# Patient Record
Sex: Male | Born: 1935 | Race: Black or African American | Hispanic: No | Marital: Married | Smoking: Never smoker
Health system: Southern US, Community
[De-identification: ages and names within clinical notes are randomized; demographics above are authoritative.]

## PROBLEM LIST (undated history)

## (undated) DIAGNOSIS — I10 Essential (primary) hypertension: Secondary | ICD-10-CM

## (undated) DIAGNOSIS — E785 Hyperlipidemia, unspecified: Secondary | ICD-10-CM

## (undated) DIAGNOSIS — Z7902 Long term (current) use of antithrombotics/antiplatelets: Secondary | ICD-10-CM

## (undated) DIAGNOSIS — I059 Rheumatic mitral valve disease, unspecified: Secondary | ICD-10-CM

## (undated) DIAGNOSIS — I499 Cardiac arrhythmia, unspecified: Secondary | ICD-10-CM

## (undated) DIAGNOSIS — I2510 Atherosclerotic heart disease of native coronary artery without angina pectoris: Secondary | ICD-10-CM

## (undated) DIAGNOSIS — C61 Malignant neoplasm of prostate: Secondary | ICD-10-CM

## (undated) DIAGNOSIS — G5600 Carpal tunnel syndrome, unspecified upper limb: Secondary | ICD-10-CM

## (undated) DIAGNOSIS — M1990 Unspecified osteoarthritis, unspecified site: Secondary | ICD-10-CM

## (undated) DIAGNOSIS — M109 Gout, unspecified: Secondary | ICD-10-CM

## (undated) DIAGNOSIS — N189 Chronic kidney disease, unspecified: Secondary | ICD-10-CM

## (undated) DIAGNOSIS — R911 Solitary pulmonary nodule: Secondary | ICD-10-CM

## (undated) HISTORY — PX: PROSTATE BIOPSY: SHX241

## (undated) HISTORY — DX: Cardiac arrhythmia, unspecified: I49.9

## (undated) HISTORY — DX: Malignant neoplasm of prostate: C61

## (undated) HISTORY — DX: Atherosclerotic heart disease of native coronary artery without angina pectoris: I25.10

## (undated) HISTORY — DX: Rheumatic mitral valve disease, unspecified: I05.9

## (undated) HISTORY — DX: Essential (primary) hypertension: I10

## (undated) HISTORY — DX: Gout, unspecified: M10.9

## (undated) HISTORY — DX: Long term (current) use of antithrombotics/antiplatelets: Z79.02

## (undated) HISTORY — PX: COLONOSCOPY: SHX174

## (undated) HISTORY — PX: CORONARY ANGIOPLASTY WITH STENT PLACEMENT: SHX49

## (undated) HISTORY — DX: Chronic kidney disease, unspecified: N18.9

## (undated) HISTORY — DX: Hyperlipidemia, unspecified: E78.5

## (undated) HISTORY — DX: Carpal tunnel syndrome, unspecified upper limb: G56.00

## (undated) HISTORY — DX: Unspecified osteoarthritis, unspecified site: M19.90

---

## 2004-05-22 ENCOUNTER — Other Ambulatory Visit: Payer: Self-pay

## 2004-05-22 ENCOUNTER — Emergency Department: Payer: Self-pay | Admitting: Emergency Medicine

## 2006-12-31 ENCOUNTER — Ambulatory Visit: Payer: Self-pay | Admitting: Unknown Physician Specialty

## 2010-02-15 ENCOUNTER — Ambulatory Visit: Payer: Self-pay | Admitting: Internal Medicine

## 2010-02-15 ENCOUNTER — Emergency Department: Payer: Self-pay | Admitting: Emergency Medicine

## 2013-07-08 ENCOUNTER — Ambulatory Visit: Payer: Self-pay | Admitting: Internal Medicine

## 2013-07-08 LAB — CK TOTAL AND CKMB (NOT AT ARMC): CK, Total: 137 U/L (ref 35–232)

## 2013-07-09 LAB — BASIC METABOLIC PANEL
Anion Gap: 6 — ABNORMAL LOW (ref 7–16)
BUN: 12 mg/dL (ref 7–18)
Calcium, Total: 8.3 mg/dL — ABNORMAL LOW (ref 8.5–10.1)
Creatinine: 1.09 mg/dL (ref 0.60–1.30)
EGFR (African American): >60
EGFR (Non-African Amer.): >60
Glucose: 89 mg/dL (ref 65–99)
Osmolality: 277 (ref 275–301)

## 2013-12-11 DIAGNOSIS — G5620 Lesion of ulnar nerve, unspecified upper limb: Secondary | ICD-10-CM | POA: Insufficient documentation

## 2013-12-11 DIAGNOSIS — G5600 Carpal tunnel syndrome, unspecified upper limb: Secondary | ICD-10-CM | POA: Insufficient documentation

## 2013-12-11 DIAGNOSIS — G609 Hereditary and idiopathic neuropathy, unspecified: Secondary | ICD-10-CM | POA: Insufficient documentation

## 2013-12-11 DIAGNOSIS — I059 Rheumatic mitral valve disease, unspecified: Secondary | ICD-10-CM

## 2013-12-11 DIAGNOSIS — I10 Essential (primary) hypertension: Secondary | ICD-10-CM | POA: Insufficient documentation

## 2013-12-11 DIAGNOSIS — M1990 Unspecified osteoarthritis, unspecified site: Secondary | ICD-10-CM | POA: Insufficient documentation

## 2013-12-11 HISTORY — DX: Rheumatic mitral valve disease, unspecified: I05.9

## 2013-12-11 HISTORY — DX: Essential (primary) hypertension: I10

## 2014-01-27 DIAGNOSIS — Z7902 Long term (current) use of antithrombotics/antiplatelets: Secondary | ICD-10-CM | POA: Insufficient documentation

## 2014-01-27 DIAGNOSIS — I2510 Atherosclerotic heart disease of native coronary artery without angina pectoris: Secondary | ICD-10-CM

## 2014-01-27 HISTORY — DX: Atherosclerotic heart disease of native coronary artery without angina pectoris: I25.10

## 2014-07-29 DIAGNOSIS — E785 Hyperlipidemia, unspecified: Secondary | ICD-10-CM | POA: Insufficient documentation

## 2014-11-12 ENCOUNTER — Encounter: Payer: Self-pay | Admitting: Radiation Oncology

## 2014-11-12 NOTE — Progress Notes (Signed)
GU Location of Tumor / Histology: prostatic adenocarcinoma  If Prostate Cancer, Gleason Score is (3 + 3) and PSA is (6.91)  Campbell Lerner presented February 2016 with an elevated PSA.  Biopsies of prostate (if applicable) revealed:    Past/Anticipated interventions by urology, if any: biopsy; encouraged active surveillance. Referred to radiation oncology.  Past/Anticipated interventions by medical oncology, if any: no  Weight changes, if any: no  Bowel/Bladder complaints, if any: nocturia   Nausea/Vomiting, if any: no  Pain issues, if any:  no  SAFETY ISSUES:  Prior radiation? no  Pacemaker/ICD? no  Possible current pregnancy? no  Is the patient on methotrexate? no  Current Complaints / other details:  81 year old male. Prostate volume 74 cc. "Five family members have died of cancer." NKDA. Low risk disease. [place] encouraged active surveillance but, patient concerned about spread.

## 2014-11-14 DIAGNOSIS — C61 Malignant neoplasm of prostate: Secondary | ICD-10-CM | POA: Insufficient documentation

## 2014-11-14 NOTE — Progress Notes (Signed)
Radiation Oncology         [phone] ________________________________  Initial outpatient Consultation  Name: Andrew [name] MRN: [id number]  Date: 11/15/2014  DOB: [birth year]  PQ:[id number], Andrew Rinks, MD  Andrew Rhodes, MD   REFERRING PHYSICIAN: [name], MD  DIAGNOSIS: 81 y.o. gentleman with stage T1c adenocarcinoma of the prostate with a Gleason's score of 3+3 and a PSA of 6.91    ICD-9-CM ICD-10-CM   1. Malignant neoplasm of prostate Andrew [name] is a 81 y.o. gentleman.  He was noted to have an elevated PSA of 6.91 by his primary care physician and urologist in [place].  He was encouraged to consider ongoing surveillance, but, due to a strong family history of cancer he wanted a second opinion.  Accordingly, he was was seen for evaluation in urology by Dr. Karsten Ro and  digital rectal examination was performed at that time revealed no nodules.  The patient proceeded to transrectal ultrasound with 12 biopsies of the prostate on 10/13/14.  The prostate volume measured 74 cc.  Out of 12 core biopsies, 2 were positive.  The maximum Gleason score was 3+3, and this was seen in 5% and less than 5% of the left base and right apex respectively.  The patient reviewed the biopsy results with his urologist and he has kindly been referred today for discussion of potential radiation treatment options.  PREVIOUS RADIATION THERAPY: No  PAST MEDICAL HISTORY:  has a past medical history of Prostate cancer; Encounter for long-term (current) use of antiplatelets/antithrombotics; Coronary atherosclerosis of native coronary artery; Carpal tunnel syndrome; Gout; Hyperlipidemia; Hypertension; Mitral valve disorder; and Osteoarthritis.    PAST SURGICAL HISTORY: Past Surgical History  Procedure Laterality Date  . Coronary angioplasty with stent placement    . Colonoscopy    . Prostate biopsy      FAMILY HISTORY: family history includes Cancer in his mother and  sister.  SOCIAL HISTORY:  reports that he has never smoked. He has never used smokeless tobacco. He reports that he does not drink alcohol or use illicit drugs.  ALLERGIES: Review of patient's allergies indicates no known allergies.  MEDICATIONS:  Current Outpatient Prescriptions  Medication Sig Dispense Refill  . aspirin 81 MG tablet Take 325 mg by mouth daily.     . naproxen sodium (ANAPROX) 220 MG tablet Take 220 mg by mouth 2 (two) times daily with a meal.     No current facility-administered medications for this encounter.    REVIEW OF SYSTEMS:  A 15 point review of systems is documented in the electronic medical record. This was obtained by the nursing staff. However, I reviewed this with the patient to discuss relevant findings and make appropriate changes.  A comprehensive review of systems was negative..  The patient completed an IPSS and IIEF questionnaire.  His IPSS score was 13 indicating moderate urinary outflow obstructive symptoms.  He indicated that his erectile function is is able to complete sexual activity on most attempts.   PHYSICAL EXAM: This patient is in no acute distress.  He is alert and oriented.   height is 5\' 6"  (1.676 m) and weight is 169 lb 9.6 oz (76.93 kg). His oral temperature is 98 F (36.7 C). His blood pressure is 152/65 and his pulse is 65. His respiration is 16 and oxygen saturation is 100%.  He exhibits no respiratory distress or labored breathing.  He appears neurologically intact.  His mood is pleasant.  His  affect is appropriate.  Please note the digital rectal exam findings described above.  KPS = 100  100 - Normal; no complaints; no evidence of disease. 90   - Able to carry on normal activity; minor signs or symptoms of disease. 80   - Normal activity with effort; some signs or symptoms of disease. 50   - Cares for self; unable to carry on normal activity or to do active work. 60   - Requires occasional assistance, but is able to care for most of  his personal needs. 50   - Requires considerable assistance and frequent medical care. 53   - Disabled; requires special care and assistance. 33   - Severely disabled; hospital admission is indicated although death not imminent. 59   - Very sick; hospital admission necessary; active supportive treatment necessary. 10   - Moribund; fatal processes progressing rapidly. 0     - Dead  Karnofsky DA, Abelmann WH, Craver LS and Burchenal JH [phone]) The use of the nitrogen mustards in the palliative treatment of carcinoma: with particular reference to bronchogenic carcinoma Cancer 1 634-56   LABORATORY DATA:  No results found for: WBC, HGB, HCT, MCV, PLT No results found for: NA, K, CL, CO2 No results found for: ALT, AST, GGT, ALKPHOS, BILITOT   RADIOGRAPHY: No results found.    IMPRESSION: This gentleman is a 81 y.o. gentleman with stage T1c adenocarcinoma of the prostate with a Gleason's score of 3+3 and a PSA of 6.91.  His T-Stage, Gleason's Score, and PSA put him into the favorable risk group.  Accordingly he is eligible for a variety of potential treatment options including active surveillance, which may be the preferred approach versus radiotherapy with IMRT or seed implant.  He is not an ideal seed candidate given his 77 cc gland.  PLAN:Today I reviewed the findings and workup thus far.  We discussed the natural history of prostate cancer.  We reviewed the the implications of T-stage, Gleason's Score, and PSA on decision-making and outcomes in prostate cancer.  We discussed radiation treatment in the management of prostate cancer with regard to the logistics and delivery of external beam radiation treatment as well as the logistics and delivery of prostate brachytherapy.  We compared and contrasted each of these approaches and also compared these against prostatectomy.  The patient expressed interest in ongoing surveillance with possible Proscar or Avodart therapy.  If he elects to pursue treatment  later, he may be interested in prostate brachytherapy warranting some downsizing of the gland.   The patient would like to proceed with possible 5-ARI therapy now and follow-up with Dr. Karsten Ro.  I will share my findings with Dr. Karsten Ro.  I enjoyed meeting with him today, and will look forward to participating in the care of this very nice gentleman.   I spent 60 minutes face to face with the patient and more than 50% of that time was spent in counseling and/or coordination of care.   ------------------------------------------------  Sheral Apley. Tammi Klippel, M.D.

## 2014-11-15 ENCOUNTER — Encounter: Payer: Self-pay | Admitting: Radiation Oncology

## 2014-11-15 ENCOUNTER — Ambulatory Visit
Admission: RE | Admit: 2014-11-15 | Discharge: 2014-11-15 | Disposition: A | Discharge status: Home / Self Care | Payer: Medicare Other | Source: Ambulatory Visit | Attending: Radiation Oncology | Admitting: Radiation Oncology

## 2014-11-15 VITALS — BP 152/65 | HR 65 | Temp 98.0°F | Resp 16 | Ht 66.0 in | Wt 169.6 lb

## 2014-11-15 DIAGNOSIS — C61 Malignant neoplasm of prostate: Secondary | ICD-10-CM

## 2014-11-15 NOTE — Progress Notes (Signed)
See progress note under physician encounter. 

## 2014-11-15 NOTE — Progress Notes (Signed)
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 Has a significant other but, they reside separately. Reports his nephew is receiving chemotherapy for stage 4 prostate cancer. Also, he reports his niece died of cancer. IPSS 13. Biggest complaint is nocturia. Vitals stable. Reports taking advil, aspirin, two bp medications and a cholesterol med. Patient unsure of the name of the bp and cholesterol med.

## 2015-05-16 ENCOUNTER — Encounter: Payer: Self-pay | Admitting: Radiation Oncology

## 2015-05-16 ENCOUNTER — Ambulatory Visit: Payer: Medicare Other

## 2015-05-16 ENCOUNTER — Ambulatory Visit
Admission: RE | Admit: 2015-05-16 | Discharge: 2015-05-16 | Disposition: A | Discharge status: Home / Self Care | Payer: Medicare Other | Source: Ambulatory Visit | Attending: Radiation Oncology | Admitting: Radiation Oncology

## 2015-05-16 ENCOUNTER — Telehealth: Payer: Self-pay | Admitting: Radiation Oncology

## 2015-05-16 ENCOUNTER — Ambulatory Visit: Admission: RE | Admit: 2015-05-16 | Payer: Medicare Other | Source: Ambulatory Visit | Admitting: Radiation Oncology

## 2015-05-16 NOTE — Progress Notes (Signed)
GU Location of Tumor / Histology: prostatic adenocarcinoma  If Prostate Cancer, Gleason Score is (3 + 3) and PSA is (6.91)  Campbell Lerner presented February 2016 with an elevated PSA.  Biopsies of prostate (if applicable) revealed:    Past/Anticipated interventions by urology, if any: biopsy; encouraged active surveillance. Referred to radiation oncology.  Past/Anticipated interventions by medical oncology, if any: no  Weight changes, if any: no  Bowel/Bladder complaints, if any: nocturia   Nausea/Vomiting, if any: no  Pain issues, if any: no  SAFETY ISSUES:  Prior radiation? no  Pacemaker/ICD? no  Possible current pregnancy? no  Is the patient on methotrexate? no  Current Complaints / other details: 81 year old male. Prostate volume 74 cc. "Five family members have died of cancer." NKDA. Low risk disease.   Consulted by Dr. Tammi Klippel on 11/15/14. Returned to [place] for finasteride (started on 01/28/15). Prostate volume now reduced from 74 cc to 60 cc. Referred back to Dr. Tammi Klippel for consideration of seed implantation.

## 2015-05-16 NOTE — Telephone Encounter (Signed)
Patient has not shown for 1230 appointment. Phoned home number. No answer. Left message requesting return call to reschedule.

## 2015-05-17 ENCOUNTER — Encounter: Payer: Self-pay | Admitting: *Deleted

## 2015-05-17 ENCOUNTER — Telehealth: Payer: Self-pay | Admitting: *Deleted

## 2015-05-17 NOTE — Telephone Encounter (Signed)
Mailed no show letter today

## 2015-06-29 ENCOUNTER — Encounter: Payer: Self-pay | Admitting: *Deleted

## 2015-06-29 DIAGNOSIS — K635 Polyp of colon: Secondary | ICD-10-CM | POA: Insufficient documentation

## 2015-07-01 ENCOUNTER — Ambulatory Visit (INDEPENDENT_AMBULATORY_CARE_PROVIDER_SITE_OTHER): Payer: Medicare Other | Admitting: Obstetrics and Gynecology

## 2015-07-01 ENCOUNTER — Encounter: Payer: Self-pay | Admitting: Obstetrics and Gynecology

## 2015-07-01 VITALS — BP 158/84 | HR 63 | Ht 69.0 in | Wt 167.2 lb

## 2015-07-01 DIAGNOSIS — R972 Elevated prostate specific antigen [PSA]: Secondary | ICD-10-CM

## 2015-07-01 NOTE — Progress Notes (Signed)
07/01/2015 11:12 AM   Campbell Lerner 1933-07-03 [id number]  Referring provider: [name], MD [address] [facility] [place], [place] [address]  Chief Complaint  Patient presents with  . Elevated PSA    38yr f/u    HPI: Patient is a 81 year old African-American male presenting today for scheduled recheck on elevated PSA and BPH. His previous PSAs with [place] were stable at 5.8. He was last seen 1 year ago. He states that he followed up with his primary care provider after his last visit and his PSA was rechecked. It was found to be higher than his previous and he was referred to [facility] where he saw Dr. Adah Salvage. A prostate biopsy was performed positive for low risk prostate cancer. Per Dr. Adah Salvage notes patient was referred to radiation oncology.  Patient states that he went on vacation to [place] and has not been in touch with [facility] over the last month. Patient was supposed to follow up with radiation oncology but states that he has been in [place] and has not heard anything about appointment.  During our conversation it seems as though active surveillance has been discussed with patient in the past but he has high anxiety concerning the possible development of metastatic disease because his nephew has stage IV prostate cancer.  He states that he cannot understand highly would delay treatment mainly noted that there is cancer there.  10/13/14 TRUS/BX Prostate measured 74cc with no definite abnormality noted Pathology: Adenocarcinoma Gleason 3+3=6 in 2 cores 5% & <5% (low risk).  Stage T1c Prostate downsizing: started on Finasteride 01/28/15.  PSA History: 2/15      PSA 5.9 7/15      PSA 5.9  11/15    PSA 5.8  09/10/14 PSA 6.91 Started Finasteride 05/03/15 PSA 4.59  PMH: Past Medical History  Diagnosis Date  . Prostate cancer ([place])   . Encounter for long-term (current) use of antiplatelets/antithrombotics   . Coronary atherosclerosis of native coronary  artery   . Carpal tunnel syndrome   . Gout   . Hyperlipidemia   . Hypertension   . Mitral valve disorder   . Osteoarthritis     Surgical History: Past Surgical History  Procedure Laterality Date  . Coronary angioplasty with stent placement    . Colonoscopy    . Prostate biopsy      Home Medications:    Medication List       This list is accurate as of: 07/01/15 11:12 AM.  Always use your most recent med list.               amLODipine 10 MG tablet  Commonly known as:  NORVASC  TAKE 1 TABLET (10 MG TOTAL) BY MOUTH ONCE DAILY.     aspirin 81 MG tablet  Take 325 mg by mouth daily.     atorvastatin 40 MG tablet  Commonly known as:  LIPITOR  Take 40 mg by mouth daily.     clopidogrel 75 MG tablet  Commonly known as:  PLAVIX  Take 75 mg by mouth daily.     finasteride 5 MG tablet  Commonly known as:  PROSCAR  Take 5 mg by mouth daily.     metoprolol 50 MG tablet  Commonly known as:  LOPRESSOR  Take 50 mg by mouth 2 (two) times daily.     naproxen sodium 220 MG tablet  Commonly known as:  ANAPROX  Take 220 mg by mouth 2 (two) times daily with  a meal.        Allergies: No Known Allergies  Family History: Family History  Problem Relation Age of Onset  . Cancer Mother     stomach and uterus  . Cancer Sister     breast    Social History:  reports that he has never smoked. He has never used smokeless tobacco. He reports that he does not drink alcohol or use illicit drugs.  ROS: UROLOGY Frequent Urination?: No Hard to postpone urination?: No Burning/pain with urination?: No Get up at night to urinate?: Yes Leakage of urine?: No Urine stream starts and stops?: No Trouble starting stream?: No Do you have to strain to urinate?: No Blood in urine?: No Urinary tract infection?: No Sexually transmitted disease?: No Injury to kidneys or bladder?: No Painful intercourse?: No Weak stream?: No Erection problems?: Yes Penile pain?:  No  Gastrointestinal Nausea?: No Vomiting?: No Indigestion/heartburn?: No Diarrhea?: No Constipation?: No  Constitutional Fever: No Night sweats?: No Weight loss?: Yes Fatigue?: No  Skin Skin rash/lesions?: No Itching?: No  Eyes Blurred vision?: No Double vision?: No  Ears/Nose/Throat Sore throat?: No Sinus problems?: No  Hematologic/Lymphatic Swollen glands?: No Easy bruising?: No  Cardiovascular Leg swelling?: No Chest pain?: No  Respiratory Cough?: No Shortness of breath?: No  Endocrine Excessive thirst?: No  Musculoskeletal Back pain?: No Joint pain?: No  Neurological Headaches?: No Dizziness?: No  Psychologic Depression?: No Anxiety?: No  Physical Exam: BP 158/84 mmHg  Pulse 63  Ht 5\' 9"  (1.753 m)  Wt 167 lb 3.2 oz (75.841 kg)  BMI 24.68 kg/m2  Constitutional:  Alert and oriented, No acute distress. HEENT: [place] AT, moist mucus membranes.  Trachea midline, no masses. Cardiovascular: No clubbing, cyanosis, or edema. Respiratory: Normal respiratory effort, no increased work of breathing. Skin: No rashes, bruises or suspicious lesions. Lymph: No cervical adenopathy. Neurologic: Grossly intact, no focal deficits, moving all 4 extremities. Psychiatric: Normal mood and affect.  Laboratory Data:   Urinalysis No results found for: COLORURINE, APPEARANCEUR, LABSPEC, PHURINE, GLUCOSEU, HGBUR, BILIRUBINUR, KETONESUR, PROTEINUR, UROBILINOGEN, NITRITE, LEUKOCYTESUR  Pertinent Imaging:   Assessment & Plan:    1. Prostate Cancer- Adenocarcinoma Gleason 3+3=6 in 2 cores 5% & <5% (low risk).  Stage T1c  PSA history  2/15      PSA 5.9  7/15      PSA 5.9   11/15    PSA 5.8   09/10/14 PSA 6.91 Started Finasteride  05/03/15 PSA 4.59 - PSA  I further discussed with patient that his cancer is considered low risk. He continues to have difficulty understanding the option of active surveillance.  I advised him that we would be happy to assist him with  treatment of his prostate cancer but that I would like him to further discuss this with Dr. Erlene Quan so that he completely understands the risk and benefits of treatment. He is also willing to follow up at the [facility] as needed for radiation oncology consultation.  He was provided patient information concerning prostate cancer and potential treatment options.  2. BPH- No urinary symptoms.  Return for f/u with Dr. Erlene Quan to discuss prostate cancer treatment options.  These notes generated with voice recognition software. I apologize for typographical errors.  Herbert Moors, [facility] [address] [place],  [address] [phone]

## 2015-07-02 LAB — PSA: PROSTATE SPECIFIC AG, SERUM: 3 ng/mL (ref 0.0–4.0)

## 2015-07-18 ENCOUNTER — Ambulatory Visit (INDEPENDENT_AMBULATORY_CARE_PROVIDER_SITE_OTHER): Payer: Medicare Other | Admitting: Urology

## 2015-07-18 ENCOUNTER — Encounter: Payer: Self-pay | Admitting: Urology

## 2015-07-18 VITALS — BP 184/89 | HR 54 | Ht 69.0 in | Wt 167.6 lb

## 2015-07-18 DIAGNOSIS — C61 Malignant neoplasm of prostate: Secondary | ICD-10-CM

## 2015-07-18 DIAGNOSIS — N400 Enlarged prostate without lower urinary tract symptoms: Secondary | ICD-10-CM | POA: Diagnosis not present

## 2015-07-18 NOTE — Progress Notes (Signed)
1:30 PM  07/18/2015  CAYNE KLESS 1932-10-01 [id number]  Referring provider: [name], MD [address] [facility] [place], [place] [address]  Chief Complaint  Patient presents with  . Follow-up    discuss treatment Prostate Cancer     HPI: 81 year old African-American male with low volume, low risk prostate cancer. He underwent prostate biopsy by Dr. Karsten Ro [facility] on 10/13/2014 revealing Gleason 3+3 involving 2 of 12 cores (up to 5%).  PSA history as below. No palpable nodule on exam, TRUS vol 74 cc (prior to starting finasteride).  Since his diagnosis, he has seen radiation oncology (Dr. Tammi Klippel) and our NP Lanier Clam along with Dr. Karsten Ro who will continue to recommend active surveillance is the best treatment course/ preferred approach.  He returns today to our office for yet another opinion.  He is extremely anxious about his prostate cancer diagnosis and continues to want intervention/treatment despite the recommendations for active surveillance.  He explains that he has many family members with malignancies other than prostate cancer and is concerned cancer runs in his family. He does have a nephew with prostate cancer.  He states today that he plans on living for very long time and does indeed have an excellent performance status.   10/13/14 TRUS/BX Prostate measured 74cc with no definite abnormality noted Pathology: Adenocarcinoma Gleason 3+3=6 in 2 cores 5% & <5% (low risk).  Stage T1c Prostate downsizing: started on Finasteride 01/28/15.  PSA History: 2/15      PSA 5.9 7/15      PSA 5.9  11/15    PSA 5.8  09/10/14 PSA 6.91 Started Finasteride 05/03/15 PSA 4.59 11/2016PSA 3.0  PMH: Past Medical History  Diagnosis Date  . Prostate cancer ([place])   . Encounter for long-term (current) use of antiplatelets/antithrombotics   . Coronary atherosclerosis of native coronary artery   . Carpal tunnel syndrome   . Gout   . Hyperlipidemia   .  Hypertension   . Mitral valve disorder   . Osteoarthritis   . Arteriosclerosis of coronary artery 01/27/2014  . Benign essential HTN 12/11/2013  . Disorder of mitral valve 12/11/2013    Surgical History: Past Surgical History  Procedure Laterality Date  . Coronary angioplasty with stent placement    . Colonoscopy    . Prostate biopsy      Home Medications:    Medication List       This list is accurate as of: 07/18/15  1:30 PM.  Always use your most recent med list.               amLODipine 10 MG tablet  Commonly known as:  NORVASC  TAKE 1 TABLET (10 MG TOTAL) BY MOUTH ONCE DAILY.     aspirin 81 MG tablet  Take 325 mg by mouth daily.     atorvastatin 40 MG tablet  Commonly known as:  LIPITOR  Take 40 mg by mouth daily.     clopidogrel 75 MG tablet  Commonly known as:  PLAVIX  Take 75 mg by mouth daily.     finasteride 5 MG tablet  Commonly known as:  PROSCAR  Take 5 mg by mouth daily.     metoprolol 50 MG tablet  Commonly known as:  LOPRESSOR  Take 50 mg by mouth 2 (two) times daily.     naproxen sodium 220 MG tablet  Commonly known as:  ANAPROX  Take 220 mg by mouth 2 (two) times daily with a meal.  Allergies: No Known Allergies  Family History: Family History  Problem Relation Age of Onset  . Cancer Mother     stomach and uterus  . Cancer Sister     breast    Social History:  reports that he has never smoked. He has never used smokeless tobacco. He reports that he does not drink alcohol or use illicit drugs.  ROS: UROLOGY Frequent Urination?: No Hard to postpone urination?: No Burning/pain with urination?: No Get up at night to urinate?: Yes Leakage of urine?: No Urine stream starts and stops?: No Trouble starting stream?: No Do you have to strain to urinate?: No Blood in urine?: No Urinary tract infection?: No Sexually transmitted disease?: No Injury to kidneys or bladder?: No Painful intercourse?: No Weak stream?: No Erection  problems?: Yes Penile pain?: No  Gastrointestinal Nausea?: No Vomiting?: No Indigestion/heartburn?: No Diarrhea?: No Constipation?: No  Constitutional Fever: No Night sweats?: No Weight loss?: Yes Fatigue?: No  Skin Skin rash/lesions?: No Itching?: No  Eyes Blurred vision?: Yes Double vision?: No  Ears/Nose/Throat Sore throat?: No Sinus problems?: No  Hematologic/Lymphatic Swollen glands?: No Easy bruising?: No  Cardiovascular Leg swelling?: No Chest pain?: No  Respiratory Cough?: No Shortness of breath?: No  Endocrine Excessive thirst?: No  Musculoskeletal Back pain?: No Joint pain?: No  Neurological Headaches?: No Dizziness?: No  Psychologic Depression?: No Anxiety?: No  Physical Exam: BP 184/89 mmHg  Pulse 54  Ht 5\' 9"  (1.753 m)  Wt 167 lb 9.6 oz (76.023 kg)  BMI 24.74 kg/m2  Constitutional:  Alert and oriented, No acute distress. HEENT: [place] AT, moist mucus membranes.  Trachea midline, no masses. Cardiovascular: No clubbing, cyanosis, or edema. Respiratory: Normal respiratory effort, no increased work of breathing. Skin: No rashes, bruises or suspicious lesions. Neurologic: Grossly intact, no focal deficits, moving all 4 extremities. Psychiatric: Normal mood and affect. Rectal exam deferred today, performed just a few months ago at [facility]   Assessment & Plan:    1. Prostate Cancer: 81 year old male diagnosed on 10/2014 with very low risk prostate cancer, Gleason 3+3 involving only to 4 courses very low volume, T1c in 74 cc prostate.  His PSA has come down appropriately with the addition of finasteride.  We had a lengthy discussion today regarding the natural history of prostate cancer. I explained to him that he is not a surgical candidate given his age and likelihood of developing severe incontinence, erectile dysfunction, and other major complications.  He may be a candidate for radiation including external beam and now  possibly seed implant if his prostate size has responded to the finasteride.    I discussed that I highly recommend active surveillance is the preferred treatment and agree with all other consultants in this regard. I do feel that given his very low volume of disease, that it is clinically insignificant and can be easily followed. I explained that treatment will likely be more harmful than the cancer itself and per the Hippocratic oath, have pledged to first do no harm.    He continues to feel that he would like to be treated despite our recommendations. I will send him for a second opinion with radiation oncology with Dr. Noreene Filbert.  -referral to radiation oncology -RTC for PSA/ DRE q4-6 months per active surveillance protocol  2. BPH- No urinary symptoms.  Return in about 6 months (around 01/16/2016) for PSA/ DRE.   Hollice Espy, MD  [facility] [address], [place], [place] [address] [phone]  [phone]   

## 2015-07-27 ENCOUNTER — Ambulatory Visit: Payer: Medicare Other | Admitting: Radiation Oncology

## 2015-08-09 ENCOUNTER — Telehealth: Payer: Self-pay

## 2015-08-09 NOTE — Telephone Encounter (Signed)
  Oncology Nurse Navigator Documentation    Navigator Encounter Type: Telephone (08/09/15 1300)                      Time Spent with Patient: 15 (08/09/15 1300)   Left message for patient to return call. Has radiation referral and has not returned call for appt.

## 2015-08-11 ENCOUNTER — Telehealth: Payer: Self-pay

## 2015-08-11 NOTE — Telephone Encounter (Signed)
  Oncology Nurse Navigator Documentation    Navigator Encounter Type: Telephone (08/11/15 1000) Patient Visit Type: [id number] (08/11/15 1000)       Interventions: Coordination of Care (08/11/15 1000)            Time Spent with Patient: 15 (08/11/15 1000)   Andrew [name] returned call and appt for consultation with Dr Baruch Gouty was arranged for 08/16/15 at 10:00. Directions to [name] provided as well as my contact information for any further questions or concerns.

## 2015-08-16 ENCOUNTER — Ambulatory Visit
Admission: RE | Admit: 2015-08-16 | Discharge: 2015-08-16 | Disposition: A | Discharge status: Home / Self Care | Payer: Medicare Other | Source: Ambulatory Visit | Attending: Radiation Oncology | Admitting: Radiation Oncology

## 2015-08-16 ENCOUNTER — Encounter: Payer: Self-pay | Admitting: Radiation Oncology

## 2015-08-16 VITALS — BP 143/75 | HR 64 | Temp 96.7°F | Resp 18 | Wt 165.9 lb

## 2015-08-16 DIAGNOSIS — C61 Malignant neoplasm of prostate: Secondary | ICD-10-CM

## 2015-08-16 NOTE — Consult Note (Signed)
Except an outstanding is perfect of Radiation Oncology NEW PATIENT EVALUATION  Name: Andrew [name]  MRN: [id number]  Date:   08/16/2015     DOB: 18-Jul-1933   This 82 y.o. male patient presents to the clinic for initial evaluation of prostate cancer stage I (T1 CN 0 M0) Gleason 6 (3+3) adenocarcinoma the prostate.  REFERRING PHYSICIAN: [name], MD  CHIEF COMPLAINT:  Chief Complaint  Patient presents with  . Prostate Cancer    Pt is here for initial consultation of prostate cancer.      DIAGNOSIS: The encounter diagnosis was Malignant neoplasm of prostate ([place]).   PREVIOUS INVESTIGATIONS:  Pathology report reviewed Bone scan not performed based on low PSA score Clinical notes reviewed  HPI: Patient is a 82 year old male who is had an elevated PSA dating back to early 2015 what was 5.9. In March 2016 he underwent transrectal ultrasound-guided biopsy showing 2 out of 12 cores positive for Gleason 6 (3+3) adenocarcinoma. He has no palpable nodule. He was started on finasteride PSA dropped to the 3 range. He is seen both radiation oncology and urology and recommendation for active surveillance was made. Patient is an excellent overall general condition has multiple family members with cancer and nephew now dealing with stage IV prostate cancer. He has very little symptoms specifically denies urinary frequency urgency or dysuria. I been asked to evaluate the patient for consideration of treatment. He has approximate 74 cc prostate.  PLANNED TREATMENT REGIMEN: I MRT image guided radiation therapy  PAST MEDICAL HISTORY:  has a past medical history of Prostate cancer ([place]); Encounter for long-term (current) use of antiplatelets/antithrombotics; Coronary atherosclerosis of native coronary artery; Carpal tunnel syndrome; Gout; Hyperlipidemia; Hypertension; Mitral valve disorder; Osteoarthritis; Arteriosclerosis of coronary artery (01/27/2014); Benign essential HTN (12/11/2013); and Disorder of  mitral valve (12/11/2013).    PAST SURGICAL HISTORY:  Past Surgical History  Procedure Laterality Date  . Coronary angioplasty with stent placement    . Colonoscopy    . Prostate biopsy      FAMILY HISTORY: family history includes Cancer in his mother and sister.  SOCIAL HISTORY:  reports that he has never smoked. He has never used smokeless tobacco. He reports that he does not drink alcohol or use illicit drugs.  ALLERGIES: Review of patient's allergies indicates no known allergies.  MEDICATIONS:  Current Outpatient Prescriptions  Medication Sig Dispense Refill  . amLODipine (NORVASC) 10 MG tablet TAKE 1 TABLET (10 MG TOTAL) BY MOUTH ONCE DAILY.  11  . aspirin 81 MG tablet Take 325 mg by mouth daily.     Marland Kitchen atorvastatin (LIPITOR) 40 MG tablet Take 40 mg by mouth daily.  6  . clopidogrel (PLAVIX) 75 MG tablet Take 75 mg by mouth daily.  5  . finasteride (PROSCAR) 5 MG tablet Take 5 mg by mouth daily.  11  . hydrochlorothiazide (HYDRODIURIL) 25 MG tablet Take by mouth.    . metoprolol (LOPRESSOR) 50 MG tablet Take 50 mg by mouth 2 (two) times daily.  6  . naproxen sodium (ANAPROX) 220 MG tablet Take 220 mg by mouth 2 (two) times daily with a meal.    . sildenafil (REVATIO) 20 MG tablet Take by mouth.     No current facility-administered medications for this encounter.    ECOG PERFORMANCE STATUS:  0 - Asymptomatic  REVIEW OF SYSTEMS:  Patient denies any weight loss, fatigue, weakness, fever, chills or night sweats. Patient denies any loss of vision, blurred vision. Patient denies any ringing  of the ears or hearing loss. No irregular heartbeat. Patient denies heart murmur or history of fainting. Patient denies any chest pain or pain radiating to her upper extremities. Patient denies any shortness of breath, difficulty breathing at night, cough or hemoptysis. Patient denies any swelling in the lower legs. Patient denies any nausea vomiting, vomiting of blood, or coffee ground material in  the vomitus. Patient denies any stomach pain. Patient states has had normal bowel movements no significant constipation or diarrhea. Patient denies any dysuria, hematuria or significant nocturia. Patient denies any problems walking, swelling in the joints or loss of balance. Patient denies any skin changes, loss of hair or loss of weight. Patient denies any excessive worrying or anxiety or significant depression. Patient denies any problems with insomnia. Patient denies excessive thirst, polyuria, polydipsia. Patient denies any swollen glands, patient denies easy bruising or easy bleeding. Patient denies any recent infections, allergies or URI. Patient "s visual fields have not changed significantly in recent time.    PHYSICAL EXAM: BP 143/75 mmHg  Pulse 64  Temp(Src) 96.7 F (35.9 C)  Resp 18  Wt 165 lb 14.3 oz (75.25 kg) Well-developed well-nourished male looks much younger than stated age On rectal exam rectal sphincter tone is good. Prostate is smooth contracted without evidence of nodularity or mass. Sulcus is preserved bilaterally. No discrete nodularity is identified. No other rectal abnormalities are noted. Gland measures approximate 70 cc. Well-developed well-nourished patient in NAD. HEENT reveals PERLA, EOMI, discs not visualized.  Oral cavity is clear. No oral mucosal lesions are identified. Neck is clear without evidence of cervical or supraclavicular adenopathy. Lungs are clear to A&P. Cardiac examination is essentially unremarkable with regular rate and rhythm without murmur rub or thrill. Abdomen is benign with no organomegaly or masses noted. Motor sensory and DTR levels are equal and symmetric in the upper and lower extremities. Cranial nerves II through XII are grossly intact. Proprioception is intact. No peripheral adenopathy or edema is identified. No motor or sensory levels are noted. Crude visual fields are within normal range.  LABORATORY DATA: Pathology report reviewed     RADIOLOGY RESULTS: No films for review bone scan not ordered based on low PSA value   IMPRESSION: Stage I adenocarcinoma the prostate in 82 year old male  PLAN: At this time I again expressed the rationale for active surveillance. Based on his concerns about prostate cancer with family members. Large size of his gland and overall age excellent general condition I would recommend image guided I MRT radiation therapy. I would plan on delivering 8000 cGy over 8 weeks. Risks and benefits of radiation including increased lower urinary tract symptoms, possible diarrhea, possible alteration of blood counts, skin reaction fatigue all were explained in detail to the patient. I have asked urology to place gold fiduciary markers for daily image guided treatment. Patient again has adamantly reviewed rejected watchful waiting with active surveillance would like to proceed with treatment.  I would like to take this opportunity for allowing me to participate in the care of your patient.Armstead Peaks., MD

## 2015-08-16 NOTE — Progress Notes (Signed)
  Oncology Nurse Navigator Documentation    Navigator Encounter Type: Telephone (08/16/15 1100) Patient Visit Type: BSWHQP (08/16/15 1100)                    Time Spent with Patient: 15 (08/16/15 1100)   Met with Andrew [name] after he saw Dr Baruch Gouty. Introduced nurse navigator services and provided him with my contact information for any further questions or concerns

## 2015-08-31 ENCOUNTER — Telehealth: Payer: Self-pay

## 2015-08-31 ENCOUNTER — Encounter: Payer: Self-pay | Admitting: Urology

## 2015-08-31 ENCOUNTER — Other Ambulatory Visit: Payer: Medicare Other | Admitting: Urology

## 2015-08-31 NOTE — Telephone Encounter (Signed)
  Oncology Nurse Navigator Documentation    Navigator Encounter Type: Telephone (08/31/15 1100)                                          Time Spent with Patient: 15 (08/31/15 1100)   Radiation notified that Andrew [name] did not show up for his gold markers at [facility] today 08/31/15

## 2015-09-05 ENCOUNTER — Ambulatory Visit: Admission: RE | Admit: 2015-09-05 | Payer: Medicare Other | Source: Ambulatory Visit

## 2015-09-05 ENCOUNTER — Ambulatory Visit
Admission: RE | Admit: 2015-09-05 | Discharge: 2015-09-05 | Disposition: A | Discharge status: Home / Self Care | Payer: Medicare Other | Source: Ambulatory Visit | Attending: Radiation Oncology | Admitting: Radiation Oncology

## 2015-09-09 ENCOUNTER — Telehealth: Payer: Self-pay

## 2015-09-09 NOTE — Telephone Encounter (Signed)
  Oncology Nurse Navigator Documentation  Navigator Location: [facility] (09/09/15 1400) Navigator Encounter Type: Telephone (09/09/15 1400)                                          Time Spent with Patient: 15 (09/09/15 1400)   Attempted to call Andrew [name] to see if he would like to proceed with radiation treatment. No answer/no voicemail.

## 2016-01-20 ENCOUNTER — Ambulatory Visit: Payer: Medicare Other | Admitting: Urology

## 2016-01-20 ENCOUNTER — Telehealth: Payer: Self-pay

## 2016-01-20 NOTE — Telephone Encounter (Signed)
  Oncology Nurse Navigator Documentation  Navigator Location: [facility] (01/20/16 1300) Navigator Encounter Type: Telephone (01/20/16 1300) Telephone: Lahoma Crocker Call;Appt Confirmation/Clarification (01/20/16 1300)                                        Time Spent with Patient: 30 (01/20/16 1300)   Andrew [name] has missed his last two appts for his gold see marker placement for prostate cancer treatment that he wishes to pursue. Contacted Andrew [name] regarding and he states he not talked with anyone in the last 6 months. States he is still interested in having radiation treatment. Instructed that I would get [facility] to make him another appt and I would call him to double confirm his receipt of this appt.

## 2016-01-23 ENCOUNTER — Telehealth: Payer: Self-pay | Admitting: Urology

## 2016-01-23 ENCOUNTER — Encounter: Payer: Self-pay | Admitting: Urology

## 2016-01-23 NOTE — Telephone Encounter (Signed)
-----   Message from Clent Jacks, RN sent at 01/20/2016  1:54 PM EDT ----- Regarding: RE: No show for prostate cancer f/u He states he has not talked to anyone in 6 months. I told him we would get him scheduled again for marker placement and call him with appt. I will call him as well. I think everyone agreed with AS, but he wanted something done. Sharyn Lull could you get him scheduled? ----- Message -----    From: Hollice Espy, MD    Sent: 01/20/2016   9:15 AM      To: Clent Jacks, RN Subject: No show for prostate cancer f/u                This guy no showed today for prostate cancer f/u.  He also no showed in 08/2015 for gold seeds (which is honestly fine because I think he would be better on active surveillance).   I was hoping you could contact him and make sure that he is at least following with 1 of the many MDs he's seen for his cancer.  He does need f/u.  Hollice Espy, MD

## 2016-01-23 NOTE — Telephone Encounter (Signed)
Patient missed his 6 month follow up appt on 01-20-16 with dr. Erlene Quan

## 2016-01-23 NOTE — Telephone Encounter (Signed)
I have scheduled him for 03-02-16 @ 10:45 for and 11:00 appt. I have called and left him a message to call me back. I will mail out the instructions.  Sharyn Lull

## 2016-01-23 NOTE — Telephone Encounter (Signed)
-----   Message from Clent Jacks, RN sent at 01/23/2016 11:40 AM EDT ----- Regarding: RE: No show for prostate cancer f/u He wants to be scheduled again to have gold markers placed ----- Message -----    From: Benard Halsted    Sent: 01/23/2016   8:20 AM      To: Clent Jacks, RN, Hollice Espy, MD Subject: RE: No show for prostate cancer f/u            He no showed for his appointment with Dr Erlene Quan on 01-20-16. We had his follow up appt scheduled the last time he was here in Dec 2016. He was told to come back for a follow up visit in 6 months. That was last Friday. ----- Message -----    From: Clent Jacks, RN    Sent: 01/20/2016   1:54 PM      To: Darcella Cheshire, MD Subject: RE: No show for prostate cancer f/u            He states he has not talked to anyone in 6 months. I told him we would get him scheduled again for marker placement and call him with appt. I will call him as well. I think everyone agreed with AS, but he wanted something done. Sharyn Lull could you get him scheduled? ----- Message -----    From: Hollice Espy, MD    Sent: 01/20/2016   9:15 AM      To: Clent Jacks, RN Subject: No show for prostate cancer f/u                This guy no showed today for prostate cancer f/u.  He also no showed in 08/2015 for gold seeds (which is honestly fine because I think he would be better on active surveillance).   I was hoping you could contact him and make sure that he is at least following with 1 of the many MDs he's seen for his cancer.  He does need f/u.  Hollice Espy, MD

## 2016-01-24 NOTE — Telephone Encounter (Signed)
Called and left another message for the patient to cb to give him his appt information.    Sharyn Lull

## 2016-02-02 ENCOUNTER — Telehealth: Payer: Self-pay

## 2016-02-02 NOTE — Telephone Encounter (Signed)
  Oncology Nurse Navigator Documentation  Navigator Location: [facility] (02/02/16 0900) Navigator Encounter Type: Telephone (02/02/16 0900) Telephone: Lahoma Crocker Call;Appt Confirmation/Clarification (02/02/16 0900)                                        Time Spent with Patient: 15 (02/02/16 0900)   [facility] has been unable to reach Andrew [name] to notify him of his appt for markers. I attempted to call. No answer/no voicemail.

## 2016-02-06 ENCOUNTER — Telehealth: Payer: Self-pay

## 2016-02-06 NOTE — Telephone Encounter (Signed)
  Oncology Nurse Navigator Documentation  Navigator Location: [facility] (02/06/16 1000) Navigator Encounter Type: Telephone (02/06/16 1000) Telephone: Lahoma Crocker Call (02/06/16 1000)                                        Time Spent with Patient: 15 (02/06/16 1000)   Spoke to Andrew [name] on the phone. Notified him that [facility] has been trying to contact him to arrange appt for him. He stated he got letter in mail from them and that he will call them today to schedule appt. Reiterated with him that he will need to call them for an appt.

## 2016-03-02 ENCOUNTER — Other Ambulatory Visit: Payer: Medicare Other | Admitting: Urology

## 2016-05-02 ENCOUNTER — Encounter (INDEPENDENT_AMBULATORY_CARE_PROVIDER_SITE_OTHER): Payer: Self-pay

## 2016-06-04 ENCOUNTER — Encounter (INDEPENDENT_AMBULATORY_CARE_PROVIDER_SITE_OTHER): Payer: Self-pay | Admitting: Vascular Surgery

## 2016-06-04 ENCOUNTER — Ambulatory Visit (INDEPENDENT_AMBULATORY_CARE_PROVIDER_SITE_OTHER): Payer: Medicare Other | Admitting: Vascular Surgery

## 2016-06-04 VITALS — BP 158/95 | HR 93 | Resp 17 | Ht 69.0 in | Wt 156.0 lb

## 2016-06-04 DIAGNOSIS — I10 Essential (primary) hypertension: Secondary | ICD-10-CM

## 2016-06-04 DIAGNOSIS — M1990 Unspecified osteoarthritis, unspecified site: Secondary | ICD-10-CM | POA: Diagnosis not present

## 2016-06-04 DIAGNOSIS — I2510 Atherosclerotic heart disease of native coronary artery without angina pectoris: Secondary | ICD-10-CM

## 2016-06-04 DIAGNOSIS — I739 Peripheral vascular disease, unspecified: Secondary | ICD-10-CM

## 2016-06-04 NOTE — Progress Notes (Signed)
[facility] Admission History & Physical  MRN : [id number]  Andrew [name] is a 82 y.o. (1932/10/28) male who presents with chief complaint of  Chief Complaint  Patient presents with  . Follow-up  .  History of Present Illness:The patient returns to the office for followup and review of the noninvasive studies. There have been no interval changes in lower extremity symptoms. No interval shortening of the patient's claudication distance or development of rest pain symptoms. No new ulcers or wounds have occurred since the last visit.  There have been no significant changes to the patient's overall health care.  The patient denies amaurosis fugax or recent TIA symptoms. There are no recent neurological changes noted. The patient denies history of DVT, PE or superficial thrombophlebitis. The patient denies recent episodes of angina or shortness of breath.    Current Meds  Medication Sig  . amLODipine (NORVASC) 10 MG tablet TAKE 1 TABLET (10 MG TOTAL) BY MOUTH ONCE DAILY.  Marland Kitchen aspirin 81 MG tablet Take 325 mg by mouth daily.   Marland Kitchen atorvastatin (LIPITOR) 40 MG tablet Take 40 mg by mouth daily.  . clopidogrel (PLAVIX) 75 MG tablet Take 75 mg by mouth daily.  . finasteride (PROSCAR) 5 MG tablet Take 5 mg by mouth daily.  . hydrochlorothiazide (HYDRODIURIL) 25 MG tablet Take by mouth.  . meloxicam (MOBIC) 15 MG tablet Take 15 mg by mouth daily.  . metoprolol (LOPRESSOR) 50 MG tablet Take 50 mg by mouth 2 (two) times daily.  . naproxen sodium (ANAPROX) 220 MG tablet Take 220 mg by mouth 2 (two) times daily with a meal.  . sildenafil (REVATIO) 20 MG tablet Take by mouth.    Past Medical History:  Diagnosis Date  . Arteriosclerosis of coronary artery 01/27/2014  . Benign essential HTN 12/11/2013  . Carpal tunnel syndrome   . Coronary atherosclerosis of native coronary artery   . Disorder of mitral valve 12/11/2013  . Encounter for long-term (current) use of  antiplatelets/antithrombotics   . Gout   . Hyperlipidemia   . Hypertension   . Mitral valve disorder   . Osteoarthritis   . Prostate cancer [facility])     Past Surgical History:  Procedure Laterality Date  . COLONOSCOPY    . CORONARY ANGIOPLASTY WITH STENT PLACEMENT    . PROSTATE BIOPSY      Social History Social History  Substance Use Topics  . Smoking status: Never Smoker  . Smokeless tobacco: Never Used  . Alcohol use No    Family History Family History  Problem Relation Age of Onset  . Cancer Mother     stomach and uterus  . Cancer Sister     breast  No family history of bleeding/clotting disorders, porphyria or autoimmune disease   No Known Allergies   REVIEW OF SYSTEMS (Negative unless checked)  Constitutional: [] Weight loss  [] Fever  [] Chills Cardiac: [] Chest pain   [] Chest pressure   [] Palpitations   [] Shortness of breath when laying flat   [] Shortness of breath with exertion. Vascular:  [x] Pain in legs with walking   [] Pain in legs at rest  [] History of DVT   [] Phlebitis   [x] Swelling in legs   [] Varicose veins   [] Non-healing ulcers Pulmonary:   [] Uses home oxygen   [] Productive cough   [] Hemoptysis   [] Wheeze  [] COPD   [] Asthma Neurologic:  [] Dizziness   [] Seizures   [] History of stroke   [] History of TIA  [] Aphasia   [] Vissual changes   []   Weakness or numbness in arm   [] Weakness or numbness in leg Musculoskeletal:   [] Joint swelling   [] Joint pain   [] Low back pain Hematologic:  [] Easy bruising  [] Easy bleeding   [] Hypercoagulable state   [] Anemic Gastrointestinal:  [] Diarrhea   [] Vomiting  [] Gastroesophageal reflux/heartburn   [] Difficulty swallowing. Genitourinary:  [] Chronic kidney disease   [] Difficult urination  [] Frequent urination   [] Blood in urine Skin:  [] Rashes   [] Ulcers  Psychological:  [] History of anxiety   []  History of major depression.  Physical Examination  Vitals:   06/04/16 1338  BP: (!) 158/95  Pulse: 93  Resp: 17  Weight: 156 lb  (70.8 kg)  Height: 5\' 9"  (1.753 m)   Body mass index is 23.04 kg/m. Gen: WD/WN, NAD Head: [place]/AT, No temporalis wasting.  Ear/Nose/Throat: Hearing grossly intact, nares w/o erythema or drainage, poor dentition Eyes: PER, EOMI, sclera nonicteric.  Neck: Supple, no masses.  No bruit or JVD.  Pulmonary:  Good air movement, clear to auscultation bilaterally, no use of accessory muscles.  Cardiac: RRR, normal S1, S2, no Murmurs. Vascular:  Vessel Right Left  Radial Palpable Palpable  Ulnar Palpable Palpable  Brachial Palpable Palpable  Carotid Palpable Palpable  Femoral Palpable Palpable  Popliteal Not Palpable Not Palpable  PT Not Palpable Not Palpable  DP Not Palpable Not Palpable   Gastrointestinal: soft, non-distended. No guarding/no peritoneal signs.  Musculoskeletal: M/S 5/5 throughout.  No deformity or atrophy.  Neurologic: CN 2-12 intact. Pain and light touch intact in extremities.  Symmetrical.  Speech is fluent. Motor exam as listed above. Psychiatric: Judgment intact, Mood & affect appropriate for pt's clinical situation. Dermatologic: No rashes or ulcers noted.  No changes consistent with cellulitis. Lymph : No Cervical lymphadenopathy, no lichenification or skin changes of chronic lymphedema.  CBC No results found for: WBC, HGB, HCT, MCV, PLT  BMET    Component Value Date/Time   NA 139 07/09/2013 0410   K 3.6 07/09/2013 0410   CL 110 (H) 07/09/2013 0410   CO2 23 07/09/2013 0410   GLUCOSE 89 07/09/2013 0410   BUN 12 07/09/2013 0410   CREATININE 1.09 07/09/2013 0410   CALCIUM 8.3 (L) 07/09/2013 0410   GFRNONAA >60 07/09/2013 0410   GFRAA >60 07/09/2013 0410   CrCl cannot be calculated (Patient's most recent lab result is older than the maximum 21 days allowed.).  COAG No results found for: INR, PROTIME  Radiology No results found.  Assessment/Plan 1. PAD (peripheral artery disease) (HCC) Recommend:  I do not find evidence of life style limiting vascular  disease. The patient specifically denies this.  Previous noninvasive studies including ABI's of the legs do not identify critical vascular problems  The patient should continue walking and begin a more formal exercise program. The patient should continue his antiplatelet therapy and aggressive treatment of the lipid abnormalities. The patient should begin wearing graduated compression socks 15-20 mmHg strength to control her mild edema.  Patient will follow-up with me on a PRN basis  2. Arteriosclerosis of coronary artery Continue medications as prescribed; Nitrates PRN  3. Benign essential HTN Continue antihypertensive medications as Rx  4. Osteoarthritis, unspecified osteoarthritis type, unspecified site Continue NSAID therapy    Hortencia Pilar, MD  06/04/2016 2:41 PM

## 2016-07-20 ENCOUNTER — Encounter: Payer: Self-pay | Admitting: *Deleted

## 2016-07-23 ENCOUNTER — Ambulatory Visit: Payer: Medicare Other | Admitting: Anesthesiology

## 2016-07-23 ENCOUNTER — Encounter
Admission: RE | Disposition: A | Discharge status: Home / Self Care | Payer: Self-pay | Source: Ambulatory Visit | Attending: Unknown Physician Specialty

## 2016-07-23 ENCOUNTER — Ambulatory Visit
Admission: RE | Admit: 2016-07-23 | Discharge: 2016-07-23 | Disposition: A | Discharge status: Home / Self Care | Payer: Medicare Other | Source: Ambulatory Visit | Attending: Unknown Physician Specialty | Admitting: Unknown Physician Specialty

## 2016-07-23 DIAGNOSIS — D120 Benign neoplasm of cecum: Secondary | ICD-10-CM | POA: Diagnosis not present

## 2016-07-23 DIAGNOSIS — M109 Gout, unspecified: Secondary | ICD-10-CM | POA: Diagnosis not present

## 2016-07-23 DIAGNOSIS — K210 Gastro-esophageal reflux disease with esophagitis: Secondary | ICD-10-CM | POA: Diagnosis not present

## 2016-07-23 DIAGNOSIS — M1990 Unspecified osteoarthritis, unspecified site: Secondary | ICD-10-CM | POA: Diagnosis not present

## 2016-07-23 DIAGNOSIS — K621 Rectal polyp: Secondary | ICD-10-CM | POA: Insufficient documentation

## 2016-07-23 DIAGNOSIS — Z7982 Long term (current) use of aspirin: Secondary | ICD-10-CM | POA: Insufficient documentation

## 2016-07-23 DIAGNOSIS — D123 Benign neoplasm of transverse colon: Secondary | ICD-10-CM | POA: Diagnosis not present

## 2016-07-23 DIAGNOSIS — E785 Hyperlipidemia, unspecified: Secondary | ICD-10-CM | POA: Diagnosis not present

## 2016-07-23 DIAGNOSIS — Z803 Family history of malignant neoplasm of breast: Secondary | ICD-10-CM | POA: Insufficient documentation

## 2016-07-23 DIAGNOSIS — K317 Polyp of stomach and duodenum: Secondary | ICD-10-CM | POA: Diagnosis not present

## 2016-07-23 DIAGNOSIS — Z800 Family history of malignant neoplasm of digestive organs: Secondary | ICD-10-CM | POA: Diagnosis not present

## 2016-07-23 DIAGNOSIS — I2510 Atherosclerotic heart disease of native coronary artery without angina pectoris: Secondary | ICD-10-CM | POA: Diagnosis not present

## 2016-07-23 DIAGNOSIS — B9681 Helicobacter pylori [H. pylori] as the cause of diseases classified elsewhere: Secondary | ICD-10-CM | POA: Diagnosis not present

## 2016-07-23 DIAGNOSIS — Z1211 Encounter for screening for malignant neoplasm of colon: Secondary | ICD-10-CM | POA: Diagnosis not present

## 2016-07-23 DIAGNOSIS — Z8546 Personal history of malignant neoplasm of prostate: Secondary | ICD-10-CM | POA: Diagnosis not present

## 2016-07-23 DIAGNOSIS — Z86010 Personal history of colonic polyps: Secondary | ICD-10-CM | POA: Insufficient documentation

## 2016-07-23 DIAGNOSIS — I341 Nonrheumatic mitral (valve) prolapse: Secondary | ICD-10-CM | POA: Insufficient documentation

## 2016-07-23 DIAGNOSIS — Z955 Presence of coronary angioplasty implant and graft: Secondary | ICD-10-CM | POA: Insufficient documentation

## 2016-07-23 DIAGNOSIS — K2950 Unspecified chronic gastritis without bleeding: Secondary | ICD-10-CM | POA: Insufficient documentation

## 2016-07-23 DIAGNOSIS — K640 First degree hemorrhoids: Secondary | ICD-10-CM | POA: Diagnosis not present

## 2016-07-23 DIAGNOSIS — Z7902 Long term (current) use of antithrombotics/antiplatelets: Secondary | ICD-10-CM | POA: Insufficient documentation

## 2016-07-23 DIAGNOSIS — I10 Essential (primary) hypertension: Secondary | ICD-10-CM | POA: Diagnosis not present

## 2016-07-23 HISTORY — PX: COLONOSCOPY WITH PROPOFOL: SHX5780

## 2016-07-23 HISTORY — PX: ESOPHAGOGASTRODUODENOSCOPY (EGD) WITH PROPOFOL: SHX5813

## 2016-07-23 SURGERY — COLONOSCOPY WITH PROPOFOL
Anesthesia: General

## 2016-07-23 MED ORDER — EPHEDRINE SULFATE 50 MG/ML IJ SOLN
INTRAMUSCULAR | Status: DC | PRN
  Administered 2016-07-23 (×2): 10 mg via INTRAVENOUS

## 2016-07-23 MED ORDER — SODIUM CHLORIDE 0.9 % IV SOLN
INTRAVENOUS | Status: DC
  Administered 2016-07-23: 13:00:00 via INTRAVENOUS

## 2016-07-23 MED ORDER — PROPOFOL 10 MG/ML IV BOLUS
INTRAVENOUS | Status: DC | PRN
  Administered 2016-07-23: 40 mg via INTRAVENOUS
  Administered 2016-07-23: 30 mg via INTRAVENOUS
  Administered 2016-07-23: 40 mg via INTRAVENOUS

## 2016-07-23 MED ORDER — PROPOFOL 500 MG/50ML IV EMUL
INTRAVENOUS | Status: DC | PRN
  Administered 2016-07-23: 125 ug/kg/min via INTRAVENOUS

## 2016-07-23 MED ORDER — PHENYLEPHRINE HCL 10 MG/ML IJ SOLN
INTRAMUSCULAR | Status: DC | PRN
  Administered 2016-07-23 (×2): 100 ug via INTRAVENOUS
  Administered 2016-07-23: 200 ug via INTRAVENOUS

## 2016-07-23 NOTE — Op Note (Addendum)
[facility] Patient Name: Andrew [name] Procedure Date: 07/23/2016 1:05 PM MRN: [id number] Account #: [id number] Date of Birth: [birth year] Admit Type: Outpatient Age: 82 Room: [facility] ENDO ROOM 4 Gender: Male Note Status: Finalized Procedure:            Colonoscopy Indications:          High risk colon cancer surveillance: Personal history                        of colonic polyps Providers:            Manya Silvas, MD Referring MD:         Benn Moulder (Referring MD) Medicines:            Propofol per Anesthesia Complications:        No immediate complications. Procedure:            Pre-Anesthesia Assessment:                       - After reviewing the risks and benefits, the patient                        was deemed in satisfactory condition to undergo the                        procedure.                       After obtaining informed consent, the colonoscope was                        passed under direct vision. Throughout the procedure,                        the patient's blood pressure, pulse, and oxygen                        saturations were monitored continuously. The                        Colonoscope was introduced through the anus and                        advanced to the the cecum, identified by appendiceal                        orifice and ileocecal valve. The colonoscopy was                        performed without difficulty. The patient tolerated the                        procedure well. The quality of the bowel preparation                        was excellent. Findings:      Prostate was very large but no nodules felt.      A medium polyp was found in the rectum. The polyp was sessile. The polyp       was removed with a hot snare. Resection and retrieval were complete. To  prevent bleeding after the polypectomy, four hemostatic clips were       successfully placed. There was no bleeding at the end of the  procedure.      A diminutive polyp was found in the cecum. The polyp was sessile. The       polyp was removed with a hot snare. Resection and retrieval were       complete.      Two sessile polyps were found in the descending colon and transverse       colon. The polyps were diminutive in size. These polyps were removed       with a jumbo cold forceps. Resection and retrieval were complete.      Internal hemorrhoids were found during endoscopy. The hemorrhoids were       small and Grade I (internal hemorrhoids that do not prolapse). Impression:           - One medium polyp in the rectum, removed with a hot                        snare. Resected and retrieved. Clips were placed.                       - One diminutive polyp in the cecum, removed with a hot                        snare. Resected and retrieved.                       - Two diminutive polyps in the descending colon and in                        the transverse colon, removed with a jumbo cold                        forceps. Resected and retrieved.                       - Internal hemorrhoids. Recommendation:       - Await pathology results. Manya Silvas, MD 07/23/2016 1:55:29 PM This report has been signed electronically. Number of Addenda: 0 Note Initiated On: 07/23/2016 1:05 PM Scope Withdrawal Time: 0 hours 19 minutes 3 seconds  Total Procedure Duration: 0 hours 24 minutes 43 seconds       [facility]

## 2016-07-23 NOTE — Transfer of Care (Signed)
Immediate Anesthesia Transfer of Care Note  Patient: Andrew [name]  Procedure(s) Performed: Procedure(s): COLONOSCOPY WITH PROPOFOL (N/A) ESOPHAGOGASTRODUODENOSCOPY (EGD) WITH PROPOFOL (N/A)  Patient Location: PACU  Anesthesia Type:General  Level of Consciousness: unresponsive  Airway & Oxygen Therapy: Patient Spontanous Breathing and Patient connected to nasal cannula oxygen  Post-op Assessment: Report given to RN and Post -op Vital signs reviewed and stable  Post vital signs: Reviewed and stable  Last Vitals:  Vitals:   07/23/16 1227 07/23/16 1357  BP: (!) 162/106 (!) 106/59  Pulse: (!) 109 99  Resp: 17 (!) 28  Temp: (!) 35.9 C     Last Pain:  Vitals:   07/23/16 1357  TempSrc: Tympanic         Complications: No apparent anesthesia complications

## 2016-07-23 NOTE — H&P (Signed)
Primary Care Physician:  [name], MD Primary Gastroenterologist:  Dr. Vira Agar  Pre-Procedure History & Physical: HPI:  [name] is a 82 y.o. male is here for an endoscopy and colonoscopy.   Past Medical History:  Diagnosis Date  . Arteriosclerosis of coronary artery 01/27/2014  . Benign essential HTN 12/11/2013  . Carpal tunnel syndrome   . Coronary atherosclerosis of native coronary artery   . Disorder of mitral valve 12/11/2013  . Encounter for long-term (current) use of antiplatelets/antithrombotics   . Gout   . Hyperlipidemia   . Hypertension   . Mitral valve disorder   . Osteoarthritis   . Prostate cancer [facility])     Past Surgical History:  Procedure Laterality Date  . COLONOSCOPY    . CORONARY ANGIOPLASTY WITH STENT PLACEMENT    . PROSTATE BIOPSY      Prior to Admission medications   Medication Sig Start Date End Date Taking? Authorizing Provider  amLODipine (NORVASC) 10 MG tablet TAKE 1 TABLET (10 MG TOTAL) BY MOUTH ONCE DAILY. 04/24/15  Yes Historical Provider, MD  aspirin 81 MG tablet Take 325 mg by mouth daily.    Yes Historical Provider, MD  atorvastatin (LIPITOR) 40 MG tablet Take 40 mg by mouth daily. 04/24/15  Yes Historical Provider, MD  clopidogrel (PLAVIX) 75 MG tablet Take 75 mg by mouth daily. 04/24/15  Yes Historical Provider, MD  finasteride (PROSCAR) 5 MG tablet Take 5 mg by mouth daily. 04/13/15  Yes Historical Provider, MD  meloxicam (MOBIC) 15 MG tablet Take 15 mg by mouth daily.   Yes Historical Provider, MD  metoprolol (LOPRESSOR) 50 MG tablet Take 50 mg by mouth 2 (two) times daily. 04/24/15  Yes Historical Provider, MD  naproxen sodium (ANAPROX) 220 MG tablet Take 220 mg by mouth 2 (two) times daily with a meal.   Yes Historical Provider, MD  hydrochlorothiazide (HYDRODIURIL) 25 MG tablet Take by mouth. 07/18/15 07/17/16  Historical Provider, MD    Allergies as of 07/09/2016  . (No Known Allergies)    Family History  Problem Relation Age of  Onset  . Cancer Mother     stomach and uterus  . Cancer Sister     breast    Social History   Social History  . Marital status: Married    Spouse name: N/A  . Number of children: N/A  . Years of education: N/A   Occupational History  . Not on file.   Social History Main Topics  . Smoking status: Never Smoker  . Smokeless tobacco: Never Used  . Alcohol use No  . Drug use: No  . Sexual activity: No   Other Topics Concern  . Not on file   Social History Narrative  . No narrative on file    Review of Systems: See HPI, otherwise negative ROS.  Patient cleared by his cardiologist Dr. Ubaldo Glassing on 05/14/2016  Physical Exam: BP (!) 162/106   Pulse (!) 109   Temp (!) 96.6 F (35.9 C) (Oral)   Resp 17   Ht 5\' 9"  (1.753 m)   Wt 72.6 kg (160 lb)   SpO2 100%   BMI 23.63 kg/m  General:   Alert,  pleasant and cooperative in NAD Head:  Normocephalic and atraumatic. Neck:  Supple; no masses or thyromegaly. Lungs:  Clear throughout to auscultation.    Heart:  3/6 systolic murmur Abdomen:  Soft, nontender and nondistended. Normal bowel sounds, without guarding, and without rebound.   Neurologic:  Alert  and  oriented x4;  grossly normal neurologically.  Impression/Plan: Andrew [name] is here for an endoscopy and colonoscopy to be performed for [facility] colon polyps, abdnormal weight loss  Risks, benefits, limitations, and alternatives regarding  endoscopy and colonoscopy have been reviewed with the patient.  Questions have been answered.  All parties agreeable.   Gaylyn Cheers, MD  07/23/2016, 1:02 PM

## 2016-07-23 NOTE — Anesthesia Preprocedure Evaluation (Signed)
Anesthesia Evaluation  Patient identified by MRN, date of birth, ID band Patient awake    Reviewed: Allergy & Precautions, NPO status , Patient's Chart, lab work & pertinent test results  History of Anesthesia Complications Negative for: history of anesthetic complications  Airway Mallampati: II  TM Distance: >3 FB Neck ROM: Full    Dental  (+) Poor Dentition   Pulmonary neg pulmonary ROS, neg sleep apnea, neg COPD,    breath sounds clear to auscultation- rhonchi (-) wheezing      Cardiovascular Exercise Tolerance: Good hypertension, Pt. on medications + CAD and + Cardiac Stents ( PCI of the left circumflex with a drug-eluting stent)  (-) Past MI  Rhythm:Regular Rate:Normal - Systolic murmurs and - Diastolic murmurs Echo [id number]: NORMAL LEFT VENTRICULAR SYSTOLIC FUNCTION WITH MODERATE LVH NORMAL RIGHT VENTRICULAR SYSTOLIC FUNCTION SEVERE VALVULAR REGURGITATION (severe MR) NO VALVULAR STENOSIS TRIVIAL PERICARDIAL EFFUSION MILD to MODERATE TR MODERATE PHTN MVP EF 50-55%  05/08/16 NM stress test: negative for ischemia    Neuro/Psych negative neurological ROS  negative psych ROS   GI/Hepatic negative GI ROS, Neg liver ROS,   Endo/Other  negative endocrine ROSneg diabetes  Renal/GU negative Renal ROS     Musculoskeletal  (+) Arthritis ,   Abdominal (+) - obese,   Peds  Hematology negative hematology ROS (+)   Anesthesia Other Findings   Reproductive/Obstetrics                             Anesthesia Physical Anesthesia Plan  ASA: III  Anesthesia Plan: General   Post-op Pain Management:    Induction: Intravenous  Airway Management Planned: Natural Airway  Additional Equipment:   Intra-op Plan:   Post-operative Plan:   Informed Consent: I have reviewed the patients History and Physical, chart, labs and discussed the procedure including the risks, benefits and alternatives  for the proposed anesthesia with the patient or authorized representative who has indicated his/her understanding and acceptance.   Dental advisory given  Plan Discussed with: CRNA and Anesthesiologist  Anesthesia Plan Comments:         Anesthesia Quick Evaluation

## 2016-07-23 NOTE — Op Note (Addendum)
[facility] Gastroenterology Patient Name: Andrew [name] Procedure Date: 07/23/2016 1:05 PM MRN: [id number] Account #: [id number] Date of Birth: Dec 23, 1932 Admit Type: Outpatient Age: 82 Room: [facility] ENDO ROOM 4 Gender: Male Note Status: Finalized Procedure:            Upper GI endoscopy Indications:          Weight loss Providers:            Manya Silvas, MD Medicines:            Propofol per Anesthesia Complications:        No immediate complications. Procedure:            Pre-Anesthesia Assessment:                       - After reviewing the risks and benefits, the patient                        was deemed in satisfactory condition to undergo the                        procedure.                       After obtaining informed consent, the endoscope was                        passed under direct vision. Throughout the procedure,                        the patient's blood pressure, pulse, and oxygen                        saturations were monitored continuously. The Endoscope                        was introduced through the mouth, and advanced to the                        second part of duodenum. The upper GI endoscopy was                        accomplished without difficulty. The patient tolerated                        the procedure well. Findings:      Non-severe esophagitis with no bleeding was found 42 cm from the       incisors.      Diffuse and patchy moderate inflammation characterized by erosions,       erythema and granularity was found in the gastric body and in the       gastric antrum. Biopsies were taken with a cold forceps for histology.       Biopsies were taken with a cold forceps for Helicobacter pylori testing.      A single 20 mm sessile polyp with no bleeding was found in the second       portion of the duodenum. Biopsies were taken with a cold forceps for       histology. Impression:           - Non-severe reflux esophagitis.                 -  Gastritis. Biopsied.                       - A single duodenal polyp. Biopsied. Recommendation:       - Await pathology results. Manya Silvas, MD 07/23/2016 1:25:34 PM This report has been signed electronically. Number of Addenda: 0 Note Initiated On: 07/23/2016 1:05 PM      [facility]

## 2016-07-23 NOTE — Anesthesia Postprocedure Evaluation (Signed)
Anesthesia Post Note  Patient: Andrew [name]  Procedure(s) Performed: Procedure(s) (LRB): COLONOSCOPY WITH PROPOFOL (N/A) ESOPHAGOGASTRODUODENOSCOPY (EGD) WITH PROPOFOL (N/A)  Patient location during evaluation: Endoscopy Anesthesia Type: General Level of consciousness: awake and alert and oriented Pain management: pain level controlled Vital Signs Assessment: post-procedure vital signs reviewed and stable Respiratory status: spontaneous breathing, nonlabored ventilation and respiratory function stable Cardiovascular status: blood pressure returned to baseline and stable Postop Assessment: no signs of nausea or vomiting Anesthetic complications: no    Last Vitals:  Vitals:   07/23/16 1409 07/23/16 1429  BP:  (!) 136/49  Pulse:    Resp: 15   Temp:      Last Pain:  Vitals:   07/23/16 1359  TempSrc: Tympanic                 Joselle Deeds

## 2016-07-24 ENCOUNTER — Encounter: Payer: Self-pay | Admitting: Unknown Physician Specialty

## 2016-07-24 LAB — SURGICAL PATHOLOGY

## 2016-09-26 ENCOUNTER — Ambulatory Visit
Admission: EM | Admit: 2016-09-26 | Discharge: 2016-09-26 | Disposition: A | Discharge status: Home / Self Care | Payer: Medicare Other | Attending: Family Medicine | Admitting: Family Medicine

## 2016-09-26 ENCOUNTER — Encounter: Payer: Self-pay | Admitting: *Deleted

## 2016-09-26 DIAGNOSIS — M79672 Pain in left foot: Secondary | ICD-10-CM | POA: Diagnosis not present

## 2016-09-26 MED ORDER — PREDNISONE 20 MG PO TABS: 20.0000 mg | ORAL_TABLET | Freq: Every day | ORAL | 0 refills | Status: DC

## 2016-09-26 MED ORDER — HYDROCODONE-ACETAMINOPHEN 5-325 MG PO TABS: ORAL_TABLET | ORAL | 0 refills | Status: DC

## 2016-09-26 NOTE — ED Provider Notes (Signed)
[facility]    CSN: [id number] Arrival date & time: 09/26/16  0957     History   Chief Complaint Chief Complaint  Patient presents with  . Foot Pain    HPI Andrew [name] is a 83 y.o. male.   The history is provided by the patient.  Foot Pain  This is a new problem. The current episode started more than 1 week ago (1 month ago). The problem occurs constantly. The problem has not changed since onset.Pertinent negatives include no chest pain, no abdominal pain, no headaches and no shortness of breath.    Past Medical History:  Diagnosis Date  . Arteriosclerosis of coronary artery 01/27/2014  . Benign essential HTN 12/11/2013  . Carpal tunnel syndrome   . Coronary atherosclerosis of native coronary artery   . Disorder of mitral valve 12/11/2013  . Encounter for long-term (current) use of antiplatelets/antithrombotics   . Gout   . Hyperlipidemia   . Hypertension   . Mitral valve disorder   . Osteoarthritis   . Prostate cancer [facility])     Patient Active Problem List   Diagnosis Date Noted  . PAD (peripheral artery disease) ([place]) 06/04/2016  . Colon polyp 06/29/2015  . Malignant neoplasm of prostate ([place]) 11/14/2014  . Combined fat and carbohydrate induced hyperlipemia 07/29/2014  . Long term current use of antithrombotics/antiplatelets 01/27/2014  . Arteriosclerosis of coronary artery 01/27/2014  . Carpal tunnel syndrome 12/11/2013  . Benign essential HTN 12/11/2013  . Lesion of ulnar nerve 12/11/2013  . Disorder of mitral valve 12/11/2013  . Arthritis, degenerative 12/11/2013  . Hereditary and idiopathic neuropathy 12/11/2013    Past Surgical History:  Procedure Laterality Date  . COLONOSCOPY    . COLONOSCOPY WITH PROPOFOL N/A 07/23/2016   Procedure: COLONOSCOPY WITH PROPOFOL;  Surgeon: Manya Silvas, MD;  Location: [facility] ENDOSCOPY;  Service: Endoscopy;  Laterality: N/A;  . CORONARY ANGIOPLASTY WITH STENT PLACEMENT    . ESOPHAGOGASTRODUODENOSCOPY (EGD)  WITH PROPOFOL N/A 07/23/2016   Procedure: ESOPHAGOGASTRODUODENOSCOPY (EGD) WITH PROPOFOL;  Surgeon: Manya Silvas, MD;  Location: [facility] ENDOSCOPY;  Service: Endoscopy;  Laterality: N/A;  . PROSTATE BIOPSY         Home Medications    Prior to Admission medications   Medication Sig Start Date End Date Taking? Authorizing Provider  amLODipine (NORVASC) 10 MG tablet TAKE 1 TABLET (10 MG TOTAL) BY MOUTH ONCE DAILY. 04/24/15  Yes Historical Provider, MD  aspirin EC 325 MG tablet Take 325 mg by mouth daily.   Yes Historical Provider, MD  atorvastatin (LIPITOR) 40 MG tablet Take 40 mg by mouth daily. 04/24/15  Yes Historical Provider, MD  clopidogrel (PLAVIX) 75 MG tablet Take 75 mg by mouth daily. 04/24/15  Yes Historical Provider, MD  metoprolol (LOPRESSOR) 50 MG tablet Take 50 mg by mouth 2 (two) times daily. 04/24/15  Yes Historical Provider, MD  aspirin 81 MG tablet Take 325 mg by mouth daily.     Historical Provider, MD  finasteride (PROSCAR) 5 MG tablet Take 5 mg by mouth daily. 04/13/15   Historical Provider, MD  hydrochlorothiazide (HYDRODIURIL) 25 MG tablet Take by mouth. 07/18/15 07/17/16  Historical Provider, MD  HYDROcodone-acetaminophen (NORCO/VICODIN) 5-325 MG tablet 1 tab po qhs prn 09/26/16   Norval Gable, MD  meloxicam (MOBIC) 15 MG tablet Take 15 mg by mouth daily.    Historical Provider, MD  naproxen sodium (ANAPROX) 220 MG tablet Take 220 mg by mouth 2 (two) times daily with a meal.  Historical Provider, MD  predniSONE (DELTASONE) 20 MG tablet Take 1 tablet (20 mg total) by mouth daily. 09/26/16   Norval Gable, MD    Family History Family History  Problem Relation Age of Onset  . Cancer Mother     stomach and uterus  . Cancer Sister     breast    Social History Social History  Substance Use Topics  . Smoking status: Never Smoker  . Smokeless tobacco: Never Used  . Alcohol use No     Allergies   Patient has no known allergies.   Review of Systems Review of  Systems  Respiratory: Negative for shortness of breath.   Cardiovascular: Negative for chest pain.  Gastrointestinal: Negative for abdominal pain.  Neurological: Negative for headaches.     Physical Exam Triage Vital Signs ED Triage Vitals  Enc Vitals Group     BP 09/26/16 1013 (!) 149/83     Pulse Rate 09/26/16 1013 85     Resp 09/26/16 1013 16     Temp 09/26/16 1013 97.7 F (36.5 C)     Temp Source 09/26/16 1013 Oral     SpO2 09/26/16 1013 99 %     Weight 09/26/16 1015 170 lb (77.1 kg)     Height 09/26/16 1015 5\' 9"  (1.753 m)     Head Circumference --      Peak Flow --      Pain Score 09/26/16 1055 4     Pain Loc --      Pain Edu? --      Excl. in [place]? --    No data found.   Updated Vital Signs BP (!) 149/83 (BP Location: Left Arm)   Pulse 85   Temp 97.7 F (36.5 C) (Oral)   Resp 16   Ht 5\' 9"  (1.753 m)   Wt 170 lb (77.1 kg)   SpO2 99%   BMI 25.10 kg/m   Visual Acuity Right Eye Distance:   Left Eye Distance:   Bilateral Distance:    Right Eye Near:   Left Eye Near:    Bilateral Near:     Physical Exam  Constitutional: He appears well-developed and well-nourished. No distress.  Musculoskeletal:       Left foot: There is tenderness. There is normal range of motion, no bony tenderness, no swelling, normal capillary refill, no crepitus, no deformity and no laceration.  Palpable but decreased DP pulse  Skin: He is not diaphoretic.  Nursing note and vitals reviewed.    UC Treatments / Results  Labs (all labs ordered are listed, but only abnormal results are displayed) Labs Reviewed - No data to display  EKG  EKG Interpretation None       Radiology No results found.  Procedures Procedures (including critical care time)  Medications Ordered in UC Medications - No data to display   Initial Impression / Assessment and Plan / UC Course  I have reviewed the triage vital signs and the nursing notes.  Pertinent labs & imaging results that were  available during my care of the patient were reviewed by me and considered in my medical decision making (see chart for details).       Final Clinical Impressions(s) / UC Diagnoses   Final diagnoses:  Foot pain, left    New Prescriptions Discharge Medication List as of 09/26/2016 10:53 AM    START taking these medications   Details  HYDROcodone-acetaminophen (NORCO/VICODIN) 5-325 MG tablet 1 tab po qhs prn, Print  predniSONE (DELTASONE) 20 MG tablet Take 1 tablet (20 mg total) by mouth daily., Starting Wed 09/26/2016, Normal       1. diagnosis reviewed with patient 2. rx as per orders above; reviewed possible side effects, interactions, risks and benefits  3. Recommend supportive treatment with otc acetaminophen prn  4. Follow-up with PCP for further evaluation and/or referral   Norval Gable, MD 09/26/16 1407

## 2016-09-26 NOTE — ED Triage Notes (Signed)
Left foot pain x several weeks. Hx of Gout.

## 2016-09-26 NOTE — Discharge Instructions (Signed)
Follow up with Primary Care Provider °

## 2017-04-08 ENCOUNTER — Emergency Department: Payer: Medicare Other

## 2017-04-08 ENCOUNTER — Inpatient Hospital Stay
Admission: EM | Admit: 2017-04-08 | Discharge: 2017-04-10 | DRG: 684 | Disposition: A | Discharge status: Home / Self Care | Payer: Medicare Other | Attending: Internal Medicine | Admitting: Internal Medicine

## 2017-04-08 DIAGNOSIS — N183 Chronic kidney disease, stage 3 (moderate): Secondary | ICD-10-CM | POA: Diagnosis present

## 2017-04-08 DIAGNOSIS — R911 Solitary pulmonary nodule: Secondary | ICD-10-CM | POA: Diagnosis present

## 2017-04-08 DIAGNOSIS — I2510 Atherosclerotic heart disease of native coronary artery without angina pectoris: Secondary | ICD-10-CM | POA: Diagnosis present

## 2017-04-08 DIAGNOSIS — D631 Anemia in chronic kidney disease: Secondary | ICD-10-CM | POA: Diagnosis present

## 2017-04-08 DIAGNOSIS — Z7982 Long term (current) use of aspirin: Secondary | ICD-10-CM | POA: Diagnosis not present

## 2017-04-08 DIAGNOSIS — Z800 Family history of malignant neoplasm of digestive organs: Secondary | ICD-10-CM | POA: Diagnosis not present

## 2017-04-08 DIAGNOSIS — Z7902 Long term (current) use of antithrombotics/antiplatelets: Secondary | ICD-10-CM | POA: Diagnosis not present

## 2017-04-08 DIAGNOSIS — Z79899 Other long term (current) drug therapy: Secondary | ICD-10-CM | POA: Diagnosis not present

## 2017-04-08 DIAGNOSIS — Z8546 Personal history of malignant neoplasm of prostate: Secondary | ICD-10-CM

## 2017-04-08 DIAGNOSIS — E785 Hyperlipidemia, unspecified: Secondary | ICD-10-CM | POA: Diagnosis present

## 2017-04-08 DIAGNOSIS — I340 Nonrheumatic mitral (valve) insufficiency: Secondary | ICD-10-CM | POA: Diagnosis present

## 2017-04-08 DIAGNOSIS — M109 Gout, unspecified: Secondary | ICD-10-CM | POA: Diagnosis present

## 2017-04-08 DIAGNOSIS — E860 Dehydration: Secondary | ICD-10-CM | POA: Diagnosis present

## 2017-04-08 DIAGNOSIS — I959 Hypotension, unspecified: Secondary | ICD-10-CM | POA: Diagnosis present

## 2017-04-08 DIAGNOSIS — Z8049 Family history of malignant neoplasm of other genital organs: Secondary | ICD-10-CM

## 2017-04-08 DIAGNOSIS — Z791 Long term (current) use of non-steroidal anti-inflammatories (NSAID): Secondary | ICD-10-CM

## 2017-04-08 DIAGNOSIS — N189 Chronic kidney disease, unspecified: Secondary | ICD-10-CM

## 2017-04-08 DIAGNOSIS — Z955 Presence of coronary angioplasty implant and graft: Secondary | ICD-10-CM

## 2017-04-08 DIAGNOSIS — N179 Acute kidney failure, unspecified: Secondary | ICD-10-CM | POA: Diagnosis not present

## 2017-04-08 DIAGNOSIS — Z853 Personal history of malignant neoplasm of breast: Secondary | ICD-10-CM | POA: Diagnosis not present

## 2017-04-08 DIAGNOSIS — I129 Hypertensive chronic kidney disease with stage 1 through stage 4 chronic kidney disease, or unspecified chronic kidney disease: Secondary | ICD-10-CM | POA: Diagnosis present

## 2017-04-08 HISTORY — DX: Solitary pulmonary nodule: R91.1

## 2017-04-08 LAB — CBC WITH DIFFERENTIAL/PLATELET
Basophils Absolute: 0.1 10*3/uL (ref 0–0.1)
Basophils Relative: 1 %
EOS ABS: 0.3 10*3/uL (ref 0–0.7)
EOS PCT: 3 %
HCT: 35.7 % — ABNORMAL LOW (ref 40.0–52.0)
HEMOGLOBIN: 11.9 g/dL — AB (ref 13.0–18.0)
LYMPHS ABS: 1.9 10*3/uL (ref 1.0–3.6)
LYMPHS PCT: 20 %
MCH: 29.4 pg (ref 26.0–34.0)
MCHC: 33.2 g/dL (ref 32.0–36.0)
MCV: 88.5 fL (ref 80.0–100.0)
MONOS PCT: 12 %
Monocytes Absolute: 1.1 10*3/uL — ABNORMAL HIGH (ref 0.2–1.0)
NEUTROS PCT: 64 %
Neutro Abs: 6.3 10*3/uL (ref 1.4–6.5)
Platelets: 442 10*3/uL — ABNORMAL HIGH (ref 150–440)
RBC: 4.04 MIL/uL — AB (ref 4.40–5.90)
RDW: 16.5 % — ABNORMAL HIGH (ref 11.5–14.5)
WBC: 9.6 10*3/uL (ref 3.8–10.6)

## 2017-04-08 LAB — COMPREHENSIVE METABOLIC PANEL
ALBUMIN: 3.7 g/dL (ref 3.5–5.0)
ALK PHOS: 101 U/L (ref 38–126)
ALT: 10 U/L — ABNORMAL LOW (ref 17–63)
AST: 23 U/L (ref 15–41)
Anion gap: 12 (ref 5–15)
BILIRUBIN TOTAL: 0.8 mg/dL (ref 0.3–1.2)
BUN: 66 mg/dL — AB (ref 6–20)
CALCIUM: 9.2 mg/dL (ref 8.9–10.3)
CO2: 23 mmol/L (ref 22–32)
Chloride: 103 mmol/L (ref 101–111)
Creatinine, Ser: 3.45 mg/dL — ABNORMAL HIGH (ref 0.61–1.24)
GFR calc Af Amer: 18 mL/min — ABNORMAL LOW (ref >60)
GFR calc non Af Amer: 15 mL/min — ABNORMAL LOW (ref >60)
GLUCOSE: 132 mg/dL — AB (ref 65–99)
Potassium: 4.1 mmol/L (ref 3.5–5.1)
SODIUM: 138 mmol/L (ref 135–145)
TOTAL PROTEIN: 7.1 g/dL (ref 6.5–8.1)

## 2017-04-08 LAB — PROTEIN / CREATININE RATIO, URINE
CREATININE, URINE: 62 mg/dL
Total Protein, Urine: <6 mg/dL

## 2017-04-08 LAB — URINALYSIS, ROUTINE W REFLEX MICROSCOPIC
BILIRUBIN URINE: NEGATIVE
GLUCOSE, UA: NEGATIVE mg/dL
HGB URINE DIPSTICK: NEGATIVE
Ketones, ur: NEGATIVE mg/dL
Leukocytes, UA: NEGATIVE
Nitrite: NEGATIVE
Protein, ur: NEGATIVE mg/dL
SPECIFIC GRAVITY, URINE: 1.006 (ref 1.005–1.030)
pH: 5 (ref 5.0–8.0)

## 2017-04-08 LAB — SODIUM, URINE, RANDOM: SODIUM UR: 101 mmol/L

## 2017-04-08 LAB — TSH: TSH: 2.188 u[IU]/mL (ref 0.350–4.500)

## 2017-04-08 LAB — LACTIC ACID, PLASMA
Lactic Acid, Venous: 2.5 mmol/L (ref 0.5–1.9)
Lactic Acid, Venous: 1.4 mmol/L (ref 0.5–1.9)

## 2017-04-08 LAB — TROPONIN I: Troponin I: <0.03 ng/mL (ref <0.03)

## 2017-04-08 LAB — BRAIN NATRIURETIC PEPTIDE: B Natriuretic Peptide: 73 pg/mL (ref 0.0–100.0)

## 2017-04-08 MED ORDER — SODIUM CHLORIDE 0.9 % IV BOLUS (SEPSIS)
1000.0000 mL | Freq: Once | INTRAVENOUS | Status: AC
  Administered 2017-04-08: 1000 mL via INTRAVENOUS

## 2017-04-08 MED ORDER — HEPARIN SODIUM (PORCINE) 5000 UNIT/ML IJ SOLN
5000.0000 [IU] | Freq: Three times a day (TID) | INTRAMUSCULAR | Status: DC
  Administered 2017-04-08 – 2017-04-10 (×6): 5000 [IU] via SUBCUTANEOUS
  Filled 2017-04-08 (×6): qty 1

## 2017-04-08 MED ORDER — ACETAMINOPHEN 325 MG PO TABS: 650.0000 mg | ORAL_TABLET | Freq: Four times a day (QID) | ORAL | Status: DC | PRN

## 2017-04-08 MED ORDER — ALLOPURINOL 300 MG PO TABS
400.0000 mg | ORAL_TABLET | Freq: Every day | ORAL | Status: DC
  Administered 2017-04-09 – 2017-04-10 (×2): 400 mg via ORAL
  Filled 2017-04-08 (×2): qty 1

## 2017-04-08 MED ORDER — FINASTERIDE 5 MG PO TABS
5.0000 mg | ORAL_TABLET | Freq: Every day | ORAL | Status: DC
  Administered 2017-04-09 – 2017-04-10 (×2): 5 mg via ORAL
  Filled 2017-04-08 (×2): qty 1

## 2017-04-08 MED ORDER — ACETAMINOPHEN 650 MG RE SUPP: 650.0000 mg | Freq: Four times a day (QID) | RECTAL | Status: DC | PRN

## 2017-04-08 MED ORDER — ATORVASTATIN CALCIUM 20 MG PO TABS
40.0000 mg | ORAL_TABLET | Freq: Every day | ORAL | Status: DC
  Administered 2017-04-09 – 2017-04-10 (×2): 40 mg via ORAL
  Filled 2017-04-08 (×2): qty 2

## 2017-04-08 MED ORDER — ASPIRIN 325 MG PO TABS
325.0000 mg | ORAL_TABLET | Freq: Every day | ORAL | Status: DC
  Administered 2017-04-09 – 2017-04-10 (×2): 325 mg via ORAL
  Filled 2017-04-08 (×2): qty 1

## 2017-04-08 MED ORDER — ONDANSETRON HCL 4 MG PO TABS: 4.0000 mg | ORAL_TABLET | Freq: Four times a day (QID) | ORAL | Status: DC | PRN

## 2017-04-08 MED ORDER — CLOPIDOGREL BISULFATE 75 MG PO TABS
75.0000 mg | ORAL_TABLET | Freq: Every day | ORAL | Status: DC
  Administered 2017-04-09 – 2017-04-10 (×2): 75 mg via ORAL
  Filled 2017-04-08 (×2): qty 1

## 2017-04-08 MED ORDER — SODIUM CHLORIDE 0.9 % IV SOLN
INTRAVENOUS | Status: DC
  Administered 2017-04-08 – 2017-04-10 (×5): via INTRAVENOUS

## 2017-04-08 MED ORDER — ONDANSETRON HCL 4 MG/2ML IJ SOLN: 4.0000 mg | Freq: Four times a day (QID) | INTRAMUSCULAR | Status: DC | PRN

## 2017-04-08 MED ORDER — HYDROCODONE-ACETAMINOPHEN 5-325 MG PO TABS: 1.0000 | ORAL_TABLET | Freq: Four times a day (QID) | ORAL | Status: DC | PRN

## 2017-04-08 NOTE — H&P (Signed)
[facility] NAME: Andrew [name]    MR#:  [id number]  DATE OF BIRTH:  October 14, 1932  DATE OF ADMISSION:  04/08/2017  PRIMARY CARE PHYSICIAN: [name], MD   REQUESTING/REFERRING PHYSICIAN: Arta Silence MD  CHIEF COMPLAINT:   Chief Complaint  Patient presents with  . Hypotension    HISTORY OF PRESENT ILLNESS: Andrew [name]  is a 83 y.o. male with a known history of Severe mitral regurg, essential hypertension, coronary artery disease, gout, hyperlipidemia, hypertension and prostate cancer who is presenting to the hospital with complaint of dizziness. Patient states that his been feeling like this for the past few days. He has not passed out. He was seen at the urgent clinic and was noted to have blood pressure initially in the 60s. Now blood pressures normalize after receiving IV fluid. Patient also noticed to have acute on chronic renal failure. He does report that he works outside in the sun.  PAST MEDICAL HISTORY:   Past Medical History:  Diagnosis Date  . Arteriosclerosis of coronary artery 01/27/2014  . Benign essential HTN 12/11/2013  . Carpal tunnel syndrome   . Coronary atherosclerosis of native coronary artery   . Disorder of mitral valve 12/11/2013  . Encounter for long-term (current) use of antiplatelets/antithrombotics   . Gout   . Hyperlipidemia   . Hypertension   . Mitral valve disorder   . Osteoarthritis   . Prostate cancer ([place])     PAST SURGICAL HISTORY: Past Surgical History:  Procedure Laterality Date  . COLONOSCOPY    . COLONOSCOPY WITH PROPOFOL N/A 07/23/2016   Procedure: COLONOSCOPY WITH PROPOFOL;  Surgeon: Manya Silvas, MD;  Location: [facility] ENDOSCOPY;  Service: Endoscopy;  Laterality: N/A;  . CORONARY ANGIOPLASTY WITH STENT PLACEMENT    . ESOPHAGOGASTRODUODENOSCOPY (EGD) WITH PROPOFOL N/A 07/23/2016   Procedure: ESOPHAGOGASTRODUODENOSCOPY (EGD) WITH PROPOFOL;  Surgeon: Manya Silvas, MD;  Location:  [facility] ENDOSCOPY;  Service: Endoscopy;  Laterality: N/A;  . PROSTATE BIOPSY      SOCIAL HISTORY:  Social History  Substance Use Topics  . Smoking status: Never Smoker  . Smokeless tobacco: Never Used  . Alcohol use No    FAMILY HISTORY:  Family History  Problem Relation Age of Onset  . Cancer Mother        stomach and uterus  . Cancer Sister        breast    DRUG ALLERGIES: No Known Allergies  REVIEW OF SYSTEMS:   CONSTITUTIONAL: No fever,Positive fatigue and weakness.  EYES: No blurred or double vision.  EARS, NOSE, AND THROAT: No tinnitus or ear pain.  RESPIRATORY: No cough, shortness of breath, wheezing or hemoptysis.  CARDIOVASCULAR: No chest pain, orthopnea, edema.  GASTROINTESTINAL: No nausea, vomiting, diarrhea or abdominal pain.  GENITOURINARY: No dysuria, hematuria.  ENDOCRINE: No polyuria, nocturia,  HEMATOLOGY: No anemia, easy bruising or bleeding SKIN: No rash or lesion. MUSCULOSKELETAL: No joint pain or arthritis.   NEUROLOGIC: No tingling, numbness, weakness. Positive dizziness PSYCHIATRY: No anxiety or depression.   MEDICATIONS AT HOME:  Prior to Admission medications   Medication Sig Start Date End Date Taking? Authorizing Provider  allopurinol (ZYLOPRIM) 300 MG tablet Take 400 mg by mouth daily. 02/20/17  Yes [provider]  amLODipine (NORVASC) 10 MG tablet TAKE 1 TABLET (10 MG TOTAL) BY MOUTH ONCE DAILY. 04/24/15  Yes [provider]  aspirin 325 MG tablet Take 325 mg by mouth daily.    Yes [provider]  atorvastatin (LIPITOR) 40 MG tablet Take 40 mg by mouth daily. 04/24/15  Yes [provider]  clopidogrel (PLAVIX) 75 MG tablet Take 75 mg by mouth daily. 04/24/15  Yes [provider]  finasteride (PROSCAR) 5 MG tablet Take 5 mg by mouth daily. 04/13/15  Yes [provider]  furosemide (LASIX) 20 MG tablet Take 40 mg by mouth daily. 02/20/17  Yes [provider]  hydrochlorothiazide  (HYDRODIURIL) 25 MG tablet Take 25 mg by mouth daily.  07/18/15 04/08/18 Yes [provider]  meloxicam (MOBIC) 15 MG tablet Take 15 mg by mouth daily.   Yes [provider]  metolazone (ZAROXOLYN) 5 MG tablet Take 5 mg by mouth daily. 03/28/17  Yes [provider]  metoprolol (LOPRESSOR) 50 MG tablet Take 50 mg by mouth 2 (two) times daily. 04/24/15  Yes [provider]  naproxen sodium (ANAPROX) 220 MG tablet Take 220 mg by mouth 2 (two) times daily with a meal.   Yes [provider]  HYDROcodone-acetaminophen (NORCO/VICODIN) 5-325 MG tablet 1 tab po qhs prn Patient not taking: Reported on 04/08/2017 09/26/16   Norval Gable, MD  predniSONE (DELTASONE) 20 MG tablet Take 1 tablet (20 mg total) by mouth daily. Patient not taking: Reported on 04/08/2017 09/26/16   Norval Gable, MD      PHYSICAL EXAMINATION:   VITAL SIGNS: Blood pressure 120/60, pulse 60, resp. rate 13, height 5\' 9"  (1.753 m), weight 156 lb (70.8 kg), SpO2 100 %.  GENERAL:  83 y.o.-year-old patient lying in the bed with no acute distress.  EYES: Pupils equal, round, reactive to light and accommodation. No scleral icterus. Extraocular muscles intact.  HEENT: Head atraumatic, normocephalic. Oropharynx and nasopharynx clear.  NECK:  Supple, no jugular venous distention. No thyroid enlargement, no tenderness.  LUNGS: Normal breath sounds bilaterally, no wheezing, rales,rhonchi or crepitation. No use of accessory muscles of respiration.  CARDIOVASCULAR: S1, S2 normal. Positive holosystolic murmur, rubs, or gallops.  ABDOMEN: Soft, nontender, nondistended. Bowel sounds present. No organomegaly or mass.  EXTREMITIES: No pedal edema, cyanosis, or clubbing.  NEUROLOGIC: Cranial nerves II through XII are intact. Muscle strength 5/5 in all extremities. Sensation intact. Gait not checked.  PSYCHIATRIC: The patient is alert and oriented x 3.  SKIN: No obvious rash, lesion, or ulcer.   LABORATORY  PANEL:   CBC  Recent Labs Lab 04/08/17 1124  WBC 9.6  HGB 11.9*  HCT 35.7*  PLT 442*  MCV 88.5  MCH 29.4  MCHC 33.2  RDW 16.5*  LYMPHSABS 1.9  MONOABS 1.1*  EOSABS 0.3  BASOSABS 0.1   ------------------------------------------------------------------------------------------------------------------  Chemistries   Recent Labs Lab 04/08/17 1124  NA 138  K 4.1  CL 103  CO2 23  GLUCOSE 132*  BUN 66*  CREATININE 3.45*  CALCIUM 9.2  AST 23  ALT 10*  ALKPHOS 101  BILITOT 0.8   ------------------------------------------------------------------------------------------------------------------ estimated creatinine clearance is 16.5 mL/min (A) (by C-G formula based on SCr of 3.45 mg/dL (H)). ------------------------------------------------------------------------------------------------------------------ No results for input(s): TSH, T4TOTAL, T3FREE, THYROIDAB in the last 72 hours.  Invalid input(s): FREET3   Coagulation profile No results for input(s): INR, PROTIME in the last 168 hours. ------------------------------------------------------------------------------------------------------------------- No results for input(s): DDIMER in the last 72 hours. -------------------------------------------------------------------------------------------------------------------  Cardiac Enzymes  Recent Labs Lab 04/08/17 1124  TROPONINI <0.03   ------------------------------------------------------------------------------------------------------------------ Invalid input(s): POCBNP  ---------------------------------------------------------------------------------------------------------------  Urinalysis    Component Value Date/Time   COLORURINE STRAW (A) 04/08/2017 1320   APPEARANCEUR CLEAR (A) 04/08/2017  1320   LABSPEC 1.006 04/08/2017 1320   PHURINE 5.0 04/08/2017 1320   GLUCOSEU NEGATIVE 04/08/2017 1320   HGBUR NEGATIVE 04/08/2017 1320   BILIRUBINUR NEGATIVE  04/08/2017 1320   KETONESUR NEGATIVE 04/08/2017 1320   PROTEINUR NEGATIVE 04/08/2017 1320   NITRITE NEGATIVE 04/08/2017 1320   LEUKOCYTESUR NEGATIVE 04/08/2017 1320     RADIOLOGY: Dg Chest Port 1 View  Result Date: 04/08/2017 CLINICAL DATA:  Pt sent from [facility] with c/o feeling dizzy with blurred vision for the past 3 days. Pt is hypotensive on arrival. EXAM: PORTABLE CHEST - 1 VIEW NONE AVAILABLE: NONE AVAILABLE FINDINGS: 2.6 cm nodular density at the right lung base.  Left lung clear. Heart size and mediastinal contours are within normal limits. No effusion. Visualized bones unremarkable. IMPRESSION: 1. 2.6 cm right lower lung nodule. Recommend CT chest for further characterization Electronically Signed   By: Lucrezia Europe M.D.   On: 04/08/2017 13:11    EKG: Orders placed or performed during the hospital encounter of 04/08/17  . EKG 12-Lead  . EKG 12-Lead  . EKG 12-Lead  . EKG 12-Lead    IMPRESSION AND PLAN: Patient is a 83 year old African-American male with history of severe mitral regurg presenting with dizziness hypotension  1. Acute renal failure on chronic kidney disease I will stop his diuretics including Lasix, HCTZ and Zaroxolyn I will stop his NSAIDs We will give him gentle diuresis Check renal ultrasound Nephrology consult  2. Hypotension suspected due to dehydration and multiple diuretic therapy I we'll give him IV fluids stop all blood pressure lowering medications  3. Coronary artery disease continue aspirin and Plavix  4. Hyperlipidemia unspecified continue Lipitor  5. Severe mitral regurg outpatient cardiology follow-up  6. 2.6 lung nodule outpatient follow-up  7. Misc Lovenox for dvt proph   All the records are reviewed and case discussed with ED provider. Management plans discussed with the patient, family and they are in agreement.  CODE STATUS: Code Status History    This patient does not have a recorded code status. Please follow your organizational  policy for patients in this situation.       TOTAL TIME TAKING CARE OF THIS PATIENT:55 minutes.    Dustin Flock M.D on 04/08/2017 at 2:54 PM  Between 7am to 6pm - Pager - [phone]  After 6pm go to [email or web address] - password [email or web address] [facility]  [facility]  Office  [phone]  CC: Primary care physician; [name], MD

## 2017-04-08 NOTE — ED Notes (Signed)
EKG performed, given to MD and exported.

## 2017-04-08 NOTE — ED Provider Notes (Signed)
[facility] Emergency Department Provider Note ____________________________________________   First MD Initiated Contact with Patient 04/08/17 1114     (approximate)  I have reviewed the triage vital signs and the nursing notes.   HISTORY  Chief Complaint Hypotension    HPI Andrew [name] is a 83 y.o. male with history of hypertension, hyperlipidemia, coronary artery disease, and mitral regurgitation, who presents with hypotension sent from clinic. Onset is uncertain. Patient states that he has felt dizzy, meaning lightheaded, when he stands up for the last 3 or 4 days but usually feels well when he lies down. Patient states the symptoms are intermittent. Patient denies any associated symptoms including chest pain, fever, or GI symptoms. He reports mild shortness of breath intermittently.  States she has never had this symptom before.  Past Medical History:  Diagnosis Date  . Arteriosclerosis of coronary artery 01/27/2014  . Benign essential HTN 12/11/2013  . Carpal tunnel syndrome   . Coronary atherosclerosis of native coronary artery   . Disorder of mitral valve 12/11/2013  . Encounter for long-term (current) use of antiplatelets/antithrombotics   . Gout   . Hyperlipidemia   . Hypertension   . Mitral valve disorder   . Osteoarthritis   . Prostate cancer [facility])     Patient Active Problem List   Diagnosis Date Noted  . PAD (peripheral artery disease) ([place]) 06/04/2016  . Colon polyp 06/29/2015  . Malignant neoplasm of prostate ([place]) 11/14/2014  . Combined fat and carbohydrate induced hyperlipemia 07/29/2014  . Long term current use of antithrombotics/antiplatelets 01/27/2014  . Arteriosclerosis of coronary artery 01/27/2014  . Carpal tunnel syndrome 12/11/2013  . Benign essential HTN 12/11/2013  . Lesion of ulnar nerve 12/11/2013  . Disorder of mitral valve 12/11/2013  . Arthritis, degenerative 12/11/2013  . Hereditary and idiopathic neuropathy  12/11/2013    Past Surgical History:  Procedure Laterality Date  . COLONOSCOPY    . COLONOSCOPY WITH PROPOFOL N/A 07/23/2016   Procedure: COLONOSCOPY WITH PROPOFOL;  Surgeon: Manya Silvas, MD;  Location: [facility] ENDOSCOPY;  Service: Endoscopy;  Laterality: N/A;  . CORONARY ANGIOPLASTY WITH STENT PLACEMENT    . ESOPHAGOGASTRODUODENOSCOPY (EGD) WITH PROPOFOL N/A 07/23/2016   Procedure: ESOPHAGOGASTRODUODENOSCOPY (EGD) WITH PROPOFOL;  Surgeon: Manya Silvas, MD;  Location: [facility] ENDOSCOPY;  Service: Endoscopy;  Laterality: N/A;  . PROSTATE BIOPSY      Prior to Admission medications   Medication Sig Start Date End Date Taking? Authorizing Provider  allopurinol (ZYLOPRIM) 300 MG tablet Take 400 mg by mouth daily. 02/20/17  Yes [provider]  amLODipine (NORVASC) 10 MG tablet TAKE 1 TABLET (10 MG TOTAL) BY MOUTH ONCE DAILY. 04/24/15  Yes [provider]  aspirin 325 MG tablet Take 325 mg by mouth daily.    Yes [provider]  atorvastatin (LIPITOR) 40 MG tablet Take 40 mg by mouth daily. 04/24/15  Yes [provider]  clopidogrel (PLAVIX) 75 MG tablet Take 75 mg by mouth daily. 04/24/15  Yes [provider]  finasteride (PROSCAR) 5 MG tablet Take 5 mg by mouth daily. 04/13/15  Yes [provider]  furosemide (LASIX) 20 MG tablet Take 40 mg by mouth daily. 02/20/17  Yes [provider]  hydrochlorothiazide (HYDRODIURIL) 25 MG tablet Take 25 mg by mouth daily.  07/18/15 04/08/18 Yes [provider]  meloxicam (MOBIC) 15 MG tablet Take 15 mg by mouth daily.   Yes [provider]  metolazone (ZAROXOLYN) 5 MG tablet Take 5 mg  by mouth daily. 03/28/17  Yes [provider]  metoprolol (LOPRESSOR) 50 MG tablet Take 50 mg by mouth 2 (two) times daily. 04/24/15  Yes [provider]  naproxen sodium (ANAPROX) 220 MG tablet Take 220 mg by mouth 2 (two) times daily with a meal.   Yes [provider]    HYDROcodone-acetaminophen (NORCO/VICODIN) 5-325 MG tablet 1 tab po qhs prn Patient not taking: Reported on 04/08/2017 09/26/16   Norval Gable, MD  predniSONE (DELTASONE) 20 MG tablet Take 1 tablet (20 mg total) by mouth daily. Patient not taking: Reported on 04/08/2017 09/26/16   Norval Gable, MD    Allergies Patient has no known allergies.  Family History  Problem Relation Age of Onset  . Cancer Mother        stomach and uterus  . Cancer Sister        breast    Social History Social History  Substance Use Topics  . Smoking status: Never Smoker  . Smokeless tobacco: Never Used  . Alcohol use No    Review of Systems  Constitutional: No fever/chills Eyes: No visual changes. ENT: No sore throat. Cardiovascular: Denies chest pain. Respiratory: Positive for mild shortness of breath. Gastrointestinal: No nausea, no vomiting.  No diarrhea.  Genitourinary: Negative for dysuria.  Musculoskeletal: Negative for back pain. Skin: Negative for rash. Neurological: Negative for headaches, focal weakness or numbness.   ____________________________________________   PHYSICAL EXAM:  VITAL SIGNS: ED Triage Vitals  Enc Vitals Group     BP 04/08/17 1110 (!) 64/46     Pulse Rate 04/08/17 1109 70     Resp 04/08/17 1109 16     Temp --      Temp Source 04/08/17 1109 Oral     SpO2 04/08/17 1109 99 %     Weight 04/08/17 1110 156 lb (70.8 kg)     Height 04/08/17 1110 5\' 9"  (1.753 m)     Head Circumference --      Peak Flow --      Pain Score --      Pain Loc --      Pain Edu? --      Excl. in [place]? --     Constitutional: Alert and oriented. Well appearing and in no acute distress. Eyes: Conjunctivae are normal.  Head: Atraumatic. Nose: No congestion/rhinnorhea. Mouth/Throat: Mucous membranes are moist.   Neck: Normal range of motion.  Cardiovascular: Normal rate, regular rhythm. Systolic murmur.  Good peripheral circulation. Respiratory: Normal respiratory effort.  No  retractions. Lungs CTAB. Gastrointestinal: Soft and nontender. No distention.  Genitourinary: No CVA tenderness. Musculoskeletal: No lower extremity edema.  Extremities warm and well perfused.  Neurologic:  Normal speech and language. No gross focal neurologic deficits are appreciated.  Skin:  Skin is warm and dry. No rash noted. Psychiatric: Mood and affect are normal. Speech and behavior are normal.  ____________________________________________   LABS (all labs ordered are listed, but only abnormal results are displayed)  Labs Reviewed  LACTIC ACID, PLASMA - Abnormal; Notable for the following:       Result Value   Lactic Acid, Venous 2.5 (*)    All other components within normal limits  COMPREHENSIVE METABOLIC PANEL - Abnormal; Notable for the following:    Glucose, Bld 132 (*)    BUN 66 (*)    Creatinine, Ser 3.45 (*)    ALT 10 (*)    GFR calc non Af Amer 15 (*)    GFR calc Af Wyvonnia Lora  18 (*)    All other components within normal limits  CBC WITH DIFFERENTIAL/PLATELET - Abnormal; Notable for the following:    RBC 4.04 (*)    Hemoglobin 11.9 (*)    HCT 35.7 (*)    RDW 16.5 (*)    Platelets 442 (*)    Monocytes Absolute 1.1 (*)    All other components within normal limits  URINALYSIS, ROUTINE W REFLEX MICROSCOPIC - Abnormal; Notable for the following:    Color, Urine STRAW (*)    APPearance CLEAR (*)    All other components within normal limits  TROPONIN I  BRAIN NATRIURETIC PEPTIDE  LACTIC ACID, PLASMA   ____________________________________________  EKG  ED ECG REPORT I, Arta Silence, the attending physician, personally viewed and interpreted this ECG.  Date: 04/08/2017 EKG Time: 11:15 Rate:65 Rhythm: normal sinus rhythm QRS Axis: normal Intervals: normal ST/T Wave abnormalities: LVH Narrative Interpretation: no acute  findings  ____________________________________________  RADIOLOGY    ____________________________________________   PROCEDURES  Procedure(s) performed: No    Critical Care performed: No ____________________________________________   INITIAL IMPRESSION / ASSESSMENT AND PLAN / ED COURSE  Pertinent labs & imaging results that were available during my care of the patient were reviewed by me and considered in my medical decision making (see chart for details).  83 year old male past medical history as noted presents with hypotension from the clinic as well as subjective lightheadedness especially when he stands up intermittently for the last 3-4 days. Mild shortness of breath and no other associated symptoms. In the ED, patient is hypotensive although on lying down blood pressure is in the 90s.  Other vital signs are normal. Patient is very well-appearing, with overall good perfusion, and moist mucous membranes. Systolic murmur consistent with known MR, exam is otherwise unremarkable.overall differential includes infection/sepsis, dehydration or other metabolic cause, less likely cardiac.  Plan: Infection/sepsis workup, cardiac enzymes, chest x-ray, IV fluids and reassess.    ----------------------------------------- 2:35 PM on 04/08/2017 -----------------------------------------  Blood pressure has normalized. Patient continues to feel well.  Workup reveals significant change in creatinine from last labs 2 months ago, so will admit for AKI.  ____________________________________________   FINAL CLINICAL IMPRESSION(S) / ED DIAGNOSES  Final diagnoses:  AKI (acute kidney injury) ([place])  Hypotension, unspecified hypotension type      NEW MEDICATIONS STARTED DURING THIS VISIT:  New Prescriptions   No medications on file     Note:  This document was prepared using Dragon voice recognition software and may include unintentional dictation errors.    Arta Silence,  MD 04/08/17 1435

## 2017-04-08 NOTE — Consult Note (Signed)
[facility] CONSULT NOTE    Date: 04/08/2017                  Patient Name:  Andrew [name]  MRN: [id number]  DOB: 09-12-1932  Age / Sex: 83 y.o., male         PCP: [name], MD                 Service Requesting Consult: Hospitalist                 Reason for Consult: Acute renal failure, CKD stage III            History of Present Illness: Patient is a 83 y.o. male with a PMHx of Hypertension, carpal tunnel syndrome, mitral regurgitation, gout, hyperlipidemia, hypertension, osteoarthritis, prostate cancer, who was admitted to [facility] on 04/08/2017 for evaluation of dizziness. The patient has been feeling dizzy for at least 4 days. He went to the urgent care clinic for these symptoms and was noted as having initial blood pressure in the 60s.  The patient is noted to be on multiple diuretics including furosemide, HCTZ, metolazone. He is also on meloxicam as well as naproxen.  He is unclear as to whether he's had adequate by mouth fluid intake over the past several days. He has been started on IV fluid hydration.   Medications: Outpatient medications: Prescriptions Prior to Admission  Medication Sig Dispense Refill Last Dose  . allopurinol (ZYLOPRIM) 100 MG tablet Take 400 mg by mouth daily.   04/08/2017 at 0800  . amLODipine (NORVASC) 10 MG tablet TAKE 1 TABLET (10 MG TOTAL) BY MOUTH ONCE DAILY.  11 04/08/2017 at 0800  . aspirin 325 MG tablet Take 325 mg by mouth daily.    04/08/2017 at 0800  . atorvastatin (LIPITOR) 40 MG tablet Take 40 mg by mouth daily.  6 04/08/2017 at 1000  . clopidogrel (PLAVIX) 75 MG tablet Take 75 mg by mouth daily.  5 04/08/2017 at 0800  . finasteride (PROSCAR) 5 MG tablet Take 5 mg by mouth daily.  11 04/08/2017 at 0800  . furosemide (LASIX) 20 MG tablet Take 40 mg by mouth daily.   04/08/2017 at 0800  . hydrochlorothiazide (HYDRODIURIL) 25 MG tablet Take 25 mg by mouth daily.    04/08/2017 at 0800  . meloxicam (MOBIC) 15 MG tablet Take 15  mg by mouth daily.   04/07/2017 at 0800  . metolazone (ZAROXOLYN) 5 MG tablet Take 5 mg by mouth daily.   04/08/2017 at 0800  . metoprolol (LOPRESSOR) 50 MG tablet Take 50 mg by mouth 2 (two) times daily.  6 04/08/2017 at 0800  . naproxen sodium (ANAPROX) 220 MG tablet Take 220 mg by mouth 2 (two) times daily with a meal.   PRN at PRN  . HYDROcodone-acetaminophen (NORCO/VICODIN) 5-325 MG tablet 1 tab po qhs prn (Patient not taking: Reported on 04/08/2017) 6 tablet 0 Completed Course at Unknown time  . predniSONE (DELTASONE) 20 MG tablet Take 1 tablet (20 mg total) by mouth daily. (Patient not taking: Reported on 04/08/2017) 5 tablet 0 Completed Course at Unknown time    Current medications: Current Facility-Administered Medications  Medication Dose Route Frequency Provider Last Rate Last Dose  . 0.9 %  sodium chloride infusion   Intravenous Continuous Dustin Flock, MD 100 mL/hr at 04/08/17 1635    . acetaminophen (TYLENOL) tablet 650 mg  650 mg Oral Q6H PRN Dustin Flock, MD  Or  . acetaminophen (TYLENOL) suppository 650 mg  650 mg Rectal Q6H PRN Dustin Flock, MD      . allopurinol (ZYLOPRIM) tablet 400 mg  400 mg Oral Daily Dustin Flock, MD      . Derrill Memo ON 04/09/2017] aspirin tablet 325 mg  325 mg Oral Daily Dustin Flock, MD      . atorvastatin (LIPITOR) tablet 40 mg  40 mg Oral Daily Dustin Flock, MD      . Derrill Memo ON 04/09/2017] clopidogrel (PLAVIX) tablet 75 mg  75 mg Oral Daily Dustin Flock, MD      . Derrill Memo ON 04/09/2017] finasteride (PROSCAR) tablet 5 mg  5 mg Oral Daily Dustin Flock, MD      . heparin injection 5,000 Units  5,000 Units Subcutaneous Q8H Dustin Flock, MD   5,000 Units at 04/08/17 1635  . HYDROcodone-acetaminophen (NORCO/VICODIN) 5-325 MG per tablet 1 tablet  1 tablet Oral Q6H PRN Dustin Flock, MD      . ondansetron (ZOFRAN) tablet 4 mg  4 mg Oral Q6H PRN Dustin Flock, MD       Or  . ondansetron (ZOFRAN) injection 4 mg  4 mg Intravenous Q6H  PRN Dustin Flock, MD          Allergies: No Known Allergies    Past Medical History: Past Medical History:  Diagnosis Date  . Arteriosclerosis of coronary artery 01/27/2014  . Benign essential HTN 12/11/2013  . Carpal tunnel syndrome   . Coronary atherosclerosis of native coronary artery   . Disorder of mitral valve 12/11/2013  . Encounter for long-term (current) use of antiplatelets/antithrombotics   . Gout   . Hyperlipidemia   . Hypertension   . Mitral valve disorder   . Osteoarthritis   . Prostate cancer [facility])      Past Surgical History: Past Surgical History:  Procedure Laterality Date  . COLONOSCOPY    . COLONOSCOPY WITH PROPOFOL N/A 07/23/2016   Procedure: COLONOSCOPY WITH PROPOFOL;  Surgeon: Manya Silvas, MD;  Location: [facility] ENDOSCOPY;  Service: Endoscopy;  Laterality: N/A;  . CORONARY ANGIOPLASTY WITH STENT PLACEMENT    . ESOPHAGOGASTRODUODENOSCOPY (EGD) WITH PROPOFOL N/A 07/23/2016   Procedure: ESOPHAGOGASTRODUODENOSCOPY (EGD) WITH PROPOFOL;  Surgeon: Manya Silvas, MD;  Location: [facility] ENDOSCOPY;  Service: Endoscopy;  Laterality: N/A;  . PROSTATE BIOPSY       Family History: Family History  Problem Relation Age of Onset  . Cancer Mother        stomach and uterus  . Cancer Sister        breast     Social History: Social History   Social History  . Marital status: Married    Spouse name: N/A  . Number of children: N/A  . Years of education: N/A   Occupational History  . Not on file.   Social History Main Topics  . Smoking status: Never Smoker  . Smokeless tobacco: Never Used  . Alcohol use No  . Drug use: No  . Sexual activity: No   Other Topics Concern  . Not on file   Social History Narrative  . No narrative on file     Review of Systems: Review of Systems  Constitutional: Positive for malaise/fatigue. Negative for chills and fever.  HENT: Negative for ear discharge, ear pain and hearing loss.   Eyes: Negative for blurred  vision and double vision.  Respiratory: Positive for shortness of breath. Negative for cough, hemoptysis and sputum production.   Cardiovascular: Negative for chest  pain, palpitations and orthopnea.  Gastrointestinal: Negative for abdominal pain, heartburn, nausea and vomiting.  Genitourinary: Negative for dysuria, frequency and urgency.  Musculoskeletal: Negative for joint pain and myalgias.  Skin: Negative for itching and rash.  Neurological: Positive for dizziness and weakness. Negative for focal weakness.  Endo/Heme/Allergies: Negative for polydipsia. Does not bruise/bleed easily.  Psychiatric/Behavioral: Negative for depression. The patient is not nervous/anxious.      Vital Signs: Blood pressure 130/66, pulse 60, temperature 97.7 F (36.5 C), temperature source Oral, resp. rate 12, height 5\' 9"  (1.753 m), weight 70.8 kg (156 lb), SpO2 98 %.  Weight trends: Filed Weights   04/08/17 1110  Weight: 70.8 kg (156 lb)    Physical Exam: General: NAD, sitting up in bed  Head: Normocephalic, atraumatic.  Eyes: Anicteric, EOMI  Nose: Mucous membranes moist, not inflammed, nonerythematous.  Throat: Oropharynx nonerythematous, no exudate appreciated.   Neck: Supple, trachea midline.  Lungs:  Normal respiratory effort. Clear to auscultation BL without crackles or wheezes.  Heart: RRR. S1S2 no rubs  Abdomen:  BS normoactive. Soft, Nondistended, non-tender.  No masses or organomegaly.  Extremities: No pretibial edema.  Neurologic: A&O X3, Motor strength is 5/5 in the all 4 extremities  Skin: No visible rashes, scars.    Lab results: Basic Metabolic Panel:  Recent Labs Lab 04/08/17 1124  NA 138  K 4.1  CL 103  CO2 23  GLUCOSE 132*  BUN 66*  CREATININE 3.45*  CALCIUM 9.2    Liver Function Tests:  Recent Labs Lab 04/08/17 1124  AST 23  ALT 10*  ALKPHOS 101  BILITOT 0.8  PROT 7.1  ALBUMIN 3.7   No results for input(s): LIPASE, AMYLASE in the last 168 hours. No  results for input(s): AMMONIA in the last 168 hours.  CBC:  Recent Labs Lab 04/08/17 1124  WBC 9.6  NEUTROABS 6.3  HGB 11.9*  HCT 35.7*  MCV 88.5  PLT 442*    Cardiac Enzymes:  Recent Labs Lab 04/08/17 1124  TROPONINI <0.03    BNP: Invalid input(s): POCBNP  CBG: No results for input(s): GLUCAP in the last 168 hours.  Microbiology: No results found for this or any previous visit.  Coagulation Studies: No results for input(s): LABPROT, INR in the last 72 hours.  Urinalysis:  Recent Labs  04/08/17 1320  COLORURINE STRAW*  LABSPEC 1.006  PHURINE 5.0  GLUCOSEU NEGATIVE  HGBUR NEGATIVE  BILIRUBINUR NEGATIVE  KETONESUR NEGATIVE  PROTEINUR NEGATIVE  NITRITE NEGATIVE  LEUKOCYTESUR NEGATIVE      Imaging: Dg Chest Port 1 View  Result Date: 04/08/2017 CLINICAL DATA:  Pt sent from [facility] with c/o feeling dizzy with blurred vision for the past 3 days. Pt is hypotensive on arrival. EXAM: PORTABLE CHEST - 1 VIEW NONE AVAILABLE: NONE AVAILABLE FINDINGS: 2.6 cm nodular density at the right lung base.  Left lung clear. Heart size and mediastinal contours are within normal limits. No effusion. Visualized bones unremarkable. IMPRESSION: 1. 2.6 cm right lower lung nodule. Recommend CT chest for further characterization Electronically Signed   By: Lucrezia Europe M.D.   On: 04/08/2017 13:11      Assessment & Plan: Pt is a 83 y.o. male with a PMHx of Hypertension, carpal tunnel syndrome, mitral regurgitation, gout, hyperlipidemia, hypertension, osteoarthritis, prostate cancer, who was admitted to [facility] on 04/08/2017 for evaluation of dizziness.   1. Acute renal failure/chronic kidney disease stage III. The patient's baseline creatinine is 1.6. Suspect acute renal failure is now related to poor  by mouth intake as well as multiple diuretics including Lasix, metolazone, and HCTZ.  NSAIDs including meloxicam as well as naproxen also likely contributing. Agree with discontinuation of these.  Continue IV fluid hydration. We will check renal ultrasound as well as SPEP and UPEP. No urgent indication for dialysis at the moment. Avoid nephrotoxins going forward.  2. Hypotension. Initial blood pressure was 64/46. Continue IV fluid hydration. Blood pressure has improved somewhat.  3. Anemia of chronic kidney disease. Hemoglobin currently 11.9. May drop with hydration. Continue to monitor.  4. Thanks for consultation.

## 2017-04-08 NOTE — ED Triage Notes (Signed)
Pt sent from [facility] with c/o feeling dizzy with blurred vision for the past 3 days. Pt is hypotensive on arrival..

## 2017-04-09 ENCOUNTER — Inpatient Hospital Stay: Payer: Medicare Other

## 2017-04-09 LAB — BASIC METABOLIC PANEL
ANION GAP: 6 (ref 5–15)
BUN: 54 mg/dL — ABNORMAL HIGH (ref 6–20)
CALCIUM: 8.3 mg/dL — AB (ref 8.9–10.3)
CO2: 21 mmol/L — AB (ref 22–32)
CREATININE: 2.41 mg/dL — AB (ref 0.61–1.24)
Chloride: 112 mmol/L — ABNORMAL HIGH (ref 101–111)
GFR, EST AFRICAN AMERICAN: 27 mL/min — AB (ref >60)
GFR, EST NON AFRICAN AMERICAN: 23 mL/min — AB (ref >60)
Glucose, Bld: 91 mg/dL (ref 65–99)
Potassium: 3.5 mmol/L (ref 3.5–5.1)
Sodium: 139 mmol/L (ref 135–145)

## 2017-04-09 LAB — CBC
HCT: 30.6 % — ABNORMAL LOW (ref 40.0–52.0)
HEMOGLOBIN: 10.4 g/dL — AB (ref 13.0–18.0)
MCH: 29.9 pg (ref 26.0–34.0)
MCHC: 34.2 g/dL (ref 32.0–36.0)
MCV: 87.5 fL (ref 80.0–100.0)
PLATELETS: 382 10*3/uL (ref 150–440)
RBC: 3.5 MIL/uL — AB (ref 4.40–5.90)
RDW: 16.8 % — ABNORMAL HIGH (ref 11.5–14.5)
WBC: 8.6 10*3/uL (ref 3.8–10.6)

## 2017-04-09 MED ORDER — PREMIER PROTEIN SHAKE
11.0000 [oz_av] | ORAL | Status: DC
  Administered 2017-04-09: 11 [oz_av] via ORAL

## 2017-04-09 MED ORDER — ADULT MULTIVITAMIN W/MINERALS CH
1.0000 | ORAL_TABLET | Freq: Every day | ORAL | Status: DC
  Administered 2017-04-09 – 2017-04-10 (×2): 1 via ORAL
  Filled 2017-04-09 (×2): qty 1

## 2017-04-09 NOTE — Progress Notes (Signed)
[facility]  ROUNDING NOTE   Subjective:  Patient seen at bedside. Renal function has improved with IV fluid hydration. Reports that his dizziness has improved. Renal ultrasound reveals cysts in both kidneys.  Objective:  Vital signs in last 24 hours:  Temp:  [97.7 F (36.5 C)-99 F (37.2 C)] 98 F (36.7 C) (08/28 0918) Pulse Rate:  [60-74] 74 (08/28 0918) Resp:  [12-22] 18 (08/28 0918) BP: (112-132)/(52-67) 132/55 (08/28 0918) SpO2:  [95 %-100 %] 100 % (08/28 0918) Weight:  [71 kg (156 lb 8 oz)] 71 kg (156 lb 8 oz) (08/28 1117)  Weight change:  Filed Weights   04/08/17 1110 04/09/17 1117  Weight: 70.8 kg (156 lb) 71 kg (156 lb 8 oz)    Intake/Output: I/O last 3 completed shifts: In: 2585 [I.V.:1253; IV Piggyback:2000] Out: 275 [Urine:275]   Intake/Output this shift:  Total I/O In: 240 [P.O.:240] Out: 400 [Urine:400]  Physical Exam: General: No acute distress  Head: Normocephalic, atraumatic. Moist oral mucosal membranes  Eyes: Anicteric  Neck: Supple, trachea midline  Lungs:  Clear to auscultation, normal effort  Heart: S1S2 no rubs  Abdomen:  Soft, nontender, bowel sounds present  Extremities: No peripheral edema.  Neurologic: Awake, alert, following commands  Skin: No lesions       Basic Metabolic Panel:  Recent Labs Lab 04/08/17 1124 04/09/17 0421  NA 138 139  K 4.1 3.5  CL 103 112*  CO2 23 21*  GLUCOSE 132* 91  BUN 66* 54*  CREATININE 3.45* 2.41*  CALCIUM 9.2 8.3*    Liver Function Tests:  Recent Labs Lab 04/08/17 1124  AST 23  ALT 10*  ALKPHOS 101  BILITOT 0.8  PROT 7.1  ALBUMIN 3.7   No results for input(s): LIPASE, AMYLASE in the last 168 hours. No results for input(s): AMMONIA in the last 168 hours.  CBC:  Recent Labs Lab 04/08/17 1124 04/09/17 0421  WBC 9.6 8.6  NEUTROABS 6.3  --   HGB 11.9* 10.4*  HCT 35.7* 30.6*  MCV 88.5 87.5  PLT 442* 382    Cardiac Enzymes:  Recent Labs Lab 04/08/17 1124   TROPONINI <0.03    BNP: Invalid input(s): POCBNP  CBG: No results for input(s): GLUCAP in the last 168 hours.  Microbiology: No results found for this or any previous visit.  Coagulation Studies: No results for input(s): LABPROT, INR in the last 72 hours.  Urinalysis:  Recent Labs  04/08/17 1320  COLORURINE STRAW*  LABSPEC 1.006  PHURINE 5.0  GLUCOSEU NEGATIVE  HGBUR NEGATIVE  BILIRUBINUR NEGATIVE  KETONESUR NEGATIVE  PROTEINUR NEGATIVE  NITRITE NEGATIVE  LEUKOCYTESUR NEGATIVE      Imaging: US Renal  Result Date: 04/09/2017 CLINICAL DATA:  Acute renal failure EXAM: RENAL / URINARY TRACT ULTRASOUND COMPLETE COMPARISON:  None. FINDINGS: Right Kidney: Length: 10.8 cm. 7 cm cyst is noted in the mid to lower pole of the right kidney. No obstructive changes are seen. No calculi are seen. Left Kidney: Length: 11.8 cm. Cystic areas noted in the midportion of the left kidney measuring 1.6 cm. This may represent a mildly septated single cyst or 2 adjacent small cysts. Bladder: Bladder is well distended.  Prostate is enlarged. IMPRESSION: Cystic changes in the kidneys bilaterally. No acute abnormality is noted. Electronically Signed   By: Inez Catalina M.D.   On: 04/09/2017 09:03   Dg Chest Port 1 View  Result Date: 04/08/2017 CLINICAL DATA:  Pt sent from [facility] with c/o feeling dizzy with blurred  vision for the past 3 days. Pt is hypotensive on arrival. EXAM: PORTABLE CHEST - 1 VIEW NONE AVAILABLE: NONE AVAILABLE FINDINGS: 2.6 cm nodular density at the right lung base.  Left lung clear. Heart size and mediastinal contours are within normal limits. No effusion. Visualized bones unremarkable. IMPRESSION: 1. 2.6 cm right lower lung nodule. Recommend CT chest for further characterization Electronically Signed   By: Lucrezia Europe M.D.   On: 04/08/2017 13:11     Medications:   . sodium chloride 100 mL/hr at 04/09/17 1057  . allopurinol (ZYLOPRIM) tablet 400 mg     . aspirin  325 mg Oral  Daily  . atorvastatin  40 mg Oral Daily  . clopidogrel  75 mg Oral Daily  . finasteride  5 mg Oral Daily  . heparin  5,000 Units Subcutaneous Q8H  . multivitamin with minerals  1 tablet Oral Daily  . protein supplement shake  11 oz Oral Q24H   acetaminophen **OR** acetaminophen, HYDROcodone-acetaminophen, ondansetron **OR** ondansetron (ZOFRAN) IV  Assessment/ Plan:  83 y.o. male with a PMHx of Hypertension, carpal tunnel syndrome, mitral regurgitation, gout, hyperlipidemia, hypertension, osteoarthritis, prostate cancer, who was admitted to [facility] on 04/08/2017 for evaluation of dizziness.   1. Acute renal failure/chronic kidney disease stage III. The patient's baseline creatinine is 1.6. Suspect acute renal failure is now related to poor by mouth intake as well as multiple diuretics including Lasix, metolazone, and HCTZ.  NSAIDs including meloxicam as well as naproxen also likely contributing.  -  Renal function improved with IV fluid hydration. Renal ultrasound reveals cysts in both kidneys. Continue hydration for 1 additional day. Follow-up renal function in the a.m.  2. Hypotension. blood pressure better today at 132/55. Continue to monitor.  3. Anemia of chronic kidney disease.  Hemoglobin has come down a bit as predicted. Hemoglobin currently 10.4 and likely delusional. We will continue to monitor CBC as an outpatient.   LOS: 1 Andrew [name] 8/28/201812:02 PM

## 2017-04-09 NOTE — Progress Notes (Signed)
[place] [facility] [place] NAME: Andrew [name]    MR#:  [id number]  DATE OF BIRTH:  05-Feb-1933  SUBJECTIVE:   Came in with increasing weakness and a passing out spell. Found to be severely dehydrated. Since a lot better. Lives at home with his dog. He denies any injury. REVIEW OF SYSTEMS:   Review of Systems  Constitutional: Negative for chills, fever and weight loss.  HENT: Negative for ear discharge, ear pain and nosebleeds.   Eyes: Negative for blurred vision, pain and discharge.  Respiratory: Negative for sputum production, shortness of breath, wheezing and stridor.   Cardiovascular: Negative for chest pain, palpitations, orthopnea and PND.  Gastrointestinal: Negative for abdominal pain, diarrhea, nausea and vomiting.  Genitourinary: Negative for frequency and urgency.  Musculoskeletal: Negative for back pain and joint pain.  Neurological: Positive for weakness. Negative for sensory change, speech change and focal weakness.  Psychiatric/Behavioral: Negative for depression and hallucinations. The patient is not nervous/anxious.    Tolerating Diet:yes Tolerating PT: pending  DRUG ALLERGIES:  No Known Allergies  VITALS:  Blood pressure (!) 116/53, pulse 80, temperature 98.8 F (37.1 C), temperature source Oral, resp. rate 18, height 5\' 9"  (1.753 m), weight 71 kg (156 lb 8 oz), SpO2 99 %.  PHYSICAL EXAMINATION:   Physical Exam  GENERAL:  83 y.o.-year-old patient lying in the bed with no acute distress.  EYES: Pupils equal, round, reactive to light and accommodation. No scleral icterus. Extraocular muscles intact.  HEENT: Head atraumatic, normocephalic. Oropharynx and nasopharynx clear.  NECK:  Supple, no jugular venous distention. No thyroid enlargement, no tenderness.  LUNGS: Normal breath sounds bilaterally, no wheezing, rales, rhonchi. No use of accessory muscles of respiration.  CARDIOVASCULAR: S1, S2 normal. No murmurs, rubs, or  gallops.  ABDOMEN: Soft, nontender, nondistended. Bowel sounds present. No organomegaly or mass.  EXTREMITIES: No cyanosis, clubbing or edema b/l.    NEUROLOGIC: Cranial nerves II through XII are intact. No focal Motor or sensory deficits b/l.   PSYCHIATRIC:  patient is alert and oriented x 3.  SKIN: No obvious rash, lesion, or ulcer.   LABORATORY PANEL:  CBC  Recent Labs Lab 04/09/17 0421  WBC 8.6  HGB 10.4*  HCT 30.6*  PLT 382    Chemistries   Recent Labs Lab 04/08/17 1124 04/09/17 0421  NA 138 139  K 4.1 3.5  CL 103 112*  CO2 23 21*  GLUCOSE 132* 91  BUN 66* 54*  CREATININE 3.45* 2.41*  CALCIUM 9.2 8.3*  AST 23  --   ALT 10*  --   ALKPHOS 101  --   BILITOT 0.8  --    Cardiac Enzymes  Recent Labs Lab 04/08/17 1124  TROPONINI <0.03   RADIOLOGY:  US Renal  Result Date: 04/09/2017 CLINICAL DATA:  Acute renal failure EXAM: RENAL / URINARY TRACT ULTRASOUND COMPLETE COMPARISON:  None. FINDINGS: Right Kidney: Length: 10.8 cm. 7 cm cyst is noted in the mid to lower pole of the right kidney. No obstructive changes are seen. No calculi are seen. Left Kidney: Length: 11.8 cm. Cystic areas noted in the midportion of the left kidney measuring 1.6 cm. This may represent a mildly septated single cyst or 2 adjacent small cysts. Bladder: Bladder is well distended.  Prostate is enlarged. IMPRESSION: Cystic changes in the kidneys bilaterally. No acute abnormality is noted. Electronically Signed   By: Andrew [name] M.D.   On: 04/09/2017 09:03   Dg Chest Port 1  View  Result Date: 04/08/2017 CLINICAL DATA:  Pt sent from [facility] with c/o feeling dizzy with blurred vision for the past 3 days. Pt is hypotensive on arrival. EXAM: PORTABLE CHEST - 1 VIEW NONE AVAILABLE: NONE AVAILABLE FINDINGS: 2.6 cm nodular density at the right lung base.  Left lung clear. Heart size and mediastinal contours are within normal limits. No effusion. Visualized bones unremarkable. IMPRESSION: 1. 2.6 cm right lower  lung nodule. Recommend CT chest for further characterization Electronically Signed   By: Andrew [name] M.D.   On: 04/08/2017 13:11   ASSESSMENT AND PLAN:  Andrew [name]  is a 83 y.o. male with a known history of Severe mitral regurg, essential hypertension, coronary artery disease, gout, hyperlipidemia, hypertension and prostate cancer who is presenting to the hospital with complaint of dizziness. Patient states that his been feeling like this for the past few days. He has not passed out. He was seen at the urgent clinic and was noted to have blood pressure initially in the 60s.  1. Acute renal failure on chronic kidney disease stage III - will stop his diuretics including Lasix, HCTZ and Zaroxolyn -will stop his NSAIDs -patient came in with creatinine of 3.45--- IV fluids----2.41 -Baseline creatinine 1.6 -Check renal ultrasound---results pending -Nephrology consult---appreciated  2. Hypotension suspected due to dehydration and multiple diuretic therapy -blood pressure much improved. Hold BP meds for today  3. Coronary artery disease continue aspirin and Plavix  4. Hyperlipidemia unspecified continue Lipitor  5. Severe mitral regurg outpatient cardiology follow-up  6. 2.6 lung nodule outpatient follow-up  7. Misc Lovenox for dvt proph  Physical therapy to see patient Management for discharge planning  Case discussed with Care Management/Social Worker. Management plans discussed with the patient, family and they are in agreement.  CODE STATUS:full  DVT Prophylaxis: Lovenox renal dosing  TOTAL TIME TAKING CARE OF THIS PATIENT: 30 minutes.  >50% time spent on counselling and coordination of care  POSSIBLE D/C IN 1-2 DAYS, DEPENDING ON CLINICAL CONDITION.  Note: This dictation was prepared with Dragon dictation along with smaller phrase technology. Any transcriptional errors that result from this process are unintentional.  Andrew [name] M.D on 04/09/2017 at 2:25 PM  Between 7am  to 6pm - Pager - [phone]  After 6pm go to [email or web address] - password [email or web address]  Office  [phone]  CC: Primary care physician; [name], MD

## 2017-04-09 NOTE — Progress Notes (Signed)
Initial Nutrition Assessment  DOCUMENTATION CODES:   Not applicable  INTERVENTION:   Premier Protein BID, each supplement provides 160kcal and 30g protein.   MVI  NUTRITION DIAGNOSIS:   Unintentional weight loss related to poor appetite as evidenced by per patient/family report, 8 percent weight loss in 6 months.  GOAL:   Patient will meet greater than or equal to 90% of their needs  MONITOR:   PO intake, Supplement acceptance, Labs, Weight trends  REASON FOR ASSESSMENT:   Malnutrition Screening Tool    ASSESSMENT:   83 y.o. male with a known history of Severe mitral regurg, essential hypertension, coronary artery disease, gout, hyperlipidemia, hypertension and prostate cancer who is presenting to the hospital with complaint of dizziness.    Met with pt in room today. Pt reports chronic poor appetite for the past few years. Pt eats small meals at home. Pt reports that he used to drink 2 Ensure per day but he stopped because he felt like they were causing him to develop constipation. Per chart, pt has lost 14lbs(8%) in 6 months; this is significant given history of poor appetite. Pt eating 75% of meals in hospital currently. RD encouraged adequate protein intake to preserve lean muscle mass; pt would like to try Premier Protein.    Medications reviewed and include: allopurinol, aspirin, plavix, heparin, lipitor  Labs reviewed: Cl 112(H), BUN 54(H), creat 2.41(H), Ca 8.3(L)  Nutrition-Focused physical exam completed. Findings are no fat depletion, moderate muscle depletion in temporal regions, and no edema.   Diet Order:  Diet Heart Room service appropriate? Yes; Fluid consistency: Thin  Skin:  Reviewed, no issues  Last BM:  8/27  Height:   Ht Readings from Last 1 Encounters:  04/08/17 5' 9"  (1.753 m)    Weight:   Wt Readings from Last 1 Encounters:  04/09/17 156 lb 8 oz (71 kg)    Ideal Body Weight:  72.7 kg  BMI:  Body mass index is 23.11  kg/m.  Estimated Nutritional Needs:   Kcal:  1900-2200kcal/day   Protein:  71-85g/day   Fluid:  >1.9L/day   EDUCATION NEEDS:   Education needs addressed  Koleen Distance MS, RD, LDN Pager #[phone] After Hours Pager: [phone]

## 2017-04-10 ENCOUNTER — Encounter: Payer: Self-pay | Admitting: Internal Medicine

## 2017-04-10 LAB — PROTEIN ELECTROPHORESIS, SERUM
A/G Ratio: 1.1 (ref 0.7–1.7)
ALBUMIN ELP: 3.1 g/dL (ref 2.9–4.4)
Alpha-1-Globulin: 0.2 g/dL (ref 0.0–0.4)
Alpha-2-Globulin: 0.8 g/dL (ref 0.4–1.0)
BETA GLOBULIN: 1 g/dL (ref 0.7–1.3)
GAMMA GLOBULIN: 0.9 g/dL (ref 0.4–1.8)
Globulin, Total: 2.9 g/dL (ref 2.2–3.9)
Total Protein ELP: 6 g/dL (ref 6.0–8.5)

## 2017-04-10 LAB — ENA+DNA/DS+ANTICH+CENTRO+JO...
Chromatin Ab SerPl-aCnc: <0.2 AI (ref 0.0–0.9)
DS DNA AB: 12 [IU]/mL — AB (ref 0–9)
ENA SM Ab Ser-aCnc: <0.2 AI (ref 0.0–0.9)
SSA (Ro) (ENA) Antibody, IgG: <0.2 AI (ref 0.0–0.9)
SSB (La) (ENA) Antibody, IgG: <0.2 AI (ref 0.0–0.9)
Scleroderma (Scl-70) (ENA) Antibody, IgG: <0.2 AI (ref 0.0–0.9)

## 2017-04-10 LAB — BASIC METABOLIC PANEL
ANION GAP: 6 (ref 5–15)
BUN: 39 mg/dL — ABNORMAL HIGH (ref 6–20)
CALCIUM: 8.2 mg/dL — AB (ref 8.9–10.3)
CO2: 20 mmol/L — AB (ref 22–32)
CREATININE: 1.88 mg/dL — AB (ref 0.61–1.24)
Chloride: 113 mmol/L — ABNORMAL HIGH (ref 101–111)
GFR calc Af Amer: 37 mL/min — ABNORMAL LOW (ref >60)
GFR, EST NON AFRICAN AMERICAN: 32 mL/min — AB (ref >60)
GLUCOSE: 162 mg/dL — AB (ref 65–99)
POTASSIUM: 3.2 mmol/L — AB (ref 3.5–5.1)
Sodium: 139 mmol/L (ref 135–145)

## 2017-04-10 LAB — ANA W/REFLEX IF POSITIVE: Anti Nuclear Antibody(ANA): POSITIVE — AB

## 2017-04-10 NOTE — Discharge Summary (Signed)
[place] [facility] [place] NAME: Andrew [name]    MR#:  [id number]  DATE OF BIRTH:  12-Dec-1932  DATE OF ADMISSION:  04/08/2017 ADMITTING PHYSICIAN: Dustin Flock, MD  DATE OF DISCHARGE: 04/10/2017  PRIMARY CARE PHYSICIAN: [name], MD    ADMISSION DIAGNOSIS:  Acute renal failure (ARF) ([place]) [N17.9] AKI (acute kidney injury) ([place]) [N17.9] Hypotension, unspecified hypotension type [I95.9]  DISCHARGE DIAGNOSIS:  Active Problems:   ARF (acute renal failure) ([facility])    Lung nodule  SECONDARY DIAGNOSIS:   Past Medical History:  Diagnosis Date  . Arteriosclerosis of coronary artery 01/27/2014  . Benign essential HTN 12/11/2013  . Carpal tunnel syndrome   . Coronary atherosclerosis of native coronary artery   . Disorder of mitral valve 12/11/2013  . Encounter for long-term (current) use of antiplatelets/antithrombotics   . Gout   . Hyperlipidemia   . Hypertension   . Mitral valve disorder   . Osteoarthritis   . Prostate cancer [facility])     HOSPITAL COURSE:   1. Acute renal failure on chronic kidney disease stage III - stop his diuretics including Lasix, HCTZ and Zaroxolyn -stop his NSAIDs -patient came in with creatinine of 3.45--- IV fluids----2.41- 1.8 -Baseline creatinine 1.6 -Checked renal ultrasound---No obstructions. -Nephrology consult---appreciated  2. Hypotension suspected due to dehydration and multiple diuretic therapy -blood pressure much improved. Hold BP meds for now. - Advised to follow with PMD in 1 week to check - if need to restart meds.  3. Coronary artery disease continue aspirin and Plavix  4. Hyperlipidemia unspecified continue Lipitor  5. Severe mitral regurg outpatient cardiology follow-up  6.2.6 lung nodule outpatient follow-up  7. Misc Lovenox for dvt proph  DISCHARGE CONDITIONS:   Stable.  CONSULTS OBTAINED:  Treatment Team:  Anthonette Legato, MD  DRUG ALLERGIES:  No Known  Allergies  DISCHARGE MEDICATIONS:   Current Discharge Medication List    CONTINUE these medications which have NOT CHANGED   Details  allopurinol (ZYLOPRIM) 100 MG tablet Take 400 mg by mouth daily.    aspirin 325 MG tablet Take 325 mg by mouth daily.     atorvastatin (LIPITOR) 40 MG tablet Take 40 mg by mouth daily. Refills: 6    clopidogrel (PLAVIX) 75 MG tablet Take 75 mg by mouth daily. Refills: 5    finasteride (PROSCAR) 5 MG tablet Take 5 mg by mouth daily. Refills: 11    meloxicam (MOBIC) 15 MG tablet Take 15 mg by mouth daily.      STOP taking these medications     amLODipine (NORVASC) 10 MG tablet      furosemide (LASIX) 20 MG tablet      hydrochlorothiazide (HYDRODIURIL) 25 MG tablet      metolazone (ZAROXOLYN) 5 MG tablet      metoprolol (LOPRESSOR) 50 MG tablet      naproxen sodium (ANAPROX) 220 MG tablet      HYDROcodone-acetaminophen (NORCO/VICODIN) 5-325 MG tablet      predniSONE (DELTASONE) 20 MG tablet          DISCHARGE INSTRUCTIONS:    Follow with PMD in 1 week and with nephrologist in 2 weeks.  If you experience worsening of your admission symptoms, develop shortness of breath, life threatening emergency, suicidal or homicidal thoughts you must seek medical attention immediately by calling 911 or calling your MD immediately  if symptoms less severe.  You Must read complete instructions/literature along with all the possible adverse reactions/side effects for all  the Medicines you take and that have been prescribed to you. Take any new Medicines after you have completely understood and accept all the possible adverse reactions/side effects.   Please note  You were cared for by a hospitalist during your hospital stay. If you have any questions about your discharge medications or the care you received while you were in the hospital after you are discharged, you can call the unit and asked to speak with the hospitalist on call if the  hospitalist that took care of you is not available. Once you are discharged, your primary care physician will handle any further medical issues. Please note that NO REFILLS for any discharge medications will be authorized once you are discharged, as it is imperative that you return to your primary care physician (or establish a relationship with a primary care physician if you do not have one) for your aftercare needs so that they can reassess your need for medications and monitor your lab values.    Today   CHIEF COMPLAINT:   Chief Complaint  Patient presents with  . Hypotension    HISTORY OF PRESENT ILLNESS:  Andrew [name]  is a 83 y.o. male with a known history of Severe mitral regurg, essential hypertension, coronary artery disease, gout, hyperlipidemia, hypertension and prostate cancer who is presenting to the hospital with complaint of dizziness. Patient states that his been feeling like this for the past few days. He has not passed out. He was seen at the urgent clinic and was noted to have blood pressure initially in the 60s. Now blood pressures normalize after receiving IV fluid. Patient also noticed to have acute on chronic renal failure. He does report that he works outside in the sun.   VITAL SIGNS:  Blood pressure 133/79, pulse (!) 106, temperature 98.7 F (37.1 C), temperature source Oral, resp. rate 18, height 5\' 9"  (1.753 m), weight 71 kg (156 lb 8 oz), SpO2 98 %.  I/O:   Intake/Output Summary (Last 24 hours) at 04/10/17 1113 Last data filed at 04/10/17 0900  Gross per 24 hour  Intake             1986 ml  Output             1525 ml  Net              461 ml    PHYSICAL EXAMINATION:  GENERAL:  83 y.o.-year-old patient lying in the bed with no acute distress.  EYES: Pupils equal, round, reactive to light and accommodation. No scleral icterus. Extraocular muscles intact.  HEENT: Head atraumatic, normocephalic. Oropharynx and nasopharynx clear.  NECK:  Supple, no jugular  venous distention. No thyroid enlargement, no tenderness.  LUNGS: Normal breath sounds bilaterally, no wheezing, rales,rhonchi or crepitation. No use of accessory muscles of respiration.  CARDIOVASCULAR: S1, S2 normal. No murmurs, rubs, or gallops.  ABDOMEN: Soft, non-tender, non-distended. Bowel sounds present. No organomegaly or mass.  EXTREMITIES: No pedal edema, cyanosis, or clubbing.  NEUROLOGIC: Cranial nerves II through XII are intact. Muscle strength 5/5 in all extremities. Sensation intact. Gait not checked.  PSYCHIATRIC: The patient is alert and oriented x 3.  SKIN: No obvious rash, lesion, or ulcer.   DATA REVIEW:   CBC  Recent Labs Lab 04/09/17 0421  WBC 8.6  HGB 10.4*  HCT 30.6*  PLT 382    Chemistries   Recent Labs Lab 04/08/17 1124  04/10/17 0921  NA 138  < > 139  K 4.1  < >  3.2*  CL 103  < > 113*  CO2 23  < > 20*  GLUCOSE 132*  < > 162*  BUN 66*  < > 39*  CREATININE 3.45*  < > 1.88*  CALCIUM 9.2  < > 8.2*  AST 23  --   --   ALT 10*  --   --   ALKPHOS 101  --   --   BILITOT 0.8  --   --   < > = values in this interval not displayed.  Cardiac Enzymes  Recent Labs Lab 04/08/17 1124  TROPONINI <0.03    Microbiology Results  No results found for this or any previous visit.  RADIOLOGY:  US Renal  Result Date: 04/09/2017 CLINICAL DATA:  Acute renal failure EXAM: RENAL / URINARY TRACT ULTRASOUND COMPLETE COMPARISON:  None. FINDINGS: Right Kidney: Length: 10.8 cm. 7 cm cyst is noted in the mid to lower pole of the right kidney. No obstructive changes are seen. No calculi are seen. Left Kidney: Length: 11.8 cm. Cystic areas noted in the midportion of the left kidney measuring 1.6 cm. This may represent a mildly septated single cyst or 2 adjacent small cysts. Bladder: Bladder is well distended.  Prostate is enlarged. IMPRESSION: Cystic changes in the kidneys bilaterally. No acute abnormality is noted. Electronically Signed   By: Inez Catalina M.D.   On:  04/09/2017 09:03   Dg Chest Port 1 View  Result Date: 04/08/2017 CLINICAL DATA:  Pt sent from [facility] with c/o feeling dizzy with blurred vision for the past 3 days. Pt is hypotensive on arrival. EXAM: PORTABLE CHEST - 1 VIEW NONE AVAILABLE: NONE AVAILABLE FINDINGS: 2.6 cm nodular density at the right lung base.  Left lung clear. Heart size and mediastinal contours are within normal limits. No effusion. Visualized bones unremarkable. IMPRESSION: 1. 2.6 cm right lower lung nodule. Recommend CT chest for further characterization Electronically Signed   By: Lucrezia Europe M.D.   On: 04/08/2017 13:11    EKG:   Orders placed or performed during the hospital encounter of 04/08/17  . EKG 12-Lead  . EKG 12-Lead  . EKG 12-Lead  . EKG 12-Lead      Management plans discussed with the patient, family and they are in agreement.  CODE STATUS:     Code Status Orders        Start     Ordered   04/08/17 1556  Full code  Continuous     04/08/17 1555    Code Status History    Date Active Date Inactive Code Status Order ID Comments User Context   This patient has a current code status but no historical code status.      TOTAL TIME TAKING CARE OF THIS PATIENT: 35 minutes.    Vaughan Basta M.D on 04/10/2017 at 11:13 AM  Between 7am to 6pm - Pager - [phone]  After 6pm go to [email or web address] - password [email or web address]  Office  [phone]  CC: Primary care physician; [name], MD   Note: This dictation was prepared with Dragon dictation along with smaller phrase technology. Any transcriptional errors that result from this process are unintentional.

## 2017-04-10 NOTE — Discharge Instructions (Signed)
Acute Kidney Injury, Adult Acute kidney injury is a sudden worsening of kidney function. The kidneys are organs that have several jobs. They filter the blood to remove waste products and extra fluid. They also maintain a healthy balance of minerals and hormones in the body, which helps control blood pressure and keep bones strong. With this condition, your kidneys do not do their jobs as well as they should. This condition ranges from mild to severe. Over time it may develop into long-lasting (chronic) kidney disease. Early detection and treatment may prevent acute kidney injury from developing into a chronic condition. What are the causes? Common causes of this condition include:  A problem with blood flow to the kidneys. This may be caused by:  Low blood pressure (hypotension) or shock.  Blood loss.  Heart and blood vessel (cardiovascular) disease.  Severe burns.  Liver disease.  Direct damage to the kidneys. This may be caused by:  Certain medicines.  A kidney infection.  Poisoning.  Being around or in contact with toxic substances.  A surgical wound.  A hard, direct hit to the kidney area.  A sudden blockage of urine flow. This may be caused by:  Cancer.  Kidney stones.  An enlarged prostate in males. What are the signs or symptoms? Symptoms of this condition may not be obvious until the condition becomes severe. Symptoms of this condition can include:  Tiredness (lethargy), or difficulty staying awake.  Nausea or vomiting.  Swelling (edema) of the face, legs, ankles, or feet.  Problems with urination, such as:  Abdominal pain, or pain along the side of your stomach (flank).  Decreased urine production.  Decrease in the force of urine flow.  Muscle twitches and cramps, especially in the legs.  Confusion or trouble concentrating.  Loss of appetite.  Fever. How is this diagnosed? This condition may be diagnosed with tests, including:  Blood  tests.  Urine tests.  Imaging tests.  A test in which a sample of tissue is removed from the kidneys to be examined under a microscope (kidney biopsy). How is this treated? Treatment for this condition depends on the cause and how severe the condition is. In mild cases, treatment may not be needed. The kidneys may heal on their own. In more severe cases, treatment will involve:  Treating the cause of the kidney injury. This may involve changing any medicines you are taking or adjusting your dosage.  Fluids. You may need specialized IV fluids to balance your body's needs.  Having a catheter placed to drain urine and prevent blockages.  Preventing problems from occurring. This may mean avoiding certain medicines or procedures that can cause further injury to the kidneys. In some cases treatment may also require:  A procedure to remove toxic wastes from the body (dialysis or continuous renal replacement therapy - CRRT).  Surgery. This may be done to repair a torn kidney, or to remove the blockage from the urinary system. Follow these instructions at home: Medicines   Take over-the-counter and prescription medicines only as told by your health care provider.  Do not take any new medicines without your health care provider's approval. Many medicines can worsen your kidney damage.  Do not take any vitamin and mineral supplements without your health care provider's approval. Many nutritional supplements can worsen your kidney damage. Lifestyle   If your health care provider prescribed changes to your diet, follow them. You may need to decrease the amount of protein you eat.  Achieve and maintain a   healthy weight. If you need help with this, ask your health care provider.  Start or continue an exercise plan. Try to exercise at least 30 minutes a day, 5 days a week.  Do not use any tobacco products, such as cigarettes, chewing tobacco, and e-cigarettes. If you need help quitting, ask  your health care provider. General instructions   Keep track of your blood pressure. Report changes in your blood pressure as told by your health care provider.  Stay up to date with immunizations. Ask your health care provider which immunizations you need.  Keep all follow-up visits as told by your health care provider. This is important. Where to find more information:  American Association of Kidney Patients: www.aakp.org  National Kidney Foundation: www.kidney.org  American Kidney Fund: www.akfinc.org  Life Options Rehabilitation Program:  www.lifeoptions.org  www.kidneyschool.org Contact a health care provider if:  Your symptoms get worse.  You develop new symptoms. Get help right away if:  You develop symptoms of worsening kidney disease, which include:  Headaches.  Abnormally dark or light skin.  Easy bruising.  Frequent hiccups.  Chest pain.  Shortness of breath.  End of menstruation in women.  Seizures.  Confusion or altered mental status.  Abdominal or back pain.  Itchiness.  You have a fever.  Your body is producing less urine.  You have pain or bleeding when you urinate. Summary  Acute kidney injury is a sudden worsening of kidney function.  Acute kidney injury can be caused by problems with blood flow to the kidneys, direct damage to the kidneys, and sudden blockage of urine flow.  Symptoms of this condition may not be obvious until it becomes severe. Symptoms may include edema, lethargy, confusion, nausea or vomiting, and problems passing urine.  This condition can usually be diagnosed with blood tests, urine tests, and imaging tests. Sometimes a kidney biopsy is done to diagnose this condition.  Treatment for this condition often involves treating the underlying cause. It is treated with fluids, medicines, dialysis, diet changes, or surgery. This information is not intended to replace advice given to you by your health care provider.  Make sure you discuss any questions you have with your health care provider. Document Released: 02/12/2011 Document Revised: 07/20/2016 Document Reviewed: 07/20/2016 Elsevier Interactive Patient Education  2017 Elsevier Inc.  

## 2017-04-10 NOTE — Progress Notes (Signed)
[facility]  ROUNDING NOTE   Subjective:  Renal function continues to improve. BUN down to 39 with a creatinine 1.88. Next line patient in good spirits today. Good urine output of 2.2 L over the preceding 24 hours.  Objective:  Vital signs in last 24 hours:  Temp:  [98 F (36.7 C)-98.8 F (37.1 C)] 98.7 F (37.1 C) (08/29 0519) Pulse Rate:  [78-106] 106 (08/29 0519) Resp:  [18-20] 18 (08/29 0519) BP: (116-133)/(53-79) 133/79 (08/29 0519) SpO2:  [98 %-100 %] 98 % (08/29 0519) Weight:  [71 kg (156 lb 8 oz)] 71 kg (156 lb 8 oz) (08/28 1117)  Weight change: 0.227 kg (8 oz) Filed Weights   04/08/17 1110 04/09/17 1117  Weight: 70.8 kg (156 lb) 71 kg (156 lb 8 oz)    Intake/Output: I/O last 3 completed shifts: In: 3173 [P.O.:720; I.V.:2453] Out: 2200 [Urine:2200]   Intake/Output this shift:  Total I/O In: 186 [I.V.:186] Out: -   Physical Exam: General: No acute distress  Head: Normocephalic, atraumatic. Moist oral mucosal membranes  Eyes: Anicteric  Neck: Supple, trachea midline  Lungs:  Clear to auscultation, normal effort  Heart: S1S2 no rubs  Abdomen:  Soft, nontender, bowel sounds present  Extremities: No peripheral edema.  Neurologic: Awake, alert, following commands  Skin: No lesions       Basic Metabolic Panel:  Recent Labs Lab 04/08/17 1124 04/09/17 0421 04/10/17 0921  NA 138 139 139  K 4.1 3.5 3.2*  CL 103 112* 113*  CO2 23 21* 20*  GLUCOSE 132* 91 162*  BUN 66* 54* 39*  CREATININE 3.45* 2.41* 1.88*  CALCIUM 9.2 8.3* 8.2*    Liver Function Tests:  Recent Labs Lab 04/08/17 1124  AST 23  ALT 10*  ALKPHOS 101  BILITOT 0.8  PROT 7.1  ALBUMIN 3.7   No results for input(s): LIPASE, AMYLASE in the last 168 hours. No results for input(s): AMMONIA in the last 168 hours.  CBC:  Recent Labs Lab 04/08/17 1124 04/09/17 0421  WBC 9.6 8.6  NEUTROABS 6.3  --   HGB 11.9* 10.4*  HCT 35.7* 30.6*  MCV 88.5 87.5  PLT 442* 382     Cardiac Enzymes:  Recent Labs Lab 04/08/17 1124  TROPONINI <0.03    BNP: Invalid input(s): POCBNP  CBG: No results for input(s): GLUCAP in the last 168 hours.  Microbiology: No results found for this or any previous visit.  Coagulation Studies: No results for input(s): LABPROT, INR in the last 72 hours.  Urinalysis:  Recent Labs  04/08/17 1320  COLORURINE STRAW*  LABSPEC 1.006  PHURINE 5.0  GLUCOSEU NEGATIVE  HGBUR NEGATIVE  BILIRUBINUR NEGATIVE  KETONESUR NEGATIVE  PROTEINUR NEGATIVE  NITRITE NEGATIVE  LEUKOCYTESUR NEGATIVE      Imaging: US Renal  Result Date: 04/09/2017 CLINICAL DATA:  Acute renal failure EXAM: RENAL / URINARY TRACT ULTRASOUND COMPLETE COMPARISON:  None. FINDINGS: Right Kidney: Length: 10.8 cm. 7 cm cyst is noted in the mid to lower pole of the right kidney. No obstructive changes are seen. No calculi are seen. Left Kidney: Length: 11.8 cm. Cystic areas noted in the midportion of the left kidney measuring 1.6 cm. This may represent a mildly septated single cyst or 2 adjacent small cysts. Bladder: Bladder is well distended.  Prostate is enlarged. IMPRESSION: Cystic changes in the kidneys bilaterally. No acute abnormality is noted. Electronically Signed   By: Inez Catalina M.D.   On: 04/09/2017 09:03   Dg Chest Port 1  View  Result Date: 04/08/2017 CLINICAL DATA:  Pt sent from [facility] with c/o feeling dizzy with blurred vision for the past 3 days. Pt is hypotensive on arrival. EXAM: PORTABLE CHEST - 1 VIEW NONE AVAILABLE: NONE AVAILABLE FINDINGS: 2.6 cm nodular density at the right lung base.  Left lung clear. Heart size and mediastinal contours are within normal limits. No effusion. Visualized bones unremarkable. IMPRESSION: 1. 2.6 cm right lower lung nodule. Recommend CT chest for further characterization Electronically Signed   By: Lucrezia Europe M.D.   On: 04/08/2017 13:11     Medications:   . sodium chloride 100 mL/hr at 04/10/17 0523  .  allopurinol (ZYLOPRIM) tablet 400 mg     . aspirin  325 mg Oral Daily  . atorvastatin  40 mg Oral Daily  . clopidogrel  75 mg Oral Daily  . finasteride  5 mg Oral Daily  . heparin  5,000 Units Subcutaneous Q8H  . multivitamin with minerals  1 tablet Oral Daily  . protein supplement shake  11 oz Oral Q24H   acetaminophen **OR** acetaminophen, HYDROcodone-acetaminophen, ondansetron **OR** ondansetron (ZOFRAN) IV  Assessment/ Plan:  83 y.o. male with a PMHx of Hypertension, carpal tunnel syndrome, mitral regurgitation, gout, hyperlipidemia, hypertension, osteoarthritis, prostate cancer, who was admitted to [facility] on 04/08/2017 for evaluation of dizziness.   1. Acute renal failure/chronic kidney disease stage III. The patient's baseline creatinine is 1.6. Suspect acute renal failure is now related to poor by mouth intake as well as multiple diuretics including Lasix, metolazone, and HCTZ.  NSAIDs including meloxicam as well as naproxen also likely contributing.  -  Renal function has significantly improved since admission. BUN down to 39 with a creatinine of 1.8. Recommend keeping the patient off of diuretics at this point in time. Patient was also advised to avoid NSAIDs.  2. Hypotension. Now resolved. Blood pressure currently 133/79. Okay to discontinue IV fluids.  3. Anemia of chronic kidney disease.  We will need to continue to monitor him as an outpatient. No indication for Epogen at the moment.  4. We plan to see the patient back in the office in one to 2 weeks or sooner for any acute issues.   LOS: 2 Viraat Vanpatten 8/29/201810:31 AM

## 2017-04-10 NOTE — Progress Notes (Signed)
Discharge instructions reviewed with patient including medication changes and followup appointments.  Understanding was verbalized and all questions were answered.  Patient discharged home in stable condition escorted by volunteer staff.

## 2017-04-11 LAB — PROTEIN ELECTRO, RANDOM URINE
ALBUMIN ELP UR: 100 %
ALPHA-1-GLOBULIN, U: 0 %
ALPHA-2-GLOBULIN, U: 0 %
BETA GLOBULIN, U: 0 %
GAMMA GLOBULIN, U: 0 %

## 2017-04-24 ENCOUNTER — Other Ambulatory Visit: Payer: Self-pay | Admitting: Family Medicine

## 2017-04-25 ENCOUNTER — Other Ambulatory Visit: Payer: Self-pay | Admitting: Family Medicine

## 2017-04-25 DIAGNOSIS — R911 Solitary pulmonary nodule: Secondary | ICD-10-CM

## 2017-05-01 ENCOUNTER — Ambulatory Visit
Admission: RE | Admit: 2017-05-01 | Discharge: 2017-05-01 | Disposition: A | Discharge status: Home / Self Care | Payer: Medicare Other | Source: Ambulatory Visit | Attending: Family Medicine | Admitting: Family Medicine

## 2017-05-01 DIAGNOSIS — I700 Atherosclerosis of aorta: Secondary | ICD-10-CM | POA: Diagnosis not present

## 2017-05-01 DIAGNOSIS — N281 Cyst of kidney, acquired: Secondary | ICD-10-CM | POA: Insufficient documentation

## 2017-05-01 DIAGNOSIS — N289 Disorder of kidney and ureter, unspecified: Secondary | ICD-10-CM | POA: Diagnosis not present

## 2017-05-01 DIAGNOSIS — R911 Solitary pulmonary nodule: Secondary | ICD-10-CM | POA: Insufficient documentation

## 2017-05-01 DIAGNOSIS — I2510 Atherosclerotic heart disease of native coronary artery without angina pectoris: Secondary | ICD-10-CM | POA: Diagnosis not present

## 2017-05-13 ENCOUNTER — Other Ambulatory Visit: Payer: Self-pay | Admitting: Family Medicine

## 2017-05-13 DIAGNOSIS — N281 Cyst of kidney, acquired: Secondary | ICD-10-CM

## 2017-05-28 ENCOUNTER — Ambulatory Visit
Admission: RE | Admit: 2017-05-28 | Discharge: 2017-05-28 | Disposition: A | Discharge status: Home / Self Care | Payer: Medicare Other | Source: Ambulatory Visit | Attending: Family Medicine | Admitting: Family Medicine

## 2017-05-28 DIAGNOSIS — Q859 Phakomatosis, unspecified: Secondary | ICD-10-CM | POA: Insufficient documentation

## 2017-05-28 DIAGNOSIS — J90 Pleural effusion, not elsewhere classified: Secondary | ICD-10-CM | POA: Insufficient documentation

## 2017-05-28 DIAGNOSIS — R911 Solitary pulmonary nodule: Secondary | ICD-10-CM | POA: Diagnosis not present

## 2017-05-28 DIAGNOSIS — I700 Atherosclerosis of aorta: Secondary | ICD-10-CM | POA: Diagnosis not present

## 2017-05-28 DIAGNOSIS — N281 Cyst of kidney, acquired: Secondary | ICD-10-CM | POA: Diagnosis present

## 2017-05-28 MED ORDER — GADOBENATE DIMEGLUMINE 529 MG/ML IV SOLN
15.0000 mL | Freq: Once | INTRAVENOUS | Status: AC | PRN
  Administered 2017-05-28: 15 mL via INTRAVENOUS

## 2017-07-25 ENCOUNTER — Emergency Department
Admission: EM | Admit: 2017-07-25 | Discharge: 2017-07-25 | Disposition: A | Discharge status: Home / Self Care | Payer: Medicare Other | Source: Home / Self Care | Attending: Emergency Medicine | Admitting: Emergency Medicine

## 2017-07-25 ENCOUNTER — Encounter: Payer: Self-pay | Admitting: Emergency Medicine

## 2017-07-25 DIAGNOSIS — I10 Essential (primary) hypertension: Secondary | ICD-10-CM | POA: Insufficient documentation

## 2017-07-25 DIAGNOSIS — Z8546 Personal history of malignant neoplasm of prostate: Secondary | ICD-10-CM

## 2017-07-25 DIAGNOSIS — D649 Anemia, unspecified: Secondary | ICD-10-CM

## 2017-07-25 DIAGNOSIS — H538 Other visual disturbances: Secondary | ICD-10-CM

## 2017-07-25 DIAGNOSIS — Z7901 Long term (current) use of anticoagulants: Secondary | ICD-10-CM

## 2017-07-25 DIAGNOSIS — R42 Dizziness and giddiness: Secondary | ICD-10-CM

## 2017-07-25 DIAGNOSIS — K922 Gastrointestinal hemorrhage, unspecified: Secondary | ICD-10-CM | POA: Diagnosis not present

## 2017-07-25 DIAGNOSIS — Z79899 Other long term (current) drug therapy: Secondary | ICD-10-CM

## 2017-07-25 DIAGNOSIS — N179 Acute kidney failure, unspecified: Secondary | ICD-10-CM

## 2017-07-25 DIAGNOSIS — I2510 Atherosclerotic heart disease of native coronary artery without angina pectoris: Secondary | ICD-10-CM | POA: Insufficient documentation

## 2017-07-25 DIAGNOSIS — K254 Chronic or unspecified gastric ulcer with hemorrhage: Secondary | ICD-10-CM | POA: Diagnosis not present

## 2017-07-25 LAB — CBC
HCT: 26.9 % — ABNORMAL LOW (ref 40.0–52.0)
HEMOGLOBIN: 8.7 g/dL — AB (ref 13.0–18.0)
MCH: 24.9 pg — AB (ref 26.0–34.0)
MCHC: 32.2 g/dL (ref 32.0–36.0)
MCV: 77.1 fL — ABNORMAL LOW (ref 80.0–100.0)
Platelets: 406 10*3/uL (ref 150–440)
RBC: 3.49 MIL/uL — AB (ref 4.40–5.90)
RDW: 20.2 % — ABNORMAL HIGH (ref 11.5–14.5)
WBC: 9.5 10*3/uL (ref 3.8–10.6)

## 2017-07-25 LAB — BASIC METABOLIC PANEL
ANION GAP: 11 (ref 5–15)
BUN: 60 mg/dL — ABNORMAL HIGH (ref 6–20)
CALCIUM: 9.2 mg/dL (ref 8.9–10.3)
CO2: 21 mmol/L — AB (ref 22–32)
Chloride: 106 mmol/L (ref 101–111)
Creatinine, Ser: 2.71 mg/dL — ABNORMAL HIGH (ref 0.61–1.24)
GFR calc non Af Amer: 20 mL/min — ABNORMAL LOW (ref >60)
GFR, EST AFRICAN AMERICAN: 24 mL/min — AB (ref >60)
Glucose, Bld: 98 mg/dL (ref 65–99)
Potassium: 3.9 mmol/L (ref 3.5–5.1)
Sodium: 138 mmol/L (ref 135–145)

## 2017-07-25 MED ORDER — SODIUM CHLORIDE 0.9 % IV BOLUS (SEPSIS)
1000.0000 mL | Freq: Once | INTRAVENOUS | Status: AC
  Administered 2017-07-25: 1000 mL via INTRAVENOUS

## 2017-07-25 NOTE — ED Notes (Signed)

## 2017-07-25 NOTE — Discharge Instructions (Signed)
As we discussed it is very important that you get your blood work checked again tomorrow to evaluate for kidney function and blood level (anemia). Please seek medical attention for any high fevers, chest pain, shortness of breath, change in behavior, persistent vomiting, bloody stool or any other new or concerning symptoms.

## 2017-07-25 NOTE — ED Notes (Addendum)
Pt voicing concerns of urgency to leave, pt informed of importance as individual patient and tremendous apologies for delay due to emergency in another room. Pt consent to IV and 1L NS bolus at this time

## 2017-07-25 NOTE — ED Triage Notes (Signed)
Pt presents to ED via POV, pt was brought over from [facility] with c/o blurred vision and dizziness with standing x 2 weeks. Pt is alert and oriented, neurologically intact, grip strengths equal bilaterally, denies any numbness or tingling. Pt states blurred vision accompanies the dizziness and the dizziness is noted when he goes from a sitting position to a standing position.

## 2017-07-25 NOTE — ED Provider Notes (Signed)
[facility] Emergency Department Provider Note  ____________________________________________   I have reviewed the triage vital signs and the nursing notes.   HISTORY  Chief Complaint Dizziness and Blurred Vision   History limited by: Not Limited   HPI Andrew [name] is a 83 y.o. male who presents to the emergency department today because of dizziness.  DURATION:3-4 days TIMING: intermittent QUALITY: light headed CONTEXT: patient states that he has been feeling dizzy when he stands up. Thought it would get better but went to [facility] clinic this morning when it persisted.  MODIFYING FACTORS: worse when standing ASSOCIATED SYMPTOMS: denies any chest pain. Has not noticed any bloody, black or tarry stool.  Per medical record review patient has a history of AKI and admission for similar symptoms.   Past Medical History:  Diagnosis Date  . Arteriosclerosis of coronary artery 01/27/2014  . Benign essential HTN 12/11/2013  . Carpal tunnel syndrome   . Coronary atherosclerosis of native coronary artery   . Disorder of mitral valve 12/11/2013  . Encounter for long-term (current) use of antiplatelets/antithrombotics   . Gout   . Hyperlipidemia   . Hypertension   . Lung nodule    found on xray in Aug 2018  . Mitral valve disorder   . Osteoarthritis   . Prostate cancer [facility])     Patient Active Problem List   Diagnosis Date Noted  . ARF (acute renal failure) ([place]) 04/08/2017  . PAD (peripheral artery disease) ([place]) 06/04/2016  . Colon polyp 06/29/2015  . Malignant neoplasm of prostate ([place]) 11/14/2014  . Combined fat and carbohydrate induced hyperlipemia 07/29/2014  . Long term current use of antithrombotics/antiplatelets 01/27/2014  . Arteriosclerosis of coronary artery 01/27/2014  . Carpal tunnel syndrome 12/11/2013  . Benign essential HTN 12/11/2013  . Lesion of ulnar nerve 12/11/2013  . Disorder of mitral valve 12/11/2013  . Arthritis, degenerative  12/11/2013  . Hereditary and idiopathic neuropathy 12/11/2013    Past Surgical History:  Procedure Laterality Date  . COLONOSCOPY    . COLONOSCOPY WITH PROPOFOL N/A 07/23/2016   Procedure: COLONOSCOPY WITH PROPOFOL;  Surgeon: Manya Silvas, MD;  Location: [facility] ENDOSCOPY;  Service: Endoscopy;  Laterality: N/A;  . CORONARY ANGIOPLASTY WITH STENT PLACEMENT    . ESOPHAGOGASTRODUODENOSCOPY (EGD) WITH PROPOFOL N/A 07/23/2016   Procedure: ESOPHAGOGASTRODUODENOSCOPY (EGD) WITH PROPOFOL;  Surgeon: Manya Silvas, MD;  Location: [facility] ENDOSCOPY;  Service: Endoscopy;  Laterality: N/A;  . PROSTATE BIOPSY      Prior to Admission medications   Medication Sig Start Date End Date Taking? Authorizing Provider  allopurinol (ZYLOPRIM) 100 MG tablet Take 400 mg by mouth daily. 02/20/17   [provider]  aspirin 325 MG tablet Take 325 mg by mouth daily.     [provider]  atorvastatin (LIPITOR) 40 MG tablet Take 40 mg by mouth daily. 04/24/15   [provider]  clopidogrel (PLAVIX) 75 MG tablet Take 75 mg by mouth daily. 04/24/15   [provider]  finasteride (PROSCAR) 5 MG tablet Take 5 mg by mouth daily. 04/13/15   [provider]  meloxicam (MOBIC) 15 MG tablet Take 15 mg by mouth daily.    [provider]    Allergies Patient has no known allergies.  Family History  Problem Relation Age of Onset  . Cancer Mother        stomach and uterus  . Cancer Sister        breast    Social History Social History  Tobacco Use  . Smoking status: Never Smoker  . Smokeless tobacco: Never Used  Substance Use Topics  . Alcohol use: No  . Drug use: No    Review of Systems Constitutional: No fever/chills. Positive for dizziness. Eyes: No visual changes. ENT: No sore throat. Cardiovascular: Denies chest pain. Respiratory: Denies shortness of breath. Gastrointestinal: No abdominal pain.  No nausea, no vomiting.  No diarrhea.   Genitourinary:  Negative for dysuria. Musculoskeletal: Negative for back pain. Skin: Negative for rash. Neurological: Negative for headaches, focal weakness or numbness.  ____________________________________________   PHYSICAL EXAM:  VITAL SIGNS: ED Triage Vitals [07/25/17 1133]  Enc Vitals Group     BP (!) 142/60     Pulse Rate 87     Resp 18     Temp 97.8 F (36.6 C)     Temp Source Oral     SpO2 100 %     Weight 153 lb (69.4 kg)     Height 5\' 9"  (1.753 m)   Constitutional: Alert and oriented. Well appearing and in no distress. Eyes: Conjunctivae are normal.  ENT   Head: Normocephalic and atraumatic.   Nose: No congestion/rhinnorhea.   Mouth/Throat: Mucous membranes are moist.   Neck: No stridor. Hematological/Lymphatic/Immunilogical: No cervical lymphadenopathy. Cardiovascular: Normal rate, regular rhythm.  Systolic murmur.  Respiratory: Normal respiratory effort without tachypnea nor retractions. Breath sounds are clear and equal bilaterally. No wheezes/rales/rhonchi. Gastrointestinal: Soft and non tender. No rebound. No guarding.  Genitourinary: Deferred Musculoskeletal: Normal range of motion in all extremities. No lower extremity edema. Neurologic:  Normal speech and language. No gross focal neurologic deficits are appreciated.  Skin:  Skin is warm, dry and intact. No rash noted. Psychiatric: Mood and affect are normal. Speech and behavior are normal. Patient exhibits appropriate insight and judgment.  ____________________________________________    LABS (pertinent positives/negatives)  CBC wbc 9.5, hgb 8.7, plt 406 BMP cr 2.71, bun 60  ____________________________________________   EKG  I, Nance Pear, attending physician, personally viewed and interpreted this EKG  EKG Time: 1139 Rate: 80 Rhythm: normal sinus rhythm Axis: normal Intervals: qtc 433 QRS: narrow, lvh, q waves V1 ST changes: no st elevation Impression: abnormal  ekg   ____________________________________________    RADIOLOGY  None  ____________________________________________   PROCEDURES  Procedures  ____________________________________________   INITIAL IMPRESSION / ASSESSMENT AND PLAN / ED COURSE  Pertinent labs & imaging results that were available during my care of the patient were reviewed by me and considered in my medical decision making (see chart for details).  Patient presented to the emergency department today because of concern for dizziness. Ddx is broad and includes infection, anemia, vertigo, electrolyte abnormality, dehydration amongst other etiologies. Blood work does show elevated creatinine as well as anemia. Patient does have history of AKI in the past. I did discuss the blood work findings with the patient. Discussed my concern that if the kidney function continues to get worse that people can get kidney failure and do die from that. Additionally discussed my concern about the anemia. Patient however was insistent that he had to leave the emergency department tonight. He was willing to stay for one liter of IVF. Discussed with patient that he needs to get his blood work rechecked tomorrow, which the patient stated he was willing to do.    ____________________________________________   FINAL CLINICAL IMPRESSION(S) / ED DIAGNOSES  Final diagnoses:  Dizziness  AKI (acute kidney injury) ([place])  Anemia, unspecified type     Note: This  dictation was prepared with Dragon dictation. Any transcriptional errors that result from this process are unintentional     Nance Pear, MD 07/25/17 1800

## 2017-07-25 NOTE — ED Triage Notes (Signed)
Brought over from [facility] with dizziness and blurred vision  Also having some exertional SOB

## 2017-07-26 ENCOUNTER — Inpatient Hospital Stay
Admission: EM | Admit: 2017-07-26 | Discharge: 2017-07-28 | DRG: 378 | Disposition: A | Discharge status: Home / Self Care | Payer: Medicare Other | Attending: Internal Medicine | Admitting: Internal Medicine

## 2017-07-26 ENCOUNTER — Emergency Department: Payer: Medicare Other

## 2017-07-26 ENCOUNTER — Other Ambulatory Visit: Payer: Self-pay

## 2017-07-26 DIAGNOSIS — D62 Acute posthemorrhagic anemia: Secondary | ICD-10-CM | POA: Diagnosis present

## 2017-07-26 DIAGNOSIS — K253 Acute gastric ulcer without hemorrhage or perforation: Secondary | ICD-10-CM

## 2017-07-26 DIAGNOSIS — Z803 Family history of malignant neoplasm of breast: Secondary | ICD-10-CM | POA: Diagnosis not present

## 2017-07-26 DIAGNOSIS — I059 Rheumatic mitral valve disease, unspecified: Secondary | ICD-10-CM | POA: Diagnosis present

## 2017-07-26 DIAGNOSIS — K254 Chronic or unspecified gastric ulcer with hemorrhage: Secondary | ICD-10-CM | POA: Diagnosis present

## 2017-07-26 DIAGNOSIS — K922 Gastrointestinal hemorrhage, unspecified: Secondary | ICD-10-CM | POA: Diagnosis present

## 2017-07-26 DIAGNOSIS — D132 Benign neoplasm of duodenum: Secondary | ICD-10-CM | POA: Diagnosis not present

## 2017-07-26 DIAGNOSIS — Z8049 Family history of malignant neoplasm of other genital organs: Secondary | ICD-10-CM | POA: Diagnosis not present

## 2017-07-26 DIAGNOSIS — I739 Peripheral vascular disease, unspecified: Secondary | ICD-10-CM | POA: Diagnosis present

## 2017-07-26 DIAGNOSIS — E785 Hyperlipidemia, unspecified: Secondary | ICD-10-CM | POA: Diagnosis present

## 2017-07-26 DIAGNOSIS — I129 Hypertensive chronic kidney disease with stage 1 through stage 4 chronic kidney disease, or unspecified chronic kidney disease: Secondary | ICD-10-CM | POA: Diagnosis present

## 2017-07-26 DIAGNOSIS — I2510 Atherosclerotic heart disease of native coronary artery without angina pectoris: Secondary | ICD-10-CM | POA: Diagnosis present

## 2017-07-26 DIAGNOSIS — N400 Enlarged prostate without lower urinary tract symptoms: Secondary | ICD-10-CM | POA: Diagnosis present

## 2017-07-26 DIAGNOSIS — Z7902 Long term (current) use of antithrombotics/antiplatelets: Secondary | ICD-10-CM | POA: Diagnosis not present

## 2017-07-26 DIAGNOSIS — N179 Acute kidney failure, unspecified: Secondary | ICD-10-CM | POA: Diagnosis present

## 2017-07-26 DIAGNOSIS — D631 Anemia in chronic kidney disease: Secondary | ICD-10-CM | POA: Diagnosis present

## 2017-07-26 DIAGNOSIS — N189 Chronic kidney disease, unspecified: Secondary | ICD-10-CM | POA: Diagnosis present

## 2017-07-26 DIAGNOSIS — Z7982 Long term (current) use of aspirin: Secondary | ICD-10-CM | POA: Diagnosis not present

## 2017-07-26 DIAGNOSIS — Z791 Long term (current) use of non-steroidal anti-inflammatories (NSAID): Secondary | ICD-10-CM

## 2017-07-26 DIAGNOSIS — I340 Nonrheumatic mitral (valve) insufficiency: Secondary | ICD-10-CM | POA: Diagnosis present

## 2017-07-26 DIAGNOSIS — Z8546 Personal history of malignant neoplasm of prostate: Secondary | ICD-10-CM

## 2017-07-26 DIAGNOSIS — Z800 Family history of malignant neoplasm of digestive organs: Secondary | ICD-10-CM | POA: Diagnosis not present

## 2017-07-26 DIAGNOSIS — Z955 Presence of coronary angioplasty implant and graft: Secondary | ICD-10-CM | POA: Diagnosis not present

## 2017-07-26 DIAGNOSIS — K219 Gastro-esophageal reflux disease without esophagitis: Secondary | ICD-10-CM

## 2017-07-26 DIAGNOSIS — K210 Gastro-esophageal reflux disease with esophagitis: Secondary | ICD-10-CM | POA: Diagnosis not present

## 2017-07-26 LAB — COMPREHENSIVE METABOLIC PANEL
ALBUMIN: 3.8 g/dL (ref 3.5–5.0)
ALT: 12 U/L — ABNORMAL LOW (ref 17–63)
ANION GAP: 10 (ref 5–15)
AST: 25 U/L (ref 15–41)
Alkaline Phosphatase: 88 U/L (ref 38–126)
BUN: 51 mg/dL — ABNORMAL HIGH (ref 6–20)
CO2: 20 mmol/L — AB (ref 22–32)
Calcium: 9.1 mg/dL (ref 8.9–10.3)
Chloride: 106 mmol/L (ref 101–111)
Creatinine, Ser: 2.53 mg/dL — ABNORMAL HIGH (ref 0.61–1.24)
GFR calc Af Amer: 26 mL/min — ABNORMAL LOW (ref >60)
GFR calc non Af Amer: 22 mL/min — ABNORMAL LOW (ref >60)
GLUCOSE: 126 mg/dL — AB (ref 65–99)
POTASSIUM: 3.7 mmol/L (ref 3.5–5.1)
SODIUM: 136 mmol/L (ref 135–145)
TOTAL PROTEIN: 6.9 g/dL (ref 6.5–8.1)
Total Bilirubin: 0.6 mg/dL (ref 0.3–1.2)

## 2017-07-26 LAB — ABO/RH: ABO/RH(D): O NEG

## 2017-07-26 LAB — CBC
HCT: 25.5 % — ABNORMAL LOW (ref 40.0–52.0)
Hemoglobin: 8 g/dL — ABNORMAL LOW (ref 13.0–18.0)
MCH: 24.5 pg — ABNORMAL LOW (ref 26.0–34.0)
MCHC: 31.5 g/dL — AB (ref 32.0–36.0)
MCV: 77.8 fL — ABNORMAL LOW (ref 80.0–100.0)
Platelets: 418 10*3/uL (ref 150–440)
RBC: 3.28 MIL/uL — ABNORMAL LOW (ref 4.40–5.90)
RDW: 19.6 % — AB (ref 11.5–14.5)
WBC: 10.5 10*3/uL (ref 3.8–10.6)

## 2017-07-26 LAB — HEMOGLOBIN AND HEMATOCRIT, BLOOD
HEMATOCRIT: 22.8 % — AB (ref 40.0–52.0)
Hemoglobin: 7.3 g/dL — ABNORMAL LOW (ref 13.0–18.0)

## 2017-07-26 LAB — PREPARE RBC (CROSSMATCH)

## 2017-07-26 MED ORDER — SODIUM CHLORIDE 0.9 % IV SOLN
8.0000 mg/h | INTRAVENOUS | Status: DC
  Administered 2017-07-26 – 2017-07-28 (×4): 8 mg/h via INTRAVENOUS
  Filled 2017-07-26 (×4): qty 80

## 2017-07-26 MED ORDER — SODIUM CHLORIDE 0.9 % IV BOLUS (SEPSIS)
1000.0000 mL | Freq: Once | INTRAVENOUS | Status: AC
  Administered 2017-07-26: 1000 mL via INTRAVENOUS

## 2017-07-26 MED ORDER — SODIUM CHLORIDE 0.9 % IV SOLN: 10.0000 mL/h | Freq: Once | INTRAVENOUS | Status: DC

## 2017-07-26 MED ORDER — ACETAMINOPHEN 650 MG RE SUPP: 650.0000 mg | Freq: Four times a day (QID) | RECTAL | Status: DC | PRN

## 2017-07-26 MED ORDER — FINASTERIDE 5 MG PO TABS
5.0000 mg | ORAL_TABLET | Freq: Every day | ORAL | Status: DC
Start: 2017-07-27 — End: 2017-07-28
  Administered 2017-07-28: 5 mg via ORAL
  Filled 2017-07-26 (×2): qty 1

## 2017-07-26 MED ORDER — DEXTROSE-NACL 5-0.9 % IV SOLN
INTRAVENOUS | Status: DC
Start: 2017-07-26 — End: 2017-07-28
  Administered 2017-07-26 – 2017-07-28 (×3): via INTRAVENOUS

## 2017-07-26 MED ORDER — SODIUM CHLORIDE 0.9 % IV SOLN: 8.0000 mg/h | INTRAVENOUS | Status: DC

## 2017-07-26 MED ORDER — ACETAMINOPHEN 325 MG PO TABS: 650.0000 mg | ORAL_TABLET | Freq: Four times a day (QID) | ORAL | Status: DC | PRN

## 2017-07-26 MED ORDER — PANTOPRAZOLE SODIUM 40 MG IV SOLR: 40.0000 mg | Freq: Two times a day (BID) | INTRAVENOUS | Status: DC

## 2017-07-26 MED ORDER — ONDANSETRON HCL 4 MG/2ML IJ SOLN: 4.0000 mg | Freq: Four times a day (QID) | INTRAMUSCULAR | Status: DC | PRN

## 2017-07-26 MED ORDER — SODIUM CHLORIDE 0.9 % IV SOLN: 80.0000 mg | Freq: Once | INTRAVENOUS | Status: DC

## 2017-07-26 MED ORDER — ONDANSETRON HCL 4 MG PO TABS: 4.0000 mg | ORAL_TABLET | Freq: Four times a day (QID) | ORAL | Status: DC | PRN

## 2017-07-26 NOTE — H&P (Addendum)
[facility] NAME: Andrew [name]    MR#:  [id number]  DATE OF BIRTH:  Apr 04, 1933  DATE OF ADMISSION:  07/26/2017  PRIMARY CARE PHYSICIAN: [name], MD   REQUESTING/REFERRING PHYSICIAN:   CHIEF COMPLAINT:   Chief Complaint  Patient presents with  . blood draw    HISTORY OF PRESENT ILLNESS: Andrew [name]  is a 83 y.o. male with a known history of coronary artery disease, hypertension, carpal tunnel syndrome, gout, hyperlipidemia presented to the emergency room with weakness and dizziness.  Patient has been feeling dizzy for the last couple of days to a week.  He has generalized weakness and fatigue.  He was evaluated in the emergency room and his hemoglobin was low.  His stool guaiac was positive.  His hemoglobin has dropped from 10.7 from the last time he was seen to 8.4.  Patient has history of chronic kidney disease and his creatinine was also elevated during this presentation to the emergency room.  No nausea and vomiting.  No vomiting of blood.  No complaints of any abdominal pain.  Patient had a colonoscopy 7-8 months ago and polyp was removed.  Hospitalist service was consulted for further care.Currently on aspirin and plavix at home.  PAST MEDICAL HISTORY:   Past Medical History:  Diagnosis Date  . Arteriosclerosis of coronary artery 01/27/2014  . Benign essential HTN 12/11/2013  . Carpal tunnel syndrome   . Coronary atherosclerosis of native coronary artery   . Disorder of mitral valve 12/11/2013  . Encounter for long-term (current) use of antiplatelets/antithrombotics   . Gout   . Hyperlipidemia   . Hypertension   . Lung nodule    found on xray in Aug 2018  . Mitral valve disorder   . Osteoarthritis   . Prostate cancer ([place])     PAST SURGICAL HISTORY:  Past Surgical History:  Procedure Laterality Date  . COLONOSCOPY    . COLONOSCOPY WITH PROPOFOL N/A 07/23/2016   Procedure: COLONOSCOPY WITH PROPOFOL;  Surgeon:  Manya Silvas, MD;  Location: [facility] ENDOSCOPY;  Service: Endoscopy;  Laterality: N/A;  . CORONARY ANGIOPLASTY WITH STENT PLACEMENT    . ESOPHAGOGASTRODUODENOSCOPY (EGD) WITH PROPOFOL N/A 07/23/2016   Procedure: ESOPHAGOGASTRODUODENOSCOPY (EGD) WITH PROPOFOL;  Surgeon: Manya Silvas, MD;  Location: [facility] ENDOSCOPY;  Service: Endoscopy;  Laterality: N/A;  . PROSTATE BIOPSY      SOCIAL HISTORY:  Social History   Tobacco Use  . Smoking status: Never Smoker  . Smokeless tobacco: Never Used  Substance Use Topics  . Alcohol use: No    FAMILY HISTORY:  Family History  Problem Relation Age of Onset  . Cancer Mother        stomach and uterus  . Cancer Sister        breast    DRUG ALLERGIES: No Known Allergies  REVIEW OF SYSTEMS:   CONSTITUTIONAL: No fever,has fatigueand weakness.  EYES: No blurred or double vision.  EARS, NOSE, AND THROAT: No tinnitus or ear pain.  RESPIRATORY: No cough, shortness of breath, wheezing or hemoptysis.  CARDIOVASCULAR: No chest pain, orthopnea, edema.  GASTROINTESTINAL: No nausea, vomiting, diarrhea or abdominal pain.  Has melena GENITOURINARY: No dysuria, hematuria.  ENDOCRINE: No polyuria, nocturia,  HEMATOLOGY: No anemia, easy bruising or bleeding SKIN: No rash or lesion. MUSCULOSKELETAL: No joint pain or arthritis.   NEUROLOGIC: No tingling, numbness, weakness.  PSYCHIATRY: No anxiety or depression.   MEDICATIONS AT HOME:  Prior to Admission  medications   Medication Sig Start Date End Date Taking? Authorizing Provider  allopurinol (ZYLOPRIM) 300 MG tablet Take 300 mg by mouth daily.  02/20/17  Yes [provider]  aspirin 325 MG tablet Take 325 mg by mouth daily.    Yes [provider]  atorvastatin (LIPITOR) 40 MG tablet Take 40 mg by mouth daily. 04/24/15  Yes [provider]  clopidogrel (PLAVIX) 75 MG tablet Take 75 mg by mouth daily. 04/24/15  Yes [provider]  finasteride (PROSCAR) 5 MG tablet  Take 5 mg by mouth daily. 04/13/15  Yes [provider]  furosemide (LASIX) 20 MG tablet Take 20 mg by mouth 2 (two) times daily.   Yes [provider]  lisinopril (PRINIVIL,ZESTRIL) 5 MG tablet Take 5 mg by mouth daily. 07/24/17  Yes [provider]  meloxicam (MOBIC) 15 MG tablet Take 15 mg by mouth daily.   Yes [provider]  metolazone (ZAROXOLYN) 5 MG tablet Take 5 mg by mouth daily.   Yes [provider]      PHYSICAL EXAMINATION:   VITAL SIGNS: Blood pressure (!) 161/75, pulse 93, temperature 98 F (36.7 C), temperature source Oral, resp. rate 15, weight 69.4 kg (153 lb), SpO2 100 %.  GENERAL:  83 y.o.-year-old patient lying in the bed with no acute distress.  EYES: Pupils equal, round, reactive to light and accommodation. No scleral icterus. Extraocular muscles intact. Pallor present. HEENT: Head atraumatic, normocephalic. Oropharynx and nasopharynx clear.  NECK:  Supple, no jugular venous distention. No thyroid enlargement, no tenderness.  LUNGS: Normal breath sounds bilaterally, no wheezing, rales,rhonchi or crepitation. No use of accessory muscles of respiration.  CARDIOVASCULAR: S1, S2 normal. No murmurs, rubs, or gallops.  ABDOMEN: Soft, nontender, nondistended. Bowel sounds present. No organomegaly or mass.  EXTREMITIES: No pedal edema, cyanosis, or clubbing.  NEUROLOGIC: Cranial nerves II through XII are intact. Muscle strength 5/5 in all extremities. Sensation intact. Gait not checked.  PSYCHIATRIC: The patient is alert and oriented x 3.  SKIN: No obvious rash, lesion, or ulcer.   LABORATORY PANEL:   CBC Recent Labs  Lab 07/25/17 1151 07/26/17 1253  WBC 9.5 10.5  HGB 8.7* 8.0*  HCT 26.9* 25.5*  PLT 406 418  MCV 77.1* 77.8*  MCH 24.9* 24.5*  MCHC 32.2 31.5*  RDW 20.2* 19.6*   ------------------------------------------------------------------------------------------------------------------  Chemistries  Recent  Labs  Lab 07/25/17 1151 07/26/17 1253  NA 138 136  K 3.9 3.7  CL 106 106  CO2 21* 20*  GLUCOSE 98 126*  BUN 60* 51*  CREATININE 2.71* 2.53*  CALCIUM 9.2 9.1  AST  --  25  ALT  --  12*  ALKPHOS  --  88  BILITOT  --  0.6   ------------------------------------------------------------------------------------------------------------------ estimated creatinine clearance is 22.1 mL/min (A) (by C-G formula based on SCr of 2.53 mg/dL (H)). ------------------------------------------------------------------------------------------------------------------ No results for input(s): TSH, T4TOTAL, T3FREE, THYROIDAB in the last 72 hours.  Invalid input(s): FREET3   Coagulation profile No results for input(s): INR, PROTIME in the last 168 hours. ------------------------------------------------------------------------------------------------------------------- No results for input(s): DDIMER in the last 72 hours. -------------------------------------------------------------------------------------------------------------------  Cardiac Enzymes No results for input(s): CKMB, TROPONINI, MYOGLOBIN in the last 168 hours.  Invalid input(s): CK ------------------------------------------------------------------------------------------------------------------ Invalid input(s): POCBNP  ---------------------------------------------------------------------------------------------------------------  Urinalysis    Component Value Date/Time   COLORURINE STRAW (A) 04/08/2017 1320   APPEARANCEUR CLEAR (A) 04/08/2017 1320   LABSPEC 1.006 04/08/2017 1320   PHURINE 5.0 04/08/2017 1320   GLUCOSEU NEGATIVE  04/08/2017 1320   HGBUR NEGATIVE 04/08/2017 1320   BILIRUBINUR NEGATIVE 04/08/2017 1320   KETONESUR NEGATIVE 04/08/2017 1320   PROTEINUR NEGATIVE 04/08/2017 1320   NITRITE NEGATIVE 04/08/2017 1320   LEUKOCYTESUR NEGATIVE 04/08/2017 1320     RADIOLOGY: Dg Chest 2 View  Result Date:  07/26/2017 CLINICAL DATA:  GI bleeding EXAM: CHEST  2 VIEW COMPARISON:  05/01/2017 FINDINGS: Stable partially calcified nodule in the right middle lobe similar to that seen on the prior CT examination. Cardiac shadow is stable. The lungs are otherwise clear. No bony abnormality is noted. IMPRESSION: Stable benign-appearing nodule in the right middle lobe. No acute abnormality noted. Electronically Signed   By: Inez Catalina M.D.   On: 07/26/2017 14:52    EKG: Orders placed or performed during the hospital encounter of 07/25/17  . ED EKG  . ED EKG  . EKG 12-Lead  . EKG 12-Lead    IMPRESSION AND PLAN: 83 year old male patient with history of coronary disease, hypertension, hyperlipidemia, gout, mitral valve disorder presented to the emergency room with dizziness, fatigue and weakness.  Stool guaiac is positive.  Admitting diagnosis 1.  Gastrointestinal bleeding 2.  Anemia 3.  Acute on chronic renal failure 4.  Hypertension 5 coronary artery disease. Treatment plan Admit patient to medical floor IV fluid hydration Serial hemoglobin hematocrit monitoring IV Protonix drip Gastroenterology consultation Monitor renal function Keep patient n.p.o.  All the records are reviewed and case discussed with ED provider. Management plans discussed with the patient, family and they are in agreement.  CODE STATUS:FULL CODE Code Status History    Date Active Date Inactive Code Status Order ID Comments User Context   04/08/2017 15:55 04/10/2017 15:26 Full Code [id number]  Dustin Flock, MD Inpatient       TOTAL TIME TAKING CARE OF THIS PATIENT: 52 minutes.    Saundra Shelling M.D on 07/26/2017 at 4:43 PM  Between 7am to 6pm - Pager - [phone]  After 6pm go to [email or web address] - password [email or web address] [facility]  [place]  Office  [phone]  CC: Primary care physician; [name], MD

## 2017-07-26 NOTE — ED Provider Notes (Signed)
[facility] Emergency Department Provider Note  ____________________________________________  Time seen: Approximately 3:54 PM  I have reviewed the triage vital signs and the nursing notes.   HISTORY  Chief Complaint blood draw   HPI Andrew [name] is a 83 y.o. male with a history of CAD on Plavix, hypertension, hyperlipidemia, mitral valve regurgitation who presents for evaluation of dizziness. Patient seen here yesterday complaining of dizziness. Found to have worsening anemia and AoCKD. Recommended to be admitted however patient was unable to stay yesterday due to personal issues. Patient returns today. Found to have guaiac positive melena on rectal exam. Hgb continues to drop. patient denies alcohol use or NSAID use. No prior history of GI bleed. He is taking Plavix. Patient has never noted those were black. Patient he continues to feel dizzy mostly when he stands up, the dizziness happens several times a day and resolves when he sits down. He denies headache or CP.   Past Medical History:  Diagnosis Date  . Arteriosclerosis of coronary artery 01/27/2014  . Benign essential HTN 12/11/2013  . Carpal tunnel syndrome   . Coronary atherosclerosis of native coronary artery   . Disorder of mitral valve 12/11/2013  . Encounter for long-term (current) use of antiplatelets/antithrombotics   . Gout   . Hyperlipidemia   . Hypertension   . Lung nodule    found on xray in Aug 2018  . Mitral valve disorder   . Osteoarthritis   . Prostate cancer [facility])     Patient Active Problem List   Diagnosis Date Noted  . ARF (acute renal failure) ([place]) 04/08/2017  . PAD (peripheral artery disease) ([place]) 06/04/2016  . Colon polyp 06/29/2015  . Malignant neoplasm of prostate ([place]) 11/14/2014  . Combined fat and carbohydrate induced hyperlipemia 07/29/2014  . Long term current use of antithrombotics/antiplatelets 01/27/2014  . Arteriosclerosis of coronary artery 01/27/2014  .  Carpal tunnel syndrome 12/11/2013  . Benign essential HTN 12/11/2013  . Lesion of ulnar nerve 12/11/2013  . Disorder of mitral valve 12/11/2013  . Arthritis, degenerative 12/11/2013  . Hereditary and idiopathic neuropathy 12/11/2013    Past Surgical History:  Procedure Laterality Date  . COLONOSCOPY    . COLONOSCOPY WITH PROPOFOL N/A 07/23/2016   Procedure: COLONOSCOPY WITH PROPOFOL;  Surgeon: Manya Silvas, MD;  Location: [facility] ENDOSCOPY;  Service: Endoscopy;  Laterality: N/A;  . CORONARY ANGIOPLASTY WITH STENT PLACEMENT    . ESOPHAGOGASTRODUODENOSCOPY (EGD) WITH PROPOFOL N/A 07/23/2016   Procedure: ESOPHAGOGASTRODUODENOSCOPY (EGD) WITH PROPOFOL;  Surgeon: Manya Silvas, MD;  Location: [facility] ENDOSCOPY;  Service: Endoscopy;  Laterality: N/A;  . PROSTATE BIOPSY      Prior to Admission medications   Medication Sig Start Date End Date Taking? Authorizing Provider  allopurinol (ZYLOPRIM) 100 MG tablet Take 400 mg by mouth daily. 02/20/17   [provider]  aspirin 325 MG tablet Take 325 mg by mouth daily.     [provider]  atorvastatin (LIPITOR) 40 MG tablet Take 40 mg by mouth daily. 04/24/15   [provider]  clopidogrel (PLAVIX) 75 MG tablet Take 75 mg by mouth daily. 04/24/15   [provider]  finasteride (PROSCAR) 5 MG tablet Take 5 mg by mouth daily. 04/13/15   [provider]  meloxicam (MOBIC) 15 MG tablet Take 15 mg by mouth daily.    [provider]    Allergies Patient has no known allergies.  Family History  Problem Relation Age of Onset  . Cancer  Mother        stomach and uterus  . Cancer Sister        breast    Social History Social History   Tobacco Use  . Smoking status: Never Smoker  . Smokeless tobacco: Never Used  Substance Use Topics  . Alcohol use: No  . Drug use: No    Review of Systems  Constitutional: Negative for fever. + dizziness Eyes: Negative for visual changes. ENT: Negative for  sore throat. Neck: No neck pain  Cardiovascular: Negative for chest pain. Respiratory: Negative for shortness of breath. Gastrointestinal: Negative for abdominal pain, vomiting or diarrhea. + melena Genitourinary: Negative for dysuria. Musculoskeletal: Negative for back pain. Skin: Negative for rash. Neurological: Negative for headaches, weakness or numbness. Psych: No SI or HI  ____________________________________________   PHYSICAL EXAM:  VITAL SIGNS: ED Triage Vitals  Enc Vitals Group     BP 07/26/17 1251 124/65     Pulse Rate 07/26/17 1251 95     Resp 07/26/17 1251 16     Temp 07/26/17 1251 (!) 97.5 F (36.4 C)     Temp Source 07/26/17 1251 Oral     SpO2 07/26/17 1251 100 %     Weight 07/26/17 1252 153 lb (69.4 kg)     Height --      Head Circumference --      Peak Flow --      Pain Score 07/26/17 1251 0     Pain Loc --      Pain Edu? --      Excl. in [place]? --     Constitutional: Alert and oriented. Well appearing and in no apparent distress. HEENT:      Head: Normocephalic and atraumatic.         Eyes: Conjunctivae are normal. Sclera is non-icteric.       Mouth/Throat: Mucous membranes are moist.       Neck: Supple with no signs of meningismus. Cardiovascular: Regular rate and rhythm. No murmurs, gallops, or rubs. 2+ symmetrical distal pulses are present in all extremities. No JVD. Respiratory: Normal respiratory effort. Lungs are clear to auscultation bilaterally. No wheezes, crackles, or rhonchi.  Gastrointestinal: Soft, non tender, and non distended with positive bowel sounds. No rebound or guarding. Genitourinary: No CVA tenderness. Rectal exam performed by midlevel showing melena guaiac positive.  Musculoskeletal: Nontender with normal range of motion in all extremities. No edema, cyanosis, or erythema of extremities. Neurologic: Normal speech and language. Face is symmetric. Moving all extremities. No gross focal neurologic deficits are appreciated. Skin: Skin  is warm, dry and intact. No rash noted. Psychiatric: Mood and affect are normal. Speech and behavior are normal.  ____________________________________________   LABS (all labs ordered are listed, but only abnormal results are displayed)  Labs Reviewed  CBC - Abnormal; Notable for the following components:      Result Value   RBC 3.28 (*)    Hemoglobin 8.0 (*)    HCT 25.5 (*)    MCV 77.8 (*)    MCH 24.5 (*)    MCHC 31.5 (*)    RDW 19.6 (*)    All other components within normal limits  COMPREHENSIVE METABOLIC PANEL - Abnormal; Notable for the following components:   CO2 20 (*)    Glucose, Bld 126 (*)    BUN 51 (*)    Creatinine, Ser 2.53 (*)    ALT 12 (*)    GFR calc non Af Amer 22 (*)  GFR calc Af Amer 26 (*)    All other components within normal limits  TYPE AND SCREEN  PREPARE RBC (CROSSMATCH)   ____________________________________________  EKG  none  ____________________________________________  RADIOLOGY  CXR:  Stable benign-appearing nodule in the right middle lobe. No acute abnormality noted. ____________________________________________   PROCEDURES  Procedure(s) performed: None Procedures Critical Care performed:  None ____________________________________________   INITIAL IMPRESSION / ASSESSMENT AND PLAN / ED COURSE   83 y.o. male with a history of CAD on Plavix, hypertension, hyperlipidemia, mitral valve regurgitation who presents for evaluation of dizziness. HD stable. Rectal exam showing melena guaiac positive. Hgb dropping fruther more since yesterday from 8.7 to 8.0. AoCKD. Will give IVF, protonix, admit for endoscopy and monitoring. Type and cross sent.       As part of my medical decision making, I reviewed the following data within the [facility] notes reviewed and incorporated, Labs reviewed , Old chart reviewed, Discussed with admitting physician , Notes from prior ED visits and [place] Controlled Substance  Database    Pertinent labs & imaging results that were available during my care of the patient were reviewed by me and considered in my medical decision making (see chart for details).    ____________________________________________   FINAL CLINICAL IMPRESSION(S) / ED DIAGNOSES  Final diagnoses:  UGIB (upper gastrointestinal bleed)  Acute renal failure superimposed on chronic kidney disease, unspecified CKD stage, unspecified acute renal failure type ([place])      NEW MEDICATIONS STARTED DURING THIS VISIT:  ED Discharge Orders    None       Note:  This document was prepared using Dragon voice recognition software and may include unintentional dictation errors.    Rudene Re, MD 07/26/17 [phone]

## 2017-07-26 NOTE — ED Triage Notes (Signed)
Pt to ed for redraw of labs from yesterday.  Pt states he was told to have labs repeated today.  Pt states his MD is unable to see him today.  Pt denies dizziness today.  Pt also denies blurred vision today.  Pt alert and oriented at this time.

## 2017-07-26 NOTE — ED Provider Notes (Signed)
[facility] Emergency Department Provider Note  ____________________________________________   None    (approximate)  I have reviewed the triage vital signs and the nursing notes.   HISTORY  Chief Complaint blood draw    HPI Andrew [name] is a 83 y.o. male states he is here to get his blood rechecked, he was seen in the emergency room department yesterday for dizziness, the doctor had wanted him to stay but he left because he had another emergency, he states he has seen some dark stools over the last week, states he feels a little better since he had a liter of fluid yesterday  Past Medical History:  Diagnosis Date  . Arteriosclerosis of coronary artery 01/27/2014  . Benign essential HTN 12/11/2013  . Carpal tunnel syndrome   . Coronary atherosclerosis of native coronary artery   . Disorder of mitral valve 12/11/2013  . Encounter for long-term (current) use of antiplatelets/antithrombotics   . Gout   . Hyperlipidemia   . Hypertension   . Lung nodule    found on xray in Aug 2018  . Mitral valve disorder   . Osteoarthritis   . Prostate cancer [facility])     Patient Active Problem List   Diagnosis Date Noted  . ARF (acute renal failure) ([place]) 04/08/2017  . PAD (peripheral artery disease) ([place]) 06/04/2016  . Colon polyp 06/29/2015  . Malignant neoplasm of prostate ([place]) 11/14/2014  . Combined fat and carbohydrate induced hyperlipemia 07/29/2014  . Long term current use of antithrombotics/antiplatelets 01/27/2014  . Arteriosclerosis of coronary artery 01/27/2014  . Carpal tunnel syndrome 12/11/2013  . Benign essential HTN 12/11/2013  . Lesion of ulnar nerve 12/11/2013  . Disorder of mitral valve 12/11/2013  . Arthritis, degenerative 12/11/2013  . Hereditary and idiopathic neuropathy 12/11/2013    Past Surgical History:  Procedure Laterality Date  . COLONOSCOPY    . COLONOSCOPY WITH PROPOFOL N/A 07/23/2016   Procedure: COLONOSCOPY WITH PROPOFOL;   Surgeon: Manya Silvas, MD;  Location: [facility] ENDOSCOPY;  Service: Endoscopy;  Laterality: N/A;  . CORONARY ANGIOPLASTY WITH STENT PLACEMENT    . ESOPHAGOGASTRODUODENOSCOPY (EGD) WITH PROPOFOL N/A 07/23/2016   Procedure: ESOPHAGOGASTRODUODENOSCOPY (EGD) WITH PROPOFOL;  Surgeon: Manya Silvas, MD;  Location: [facility] ENDOSCOPY;  Service: Endoscopy;  Laterality: N/A;  . PROSTATE BIOPSY      Prior to Admission medications   Medication Sig Start Date End Date Taking? Authorizing Provider  allopurinol (ZYLOPRIM) 100 MG tablet Take 400 mg by mouth daily. 02/20/17   [provider]  aspirin 325 MG tablet Take 325 mg by mouth daily.     [provider]  atorvastatin (LIPITOR) 40 MG tablet Take 40 mg by mouth daily. 04/24/15   [provider]  clopidogrel (PLAVIX) 75 MG tablet Take 75 mg by mouth daily. 04/24/15   [provider]  finasteride (PROSCAR) 5 MG tablet Take 5 mg by mouth daily. 04/13/15   [provider]  meloxicam (MOBIC) 15 MG tablet Take 15 mg by mouth daily.    [provider]    Allergies Patient has no known allergies.  Family History  Problem Relation Age of Onset  . Cancer Mother        stomach and uterus  . Cancer Sister        breast    Social History Social History   Tobacco Use  . Smoking status: Never Smoker  . Smokeless tobacco: Never Used  Substance Use Topics  . Alcohol use:  No  . Drug use: No    Review of Systems  Constitutional: No fever/chills, positive weakness and dizziness Eyes: No visual changes. ENT: No sore throat. Respiratory: Denies cough ABD: Denies abdominal pain Genitourinary: Negative for dysuria. Musculoskeletal: Negative for back pain. Skin: Negative for rash.    ____________________________________________   PHYSICAL EXAM:  VITAL SIGNS: ED Triage Vitals  Enc Vitals Group     BP 07/26/17 1251 124/65     Pulse Rate 07/26/17 1251 95     Resp 07/26/17 1251 16     Temp  07/26/17 1251 (!) 97.5 F (36.4 C)     Temp Source 07/26/17 1251 Oral     SpO2 07/26/17 1251 100 %     Weight 07/26/17 1252 153 lb (69.4 kg)     Height --      Head Circumference --      Peak Flow --      Pain Score 07/26/17 1251 0     Pain Loc --      Pain Edu? --      Excl. in [place]? --     Constitutional: Alert and oriented. Well appearing and in no acute distress. Eyes: Conjunctivae are normal.  Head: Atraumatic. Nose: No congestion/rhinnorhea. Mouth/Throat: Mucous membranes are moist.   Cardiovascular: Normal rate, regular rhythm.  Positive for heart murmur Respiratory: Normal respiratory effort.  No retractions, lungs clear to auscultation ABD: Soft nontender, rectal exam has stool in the vault, Hemoccult was positive GU: deferred Musculoskeletal: FROM all extremities, warm and well perfused Neurologic:  Normal speech and language.  Skin:  Skin is warm, dry and intact. No rash noted. Psychiatric: Mood and affect are normal. Speech and behavior are normal.  ____________________________________________   LABS (all labs ordered are listed, but only abnormal results are displayed)  Labs Reviewed  CBC - Abnormal; Notable for the following components:      Result Value   RBC 3.28 (*)    Hemoglobin 8.0 (*)    HCT 25.5 (*)    MCV 77.8 (*)    MCH 24.5 (*)    MCHC 31.5 (*)    RDW 19.6 (*)    All other components within normal limits  COMPREHENSIVE METABOLIC PANEL - Abnormal; Notable for the following components:   CO2 20 (*)    Glucose, Bld 126 (*)    BUN 51 (*)    Creatinine, Ser 2.53 (*)    ALT 12 (*)    GFR calc non Af Amer 22 (*)    GFR calc Af Amer 26 (*)    All other components within normal limits  TYPE AND SCREEN   ____________________________________________   ____________________________________________  RADIOLOGY  Chest x-ray ordered  ____________________________________________   PROCEDURES  Procedure(s) performed:  No      ____________________________________________   INITIAL IMPRESSION / ASSESSMENT AND PLAN / ED COURSE  Pertinent labs & imaging results that were available during my care of the patient were reviewed by me and considered in my medical decision making (see chart for details).  Patient is an 83 year old male that is returning from his visit yesterday for a recheck of his hemoglobin and hematocrit, but there is been a steady trend of decrease in the hemoglobin and hematocrit, his Hemoccult was positive, discussed with Dr. Kerman Passey, he recommended the patient be admitted, the patient will be transferred to the major side for admission      ____________________________________________   FINAL CLINICAL IMPRESSION(S) / ED DIAGNOSES  Final diagnoses:  None      NEW MEDICATIONS STARTED DURING THIS VISIT:  This SmartLink is deprecated. Use AVSMEDLIST instead to display the medication list for a patient.   Note:  This document was prepared using Dragon voice recognition software and may include unintentional dictation errors.    Versie Starks, PA-C 07/26/17 1439    Earleen Newport, MD 07/26/17 [phone]

## 2017-07-26 NOTE — ED Notes (Signed)
Attempted report, was told the nurse would call back.

## 2017-07-27 LAB — HEMOGLOBIN AND HEMATOCRIT, BLOOD
HEMATOCRIT: 25.9 % — AB (ref 40.0–52.0)
HEMATOCRIT: 26.1 % — AB (ref 40.0–52.0)
HEMATOCRIT: 20.9 % — AB (ref 40.0–52.0)
HEMATOCRIT: 21.3 % — AB (ref 40.0–52.0)
HEMOGLOBIN: 8.3 g/dL — AB (ref 13.0–18.0)
Hemoglobin: 6.9 g/dL — ABNORMAL LOW (ref 13.0–18.0)
Hemoglobin: 7 g/dL — ABNORMAL LOW (ref 13.0–18.0)
Hemoglobin: 8.5 g/dL — ABNORMAL LOW (ref 13.0–18.0)

## 2017-07-27 LAB — BASIC METABOLIC PANEL
Anion gap: 7 (ref 5–15)
BUN: 38 mg/dL — AB (ref 6–20)
CHLORIDE: 111 mmol/L (ref 101–111)
CO2: 21 mmol/L — ABNORMAL LOW (ref 22–32)
CREATININE: 2.04 mg/dL — AB (ref 0.61–1.24)
Calcium: 8.7 mg/dL — ABNORMAL LOW (ref 8.9–10.3)
GFR calc Af Amer: 33 mL/min — ABNORMAL LOW (ref >60)
GFR calc non Af Amer: 29 mL/min — ABNORMAL LOW (ref >60)
GLUCOSE: 98 mg/dL (ref 65–99)
POTASSIUM: 3.7 mmol/L (ref 3.5–5.1)
SODIUM: 139 mmol/L (ref 135–145)

## 2017-07-27 LAB — RETICULOCYTES
RBC.: 3.18 MIL/uL — ABNORMAL LOW (ref 4.40–5.90)
RETIC CT PCT: 2.9 % (ref 0.4–3.1)
Retic Count, Absolute: 92.2 10*3/uL (ref 19.0–183.0)

## 2017-07-27 LAB — FERRITIN: Ferritin: 21 ng/mL — ABNORMAL LOW (ref 24–336)

## 2017-07-27 LAB — IRON AND TIBC
Iron: 88 ug/dL (ref 45–182)
SATURATION RATIOS: 24 % (ref 17.9–39.5)
TIBC: 367 ug/dL (ref 250–450)
UIBC: 279 ug/dL

## 2017-07-27 LAB — VITAMIN B12: VITAMIN B 12: 387 pg/mL (ref 180–914)

## 2017-07-27 LAB — FOLATE: FOLATE: 12.5 ng/mL (ref >5.9)

## 2017-07-27 MED ORDER — SODIUM CHLORIDE 0.9 % IV SOLN
Freq: Once | INTRAVENOUS | Status: AC
  Administered 2017-07-27: 10:00:00 via INTRAVENOUS

## 2017-07-27 MED ORDER — LISINOPRIL 10 MG PO TABS
5.0000 mg | ORAL_TABLET | Freq: Every day | ORAL | Status: DC
  Administered 2017-07-28: 5 mg via ORAL
  Filled 2017-07-27 (×2): qty 1

## 2017-07-27 MED ORDER — ACETAMINOPHEN 325 MG PO TABS
650.0000 mg | ORAL_TABLET | Freq: Once | ORAL | Status: DC
  Filled 2017-07-27: qty 2

## 2017-07-27 MED ORDER — ATORVASTATIN CALCIUM 20 MG PO TABS
40.0000 mg | ORAL_TABLET | Freq: Every day | ORAL | Status: DC
  Administered 2017-07-28: 40 mg via ORAL
  Filled 2017-07-27 (×2): qty 2

## 2017-07-27 NOTE — Consult Note (Signed)
Vonda Antigua, MD [address] [place], [place], [place], [address] [address], [place], [place], [place], [address] Phone: [phone]  Fax: [phone]  Consultation  Referring Provider:     Dr. Benjie Karvonen Primary Care Physician:  [name], MD Primary Gastroenterologist:  Dr. Chip Boer       Reason for Consultation:     Anemia  Date of Admission:  07/26/2017 Date of Consultation:  07/27/2017         HPI:   Andrew [name] is a 83 y.o. male with hx of NSAID use and on DAPT with ASA, Plavix for CAD presents with weakness and dizziness of 1 week duration and GI consulted for drop in Hemoglobin. Pt. Denies any melena or hematochezia or hematemesis or abdominal pain. He states he took his last Plavix dose on Tuesday morning.    He had an EGD in December 2017 for weight loss that showed reflux esophagitis, gastric erythema positive for H. pylori that was treated, and at 20 mm duodenal polyp that showed tubular adenoma on biopsy.  He was subsequently referred for EGD and EUS. EGD/EUS reports are not available but as per Feb 2018 Oncology consult in care everywhere "August 21, 2016: Upper endoscopy: Esophageal mucosal changes suspicious for short segment Barrett's esophagus. Diffuse moderate inflammation characterized by erythema in the entire examined stomach. 2 8-25 mm sessile polyps were found in the third portion the duodenum noting polyps were adjacent to each other. Attempted EMR was performed but polyp was unsuccessfully injected with a 1-10,000 solution of epinephrine with methylene blue and saline for lip polypectomy. Polyp did not lift adequately despite lifting. The mucosa adjacent to the polyps was tattooed with an injection of spot." "We recommend surgical resection but he is not interested. Dr. Donnal Moat and I discussed recommendation for duodenal resection with possible gastrojejunostomy. He understands the risk of malignancy and is not interested. He will RTC PRN. Dr. Mont Dutton  aware."   He also had a colonoscopy in December 2017 for polyp surveillance and polyps were removed completely which showed tubular adenoma, polypoid ganglioneuroma.   He was last seen by his cardiologist in September 2018, Dr. Ubaldo Glassing, who noted significant mitral regurg on his last note.   Past Medical History:  Diagnosis Date  . Arteriosclerosis of coronary artery 01/27/2014  . Benign essential HTN 12/11/2013  . Carpal tunnel syndrome   . Coronary atherosclerosis of native coronary artery   . Disorder of mitral valve 12/11/2013  . Encounter for long-term (current) use of antiplatelets/antithrombotics   . Gout   . Hyperlipidemia   . Hypertension   . Lung nodule    found on xray in Aug 2018  . Mitral valve disorder   . Osteoarthritis   . Prostate cancer [facility])     Past Surgical History:  Procedure Laterality Date  . COLONOSCOPY    . COLONOSCOPY WITH PROPOFOL N/A 07/23/2016   Procedure: COLONOSCOPY WITH PROPOFOL;  Surgeon: Manya Silvas, MD;  Location: [facility] ENDOSCOPY;  Service: Endoscopy;  Laterality: N/A;  . CORONARY ANGIOPLASTY WITH STENT PLACEMENT    . ESOPHAGOGASTRODUODENOSCOPY (EGD) WITH PROPOFOL N/A 07/23/2016   Procedure: ESOPHAGOGASTRODUODENOSCOPY (EGD) WITH PROPOFOL;  Surgeon: Manya Silvas, MD;  Location: [facility] ENDOSCOPY;  Service: Endoscopy;  Laterality: N/A;  . PROSTATE BIOPSY      Prior to Admission medications   Medication Sig Start Date End Date Taking? Authorizing Provider  allopurinol (ZYLOPRIM) 300 MG tablet Take 300 mg by mouth daily.  02/20/17  Yes [provider]  aspirin 325 MG tablet Take 325 mg by mouth daily.    Yes [provider]  atorvastatin (LIPITOR) 40 MG tablet Take 40 mg by mouth daily. 04/24/15  Yes [provider]  clopidogrel (PLAVIX) 75 MG tablet Take 75 mg by mouth daily. 04/24/15  Yes [provider]  finasteride (PROSCAR) 5 MG tablet Take 5 mg by mouth daily. 04/13/15  Yes [provider]    furosemide (LASIX) 20 MG tablet Take 20 mg by mouth 2 (two) times daily.   Yes [provider]  lisinopril (PRINIVIL,ZESTRIL) 5 MG tablet Take 5 mg by mouth daily. 07/24/17  Yes [provider]  meloxicam (MOBIC) 15 MG tablet Take 15 mg by mouth daily.   Yes [provider]  metolazone (ZAROXOLYN) 5 MG tablet Take 5 mg by mouth daily.   Yes [provider]    Family History  Problem Relation Age of Onset  . Cancer Mother        stomach and uterus  . Cancer Sister        breast     Social History   Tobacco Use  . Smoking status: Never Smoker  . Smokeless tobacco: Never Used  Substance Use Topics  . Alcohol use: No  . Drug use: No    Allergies as of 07/26/2017  . (No Known Allergies)    Review of Systems:    All systems reviewed and negative except where noted in HPI.   Physical Exam:  Vital signs in last 24 hours: Vitals:   07/27/17 0430 07/27/17 0939 07/27/17 1009 07/27/17 1200  BP: 128/64 122/66 138/71 (!) 143/64  Pulse: 87 79 84 89  Resp: 16 18 19 18   Temp: 98.1 F (36.7 C) 98.3 F (36.8 C) 98.1 F (36.7 C) (!) 97.5 F (36.4 C)  TempSrc: Oral Oral Oral Oral  SpO2: 100% 100% 100% 100%  Weight:      Height:       Last BM Date: 07/26/17 General:   Pleasant, cooperative in NAD Head:  Normocephalic and atraumatic. Eyes:   No icterus.   Conjunctiva pink. PERRLA. Ears:  Normal auditory acuity. Neck:  Supple; no masses or thyroidomegaly Abdomen:  Soft, nondistended, nontender. Normal bowel sounds. No appreciable masses or hepatomegaly.  No rebound or guarding.  Neurologic:  Alert and oriented x3;  grossly normal neurologically. Skin:  Intact without significant lesions or rashes. Cervical Nodes:  No significant cervical adenopathy. Psych:  Alert and cooperative. Normal affect.  LAB RESULTS: Recent Labs    07/25/17 1151 07/26/17 1253 07/26/17 1620 07/27/17 0022 07/27/17 0611  WBC 9.5 10.5  --   --   --   HGB 8.7* 8.0*  7.3* 6.9* 7.0*  HCT 26.9* 25.5* 22.8* 20.9* 21.3*  PLT 406 418  --   --   --    BMET Recent Labs    07/25/17 1151 07/26/17 1253 07/27/17 0611  NA 138 136 139  K 3.9 3.7 3.7  CL 106 106 111  CO2 21* 20* 21*  GLUCOSE 98 126* 98  BUN 60* 51* 38*  CREATININE 2.71* 2.53* 2.04*  CALCIUM 9.2 9.1 8.7*   LFT Recent Labs    07/26/17 1253  PROT 6.9  ALBUMIN 3.8  AST 25  ALT 12*  ALKPHOS 88  BILITOT 0.6   PT/INR No results for input(s): LABPROT, INR in the last 72 hours.  STUDIES: Dg Chest 2 View  Result Date: 07/26/2017 CLINICAL DATA:  GI bleeding EXAM:  CHEST  2 VIEW COMPARISON:  05/01/2017 FINDINGS: Stable partially calcified nodule in the right middle lobe similar to that seen on the prior CT examination. Cardiac shadow is stable. The lungs are otherwise clear. No bony abnormality is noted. IMPRESSION: Stable benign-appearing nodule in the right middle lobe. No acute abnormality noted. Electronically Signed   By: Inez Catalina M.D.   On: 07/26/2017 14:52      Impression / Plan:   JERIC SLAGEL is a 83 y.o. y/o male with weakness at home with anemia in the setting of NSAID use and will antiplatelet therapy for CAD  -Anemia No signs of active GI bleeding at this time Possible upper GI bleed due to peptic ulcer disease given acute anemia in setting of NSAID use and antiplatelet therapy Continue Protonix 40 IV twice daily Endoscopic hemostasis is considered a high risk procedure and recommendations are to hold Plavix for 3-5 days. He last took the dose on Tuesday morning. We will plan for EGD in 1-2 days Avoid NSAIDs Maintain 2 large-bore IV lines Serial CBCs and transfuse as needed  -Hx of duodenal polyp noted in HPI Pt. Refused surgery before as noted in HPI Follow up with his GI Dr. Tiffany Kocher as an outpatient in regard to this  Thank you for involving me in the care of this patient.      LOS: 1 day   Virgel Manifold, MD  07/27/2017, 1:19 PM

## 2017-07-27 NOTE — Progress Notes (Signed)
[place] [facility] [place] NAME: Andrew [name]    MR#:  [id number]  DATE OF BIRTH:  01/18/1933  SUBJECTIVE:   Patient presented to the emergency room due to dizziness and found to have low hemoglobin and guaiac positive stool. He denies melena, hematochezia. He had colonoscopy 8-9 months ago which he reports he had some polyps but these were noncancerous  REVIEW OF SYSTEMS:     Review of Systems  Constitutional: Negative for fever, chills weight loss HENT: Negative for ear pain, nosebleeds, congestion, facial swelling, rhinorrhea, neck pain, neck stiffness and ear discharge.   Respiratory: Negative for cough, shortness of breath, wheezing  Cardiovascular: Negative for chest pain, palpitations and leg swelling.  Gastrointestinal: Negative for heartburn, abdominal pain, vomiting, diarrhea or consitpation Genitourinary: Negative for dysuria, urgency, frequency, hematuria Musculoskeletal: Negative for back pain or joint pain Neurological: Negative for dizziness, seizures, syncope, focal weakness,  numbness and headaches.  Hematological: Does not bruise/bleed easily.  Psychiatric/Behavioral: Negative for hallucinations, confusion, dysphoric mood    Tolerating Diet:npo      DRUG ALLERGIES:  No Known Allergies  VITALS:  Blood pressure 128/64, pulse 87, temperature 98.1 F (36.7 C), temperature source Oral, resp. rate 16, height 5\' 9"  (1.753 m), weight 69.4 kg (153 lb), SpO2 100 %.  PHYSICAL EXAMINATION:  Constitutional: Appears well-developed and well-nourished. No distress. HENT: Normocephalic. Marland Kitchen Oropharynx is clear and moist.  Eyes: Conjunctivae and EOM are normal. PERRLA, no scleral icterus.  Neck: Normal ROM. Neck supple. No JVD. No tracheal deviation. CVS: RRR, S1/S2 +, 3/6 SEM no gallops, no carotid bruit.  Pulmonary: Effort and breath sounds normal, no stridor, rhonchi, wheezes, rales.  Abdominal: Soft. BS +,  no distension, tenderness,  rebound or guarding.  Musculoskeletal: Normal range of motion. No edema and no tenderness.  Neuro: Alert. CN 2-12 grossly intact. No focal deficits. Skin: Skin is warm and dry. No rash noted. Psychiatric: Normal mood and affect.      LABORATORY PANEL:   CBC Recent Labs  Lab 07/26/17 1253  07/27/17 0611  WBC 10.5  --   --   HGB 8.0*   < > 7.0*  HCT 25.5*   < > 21.3*  PLT 418  --   --    < > = values in this interval not displayed.   ------------------------------------------------------------------------------------------------------------------  Chemistries  Recent Labs  Lab 07/26/17 1253 07/27/17 0611  NA 136 139  K 3.7 3.7  CL 106 111  CO2 20* 21*  GLUCOSE 126* 98  BUN 51* 38*  CREATININE 2.53* 2.04*  CALCIUM 9.1 8.7*  AST 25  --   ALT 12*  --   ALKPHOS 88  --   BILITOT 0.6  --    ------------------------------------------------------------------------------------------------------------------  Cardiac Enzymes No results for input(s): TROPONINI in the last 168 hours. ------------------------------------------------------------------------------------------------------------------  RADIOLOGY:  Dg Chest 2 View  Result Date: 07/26/2017 CLINICAL DATA:  GI bleeding EXAM: CHEST  2 VIEW COMPARISON:  05/01/2017 FINDINGS: Stable partially calcified nodule in the right middle lobe similar to that seen on the prior CT examination. Cardiac shadow is stable. The lungs are otherwise clear. No bony abnormality is noted. IMPRESSION: Stable benign-appearing nodule in the right middle lobe. No acute abnormality noted. Electronically Signed   By: Inez Catalina M.D.   On: 07/26/2017 14:52     ASSESSMENT AND PLAN:   83 year old male with history of CAD, essential hypertension who presented to the emergency room due to weakness and  dizziness.  1. Acute blood loss anemia: Order anemia panel Patient has consented for blood transfusion Will order 1 unit Continue PPI Follow-up  in GI consultation  2. BPH: Continue finasteride  3. Essential hypertension: Continue lisinopril  4. CAD: Holding aspirin due to GI bleed as well as Plavix for now Continue statin.  Follows with Dr Ubaldo Glassing  5. Heart murmur: Known to patient Follows with Dr Ubaldo Glassing    Management plans discussed with the patient and he is in agreement.  CODE STATUS: full  TOTAL TIME TAKING CARE OF THIS PATIENT: 30 minutes.     POSSIBLE D/C 2 days, DEPENDING ON CLINICAL CONDITION.   Daxon Kyne M.D on 07/27/2017 at 8:25 AM  Between 7am to 6pm - Pager - [phone] After 6pm go to [email or web address] - password [email or web address] [facility]  Office  [phone]  CC: Primary care physician; [name], MD  Note: This dictation was prepared with Dragon dictation along with smaller phrase technology. Any transcriptional errors that result from this process are unintentional.

## 2017-07-27 NOTE — Progress Notes (Signed)
RN informed on call MD, Dr. Jannifer Franklin of most recent hemoglobin and hematocrit levels.  Plan to measure range after next lab draw (after 0500) regarding need to transfuse.

## 2017-07-28 ENCOUNTER — Inpatient Hospital Stay: Payer: Medicare Other | Admitting: Anesthesiology

## 2017-07-28 ENCOUNTER — Encounter
Admission: EM | Disposition: A | Discharge status: Home / Self Care | Payer: Self-pay | Source: Home / Self Care | Attending: Internal Medicine

## 2017-07-28 DIAGNOSIS — K253 Acute gastric ulcer without hemorrhage or perforation: Secondary | ICD-10-CM

## 2017-07-28 DIAGNOSIS — D62 Acute posthemorrhagic anemia: Secondary | ICD-10-CM

## 2017-07-28 DIAGNOSIS — D132 Benign neoplasm of duodenum: Secondary | ICD-10-CM

## 2017-07-28 DIAGNOSIS — K210 Gastro-esophageal reflux disease with esophagitis: Secondary | ICD-10-CM

## 2017-07-28 DIAGNOSIS — K219 Gastro-esophageal reflux disease without esophagitis: Secondary | ICD-10-CM

## 2017-07-28 HISTORY — PX: ESOPHAGOGASTRODUODENOSCOPY: SHX5428

## 2017-07-28 LAB — PROTIME-INR
INR: 1.06
Prothrombin Time: 13.7 seconds (ref 11.4–15.2)

## 2017-07-28 LAB — CBC
HCT: 23.7 % — ABNORMAL LOW (ref 40.0–52.0)
HEMOGLOBIN: 7.8 g/dL — AB (ref 13.0–18.0)
MCH: 25.6 pg — ABNORMAL LOW (ref 26.0–34.0)
MCHC: 32.8 g/dL (ref 32.0–36.0)
MCV: 78.2 fL — ABNORMAL LOW (ref 80.0–100.0)
Platelets: 341 10*3/uL (ref 150–440)
RBC: 3.03 MIL/uL — ABNORMAL LOW (ref 4.40–5.90)
RDW: 18.8 % — ABNORMAL HIGH (ref 11.5–14.5)
WBC: 9.6 10*3/uL (ref 3.8–10.6)

## 2017-07-28 LAB — HEMOGLOBIN AND HEMATOCRIT, BLOOD
HEMATOCRIT: 23.8 % — AB (ref 40.0–52.0)
HEMOGLOBIN: 7.7 g/dL — AB (ref 13.0–18.0)

## 2017-07-28 SURGERY — EGD (ESOPHAGOGASTRODUODENOSCOPY)
Anesthesia: General | Laterality: Left

## 2017-07-28 MED ORDER — FENTANYL CITRATE (PF) 100 MCG/2ML IJ SOLN: 25.0000 ug | INTRAMUSCULAR | Status: DC | PRN

## 2017-07-28 MED ORDER — ONDANSETRON HCL 4 MG/2ML IJ SOLN: 4.0000 mg | Freq: Once | INTRAMUSCULAR | Status: DC | PRN

## 2017-07-28 MED ORDER — PROPOFOL 500 MG/50ML IV EMUL
INTRAVENOUS | Status: AC
  Filled 2017-07-28: qty 50

## 2017-07-28 MED ORDER — PROPOFOL 500 MG/50ML IV EMUL
INTRAVENOUS | Status: DC | PRN
Start: 2017-07-28 — End: 2017-07-28
  Administered 2017-07-28: 150 ug/kg/min via INTRAVENOUS

## 2017-07-28 MED ORDER — SODIUM CHLORIDE 0.9 % IV SOLN
INTRAVENOUS | Status: DC
  Administered 2017-07-28: 09:00:00 via INTRAVENOUS

## 2017-07-28 MED ORDER — OMEPRAZOLE 20 MG PO CPDR: 20.0000 mg | DELAYED_RELEASE_CAPSULE | Freq: Two times a day (BID) | ORAL | 1 refills | Status: AC

## 2017-07-28 MED ORDER — PROPOFOL 10 MG/ML IV BOLUS
INTRAVENOUS | Status: DC | PRN
  Administered 2017-07-28: 40 mg via INTRAVENOUS

## 2017-07-28 NOTE — Discharge Summary (Addendum)
[place] [facility] [place] NAME: Andrew [name]    MR#:  [id number]  DATE OF BIRTH:  1933/03/25  DATE OF ADMISSION:  07/26/2017 ADMITTING PHYSICIAN: Saundra Shelling, MD  DATE OF DISCHARGE: 07/28/2017  PRIMARY CARE PHYSICIAN: [name], MD    ADMISSION DIAGNOSIS:  UGIB (upper gastrointestinal bleed) [K92.2] Acute renal failure superimposed on chronic kidney disease, unspecified CKD stage, unspecified acute renal failure type ([place]) [N17.9, N18.9]  DISCHARGE DIAGNOSIS:  Active Problems:   GI bleed   Acute posthemorrhagic anemia   Acute gastric ulcer without hemorrhage or perforation   Reflux esophagitis   SECONDARY DIAGNOSIS:   Past Medical History:  Diagnosis Date  . Arteriosclerosis of coronary artery 01/27/2014  . Benign essential HTN 12/11/2013  . Carpal tunnel syndrome   . Coronary atherosclerosis of native coronary artery   . Disorder of mitral valve 12/11/2013  . Encounter for long-term (current) use of antiplatelets/antithrombotics   . Gout   . Hyperlipidemia   . Hypertension   . Lung nodule    found on xray in Aug 2018  . Mitral valve disorder   . Osteoarthritis   . Prostate cancer [facility])     HOSPITAL COURSE:  83 year old male with history of CAD, essential hypertension who presented to the emergency room due to weakness and dizziness.  1. Acute blood loss anemia: Anemia panel shows low ferritin however normal iron, TIBC and saturation relation  Patient is status post 1 unit PRBC  Hemoglobin 7.7 this morning. EGD showed superficial gastric ulcers no sick moderate bleeding and esophagitis Recommendations are for PPI twice a day for 4 weeks then daily. Avoid NSAIDs. Patient will continue with aspirin I will stop Plavix and have patient follow up with his cardiologist as an outpatient at discharge. Follow-up with GI in 4 weeks      2. BPH: Continue finasteride  3. Essential hypertension: Continue lisinopril  4. CAD:  Holding aspirin due to GI bleed as well as Plavix for now Continue statin.  Follows with Dr Ubaldo Glassing  5. Mitral valveHeart murmur: Known to patient Follows with Dr Ubaldo Glassing     DISCHARGE CONDITIONS AND DIET:  Stable cardiac diet  CONSULTS OBTAINED:  Treatment Team:  Virgel Manifold, MD  DRUG ALLERGIES:  No Known Allergies  DISCHARGE MEDICATIONS:   Allergies as of 07/28/2017   No Known Allergies     Medication List    STOP taking these medications   clopidogrel 75 MG tablet Commonly known as:  PLAVIX   furosemide 20 MG tablet Commonly known as:  LASIX   lisinopril 5 MG tablet Commonly known as:  PRINIVIL,ZESTRIL   meloxicam 15 MG tablet Commonly known as:  MOBIC   metolazone 5 MG tablet Commonly known as:  ZAROXOLYN     TAKE these medications   allopurinol 300 MG tablet Commonly known as:  ZYLOPRIM Take 300 mg by mouth daily.   aspirin 325 MG tablet Take 325 mg by mouth daily.   atorvastatin 40 MG tablet Commonly known as:  LIPITOR Take 40 mg by mouth daily.   finasteride 5 MG tablet Commonly known as:  PROSCAR Take 5 mg by mouth daily.   omeprazole 20 MG capsule Commonly known as:  PRILOSEC Take 1 capsule (20 mg total) by mouth 2 (two) times daily.         Today   CHIEF COMPLAINT:  Had melena early this am   VITAL SIGNS:  Blood pressure 125/63, pulse 75, temperature 98  F (36.7 C), temperature source Oral, resp. rate 11, height 5\' 9"  (1.753 m), weight 69.4 kg (153 lb), SpO2 100 %.   REVIEW OF SYSTEMS:  Review of Systems  Constitutional: Negative.  Negative for chills, fever and malaise/fatigue.  HENT: Negative.  Negative for ear discharge, ear pain, hearing loss, nosebleeds and sore throat.   Eyes: Negative.  Negative for blurred vision and pain.  Respiratory: Negative.  Negative for cough, hemoptysis, shortness of breath and wheezing.   Cardiovascular: Negative.  Negative for chest pain, palpitations and leg swelling.   Gastrointestinal: Negative.  Negative for abdominal pain, blood in stool, diarrhea, nausea and vomiting.  Genitourinary: Negative.  Negative for dysuria.  Musculoskeletal: Negative.  Negative for back pain.  Skin: Negative.   Neurological: Negative for dizziness, tremors, speech change, focal weakness, seizures and headaches.  Endo/Heme/Allergies: Negative.  Does not bruise/bleed easily.  Psychiatric/Behavioral: Negative.  Negative for depression, hallucinations and suicidal ideas.     PHYSICAL EXAMINATION:  GENERAL:  83 y.o.-year-old patient lying in the bed with no acute distress.  NECK:  Supple, no jugular venous distention. No thyroid enlargement, no tenderness.  LUNGS: Normal breath sounds bilaterally, no wheezing, rales,rhonchi  No use of accessory muscles of respiration.  CARDIOVASCULAR: S1, S2 normal.3/6 SEMNO rubs, or gallops.  ABDOMEN: Soft, non-tender, non-distended. Bowel sounds present. No organomegaly or mass.  EXTREMITIES: No pedal edema, cyanosis, or clubbing.  PSYCHIATRIC: The patient is alert and oriented x 3.  SKIN: No obvious rash, lesion, or ulcer.   DATA REVIEW:   CBC Recent Labs  Lab 07/28/17 0043 07/28/17 0648  WBC 9.6  --   HGB 7.8* 7.7*  HCT 23.7* 23.8*  PLT 341  --     Chemistries  Recent Labs  Lab 07/26/17 1253 07/27/17 0611  NA 136 139  K 3.7 3.7  CL 106 111  CO2 20* 21*  GLUCOSE 126* 98  BUN 51* 38*  CREATININE 2.53* 2.04*  CALCIUM 9.1 8.7*  AST 25  --   ALT 12*  --   ALKPHOS 88  --   BILITOT 0.6  --     Cardiac Enzymes No results for input(s): TROPONINI in the last 168 hours.  Microbiology Results  @MICRORSLT48 @  RADIOLOGY:  Dg Chest 2 View  Result Date: 07/26/2017 CLINICAL DATA:  GI bleeding EXAM: CHEST  2 VIEW COMPARISON:  05/01/2017 FINDINGS: Stable partially calcified nodule in the right middle lobe similar to that seen on the prior CT examination. Cardiac shadow is stable. The lungs are otherwise clear. No bony  abnormality is noted. IMPRESSION: Stable benign-appearing nodule in the right middle lobe. No acute abnormality noted. Electronically Signed   By: Inez Catalina M.D.   On: 07/26/2017 14:52      Allergies as of 07/28/2017   No Known Allergies     Medication List    STOP taking these medications   clopidogrel 75 MG tablet Commonly known as:  PLAVIX   furosemide 20 MG tablet Commonly known as:  LASIX   lisinopril 5 MG tablet Commonly known as:  PRINIVIL,ZESTRIL   meloxicam 15 MG tablet Commonly known as:  MOBIC   metolazone 5 MG tablet Commonly known as:  ZAROXOLYN     TAKE these medications   allopurinol 300 MG tablet Commonly known as:  ZYLOPRIM Take 300 mg by mouth daily.   aspirin 325 MG tablet Take 325 mg by mouth daily.   atorvastatin 40 MG tablet Commonly known as:  LIPITOR Take 40 mg by  mouth daily.   finasteride 5 MG tablet Commonly known as:  PROSCAR Take 5 mg by mouth daily.   omeprazole 20 MG capsule Commonly known as:  PRILOSEC Take 1 capsule (20 mg total) by mouth 2 (two) times daily.          Management plans discussed with the patient and he is in agreement. Stable for discharge home  Patient should follow up with pcp  CODE STATUS:     Code Status Orders  (From admission, onward)        Start     Ordered   07/26/17 1729  Full code  Continuous     07/26/17 1728    Code Status History    Date Active Date Inactive Code Status Order ID Comments User Context   04/08/2017 15:55 04/10/2017 15:26 Full Code [id number]  Dustin Flock, MD Inpatient      TOTAL TIME TAKING CARE OF THIS PATIENT: 37 minutes.    Note: This dictation was prepared with Dragon dictation along with smaller phrase technology. Any transcriptional errors that result from this process are unintentional.  Tyce Delcid M.D on 07/28/2017 at 10:45 AM  Between 7am to 6pm - Pager - [phone] After 6pm go to [email or web address] - password [email or web address] [place] [facility]  [facility]  Office  [phone]  CC: Primary care physician; [name], MD

## 2017-07-28 NOTE — Progress Notes (Addendum)
[place] at [place] NAME: Andrew [name]    MR#:  [id number]  DATE OF BIRTH:  06/20/1933  SUBJECTIVE:   Had melena this am No abd pain underwent EGD showing  esophagitis and nonbleeding superficial gastric ulcers with no segmental or bleeding  REVIEW OF SYSTEMS:     Review of Systems  Constitutional: Negative for fever, chills weight loss HENT: Negative for ear pain, nosebleeds, congestion, facial swelling, rhinorrhea, neck pain, neck stiffness and ear discharge.   Respiratory: Negative for cough, shortness of breath, wheezing  Cardiovascular: Negative for chest pain, palpitations and leg swelling.  Gastrointestinal: Negative for heartburn, abdominal pain, vomiting, diarrhea or consitpation +melena this am Genitourinary: Negative for dysuria, urgency, frequency, hematuria Musculoskeletal: Negative for back pain or joint pain Neurological: Negative for dizziness, seizures, syncope, focal weakness,  numbness and headaches.  Hematological: Does not bruise/bleed easily.  Psychiatric/Behavioral: Negative for hallucinations, confusion, dysphoric mood    Tolerating Diet:npo      DRUG ALLERGIES:  No Known Allergies  VITALS:  Blood pressure 125/63, pulse 75, temperature 98 F (36.7 C), temperature source Oral, resp. rate 11, height 5\' 9"  (1.753 m), weight 69.4 kg (153 lb), SpO2 100 %.  PHYSICAL EXAMINATION:  Constitutional: Appears well-developed and well-nourished. No distress. HENT: Normocephalic. Marland Kitchen Oropharynx is clear and moist.  Eyes: Conjunctivae and EOM are normal. PERRLA, no scleral icterus.  Neck: Normal ROM. Neck supple. No JVD. No tracheal deviation. CVS: RRR, S1/S2 +, 3/6 SEM no gallops, no carotid bruit.  Pulmonary: Effort and breath sounds normal, no stridor, rhonchi, wheezes, rales.  Abdominal: Soft. BS +,  no distension, tenderness, rebound or guarding.  Musculoskeletal: Normal range of motion. No edema and no tenderness.   Neuro: Alert. CN 2-12 grossly intact. No focal deficits. Skin: Skin is warm and dry. No rash noted. Psychiatric: Normal mood and affect.      LABORATORY PANEL:   CBC Recent Labs  Lab 07/28/17 0043 07/28/17 0648  WBC 9.6  --   HGB 7.8* 7.7*  HCT 23.7* 23.8*  PLT 341  --    ------------------------------------------------------------------------------------------------------------------  Chemistries  Recent Labs  Lab 07/26/17 1253 07/27/17 0611  NA 136 139  K 3.7 3.7  CL 106 111  CO2 20* 21*  GLUCOSE 126* 98  BUN 51* 38*  CREATININE 2.53* 2.04*  CALCIUM 9.1 8.7*  AST 25  --   ALT 12*  --   ALKPHOS 88  --   BILITOT 0.6  --    ------------------------------------------------------------------------------------------------------------------  Cardiac Enzymes No results for input(s): TROPONINI in the last 168 hours. ------------------------------------------------------------------------------------------------------------------  RADIOLOGY:  Dg Chest 2 View  Result Date: 07/26/2017 CLINICAL DATA:  GI bleeding EXAM: CHEST  2 VIEW COMPARISON:  05/01/2017 FINDINGS: Stable partially calcified nodule in the right middle lobe similar to that seen on the prior CT examination. Cardiac shadow is stable. The lungs are otherwise clear. No bony abnormality is noted. IMPRESSION: Stable benign-appearing nodule in the right middle lobe. No acute abnormality noted. Electronically Signed   By: Inez Catalina M.D.   On: 07/26/2017 14:52     ASSESSMENT AND PLAN:   83 year old male with history of CAD, essential hypertension who presented to the emergency room due to weakness and dizziness.  1. Acute blood loss anemia: Anemia panel shows low ferritin however normal iron, TIBC and saturation relation  Patient is status post 1 unit PRBC  Hemoglobin 7.7 this morning. EGD showed superficial gastric ulcers no sick moderate bleeding  and esophagitis Recommendations are for PPI twice a day  for 4 weeks then daily. Avoid NSAIDs. Patient will continue with aspirin I will stop Plavix and have patient follow up with his cardiologist as an outpatient at discharge. Follow-up with GI in 4 weeks      2. BPH: Continue finasteride  3. Essential hypertension: Continue lisinopril  4. CAD: Holding aspirin due to GI bleed as well as Plavix for now Continue statin.  Follows with Dr Ubaldo Glassing  5. Heart murmur: Known to patient Follows with Dr Ubaldo Glassing    Management plans discussed with the patient and he is in agreement.  CODE STATUS: full  TOTAL TIME TAKING CARE OF THIS PATIENT: 24 minutes.     POSSIBLE D/C today DEPENDING ON CLINICAL CONDITION.   Harmonee Tozer M.D on 07/28/2017 at 10:49 AM  Between 7am to 6pm - Pager - [phone] After 6pm go to [email or web address] - password [email or web address] [facility]  Office  [phone]  CC: Primary care physician; [name], MD  Note: This dictation was prepared with Dragon dictation along with smaller phrase technology. Any transcriptional errors that result from this process are unintentional.

## 2017-07-28 NOTE — Anesthesia Post-op Follow-up Note (Signed)
Anesthesia QCDR form completed.        

## 2017-07-28 NOTE — Transfer of Care (Signed)
Immediate Anesthesia Transfer of Care Note  Patient: Andrew [name]  Procedure(s) Performed: ESOPHAGOGASTRODUODENOSCOPY (EGD) (Left )  Patient Location: PACU  Anesthesia Type:General  Level of Consciousness: sedated  Airway & Oxygen Therapy: Patient Spontanous Breathing  Post-op Assessment: Report given to RN and Post -op Vital signs reviewed and stable  Post vital signs: Reviewed and stable  Last Vitals:  Vitals:   07/28/17 0441 07/28/17 0954  BP: (!) 114/51   Pulse: 78   Resp: 16   Temp: 36.9 C (P) 36.6 C  SpO2: 100%     Last Pain:  Vitals:   07/28/17 0730  TempSrc:   PainSc: 0-No pain         Complications: No apparent anesthesia complications

## 2017-07-28 NOTE — Op Note (Addendum)
[facility]astroenterology Patient Name: Andrew [name] Procedure Date: 07/28/2017 9:29 AM MRN: [id number] Account #: [id number] Date of Birth: March 31, 1933 Admit Type: Outpatient Age: 83 Room: [facility] ENDO ROOM 4 Gender: Male Note Status: Finalized Procedure:            Upper GI endoscopy Indications:          Acute post hemorrhagic anemia Providers:            Avanni Turnbaugh B. Bonna Gains MD, MD Referring MD:         Irven Easterly. Kary Kos, MD (Referring MD) Medicines:            Monitored Anesthesia Care Complications:        No immediate complications. Procedure:            Pre-Anesthesia Assessment:                       - The risks and benefits of the procedure and the                        sedation options and risks were discussed with the                        patient. All questions were answered and informed                        consent was obtained.                       - Patient identification and proposed procedure were                        verified prior to the procedure.                       - ASA Grade Assessment: III - A patient with severe                        systemic disease.                       After obtaining informed consent, the endoscope was                        passed under direct vision. Throughout the procedure,                        the patient's blood pressure, pulse, and oxygen                        saturations were monitored continuously. The Endoscope                        was introduced through the mouth, and advanced to the                        third part of duodenum. The upper GI endoscopy was                        accomplished with ease. The patient tolerated the  procedure well. Findings:      LA Grade C (one or more mucosal breaks continuous between tops of 2 or       more mucosal folds, less than 75% circumference) esophagitis with no       bleeding was found.      Few non-bleeding superficial  gastric ulcers with no stigmata of bleeding       were found in the gastric antrum. The largest lesion was 3 mm in largest       dimension. Biopsies were not performed due to patient's recent bleeding.      No evidence of active or recent bleeding throughout the exam      Two previously known with tattoo at the site sessile polyps with no       bleeding were found in the third portion of the duodenum. Impression:           These were the likely source of patient's anemia                       - LA Grade C reflux esophagitis.                       - Non-bleeding gastric ulcers with no stigmata of                        bleeding. These were the likely source of patient's                        anemia and were likely NSAID induced. These are at low                        risk of rebleeding                       - No evidence of active or recent bleeding throughout                        the exam                       - Two previously known duodenal polyps seen at tattoo                        site.                       - No specimens collected. Recommendation:       - Return patient to hospital ward for ongoing care.                       - Advance diet as tolerated.                       - No further NSAIDs throughout his lifetime except for                        Aspirin if indicated medically.                       - Observe patient's clinical course.                       -  Obtain Stool for H. Pylori                       - Use Prilosec (omeprazole) 20 mg PO BID for 4 weeks                        and then daily for 4 weeks and then can stop if no                        further anemia and need for NSAIDs.                       - Repeat upper endoscopy in 3 months to check healing                        of esophagitis and biopsy for Barrett's. This can be                        done at his primary Gastroenterologists office, Dr.                        Vira Agar as he would like to continue  follow up with                        them.                       - If Plavix is restarted (as determined by primary team                        and Cardiology) Patient's Hemoglobin should be                        monitored periodically. Would recommend a repeat CBC in                        1 weeek after restarting PLavix. This can be done by PCP                       - Follow up with PCP in 1 week. Please make this                        appointment prior to discharge                       - Return to GI clinic with Dr. Vira Agar in 1 month for                        follow up Esophagitis, hx of duodenal polyp, and post                        hospital follow up. Patient refuses surgery referral                        for duodenal polyp at this time as well. He should                        follow up in his GI clinic in this  regard as well. Procedure Code(s):    --- Professional ---                       [phone], Esophagogastroduodenoscopy, flexible, transoral;                        diagnostic, including collection of specimen(s) by                        brushing or washing, when performed (separate procedure) Diagnosis Code(s):    --- Professional ---                       K21.0, Gastro-esophageal reflux disease with esophagitis                       K25.9, Gastric ulcer, unspecified as acute or chronic,                        without hemorrhage or perforation                       D62, Acute posthemorrhagic anemia CPT copyright 2016 American Medical Association. All rights reserved. The codes documented in this report are preliminary and upon coder review may  be revised to meet current compliance requirements.  Vonda Antigua, MD Margretta Sidle B. Bonna Gains MD, MD 07/28/2017 10:02:18 AM This report has been signed electronically. Number of Addenda: 0 Note Initiated On: 07/28/2017 9:29 AM      [facility]

## 2017-07-28 NOTE — Anesthesia Postprocedure Evaluation (Signed)
Anesthesia Post Note  Patient: Andrew [name]  Procedure(s) Performed: ESOPHAGOGASTRODUODENOSCOPY (EGD) (Left )  Patient location during evaluation: PACU Anesthesia Type: General Level of consciousness: awake and alert Pain management: pain level controlled Vital Signs Assessment: post-procedure vital signs reviewed and stable Respiratory status: spontaneous breathing and respiratory function stable Cardiovascular status: stable Anesthetic complications: no     Last Vitals:  Vitals:   07/28/17 0441 07/28/17 0954  BP: (!) 114/51 (!) 95/47  Pulse: 78 78  Resp: 16 (!) 24  Temp: 36.9 C 36.6 C  SpO2: 100% 100%    Last Pain:  Vitals:   07/28/17 0954  TempSrc:   PainSc: Asleep                 [name]

## 2017-07-28 NOTE — Progress Notes (Signed)
Andrew [name]  A and O x 4. VSS. Pt tolerating diet well. No complaints of pain or nausea. IV removed intact, prescriptions given. Pt voiced understanding of discharge instructions with no further questions. Pt discharged via wheelchair with axillary.    Allergies as of 07/28/2017   No Known Allergies     Medication List    STOP taking these medications   clopidogrel 75 MG tablet Commonly known as:  PLAVIX   furosemide 20 MG tablet Commonly known as:  LASIX   lisinopril 5 MG tablet Commonly known as:  PRINIVIL,ZESTRIL   meloxicam 15 MG tablet Commonly known as:  MOBIC   metolazone 5 MG tablet Commonly known as:  ZAROXOLYN     TAKE these medications   allopurinol 300 MG tablet Commonly known as:  ZYLOPRIM Take 300 mg by mouth daily.   aspirin 325 MG tablet Take 325 mg by mouth daily.   atorvastatin 40 MG tablet Commonly known as:  LIPITOR Take 40 mg by mouth daily.   finasteride 5 MG tablet Commonly known as:  PROSCAR Take 5 mg by mouth daily.   omeprazole 20 MG capsule Commonly known as:  PRILOSEC Take 1 capsule (20 mg total) by mouth 2 (two) times daily.       Vitals:   07/28/17 1025 07/28/17 1038  BP: 123/70 125/63  Pulse: 75 75  Resp: 11   Temp: 97.8 F (36.6 C) 98 F (36.7 C)  SpO2: 100% 100%    Andrew [name]

## 2017-07-28 NOTE — Anesthesia Preprocedure Evaluation (Signed)
Anesthesia Evaluation  Patient identified by MRN, date of birth, ID band Patient awake    Reviewed: Allergy & Precautions, NPO status , Patient's Chart, lab work & pertinent test results  History of Anesthesia Complications Negative for: history of anesthetic complications  Airway Mallampati: II       Dental  (+) Chipped, Missing   Pulmonary neg sleep apnea, neg COPD,           Cardiovascular hypertension, + CAD, + Cardiac Stents and + Peripheral Vascular Disease       Neuro/Psych neg Seizures    GI/Hepatic Neg liver ROS, neg GERD  ,  Endo/Other  neg diabetes  Renal/GU Renal InsufficiencyRenal disease     Musculoskeletal   Abdominal   Peds  Hematology   Anesthesia Other Findings   Reproductive/Obstetrics                             Anesthesia Physical Anesthesia Plan  ASA: III and emergent  Anesthesia Plan: General   Post-op Pain Management:    Induction:   PONV Risk Score and Plan: 2 and TIVA, Propofol infusion and Treatment may vary due to age or medical condition  Airway Management Planned:   Additional Equipment:   Intra-op Plan:   Post-operative Plan:   Informed Consent: I have reviewed the patients History and Physical, chart, labs and discussed the procedure including the risks, benefits and alternatives for the proposed anesthesia with the patient or authorized representative who has indicated his/her understanding and acceptance.     Plan Discussed with:   Anesthesia Plan Comments:         Anesthesia Quick Evaluation

## 2017-07-30 ENCOUNTER — Encounter: Payer: Self-pay | Admitting: Gastroenterology

## 2017-07-30 LAB — BPAM RBC
Blood Product Expiration Date: 201812252359
Blood Product Expiration Date: 201812252359
ISSUE DATE / TIME: 201812150940
UNIT TYPE AND RH: 9500
UNIT TYPE AND RH: 9500

## 2017-07-30 LAB — TYPE AND SCREEN
ABO/RH(D): O NEG
Antibody Screen: NEGATIVE
UNIT DIVISION: 0
Unit division: 0

## 2017-08-02 ENCOUNTER — Ambulatory Visit
Admission: RE | Admit: 2017-08-02 | Discharge: 2017-08-02 | Disposition: A | Discharge status: Home / Self Care | Payer: Medicare Other | Source: Ambulatory Visit | Attending: Unknown Physician Specialty | Admitting: Unknown Physician Specialty

## 2017-08-02 DIAGNOSIS — D508 Other iron deficiency anemias: Secondary | ICD-10-CM | POA: Diagnosis present

## 2017-08-02 DIAGNOSIS — D500 Iron deficiency anemia secondary to blood loss (chronic): Secondary | ICD-10-CM | POA: Insufficient documentation

## 2017-08-02 DIAGNOSIS — K922 Gastrointestinal hemorrhage, unspecified: Secondary | ICD-10-CM | POA: Insufficient documentation

## 2017-08-02 LAB — HEMOGLOBIN: Hemoglobin: 8.4 g/dL — ABNORMAL LOW (ref 13.0–18.0)

## 2017-08-02 LAB — PREPARE RBC (CROSSMATCH)

## 2017-08-03 LAB — TYPE AND SCREEN
ABO/RH(D): O NEG
Antibody Screen: NEGATIVE
Unit division: 0

## 2017-08-03 LAB — BPAM RBC
Blood Product Expiration Date: 201901062359
ISSUE DATE / TIME: 201812211001
UNIT TYPE AND RH: 9500

## 2017-09-13 ENCOUNTER — Other Ambulatory Visit: Payer: Self-pay | Admitting: Family Medicine

## 2017-09-13 DIAGNOSIS — R911 Solitary pulmonary nodule: Secondary | ICD-10-CM

## 2017-09-24 ENCOUNTER — Ambulatory Visit
Admission: RE | Admit: 2017-09-24 | Discharge: 2017-09-24 | Disposition: A | Discharge status: Home / Self Care | Payer: Medicare Other | Source: Ambulatory Visit | Attending: Family Medicine | Admitting: Family Medicine

## 2017-09-24 DIAGNOSIS — R911 Solitary pulmonary nodule: Secondary | ICD-10-CM | POA: Diagnosis present

## 2017-09-24 DIAGNOSIS — I700 Atherosclerosis of aorta: Secondary | ICD-10-CM | POA: Insufficient documentation

## 2017-10-30 ENCOUNTER — Emergency Department: Payer: Medicare Other

## 2017-10-30 ENCOUNTER — Other Ambulatory Visit: Payer: Self-pay

## 2017-10-30 ENCOUNTER — Inpatient Hospital Stay
Admission: EM | Admit: 2017-10-30 | Discharge: 2017-11-02 | DRG: 291 | Disposition: A | Discharge status: Home / Self Care | Payer: Medicare Other | Attending: Internal Medicine | Admitting: Internal Medicine

## 2017-10-30 DIAGNOSIS — N189 Chronic kidney disease, unspecified: Secondary | ICD-10-CM | POA: Diagnosis present

## 2017-10-30 DIAGNOSIS — I248 Other forms of acute ischemic heart disease: Secondary | ICD-10-CM | POA: Diagnosis present

## 2017-10-30 DIAGNOSIS — I10 Essential (primary) hypertension: Secondary | ICD-10-CM | POA: Diagnosis present

## 2017-10-30 DIAGNOSIS — I429 Cardiomyopathy, unspecified: Secondary | ICD-10-CM | POA: Diagnosis present

## 2017-10-30 DIAGNOSIS — N179 Acute kidney failure, unspecified: Secondary | ICD-10-CM | POA: Diagnosis present

## 2017-10-30 DIAGNOSIS — K219 Gastro-esophageal reflux disease without esophagitis: Secondary | ICD-10-CM | POA: Diagnosis present

## 2017-10-30 DIAGNOSIS — R059 Cough, unspecified: Secondary | ICD-10-CM

## 2017-10-30 DIAGNOSIS — I130 Hypertensive heart and chronic kidney disease with heart failure and stage 1 through stage 4 chronic kidney disease, or unspecified chronic kidney disease: Secondary | ICD-10-CM | POA: Diagnosis not present

## 2017-10-30 DIAGNOSIS — B349 Viral infection, unspecified: Secondary | ICD-10-CM | POA: Diagnosis present

## 2017-10-30 DIAGNOSIS — R05 Cough: Secondary | ICD-10-CM

## 2017-10-30 DIAGNOSIS — I5023 Acute on chronic systolic (congestive) heart failure: Secondary | ICD-10-CM | POA: Diagnosis present

## 2017-10-30 DIAGNOSIS — Z79899 Other long term (current) drug therapy: Secondary | ICD-10-CM

## 2017-10-30 DIAGNOSIS — I25110 Atherosclerotic heart disease of native coronary artery with unstable angina pectoris: Secondary | ICD-10-CM | POA: Diagnosis present

## 2017-10-30 DIAGNOSIS — R0602 Shortness of breath: Secondary | ICD-10-CM

## 2017-10-30 DIAGNOSIS — Z7982 Long term (current) use of aspirin: Secondary | ICD-10-CM

## 2017-10-30 DIAGNOSIS — T39395A Adverse effect of other nonsteroidal anti-inflammatory drugs [NSAID], initial encounter: Secondary | ICD-10-CM | POA: Diagnosis not present

## 2017-10-30 DIAGNOSIS — I5022 Chronic systolic (congestive) heart failure: Secondary | ICD-10-CM | POA: Diagnosis present

## 2017-10-30 DIAGNOSIS — R778 Other specified abnormalities of plasma proteins: Secondary | ICD-10-CM

## 2017-10-30 DIAGNOSIS — N183 Chronic kidney disease, stage 3 (moderate): Secondary | ICD-10-CM | POA: Diagnosis present

## 2017-10-30 DIAGNOSIS — I2510 Atherosclerotic heart disease of native coronary artery without angina pectoris: Secondary | ICD-10-CM | POA: Diagnosis present

## 2017-10-30 DIAGNOSIS — R531 Weakness: Secondary | ICD-10-CM

## 2017-10-30 DIAGNOSIS — I200 Unstable angina: Secondary | ICD-10-CM | POA: Diagnosis present

## 2017-10-30 DIAGNOSIS — R7989 Other specified abnormal findings of blood chemistry: Secondary | ICD-10-CM | POA: Diagnosis present

## 2017-10-30 DIAGNOSIS — N281 Cyst of kidney, acquired: Secondary | ICD-10-CM | POA: Diagnosis present

## 2017-10-30 DIAGNOSIS — Z8546 Personal history of malignant neoplasm of prostate: Secondary | ICD-10-CM

## 2017-10-30 DIAGNOSIS — R600 Localized edema: Secondary | ICD-10-CM | POA: Diagnosis present

## 2017-10-30 DIAGNOSIS — E785 Hyperlipidemia, unspecified: Secondary | ICD-10-CM | POA: Diagnosis present

## 2017-10-30 DIAGNOSIS — Z955 Presence of coronary angioplasty implant and graft: Secondary | ICD-10-CM

## 2017-10-30 DIAGNOSIS — I340 Nonrheumatic mitral (valve) insufficiency: Secondary | ICD-10-CM | POA: Diagnosis present

## 2017-10-30 LAB — BASIC METABOLIC PANEL
Anion gap: 11 (ref 5–15)
BUN: 49 mg/dL — AB (ref 6–20)
CHLORIDE: 111 mmol/L (ref 101–111)
CO2: 15 mmol/L — ABNORMAL LOW (ref 22–32)
Calcium: 9.1 mg/dL (ref 8.9–10.3)
Creatinine, Ser: 2.63 mg/dL — ABNORMAL HIGH (ref 0.61–1.24)
GFR calc Af Amer: 24 mL/min — ABNORMAL LOW (ref >60)
GFR calc non Af Amer: 21 mL/min — ABNORMAL LOW (ref >60)
GLUCOSE: 137 mg/dL — AB (ref 65–99)
POTASSIUM: 5.3 mmol/L — AB (ref 3.5–5.1)
SODIUM: 137 mmol/L (ref 135–145)

## 2017-10-30 LAB — CBC
HEMATOCRIT: 33.1 % — AB (ref 40.0–52.0)
Hemoglobin: 10.4 g/dL — ABNORMAL LOW (ref 13.0–18.0)
MCH: 23.4 pg — ABNORMAL LOW (ref 26.0–34.0)
MCHC: 31.3 g/dL — ABNORMAL LOW (ref 32.0–36.0)
MCV: 74.7 fL — AB (ref 80.0–100.0)
Platelets: 290 10*3/uL (ref 150–440)
RBC: 4.43 MIL/uL (ref 4.40–5.90)
RDW: 19.7 % — AB (ref 11.5–14.5)
WBC: 10.2 10*3/uL (ref 3.8–10.6)

## 2017-10-30 LAB — TROPONIN I: Troponin I: 0.44 ng/mL (ref <0.03)

## 2017-10-30 LAB — INFLUENZA PANEL BY PCR (TYPE A & B)
INFLBPCR: NEGATIVE
Influenza A By PCR: NEGATIVE

## 2017-10-30 MED ORDER — FUROSEMIDE 40 MG PO TABS
40.0000 mg | ORAL_TABLET | Freq: Every day | ORAL | Status: DC
  Administered 2017-10-30 – 2017-10-31 (×2): 40 mg via ORAL
  Filled 2017-10-30 (×2): qty 1

## 2017-10-30 MED ORDER — PANTOPRAZOLE SODIUM 40 MG PO TBEC
40.0000 mg | DELAYED_RELEASE_TABLET | Freq: Two times a day (BID) | ORAL | Status: DC
  Administered 2017-10-30 – 2017-11-02 (×6): 40 mg via ORAL
  Filled 2017-10-30 (×6): qty 1

## 2017-10-30 MED ORDER — FINASTERIDE 5 MG PO TABS
5.0000 mg | ORAL_TABLET | Freq: Every day | ORAL | Status: DC
  Administered 2017-10-31 – 2017-11-02 (×3): 5 mg via ORAL
  Filled 2017-10-30 (×3): qty 1

## 2017-10-30 MED ORDER — ONDANSETRON HCL 4 MG PO TABS: 4.0000 mg | ORAL_TABLET | Freq: Four times a day (QID) | ORAL | Status: DC | PRN

## 2017-10-30 MED ORDER — ASPIRIN 81 MG PO CHEW
324.0000 mg | CHEWABLE_TABLET | Freq: Once | ORAL | Status: AC
  Administered 2017-10-30: 324 mg via ORAL
  Filled 2017-10-30: qty 4

## 2017-10-30 MED ORDER — ATORVASTATIN CALCIUM 20 MG PO TABS: 40.0000 mg | ORAL_TABLET | Freq: Every day | ORAL | Status: DC

## 2017-10-30 MED ORDER — ACETAMINOPHEN 325 MG PO TABS: 650.0000 mg | ORAL_TABLET | Freq: Four times a day (QID) | ORAL | Status: DC | PRN

## 2017-10-30 MED ORDER — ATORVASTATIN CALCIUM 20 MG PO TABS
40.0000 mg | ORAL_TABLET | Freq: Every day | ORAL | Status: DC
  Administered 2017-10-30 – 2017-11-02 (×4): 40 mg via ORAL
  Filled 2017-10-30 (×4): qty 2

## 2017-10-30 MED ORDER — ACETAMINOPHEN 650 MG RE SUPP: 650.0000 mg | Freq: Four times a day (QID) | RECTAL | Status: DC | PRN

## 2017-10-30 MED ORDER — HEPARIN SODIUM (PORCINE) 5000 UNIT/ML IJ SOLN
5000.0000 [IU] | Freq: Three times a day (TID) | INTRAMUSCULAR | Status: DC
  Administered 2017-10-31 – 2017-11-01 (×4): 5000 [IU] via SUBCUTANEOUS
  Filled 2017-10-30 (×4): qty 1

## 2017-10-30 MED ORDER — FUROSEMIDE 40 MG PO TABS: 40.0000 mg | ORAL_TABLET | Freq: Every day | ORAL | Status: DC

## 2017-10-30 MED ORDER — PANTOPRAZOLE SODIUM 20 MG PO TBEC: 20.0000 mg | DELAYED_RELEASE_TABLET | Freq: Two times a day (BID) | ORAL | Status: DC

## 2017-10-30 MED ORDER — ONDANSETRON HCL 4 MG/2ML IJ SOLN: 4.0000 mg | Freq: Four times a day (QID) | INTRAMUSCULAR | Status: DC | PRN

## 2017-10-30 MED ORDER — ASPIRIN 325 MG PO TABS
325.0000 mg | ORAL_TABLET | Freq: Every day | ORAL | Status: DC
  Administered 2017-10-31 – 2017-11-02 (×3): 325 mg via ORAL
  Filled 2017-10-30 (×3): qty 1

## 2017-10-30 NOTE — H&P (Signed)
[place] [facility] [place] NAME: Andrew [name]    MR#:  [id number]  DATE OF BIRTH:  December 01, 1932  DATE OF ADMISSION:  10/30/2017  PRIMARY CARE PHYSICIAN: [name], MD   REQUESTING/REFERRING PHYSICIAN: Archie Balboa, MD  CHIEF COMPLAINT:   Chief Complaint  Patient presents with  . Cough    HISTORY OF PRESENT ILLNESS:  Andrew [name]  is a 84 y.o. male who presents with 2 to 3 days of cough, malaise.  Patient states that he is not been coughing up any sputum, and he denies any chest pain.  He has had some mild shortness of breath.  He denies any outright fevers, but he has had some episodes of chills.  He says that he had some nausea with couple episodes of vomiting.  He states that his symptoms feel "like the flu".  Here in the ED he was found to have an elevated troponin.  Chart review shows that he recently followed up with his cardiologist and had an echocardiogram done which shows ejection fraction of 35% with significant valvular pathology.  Hospitalist were called for admission and further evaluation.  PAST MEDICAL HISTORY:   Past Medical History:  Diagnosis Date  . Arteriosclerosis of coronary artery 01/27/2014  . Benign essential HTN 12/11/2013  . Carpal tunnel syndrome   . Coronary atherosclerosis of native coronary artery   . Disorder of mitral valve 12/11/2013  . Encounter for long-term (current) use of antiplatelets/antithrombotics   . Gout   . Hyperlipidemia   . Hypertension   . Lung nodule    found on xray in Aug 2018  . Mitral valve disorder   . Osteoarthritis   . Prostate cancer ([place])      PAST SURGICAL HISTORY:   Past Surgical History:  Procedure Laterality Date  . COLONOSCOPY    . COLONOSCOPY WITH PROPOFOL N/A 07/23/2016   Procedure: COLONOSCOPY WITH PROPOFOL;  Surgeon: Manya Silvas, MD;  Location: [facility] ENDOSCOPY;  Service: Endoscopy;  Laterality: N/A;  . CORONARY ANGIOPLASTY WITH STENT PLACEMENT    .  ESOPHAGOGASTRODUODENOSCOPY Left 07/28/2017   Procedure: ESOPHAGOGASTRODUODENOSCOPY (EGD);  Surgeon: Virgel Manifold, MD;  Location: [facility] ENDOSCOPY;  Service: Endoscopy;  Laterality: Left;  . ESOPHAGOGASTRODUODENOSCOPY (EGD) WITH PROPOFOL N/A 07/23/2016   Procedure: ESOPHAGOGASTRODUODENOSCOPY (EGD) WITH PROPOFOL;  Surgeon: Manya Silvas, MD;  Location: [facility] ENDOSCOPY;  Service: Endoscopy;  Laterality: N/A;  . PROSTATE BIOPSY       SOCIAL HISTORY:   Social History   Tobacco Use  . Smoking status: Never Smoker  . Smokeless tobacco: Never Used  Substance Use Topics  . Alcohol use: No     FAMILY HISTORY:   Family History  Problem Relation Age of Onset  . Cancer Mother        stomach and uterus  . Cancer Sister        breast     DRUG ALLERGIES:  No Known Allergies  MEDICATIONS AT HOME:   Prior to Admission medications   Medication Sig Start Date End Date Taking? Authorizing Provider  furosemide (LASIX) 40 MG tablet Take 40 mg by mouth daily.   Yes [provider]  allopurinol (ZYLOPRIM) 300 MG tablet Take 300 mg by mouth daily.  02/20/17   [provider]  aspirin 325 MG tablet Take 325 mg by mouth daily.     [provider]  atorvastatin (LIPITOR) 40 MG tablet Take 40 mg by mouth daily. 04/24/15   [provider]  finasteride (PROSCAR) 5 MG tablet Take 5 mg by mouth daily. 04/13/15   [provider]  omeprazole (PRILOSEC) 20 MG capsule Take 1 capsule (20 mg total) by mouth 2 (two) times daily. 07/28/17 07/28/18  Bettey Costa, MD    REVIEW OF SYSTEMS:  Review of Systems  Constitutional: Positive for malaise/fatigue. Negative for chills, fever and weight loss.  HENT: Negative for ear pain, hearing loss and tinnitus.   Eyes: Negative for blurred vision, double vision, pain and redness.  Respiratory: Positive for cough and shortness of breath. Negative for hemoptysis.   Cardiovascular: Positive for leg swelling. Negative for  chest pain, palpitations and orthopnea.  Gastrointestinal: Positive for nausea and vomiting. Negative for abdominal pain, constipation and diarrhea.  Genitourinary: Negative for dysuria, frequency and hematuria.  Musculoskeletal: Negative for back pain, joint pain and neck pain.  Skin:       No acne, rash, or lesions  Neurological: Negative for dizziness, tremors, focal weakness and weakness.  Endo/Heme/Allergies: Negative for polydipsia. Does not bruise/bleed easily.  Psychiatric/Behavioral: Negative for depression. The patient is not nervous/anxious and does not have insomnia.      VITAL SIGNS:   Vitals:   10/30/17 1916 10/30/17 1919 10/30/17 1922  BP: (!) 143/88    Pulse: (!) 52    Resp: 20    Temp: 98.2 F (36.8 C)    TempSrc: Oral    SpO2:   100%  Weight:  75.8 kg (167 lb)   Height:  5\' 9"  (1.753 m)    Wt Readings from Last 3 Encounters:  10/30/17 75.8 kg (167 lb)  07/26/17 69.4 kg (153 lb)  07/25/17 69.4 kg (153 lb)    PHYSICAL EXAMINATION:  Physical Exam  Vitals reviewed. Constitutional: He is oriented to person, place, and time. He appears well-developed and well-nourished. No distress.  HENT:  Head: Normocephalic and atraumatic.  Mouth/Throat: Oropharynx is clear and moist.  Eyes: Conjunctivae and EOM are normal. Pupils are equal, round, and reactive to light. No scleral icterus.  Neck: Normal range of motion. Neck supple. No JVD present. No thyromegaly present.  Cardiovascular: Normal rate, regular rhythm and intact distal pulses. Exam reveals no gallop and no friction rub.  No murmur heard. Respiratory: Effort normal and breath sounds normal. No respiratory distress. He has no wheezes. He has no rales.  GI: Soft. Bowel sounds are normal. He exhibits no distension. There is no tenderness.  Musculoskeletal: Normal range of motion. He exhibits edema (bilateral lower extremities).  No arthritis, no gout  Lymphadenopathy:    He has no cervical adenopathy.   Neurological: He is alert and oriented to person, place, and time. No cranial nerve deficit.  No dysarthria, no aphasia  Skin: Skin is warm and dry. No rash noted. No erythema.  Psychiatric: He has a normal mood and affect. His behavior is normal. Judgment and thought content normal.    LABORATORY PANEL:   CBC Recent Labs  Lab 10/30/17 1921  WBC 10.2  HGB 10.4*  HCT 33.1*  PLT 290   ------------------------------------------------------------------------------------------------------------------  Chemistries  Recent Labs  Lab 10/30/17 1921  NA 137  K 5.3*  CL 111  CO2 15*  GLUCOSE 137*  BUN 49*  CREATININE 2.63*  CALCIUM 9.1   ------------------------------------------------------------------------------------------------------------------  Cardiac Enzymes Recent Labs  Lab 10/30/17 1921  TROPONINI 0.44*   ------------------------------------------------------------------------------------------------------------------  RADIOLOGY:  Dg Chest 2 View  Result Date: 10/30/2017 CLINICAL DATA:  Cough chills and congestion EXAM: CHEST - 2 VIEW COMPARISON:  07/26/2017, CT chest 09/24/2017 FINDINGS: Trace pleural effusions. No focal consolidation. Enlarged cardiomediastinal silhouette with aortic atherosclerosis. Partially calcified right middle lobe lung nodule as noted on prior CT. IMPRESSION: 1. Trace pleural effusions.  No focal pulmonary infiltrate. 2. Mild cardiomegaly Electronically Signed   By: Donavan Foil M.D.   On: 10/30/2017 20:00    EKG:   Orders placed or performed during the hospital encounter of 10/30/17  . ED EKG within 10 minutes  . ED EKG within 10 minutes    IMPRESSION AND PLAN:  Principal Problem:   Elevated troponin -etiology at this time.  This is possibly some demand ischemia due to his heart failure under the stress of possible viral infection.  Influenza PCR is pending.  We will trend his cardiac enzymes, get a cardiology consult.  We will  not get an echocardiogram as he just had one with his primary physician in the last day or 2. Active Problems:   Arteriosclerosis of coronary artery -continue home meds   Acute renal failure superimposed on chronic kidney disease ([place]) -this is likely some cardiorenal syndrome, versus potentially some prerenal injury due to his vomiting and likely viral infection.  Will monitor closely and avoid nephrotoxins.   Benign essential HTN -continue home meds   Chronic systolic CHF (congestive heart failure) (HCC) -continue home medications   HLD (hyperlipidemia) -home dose statin   GERD (gastroesophageal reflux disease) -home dose PPI  Chart review performed and case discussed with ED provider. Labs, imaging and/or ECG reviewed by provider and discussed with patient/family. Management plans discussed with the patient and/or family.  DVT PROPHYLAXIS: SubQ heparin  GI PROPHYLAXIS: PPI  ADMISSION STATUS: Observation  CODE STATUS: Full Code Status History    Date Active Date Inactive Code Status Order ID Comments User Context   07/26/2017 17:28 07/28/2017 16:20 Full Code [id number]  Saundra Shelling, MD Inpatient   04/08/2017 15:55 04/10/2017 15:26 Full Code [id number]  Dustin Flock, MD Inpatient    Advance Directive Documentation     Most Recent Value  Type of Advance Directive  Living will  Pre-existing out of facility DNR order (yellow form or pink MOST form)  No data  "MOST" Form in Place?  No data      TOTAL TIME TAKING CARE OF THIS PATIENT: 40 minutes.   Yarisbel Miranda Delavan 10/30/2017, 9:17 PM  [facility]  Office  [phone]  CC: Primary care physician; [name], MD  Note:  This document was prepared using Dragon voice recognition software and may include unintentional dictation errors.

## 2017-10-30 NOTE — ED Provider Notes (Signed)
[facility] Emergency Department Provider Note  ____________________________________________   I have reviewed the triage vital signs and the nursing notes.   HISTORY  Chief Complaint Cough   History limited by: Not Limited   HPI Andrew [name] is a 84 y.o. male who presents to the emergency department today because of primary concern for cough and dizziness/weakness. Patient symptoms started three days ago. He has had some associated nausea and decreased appetite. Family has also noticed that he has been having some swelling in his lower extremities. Patient denies any chest pain but states he did have chest tightness one of the days. Denies any current chest pain or chest tightness. States he does have history of stent placement a number of years ago and still follows up with cardiology. No fevers.  Per medical record review patient has a history of CAD.   Past Medical History:  Diagnosis Date  . Arteriosclerosis of coronary artery 01/27/2014  . Benign essential HTN 12/11/2013  . Carpal tunnel syndrome   . Coronary atherosclerosis of native coronary artery   . Disorder of mitral valve 12/11/2013  . Encounter for long-term (current) use of antiplatelets/antithrombotics   . Gout   . Hyperlipidemia   . Hypertension   . Lung nodule    found on xray in Aug 2018  . Mitral valve disorder   . Osteoarthritis   . Prostate cancer [facility])     Patient Active Problem List   Diagnosis Date Noted  . Acute posthemorrhagic anemia   . Acute gastric ulcer without hemorrhage or perforation   . Reflux esophagitis   . GI bleed 07/26/2017  . ARF (acute renal failure) ([place]) 04/08/2017  . PAD (peripheral artery disease) ([place]) 06/04/2016  . Colon polyp 06/29/2015  . Malignant neoplasm of prostate ([place]) 11/14/2014  . Combined fat and carbohydrate induced hyperlipemia 07/29/2014  . Long term current use of antithrombotics/antiplatelets 01/27/2014  . Arteriosclerosis of coronary  artery 01/27/2014  . Carpal tunnel syndrome 12/11/2013  . Benign essential HTN 12/11/2013  . Lesion of ulnar nerve 12/11/2013  . Disorder of mitral valve 12/11/2013  . Arthritis, degenerative 12/11/2013  . Hereditary and idiopathic neuropathy 12/11/2013    Past Surgical History:  Procedure Laterality Date  . COLONOSCOPY    . COLONOSCOPY WITH PROPOFOL N/A 07/23/2016   Procedure: COLONOSCOPY WITH PROPOFOL;  Surgeon: Manya Silvas, MD;  Location: [facility] ENDOSCOPY;  Service: Endoscopy;  Laterality: N/A;  . CORONARY ANGIOPLASTY WITH STENT PLACEMENT    . ESOPHAGOGASTRODUODENOSCOPY Left 07/28/2017   Procedure: ESOPHAGOGASTRODUODENOSCOPY (EGD);  Surgeon: Virgel Manifold, MD;  Location: [facility] ENDOSCOPY;  Service: Endoscopy;  Laterality: Left;  . ESOPHAGOGASTRODUODENOSCOPY (EGD) WITH PROPOFOL N/A 07/23/2016   Procedure: ESOPHAGOGASTRODUODENOSCOPY (EGD) WITH PROPOFOL;  Surgeon: Manya Silvas, MD;  Location: [facility] ENDOSCOPY;  Service: Endoscopy;  Laterality: N/A;  . PROSTATE BIOPSY      Prior to Admission medications   Medication Sig Start Date End Date Taking? Authorizing Provider  allopurinol (ZYLOPRIM) 300 MG tablet Take 300 mg by mouth daily.  02/20/17   [provider]  aspirin 325 MG tablet Take 325 mg by mouth daily.     [provider]  atorvastatin (LIPITOR) 40 MG tablet Take 40 mg by mouth daily. 04/24/15   [provider]  finasteride (PROSCAR) 5 MG tablet Take 5 mg by mouth daily. 04/13/15   [provider]  omeprazole (PRILOSEC) 20 MG capsule Take 1 capsule (20 mg total) by mouth 2 (two) times daily. 07/28/17  07/28/18  Bettey Costa, MD    Allergies Patient has no known allergies.  Family History  Problem Relation Age of Onset  . Cancer Mother        stomach and uterus  . Cancer Sister        breast    Social History Social History   Tobacco Use  . Smoking status: Never Smoker  . Smokeless tobacco: Never Used  Substance Use Topics   . Alcohol use: No  . Drug use: No    Review of Systems Constitutional: No fevers. Positive for weakness. Eyes: No visual changes. ENT: No sore throat. Cardiovascular: Positive for chest tightness. Respiratory: Positive for cough. Gastrointestinal: No abdominal pain.  Positive for nausea and decreased appetite.  Genitourinary: Negative for dysuria. Musculoskeletal: Negative for back pain. Skin: Negative for rash. Neurological: Negative for headaches, focal weakness or numbness.  ____________________________________________   PHYSICAL EXAM:  VITAL SIGNS: ED Triage Vitals  Enc Vitals Group     BP 10/30/17 1916 (!) 143/88     Pulse Rate 10/30/17 1916 (!) 52     Resp 10/30/17 1916 20     Temp 10/30/17 1916 98.2 F (36.8 C)     Temp Source 10/30/17 1916 Oral     SpO2 10/30/17 1922 100 %     Weight 10/30/17 1919 167 lb (75.8 kg)     Height 10/30/17 1919 5\' 9"  (1.753 m)   Constitutional: Alert and oriented. Well appearing and in no distress. Eyes: Conjunctivae are normal.  ENT   Head: Normocephalic and atraumatic.   Nose: No congestion/rhinnorhea.   Mouth/Throat: Mucous membranes are moist.   Neck: No stridor. Hematological/Lymphatic/Immunilogical: No cervical lymphadenopathy. Cardiovascular: Normal rate, regular rhythm.  No murmurs, rubs, or gallops.  Respiratory: Normal respiratory effort without tachypnea nor retractions. Breath sounds are clear and equal bilaterally. No wheezes/rales/rhonchi. Gastrointestinal: Soft and non tender. No rebound. No guarding.  Genitourinary: Deferred Musculoskeletal: Normal range of motion in all extremities. Trace lower extremity swelling. Neurologic:  Normal speech and language. No gross focal neurologic deficits are appreciated.  Skin:  Skin is warm, dry and intact. No rash noted. Psychiatric: Mood and affect are normal. Speech and behavior are normal. Patient exhibits appropriate insight and  judgment.  ____________________________________________    LABS (pertinent positives/negatives)  Trop 0.44 BMP k 5.3, cr 2.63 CBC wbc 10.2, hgb 10.4, plt 290 ____________________________________________   EKG  I, Nance Pear, attending physician, personally viewed and interpreted this EKG  EKG Time: 1930 Rate: 102 Rhythm: sinus tachycardia with PVC Axis: normal Intervals: qtc 497 QRS: narrow ST changes: no st elevation Impression: abnormal ekg   ____________________________________________    RADIOLOGY  CXR Mild cardiomegaly, trace pleural effusion  ____________________________________________   PROCEDURES  Procedures  CRITICAL CARE Performed by: Nance Pear   Total critical care time: 20 minutes  Critical care time was exclusive of separately billable procedures and treating other patients.  Critical care was necessary to treat or prevent imminent or life-threatening deterioration.  Critical care was time spent personally by me on the following activities: development of treatment plan with patient and/or surrogate as well as nursing, discussions with consultants, evaluation of patient's response to treatment, examination of patient, obtaining history from patient or surrogate, ordering and performing treatments and interventions, ordering and review of laboratory studies, ordering and review of radiographic studies, pulse oximetry and re-evaluation of patient's condition.  ____________________________________________   INITIAL IMPRESSION / ASSESSMENT AND PLAN / ED COURSE  Pertinent labs & imaging results that  were available during my care of the patient were reviewed by me and considered in my medical decision making (see chart for details).  Patient presented to the emergency department for primary concern for cough and weakness/dizziness. Exam does show some lower extremity swelling. ddx would be broad including anemia, electrolyte  abnormality, cardiac etiology, infection amongst other etiologies. Work up is notable for elevated troponin. Discussed this finding with the patient. Will plan on admission.   ____________________________________________   FINAL CLINICAL IMPRESSION(S) / ED DIAGNOSES  Final diagnoses:  Cough  Weakness  Elevated troponin     Note: This dictation was prepared with Dragon dictation. Any transcriptional errors that result from this process are unintentional     Nance Pear, MD 10/30/17 2111

## 2017-10-30 NOTE — ED Triage Notes (Signed)
Pt to triage with cough, chills, congestion for 3 days.   No chest pain.   Pt alert

## 2017-10-30 NOTE — ED Notes (Signed)
Date and time results received: 10/30/17 2015   Test: Troponin Critical Value: 0.44  Name of Provider Notified: Dr. Archie Balboa  Orders Received? Or Actions Taken?: Acknowledged. Patient placed in room 1.

## 2017-10-31 ENCOUNTER — Inpatient Hospital Stay: Payer: Medicare Other

## 2017-10-31 DIAGNOSIS — I429 Cardiomyopathy, unspecified: Secondary | ICD-10-CM | POA: Diagnosis present

## 2017-10-31 DIAGNOSIS — I25110 Atherosclerotic heart disease of native coronary artery with unstable angina pectoris: Secondary | ICD-10-CM | POA: Diagnosis present

## 2017-10-31 DIAGNOSIS — Z7982 Long term (current) use of aspirin: Secondary | ICD-10-CM | POA: Diagnosis not present

## 2017-10-31 DIAGNOSIS — B349 Viral infection, unspecified: Secondary | ICD-10-CM | POA: Diagnosis present

## 2017-10-31 DIAGNOSIS — I200 Unstable angina: Secondary | ICD-10-CM | POA: Diagnosis present

## 2017-10-31 DIAGNOSIS — Z79899 Other long term (current) drug therapy: Secondary | ICD-10-CM | POA: Diagnosis not present

## 2017-10-31 DIAGNOSIS — I248 Other forms of acute ischemic heart disease: Secondary | ICD-10-CM | POA: Diagnosis present

## 2017-10-31 DIAGNOSIS — I340 Nonrheumatic mitral (valve) insufficiency: Secondary | ICD-10-CM | POA: Diagnosis present

## 2017-10-31 DIAGNOSIS — T39395A Adverse effect of other nonsteroidal anti-inflammatory drugs [NSAID], initial encounter: Secondary | ICD-10-CM | POA: Diagnosis not present

## 2017-10-31 DIAGNOSIS — I5023 Acute on chronic systolic (congestive) heart failure: Secondary | ICD-10-CM | POA: Diagnosis present

## 2017-10-31 DIAGNOSIS — I130 Hypertensive heart and chronic kidney disease with heart failure and stage 1 through stage 4 chronic kidney disease, or unspecified chronic kidney disease: Secondary | ICD-10-CM | POA: Diagnosis present

## 2017-10-31 DIAGNOSIS — N183 Chronic kidney disease, stage 3 (moderate): Secondary | ICD-10-CM | POA: Diagnosis present

## 2017-10-31 DIAGNOSIS — N179 Acute kidney failure, unspecified: Secondary | ICD-10-CM | POA: Diagnosis present

## 2017-10-31 DIAGNOSIS — R600 Localized edema: Secondary | ICD-10-CM | POA: Diagnosis present

## 2017-10-31 DIAGNOSIS — Z8546 Personal history of malignant neoplasm of prostate: Secondary | ICD-10-CM | POA: Diagnosis not present

## 2017-10-31 DIAGNOSIS — E785 Hyperlipidemia, unspecified: Secondary | ICD-10-CM | POA: Diagnosis present

## 2017-10-31 DIAGNOSIS — Z955 Presence of coronary angioplasty implant and graft: Secondary | ICD-10-CM | POA: Diagnosis not present

## 2017-10-31 DIAGNOSIS — N281 Cyst of kidney, acquired: Secondary | ICD-10-CM | POA: Diagnosis present

## 2017-10-31 DIAGNOSIS — K219 Gastro-esophageal reflux disease without esophagitis: Secondary | ICD-10-CM | POA: Diagnosis present

## 2017-10-31 LAB — CBC
HCT: 30.7 % — ABNORMAL LOW (ref 40.0–52.0)
Hemoglobin: 9.6 g/dL — ABNORMAL LOW (ref 13.0–18.0)
MCH: 23.3 pg — AB (ref 26.0–34.0)
MCHC: 31.1 g/dL — AB (ref 32.0–36.0)
MCV: 75 fL — ABNORMAL LOW (ref 80.0–100.0)
PLATELETS: 270 10*3/uL (ref 150–440)
RBC: 4.09 MIL/uL — ABNORMAL LOW (ref 4.40–5.90)
RDW: 19.8 % — ABNORMAL HIGH (ref 11.5–14.5)
WBC: 11.5 10*3/uL — ABNORMAL HIGH (ref 3.8–10.6)

## 2017-10-31 LAB — BASIC METABOLIC PANEL
Anion gap: 13 (ref 5–15)
BUN: 47 mg/dL — ABNORMAL HIGH (ref 6–20)
CALCIUM: 8.8 mg/dL — AB (ref 8.9–10.3)
CO2: 13 mmol/L — AB (ref 22–32)
CREATININE: 2.53 mg/dL — AB (ref 0.61–1.24)
Chloride: 109 mmol/L (ref 101–111)
GFR calc Af Amer: 26 mL/min — ABNORMAL LOW (ref >60)
GFR calc non Af Amer: 22 mL/min — ABNORMAL LOW (ref >60)
GLUCOSE: 110 mg/dL — AB (ref 65–99)
Potassium: 5.3 mmol/L — ABNORMAL HIGH (ref 3.5–5.1)
Sodium: 135 mmol/L (ref 135–145)

## 2017-10-31 LAB — TROPONIN I
TROPONIN I: 0.47 ng/mL — AB (ref <0.03)
Troponin I: 0.46 ng/mL (ref <0.03)

## 2017-10-31 MED ORDER — SODIUM CHLORIDE 0.9 % WEIGHT BASED INFUSION
1.0000 mL/kg/h | INTRAVENOUS | Status: DC
  Administered 2017-11-01: 1 mL/kg/h via INTRAVENOUS

## 2017-10-31 MED ORDER — SODIUM BICARBONATE BOLUS VIA INFUSION
INTRAVENOUS | Status: AC
  Administered 2017-11-01: 225 meq via INTRAVENOUS
  Filled 2017-10-31: qty 1

## 2017-10-31 MED ORDER — SODIUM CHLORIDE 0.9% FLUSH: 3.0000 mL | INTRAVENOUS | Status: DC | PRN

## 2017-10-31 MED ORDER — SODIUM CHLORIDE 0.9% FLUSH
3.0000 mL | Freq: Two times a day (BID) | INTRAVENOUS | Status: DC
  Administered 2017-10-31 (×2): 3 mL via INTRAVENOUS

## 2017-10-31 MED ORDER — ASPIRIN 81 MG PO CHEW
81.0000 mg | CHEWABLE_TABLET | ORAL | Status: AC
  Administered 2017-11-01: 81 mg via ORAL
  Filled 2017-10-31: qty 1

## 2017-10-31 MED ORDER — GUAIFENESIN-DM 100-10 MG/5ML PO SYRP
5.0000 mL | ORAL_SOLUTION | ORAL | Status: DC | PRN
  Administered 2017-10-31: 5 mL via ORAL
  Filled 2017-10-31: qty 5

## 2017-10-31 MED ORDER — FUROSEMIDE 10 MG/ML IJ SOLN
40.0000 mg | Freq: Once | INTRAMUSCULAR | Status: AC
Start: 2017-10-31 — End: 2017-10-31
  Administered 2017-10-31: 40 mg via INTRAVENOUS
  Filled 2017-10-31: qty 4

## 2017-10-31 MED ORDER — LORATADINE 10 MG PO TABS
10.0000 mg | ORAL_TABLET | Freq: Every day | ORAL | Status: DC
  Administered 2017-10-31 – 2017-11-02 (×3): 10 mg via ORAL
  Filled 2017-10-31 (×3): qty 1

## 2017-10-31 MED ORDER — SODIUM CHLORIDE 0.9 % IV SOLN: 250.0000 mL | INTRAVENOUS | Status: DC | PRN

## 2017-10-31 MED ORDER — SODIUM BICARBONATE 8.4 % IV SOLN
INTRAVENOUS | Status: AC
  Administered 2017-11-01: 07:00:00 via INTRAVENOUS
  Filled 2017-10-31: qty 1000

## 2017-10-31 MED ORDER — SODIUM CHLORIDE 0.9 % WEIGHT BASED INFUSION
3.0000 mL/kg/h | INTRAVENOUS | Status: DC
  Administered 2017-11-01: 3 mL/kg/h via INTRAVENOUS

## 2017-10-31 NOTE — Progress Notes (Addendum)
Sodium Bicarbonate Infusion for CIN prevention:  Ordered sodium bicarbonate 220 mL/hr (3 mL/kg/hr) x 1 hour prior to cath followed by 75 mL/hr (1 mL/kg/hr) per order set.   Signed and held to be released prior to procedure.  Discontinued furosemide order for tomorrow morning per protocol. Pharmacy to follow about resuming post-cath.  Lenis Noon, PharmD 10/31/17 4:21 PM

## 2017-10-31 NOTE — Progress Notes (Signed)
[place], [place] 10/31/17  Subjective:   Patient known to our practice from outpatient follow-up.  He was seen by Dr. Holley Raring in September 2018.  He did not return for follow-up His baseline creatinine at that time was 1.66/GFR 38/44 Admission creatinine this time is 2.63 Today's creatinine is 2.53, GFR 22/26 Patient presented for evaluation of dyspnea on exertion with lower extremity edema.  His cardiologist recommended coronary angiography.  Nephrology consult is now requested for evaluation prior to IV contrast exposure  Patient's main concern this admission is dry cough and progressively worsening dyspnea on exertion.  He also has worsening lower extremity edema for the past 2 weeks.  He is not following a low-salt diet at home.  He mostly eats out.  Objective:  Vital signs in last 24 hours:  Temp:  [97.7 F (36.5 C)-98.3 F (36.8 C)] 97.7 F (36.5 C) (03/21 0751) Pulse Rate:  [48-97] 94 (03/21 0751) Resp:  [18-37] 18 (03/21 0751) BP: (127-143)/(63-88) 127/74 (03/21 0751) SpO2:  [99 %-100 %] 100 % (03/21 0751) Weight:  [72.3 kg (159 lb 6.4 oz)-75.8 kg (167 lb)] 72.4 kg (159 lb 11.2 oz) (03/21 0442)  Weight change:  Filed Weights   10/30/17 1919 10/30/17 2248 10/31/17 0442  Weight: 75.8 kg (167 lb) 72.3 kg (159 lb 6.4 oz) 72.4 kg (159 lb 11.2 oz)    Intake/Output:    Intake/Output Summary (Last 24 hours) at 10/31/2017 1538 Last data filed at 10/31/2017 1009 Gross per 24 hour  Intake 360 ml  Output 700 ml  Net -340 ml     Physical Exam: General:  No acute distress, laying in the bed  HEENT  anicteric, moist oral mucous membranes  Neck  supple  Pulm/lungs  bilateral diffuse crackles, room air  CVS/Heart  regular with ectopic beats, soft systolic murmur  Abdomen:   Soft, nontender  Extremities:  2+ pitting edema bilaterally  Neurologic:  Alert, oriented  Skin:  No acute rashes          Basic Metabolic Panel:  Recent Labs  Lab  10/30/17 1921 10/31/17 0131  NA 137 135  K 5.3* 5.3*  CL 111 109  CO2 15* 13*  GLUCOSE 137* 110*  BUN 49* 47*  CREATININE 2.63* 2.53*  CALCIUM 9.1 8.8*     CBC: Recent Labs  Lab 10/30/17 1921 10/31/17 0131  WBC 10.2 11.5*  HGB 10.4* 9.6*  HCT 33.1* 30.7*  MCV 74.7* 75.0*  PLT 290 270     No results found for: HEPBSAG, HEPBSAB, HEPBIGM    Microbiology:  No results found for this or any previous visit (from the past 240 hour(s)).  Coagulation Studies: No results for input(s): LABPROT, INR in the last 72 hours.  Urinalysis: No results for input(s): COLORURINE, LABSPEC, PHURINE, GLUCOSEU, HGBUR, BILIRUBINUR, KETONESUR, PROTEINUR, UROBILINOGEN, NITRITE, LEUKOCYTESUR in the last 72 hours.  Invalid input(s): APPERANCEUR    Imaging: Dg Chest 2 View  Result Date: 10/30/2017 CLINICAL DATA:  Cough chills and congestion EXAM: CHEST - 2 VIEW COMPARISON:  07/26/2017, CT chest 09/24/2017 FINDINGS: Trace pleural effusions. No focal consolidation. Enlarged cardiomediastinal silhouette with aortic atherosclerosis. Partially calcified right middle lobe lung nodule as noted on prior CT. IMPRESSION: 1. Trace pleural effusions.  No focal pulmonary infiltrate. 2. Mild cardiomegaly Electronically Signed   By: Donavan Foil M.D.   On: 10/30/2017 20:00     Medications:   . sodium chloride    . [START ON 11/01/2017] sodium chloride  Followed by  . [START ON 11/01/2017] sodium chloride     . [START ON 11/01/2017] aspirin  81 mg Oral Pre-Cath  . aspirin  325 mg Oral Daily  . atorvastatin  40 mg Oral Daily  . finasteride  5 mg Oral Daily  . furosemide  40 mg Oral Daily  . heparin  5,000 Units Subcutaneous Q8H  . loratadine  10 mg Oral Daily  . pantoprazole  40 mg Oral BID  . sodium chloride flush  3 mL Intravenous Q12H   sodium chloride, acetaminophen **OR** acetaminophen, guaiFENesin-dextromethorphan, ondansetron **OR** ondansetron (ZOFRAN) IV, sodium chloride  flush  Assessment/ Plan:  84 y.o. male with chronic kidney disease stage III, hypertension, carpal tunnel syndrome, mitral regurgitation, cardiomyopathy with LVEF 35%, gout, hyperlipidemia, osteoarthritis, prostate cancer  1.  Acute on chronic kidney disease stage III versus progression to stage IV CKD Underlying CKD is likely secondary to atherosclerosis and use of nonsteroidals 2.  Shortness of breath, likely pulmonary edema however differential includes unstable angina as troponin is elevated  3.  Lower extremity edema 4.  Multiple renal cysts, largest on the right kidney. MRI in 05/2017 = multiple simple cysts, largest 7.4 cm on right kidney  Patient appears to have fluid overload and CHF exacerbation.  His shortness of breath and cough may be related to fluid overload.  However, he may have concurrent non-STEMI.   His chest x-ray from yesterday is negative for pulmonary edema, trace pleural effusions and shows mild cardiomegaly.  However, patient does appear to have pulmonary edema by exam.  Plan: Repeat PA lateral chest x-ray Trial of IV Lasix x1 We had a long discussion about risks and benefits of proceeding with cardiac cath and IV contrast exposure.  Best case scenario would be angioplasty resulting in resolution of symptoms.  Worse case scenario would be acute kidney injury leading to chronic hemodialysis.  Patient understands these scenarios and is willing to proceed with cardiac catheterization.  He will be given IV fluids according to protocol although the rate should likely be reduced because of his underlying CHF and current fluid overload. Continue to evaluate clinical situation and reassess in the morning.      LOS: 0 Megha Agnes Candiss Norse 3/21/20193:38 PM  [place], [place]  Note: This note was prepared with Dragon dictation. Any transcription errors are unintentional

## 2017-10-31 NOTE — Consult Note (Signed)
Reason for Consult: Shortness of breath mitral valvular disease elevated troponins Referring Physician: Dr. Archie Balboa ER, Dr Lance Coon hospitalist Cardiologist Dr. Deneen [name] is an 84 y.o. male.  HPI: Patient has a history of multiple medical problems hypertension hyperlipidemia prostate cancer GERD mitral valvular disease with severe mitral regurgitation.  Patient has developed a cardiomyopathy ejection fraction around 35% probably related to the mitral valvular disease.  Patient complains of shortness of breath especially with exertion dyspnea with leg edema denies any chest pain.  Patient has been seen and evaluated by Dr. Ubaldo Glassing recommended for cardiac catheter the patient is reluctant.  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  He states to still be working regularly but knows limited because of his shortness of breath denies any palpitation tachycardia.  Past Medical History:  Diagnosis Date  . Arteriosclerosis of coronary artery 01/27/2014  . Benign essential HTN 12/11/2013  . Carpal tunnel syndrome   . Coronary atherosclerosis of native coronary artery   . Disorder of mitral valve 12/11/2013  . Encounter for long-term (current) use of antiplatelets/antithrombotics   . Gout   . Hyperlipidemia   . Hypertension   . Lung nodule    found on xray in Aug 2018  . Mitral valve disorder   . Osteoarthritis   . Prostate cancer [facility])     Past Surgical History:  Procedure Laterality Date  . COLONOSCOPY    . COLONOSCOPY WITH PROPOFOL N/A 07/23/2016   Procedure: COLONOSCOPY WITH PROPOFOL;  Surgeon: Manya Silvas, MD;  Location: [facility] ENDOSCOPY;  Service: Endoscopy;  Laterality: N/A;  . CORONARY ANGIOPLASTY WITH STENT PLACEMENT    . ESOPHAGOGASTRODUODENOSCOPY Left 07/28/2017   Procedure: ESOPHAGOGASTRODUODENOSCOPY (EGD);  Surgeon: Virgel Manifold, MD;  Location: [facility] ENDOSCOPY;  Service: Endoscopy;  Laterality: Left;  . ESOPHAGOGASTRODUODENOSCOPY (EGD) WITH PROPOFOL N/A  07/23/2016   Procedure: ESOPHAGOGASTRODUODENOSCOPY (EGD) WITH PROPOFOL;  Surgeon: Manya Silvas, MD;  Location: [facility] ENDOSCOPY;  Service: Endoscopy;  Laterality: N/A;  . PROSTATE BIOPSY      Family History  Problem Relation Age of Onset  . Cancer Mother        stomach and uterus  . Cancer Sister        breast    Social History:  reports that he has never smoked. He has never used smokeless tobacco. He reports that he does not drink alcohol or use drugs.  Allergies: No Known Allergies  Medications: I have reviewed the patient's current medications.  Results for orders placed or performed during the hospital encounter of 10/30/17 (from the past 48 hour(s))  Basic metabolic panel     Status: Abnormal   Collection Time: 10/30/17  7:21 PM  Result Value Ref Range   Sodium 137 135 - 145 mmol/L   Potassium 5.3 (H) 3.5 - 5.1 mmol/L   Chloride 111 101 - 111 mmol/L   CO2 15 (L) 22 - 32 mmol/L   Glucose, Bld 137 (H) 65 - 99 mg/dL   BUN 49 (H) 6 - 20 mg/dL   Creatinine, Ser 2.63 (H) 0.61 - 1.24 mg/dL   Calcium 9.1 8.9 - 10.3 mg/dL   GFR calc non Af Amer 21 (L) >60 mL/min   GFR calc Af Amer 24 (L) >60 mL/min    Comment: (NOTE) The eGFR has been calculated using the CKD EPI equation. This calculation has not been validated in all clinical situations. eGFR's persistently <60 mL/min signify possible Chronic Kidney Disease.    Anion gap 11  5 - 15    Comment: Performed at [facility], [address] [place], [place] [address]  CBC     Status: Abnormal   Collection Time: 10/30/17  7:21 PM  Result Value Ref Range   WBC 10.2 3.8 - 10.6 K/uL   RBC 4.43 4.40 - 5.90 MIL/uL   Hemoglobin 10.4 (L) 13.0 - 18.0 g/dL   HCT 33.1 (L) 40.0 - 52.0 %   MCV 74.7 (L) 80.0 - 100.0 fL   MCH 23.4 (L) 26.0 - 34.0 pg   MCHC 31.3 (L) 32.0 - 36.0 g/dL   RDW 19.7 (H) 11.5 - 14.5 %   Platelets 290 150 - 440 K/uL    Comment: Performed at [facility], [address] [place],  [place] [address]  Troponin I     Status: Abnormal   Collection Time: 10/30/17  7:21 PM  Result Value Ref Range   Troponin I 0.44 (HH) <0.03 ng/mL    Comment: CRITICAL RESULT CALLED TO, READ BACK BY AND VERIFIED WITH Charolette Child RN AT 2015 10/30/17. MSS Performed at [facility], [address] [place], [place] [address]   Influenza panel by PCR (type A & B)     Status: None   Collection Time: 10/30/17 10:25 PM  Result Value Ref Range   Influenza A By PCR NEGATIVE NEGATIVE   Influenza B By PCR NEGATIVE NEGATIVE    Comment: (NOTE) The Xpert Xpress Flu assay is intended as an aid in the diagnosis of  influenza and should not be used as a sole basis for treatment.  This  assay is FDA approved for nasopharyngeal swab specimens only. Nasal  washings and aspirates are unacceptable for Xpert Xpress Flu testing. Performed at [facility], [address] [place], [place] [address]   Basic metabolic panel     Status: Abnormal   Collection Time: 10/31/17  1:31 AM  Result Value Ref Range   Sodium 135 135 - 145 mmol/L   Potassium 5.3 (H) 3.5 - 5.1 mmol/L   Chloride 109 101 - 111 mmol/L   CO2 13 (L) 22 - 32 mmol/L   Glucose, Bld 110 (H) 65 - 99 mg/dL   BUN 47 (H) 6 - 20 mg/dL   Creatinine, Ser 2.53 (H) 0.61 - 1.24 mg/dL   Calcium 8.8 (L) 8.9 - 10.3 mg/dL   GFR calc non Af Amer 22 (L) >60 mL/min   GFR calc Af Amer 26 (L) >60 mL/min    Comment: (NOTE) The eGFR has been calculated using the CKD EPI equation. This calculation has not been validated in all clinical situations. eGFR's persistently <60 mL/min signify possible Chronic Kidney Disease.    Anion gap 13 5 - 15    Comment: Performed at [facility], [address] [place], [place] [address]  CBC     Status: Abnormal   Collection Time: 10/31/17  1:31 AM  Result Value Ref Range   WBC 11.5 (H) 3.8 - 10.6 K/uL   RBC 4.09 (L) 4.40 - 5.90 MIL/uL   Hemoglobin 9.6 (L) 13.0 - 18.0 g/dL   HCT 30.7 (L) 40.0 -  52.0 %   MCV 75.0 (L) 80.0 - 100.0 fL   MCH 23.3 (L) 26.0 - 34.0 pg   MCHC 31.1 (L) 32.0 - 36.0 g/dL   RDW 19.8 (H) 11.5 - 14.5 %   Platelets 270 150 - 440 K/uL    Comment: Performed at [facility], [address] [place], [place] [address]  Troponin I     Status: Abnormal   Collection Time: 10/31/17  1:31 AM  Result Value Ref Range   Troponin I 0.47 (HH) <0.03 ng/mL    Comment: CRITICAL VALUE NOTED. VALUE IS CONSISTENT WITH PREVIOUSLY REPORTED/CALLED VALUE. JAG Performed at [facility], [address] [place], [place] [address]   Troponin I     Status: Abnormal   Collection Time: 10/31/17 11:01 AM  Result Value Ref Range   Troponin I 0.46 (HH) <0.03 ng/mL    Comment: CRITICAL VALUE NOTED. VALUE IS CONSISTENT WITH PREVIOUSLY REPORTED/CALLED VALUE KLW Performed at [facility], [address] [place], [place] [address]     Dg Chest 2 View  Result Date: 10/30/2017 CLINICAL DATA:  Cough chills and congestion EXAM: CHEST - 2 VIEW COMPARISON:  07/26/2017, CT chest 09/24/2017 FINDINGS: Trace pleural effusions. No focal consolidation. Enlarged cardiomediastinal silhouette with aortic atherosclerosis. Partially calcified right middle lobe lung nodule as noted on prior CT. IMPRESSION: 1. Trace pleural effusions.  No focal pulmonary infiltrate. 2. Mild cardiomegaly Electronically Signed   By: Donavan Foil M.D.   On: 10/30/2017 20:00    Review of Systems  Constitutional: Positive for diaphoresis and malaise/fatigue.  HENT: Positive for congestion.   Eyes: Negative.   Respiratory: Positive for cough and shortness of breath.   Cardiovascular: Positive for orthopnea and leg swelling.  Gastrointestinal: Negative.   Genitourinary: Negative.   Musculoskeletal: Negative.   Skin: Negative.   Neurological: Negative.   Endo/Heme/Allergies: Negative.   Psychiatric/Behavioral: Negative.    Blood pressure 127/74, pulse 94, temperature 97.7 F (36.5 C),  temperature source Oral, resp. rate 18, height _0  (1.753 m), weight 159 lb 11.2 oz (72.4 kg), SpO2 100 %. Physical Exam  Nursing note and vitals reviewed. Constitutional: He is oriented to person, place, and time. He appears well-developed and well-nourished.  HENT:  Head: Normocephalic and atraumatic.  Eyes: Pupils are equal, round, and reactive to light. Conjunctivae and EOM are normal.  Neck: Normal range of motion. Neck supple. JVD present.  Cardiovascular: Normal rate and regular rhythm. Exam reveals gallop.  Murmur heard. Respiratory: Effort normal and breath sounds normal.  GI: Soft. Bowel sounds are normal.  Musculoskeletal: Normal range of motion.  Neurological: He is alert and oriented to person, place, and time. He has normal reflexes.  Skin: Skin is warm and dry.  Psychiatric: He has a normal mood and affect.    Assessment/Plan: Shortness of breath Congestion Severe mitral regurgitation Hypertension Edema lower extremities Elevated troponin Hyperlipidemia GERD . Plan Agree with admission to telemetry Follow-up cardiac enzymes Follow-up EKGs Diuretic therapy for shortness of breath Continue Lipitor for lipid management Maintain omeprazole for reflux symptoms Recommend cardiac cath for evaluation of mitral valve disease and heart failure  Dwayne D Callwood 10/31/2017, 12:46 PM

## 2017-10-31 NOTE — Progress Notes (Signed)
Pt complains of cough. RN orders standing orders for robitussin DM. I will continue to assess.

## 2017-10-31 NOTE — Progress Notes (Signed)
[place] [facility] [place] NAME: Andrew [name]    MR#:  [id number]  DATE OF BIRTH:  07/19/1933  SUBJECTIVE:  Complains of dry cough and congestion.  Denies chest pain or shortness of breath.  REVIEW OF SYSTEMS:   Review of Systems  Constitutional: Negative for chills, fever and weight loss.  HENT: Positive for congestion. Negative for ear discharge, ear pain and nosebleeds.   Eyes: Negative for blurred vision, pain and discharge.  Respiratory: Positive for cough. Negative for sputum production, shortness of breath, wheezing and stridor.   Cardiovascular: Negative for chest pain, palpitations, orthopnea and PND.  Gastrointestinal: Negative for abdominal pain, diarrhea, nausea and vomiting.  Genitourinary: Negative for frequency and urgency.  Musculoskeletal: Negative for back pain and joint pain.  Neurological: Negative for sensory change, speech change, focal weakness and weakness.  Psychiatric/Behavioral: Negative for depression and hallucinations. The patient is not nervous/anxious.    Tolerating Diet: Yes Tolerating PT: Ambulating  DRUG ALLERGIES:  No Known Allergies  VITALS:  Blood pressure 127/74, pulse 94, temperature 97.7 F (36.5 C), temperature source Oral, resp. rate 18, height 5\' 9"  (1.753 m), weight 72.4 kg (159 lb 11.2 oz), SpO2 100 %.  PHYSICAL EXAMINATION:   Physical Exam  GENERAL:  84 y.o.-year-old patient lying in the bed with no acute distress.  EYES: Pupils equal, round, reactive to light and accommodation. No scleral icterus. Extraocular muscles intact.  HEENT: Head atraumatic, normocephalic. Oropharynx and nasopharynx clear.  NECK:  Supple, no jugular venous distention. No thyroid enlargement, no tenderness.  LUNGS: Normal breath sounds bilaterally, no wheezing, rales, rhonchi. No use of accessory muscles of respiration.  CARDIOVASCULAR: S1, S2 normal. No murmurs, rubs, or gallops.  ABDOMEN: Soft, nontender,  nondistended. Bowel sounds present. No organomegaly or mass.  EXTREMITIES: No cyanosis, clubbing or edema b/l.    NEUROLOGIC: Cranial nerves II through XII are intact. No focal Motor or sensory deficits b/l.   PSYCHIATRIC:  patient is alert and oriented x 3.  SKIN: No obvious rash, lesion, or ulcer.   LABORATORY PANEL:  CBC Recent Labs  Lab 10/31/17 0131  WBC 11.5*  HGB 9.6*  HCT 30.7*  PLT 270    Chemistries  Recent Labs  Lab 10/31/17 0131  NA 135  K 5.3*  CL 109  CO2 13*  GLUCOSE 110*  BUN 47*  CREATININE 2.53*  CALCIUM 8.8*   Cardiac Enzymes Recent Labs  Lab 10/31/17 1101  TROPONINI 0.46*   RADIOLOGY:  Dg Chest 2 View  Result Date: 10/30/2017 CLINICAL DATA:  Cough chills and congestion EXAM: CHEST - 2 VIEW COMPARISON:  07/26/2017, CT chest 09/24/2017 FINDINGS: Trace pleural effusions. No focal consolidation. Enlarged cardiomediastinal silhouette with aortic atherosclerosis. Partially calcified right middle lobe lung nodule as noted on prior CT. IMPRESSION: 1. Trace pleural effusions.  No focal pulmonary infiltrate. 2. Mild cardiomegaly Electronically Signed   By: Donavan Foil M.D.   On: 10/30/2017 20:00   ASSESSMENT AND PLAN:   Andrew [name]  is a 84 y.o. male who presents with 2 to 3 days of cough, malaise.  Patient states that he is not been coughing up any sputum, and he denies any chest pain.  He has had some mild shortness of breath.  He denies any outright fevers, but he has had some episodes of chills.  He says that he had some nausea with couple episodes of vomiting.  He states that his symptoms feel "like the flu".  1.Elevated troponin -etiology at this time.  This is possibly some demand ischemia due to his heart failure under the stress of possible viral infection.  - Influenza PCR is negative - cardiac enzymes 0.44--0.47--- 0.46 - cardiology consult appreciated.  Recommending cardiac catheterization. - Echo showed EF of 30-35%.  2.  Arteriosclerosis  of coronary artery -continue home meds    3.  Acute renal failure superimposed on chronic kidney disease ([place]) -this is likely some cardiorenal syndrome, versus potentially some prerenal injury due to his vomiting and likely viral infection.  Will monitor closely and avoid nephrotoxins. -Baseline creatinine around 1.9-2 -Today 2.3  4.  Benign essential HTN -continue home meds   5. Chronic systolic CHF (congestive heart failure) ([place]) -continue home medications    6.HLD (hyperlipidemia) -home dose statin    7.GERD (gastroesophageal reflux disease) -home dose PPI   Case discussed with Care Management/Social Worker. Management plans discussed with the patient, family and they are in agreement.  CODE STATUS: Full  DVT Prophylaxis: Heparin  TOTAL TIME TAKING CARE OF THIS PATIENT: *30* minutes.  >50% time spent on counselling and coordination of care  POSSIBLE D/C IN 1-2 DAYS, DEPENDING ON CLINICAL CONDITION.  Note: This dictation was prepared with Dragon dictation along with smaller phrase technology. Any transcriptional errors that result from this process are unintentional.  [name] M.D on 10/31/2017 at 2:18 PM  Between 7am to 6pm - Pager - [phone]  After 6pm go to [email or web address] - password [email or web address] [facility]  Office  [phone]  CC: Primary care physician; [name], MDPatient ID: Andrew [name], male   DOB: 22-Oct-1932, 84 y.o.   MRN: [id number]

## 2017-10-31 NOTE — Plan of Care (Signed)
  Problem: Activity: Goal: Risk for activity intolerance will decrease Outcome: Progressing   Problem: Pain Managment: Goal: General experience of comfort will improve Outcome: Progressing   Problem: Safety: Goal: Ability to remain free from injury will improve Outcome: Progressing   

## 2017-10-31 NOTE — Care Management Obs Status (Signed)
[facility] NOTIFICATION   Patient Details  Name: AUNDRAY CARTLIDGE MRN: [id number] Date of Birth: June 16, 1933   Medicare Observation Status Notification Given:  Yes    Katrina Stack, RN 10/31/2017, 8:05 AM

## 2017-10-31 NOTE — Progress Notes (Signed)
Initial Nutrition Assessment  DOCUMENTATION CODES:   Not applicable  INTERVENTION:   Magic cup TID with meals, each supplement provides 290 kcal and 9 grams of protein  Vital Cuisine BID, each supplement provides 520kcal and 22g of protein.   Liberalize diet   NUTRITION DIAGNOSIS:   Inadequate oral intake related to acute illness as evidenced by meal completion < 25%.  GOAL:   Patient will meet greater than or equal to 90% of their needs  MONITOR:   PO intake, Supplement acceptance, Weight trends, Labs, I & O's  REASON FOR ASSESSMENT:   Malnutrition Screening Tool    ASSESSMENT:    84 y.o. male with h/o prostate malignancy and CHF who presents with 2 to 3 days of cough, malaise.   Met with pt in room today. RD familiar with this pt from previous admits. Pt reports poor appetite and oral intake for several days pta. Pt ate only bites of his breakfast this morning. Pt does not like supplements. RD will order Magic Cups and Vital Cuisine with meals. Per chart, pt appears to be weight stable. RD will also liberalize diet.   Medications reviewed and include: aspirin, lasix, heparin, protonix  Labs reviewed: K 5.3(H), BUN 47(H), creat 2.53(H), Ca 8.8(L) Wbc- 11.5(H), Hgb 9.6(L), Hct 30.7(L)  NUTRITION - FOCUSED PHYSICAL EXAM:    Most Recent Value  Orbital Region  No depletion  Upper Arm Region  No depletion  Thoracic and Lumbar Region  No depletion  Buccal Region  No depletion  Temple Region  Mild depletion  Clavicle Bone Region  Moderate depletion  Clavicle and Acromion Bone Region  Moderate depletion  Scapular Bone Region  Mild depletion  Dorsal Hand  No depletion  Patellar Region  Mild depletion  Anterior Thigh Region  No depletion  Posterior Calf Region  No depletion  Edema (RD Assessment)  Moderate  Hair  Reviewed  Eyes  Reviewed  Mouth  Reviewed  Skin  Reviewed  Nails  Reviewed      Diet Order:  Diet Heart Room service appropriate? Yes; Fluid  consistency: Thin  EDUCATION NEEDS:   No education needs have been identified at this time  Skin: Reviewed RN Assessment  Last BM:  3/20   Height:   Ht Readings from Last 1 Encounters:  10/30/17 5' 9"  (1.753 m)    Weight:   Wt Readings from Last 1 Encounters:  10/31/17 159 lb 11.2 oz (72.4 kg)    Ideal Body Weight:  72.7 kg  BMI:  Body mass index is 23.58 kg/m.  Estimated Nutritional Needs:   Kcal:  1700-2000kcal/day   Protein:  72-86g/day   Fluid:  >1.8L/day   Koleen Distance MS, RD, LDN Pager #[phone] After Hours Pager: [phone]

## 2017-11-01 LAB — BASIC METABOLIC PANEL
ANION GAP: 11 (ref 5–15)
BUN: 50 mg/dL — ABNORMAL HIGH (ref 6–20)
CO2: 16 mmol/L — ABNORMAL LOW (ref 22–32)
Calcium: 8.1 mg/dL — ABNORMAL LOW (ref 8.9–10.3)
Chloride: 108 mmol/L (ref 101–111)
Creatinine, Ser: 2.77 mg/dL — ABNORMAL HIGH (ref 0.61–1.24)
GFR, EST AFRICAN AMERICAN: 23 mL/min — AB (ref >60)
GFR, EST NON AFRICAN AMERICAN: 20 mL/min — AB (ref >60)
Glucose, Bld: 91 mg/dL (ref 65–99)
POTASSIUM: 4.1 mmol/L (ref 3.5–5.1)
SODIUM: 135 mmol/L (ref 135–145)

## 2017-11-01 NOTE — Progress Notes (Signed)
[facility] [place] NAME: Andrew [name]    MR#:  [id number]  DATE OF BIRTH:  08-Feb-1933  SUBJECTIVE:  Complains of dry cough and congestion.  SOB on exertion  REVIEW OF SYSTEMS:   Review of Systems  Constitutional: Negative for chills, fever and weight loss.  HENT: Negative for ear discharge, ear pain and nosebleeds.   Eyes: Negative for blurred vision, pain and discharge.  Respiratory: Positive for cough and shortness of breath. Negative for sputum production, wheezing and stridor.   Cardiovascular: Negative for chest pain, palpitations, orthopnea and PND.  Gastrointestinal: Negative for abdominal pain, diarrhea, nausea and vomiting.  Genitourinary: Negative for frequency and urgency.  Musculoskeletal: Negative for back pain and joint pain.  Neurological: Negative for sensory change, speech change, focal weakness and weakness.  Psychiatric/Behavioral: Negative for depression and hallucinations. The patient is not nervous/anxious.    Tolerating Diet: Yes Tolerating PT: Ambulating  DRUG ALLERGIES:  No Known Allergies  VITALS:  Blood pressure (!) 126/55, pulse 92, temperature 97.7 F (36.5 C), temperature source Oral, resp. rate 16, height 5\' 9"  (1.753 m), weight 72.4 kg (159 lb 9.6 oz), SpO2 99 %.  PHYSICAL EXAMINATION:   Physical Exam  GENERAL:  84 y.o.-year-old patient lying in the bed with no acute distress.  EYES: Pupils equal, round, reactive to light and accommodation. No scleral icterus. Extraocular muscles intact.  HEENT: Head atraumatic, normocephalic. Oropharynx and nasopharynx clear.  NECK:  Supple, no jugular venous distention. No thyroid enlargement, no tenderness.  LUNGS:decreased breath sounds bilaterally, no wheezing, few bibasilar rales, rhonchi. No use of accessory muscles of respiration.  CARDIOVASCULAR: S1, S2 normal. No murmurs, rubs, or gallops.  ABDOMEN: Soft, nontender, nondistended. Bowel sounds  present. No organomegaly or mass.  EXTREMITIES: No cyanosis, clubbing  -++edema b/l.    NEUROLOGIC: Cranial nerves II through XII are intact. No focal Motor or sensory deficits b/l.   PSYCHIATRIC:  patient is alert and oriented x 3.  SKIN: No obvious rash, lesion, or ulcer.   LABORATORY PANEL:  CBC Recent Labs  Lab 10/31/17 0131  WBC 11.5*  HGB 9.6*  HCT 30.7*  PLT 270    Chemistries  Recent Labs  Lab 11/01/17 0544  NA 135  K 4.1  CL 108  CO2 16*  GLUCOSE 91  BUN 50*  CREATININE 2.77*  CALCIUM 8.1*   Cardiac Enzymes Recent Labs  Lab 10/31/17 1101  TROPONINI 0.46*   RADIOLOGY:  Dg Chest 2 View  Result Date: 10/31/2017 CLINICAL DATA:  Increasing dyspnea. EXAM: CHEST - 2 VIEW COMPARISON:  10/30/2017 CXR, chest CT 09/24/2017 FINDINGS: Stable cardiomegaly with aortic atherosclerosis. There is slight uncoiling of the thoracic aorta without aneurysmal dilatation. Slight increase in small right pleural effusion with trace left pleural effusion now noted as well. Previously noted right basilar calcified nodular densities obscured by atelectasis. No acute osseous appearing abnormality. Osteoarthritis of the [facility] and glenohumeral joints bilaterally. IMPRESSION: Stable cardiomegaly with mild pulmonary vascular congestion. Slight increase in previously noted small right pleural effusion with development of trace left effusion. Electronically Signed   By: Ashley Royalty M.D.   On: 10/31/2017 17:54   Dg Chest 2 View  Result Date: 10/30/2017 CLINICAL DATA:  Cough chills and congestion EXAM: CHEST - 2 VIEW COMPARISON:  07/26/2017, CT chest 09/24/2017 FINDINGS: Trace pleural effusions. No focal consolidation. Enlarged cardiomediastinal silhouette with aortic atherosclerosis. Partially calcified right middle lobe lung nodule as noted on prior CT. IMPRESSION: 1.  Trace pleural effusions.  No focal pulmonary infiltrate. 2. Mild cardiomegaly Electronically Signed   By: Donavan Foil M.D.   On:  10/30/2017 20:00   ASSESSMENT AND PLAN:   Andrew [name]  is a 84 y.o. male who presents with 2 to 3 days of cough, malaise.  Patient states that he is not been coughing up any sputum, and he denies any chest pain.  He has had some mild shortness of breath.  He denies any outright fevers, but he has had some episodes of chills.  He says that he had some nausea with couple episodes of vomiting.  He states that his symptoms feel "like the flu".    1.Elevated troponin -etiology at this time.  This is possibly some demand ischemia due to his heart failure under the stress of possible viral infection.  - Influenza PCR is negative - cardiac enzymes 0.44--0.47--- 0.46 - cardiology consult appreciated.  Holding cath at present due to elevated creat - Echo showed EF of 30-35%.  2.  Arteriosclerosis of coronary artery -continue home meds    3.  Acute renal failure superimposed on chronic kidney disease ([place]) -this is likely some cardiorenal syndrome, versus potentially some prerenal injury due to his vomiting and likely viral infection.  Will monitor closely and avoid nephrotoxins. -Baseline creatinine around 1.9-2 -Today 2.3  4.  Benign essential HTN -continue home meds   5.Acute on Chronic systolic CHF (congestive heart failure) ([place])  -received IV lasix x2 doses--holding lasix due to elevated CE -sats >92% on RA   -lisinopril on hold due to Creat  6.HLD (hyperlipidemia) -home dose statin    7.GERD (gastroesophageal reflux disease) -home dose PPI   Case discussed with Care Management/Social Worker. Management plans discussed with the patient, family and they are in agreement.  CODE STATUS: Full  DVT Prophylaxis: Heparin  TOTAL TIME TAKING CARE OF THIS PATIENT: *30* minutes.  >50% time spent on counselling and coordination of care  POSSIBLE D/C IN 1-2 DAYS, DEPENDING ON CLINICAL CONDITION.  Note: This dictation was prepared with Dragon dictation along with smaller phrase technology. Any  transcriptional errors that result from this process are unintentional.  [name] M.D on 11/01/2017 at 9:35 AM  Between 7am to 6pm - Pager - [phone]  After 6pm go to [email or web address] - password [email or web address] [facility]  Office  [phone]  CC: Primary care physician; [name], MDPatient ID: Campbell Lerner, male   DOB: September 24, 1932, 84 y.o.   MRN: [id number]

## 2017-11-01 NOTE — Progress Notes (Signed)
[place], [place] 11/01/17  Subjective:   Patient known to our practice from outpatient follow-up.  He was seen by Dr. Holley Raring in September 2018.  He did not return for follow-up His baseline creatinine at that time was 1.66/GFR 38/44 Admission creatinine this time is 2.63 Today's creatinine is 2.53, GFR 22/26 Patient presented for evaluation of dyspnea on exertion with lower extremity edema.  His cardiologist recommended coronary angiography.  Nephrology consult is now requested for evaluation prior to IV contrast exposure  Patient is scheduled for cardiac cath today He appears to be be breathing a little better. Still has cough and LE edema  Objective:  Vital signs in last 24 hours:  Temp:  [97.6 F (36.4 C)-98.6 F (37 C)] 97.7 F (36.5 C) (03/22 0748) Pulse Rate:  [48-92] 92 (03/22 0748) Resp:  [16-18] 16 (03/22 0748) BP: (126-131)/(55-66) 126/55 (03/22 0748) SpO2:  [99 %-100 %] 99 % (03/22 0748) Weight:  [72.4 kg (159 lb 9.6 oz)] 72.4 kg (159 lb 9.6 oz) (03/22 0500)  Weight change: -3.357 kg (-7 lb 6.4 oz) Filed Weights   10/30/17 2248 10/31/17 0442 11/01/17 0500  Weight: 72.3 kg (159 lb 6.4 oz) 72.4 kg (159 lb 11.2 oz) 72.4 kg (159 lb 9.6 oz)    Intake/Output:    Intake/Output Summary (Last 24 hours) at 11/01/2017 0900 Last data filed at 11/01/2017 4259 Gross per 24 hour  Intake 595.84 ml  Output 300 ml  Net 295.84 ml     Physical Exam: General:  No acute distress, laying in the bed  HEENT  anicteric, moist oral mucous membranes  Neck  supple  Pulm/lungs  bilateral mild diffuse crackles, room air  CVS/Heart  regular with ectopic beats, soft systolic murmur  Abdomen:   Soft, nontender  Extremities:  2+ pitting edema bilaterally  Neurologic:  Alert, oriented  Skin:  No acute rashes          Basic Metabolic Panel:  Recent Labs  Lab 10/30/17 1921 10/31/17 0131 11/01/17 0544  NA 137 135 135  K 5.3* 5.3* 4.1  CL 111 109  108  CO2 15* 13* 16*  GLUCOSE 137* 110* 91  BUN 49* 47* 50*  CREATININE 2.63* 2.53* 2.77*  CALCIUM 9.1 8.8* 8.1*     CBC: Recent Labs  Lab 10/30/17 1921 10/31/17 0131  WBC 10.2 11.5*  HGB 10.4* 9.6*  HCT 33.1* 30.7*  MCV 74.7* 75.0*  PLT 290 270     No results found for: HEPBSAG, HEPBSAB, HEPBIGM    Microbiology:  No results found for this or any previous visit (from the past 240 hour(s)).  Coagulation Studies: No results for input(s): LABPROT, INR in the last 72 hours.  Urinalysis: No results for input(s): COLORURINE, LABSPEC, PHURINE, GLUCOSEU, HGBUR, BILIRUBINUR, KETONESUR, PROTEINUR, UROBILINOGEN, NITRITE, LEUKOCYTESUR in the last 72 hours.  Invalid input(s): APPERANCEUR    Imaging: Dg Chest 2 View  Result Date: 10/31/2017 CLINICAL DATA:  Increasing dyspnea. EXAM: CHEST - 2 VIEW COMPARISON:  10/30/2017 CXR, chest CT 09/24/2017 FINDINGS: Stable cardiomegaly with aortic atherosclerosis. There is slight uncoiling of the thoracic aorta without aneurysmal dilatation. Slight increase in small right pleural effusion with trace left pleural effusion now noted as well. Previously noted right basilar calcified nodular densities obscured by atelectasis. No acute osseous appearing abnormality. Osteoarthritis of the [facility] and glenohumeral joints bilaterally. IMPRESSION: Stable cardiomegaly with mild pulmonary vascular congestion. Slight increase in previously noted small right pleural effusion with development of trace left effusion.  Electronically Signed   By: Ashley Royalty M.D.   On: 10/31/2017 17:54   Dg Chest 2 View  Result Date: 10/30/2017 CLINICAL DATA:  Cough chills and congestion EXAM: CHEST - 2 VIEW COMPARISON:  07/26/2017, CT chest 09/24/2017 FINDINGS: Trace pleural effusions. No focal consolidation. Enlarged cardiomediastinal silhouette with aortic atherosclerosis. Partially calcified right middle lobe lung nodule as noted on prior CT. IMPRESSION: 1. Trace pleural effusions.   No focal pulmonary infiltrate. 2. Mild cardiomegaly Electronically Signed   By: Donavan Foil M.D.   On: 10/30/2017 20:00     Medications:   . sodium chloride     . aspirin  325 mg Oral Daily  . atorvastatin  40 mg Oral Daily  . finasteride  5 mg Oral Daily  . heparin  5,000 Units Subcutaneous Q8H  . loratadine  10 mg Oral Daily  . pantoprazole  40 mg Oral BID  . sodium chloride flush  3 mL Intravenous Q12H   sodium chloride, acetaminophen **OR** acetaminophen, guaiFENesin-dextromethorphan, ondansetron **OR** ondansetron (ZOFRAN) IV, sodium chloride flush  Assessment/ Plan:  84 y.o. male with chronic kidney disease stage III, hypertension, carpal tunnel syndrome, mitral regurgitation, cardiomyopathy with LVEF 35%, gout, hyperlipidemia, osteoarthritis, prostate cancer  1.  Acute on chronic kidney disease stage III versus progression to stage IV CKD Underlying CKD is likely secondary to atherosclerosis and use of nonsteroidals 2.  Shortness of breath, likely pulmonary edema however differential includes unstable angina as troponin is elevated  3.  Lower extremity edema 4.  Multiple renal cysts, largest on the right kidney. MRI in 05/2017 = multiple simple cysts, largest 7.4 cm on right kidney  Plan: Pre-cath hydration ongoing Will check on renal panel tomorrow Patient did not report a good response to iv lasix yesterday Low salt diet Will follow     LOS: [place] 3/22/20199:00 AM  [place], [place]  Note: This note was prepared with Dragon dictation. Any transcription errors are unintentional

## 2017-11-01 NOTE — Care Management Important Message (Signed)
Important Message  Patient Details  Name: Andrew [name] MRN: [id number] Date of Birth: [birth year]   Medicare Important Message Given:  Yes Signed IM notice given    Katrina Stack, RN 11/01/2017, 5:48 PM

## 2017-11-02 LAB — BASIC METABOLIC PANEL
Anion gap: 12 (ref 5–15)
BUN: 57 mg/dL — ABNORMAL HIGH (ref 6–20)
CALCIUM: 8.3 mg/dL — AB (ref 8.9–10.3)
CHLORIDE: 108 mmol/L (ref 101–111)
CO2: 18 mmol/L — AB (ref 22–32)
Creatinine, Ser: 2.74 mg/dL — ABNORMAL HIGH (ref 0.61–1.24)
GFR calc non Af Amer: 20 mL/min — ABNORMAL LOW (ref >60)
GFR, EST AFRICAN AMERICAN: 23 mL/min — AB (ref >60)
Glucose, Bld: 92 mg/dL (ref 65–99)
Potassium: 4.8 mmol/L (ref 3.5–5.1)
Sodium: 138 mmol/L (ref 135–145)

## 2017-11-02 MED ORDER — LORATADINE 10 MG PO TABS: 10.0000 mg | ORAL_TABLET | Freq: Every day | ORAL | 0 refills | Status: DC

## 2017-11-02 MED ORDER — TORSEMIDE 20 MG PO TABS: 40.0000 mg | ORAL_TABLET | Freq: Every day | ORAL | 0 refills | Status: DC

## 2017-11-02 NOTE — Discharge Summary (Signed)
[place] [place] NAME: Andrew [name]    MR#:  [id number]  DATE OF BIRTH:  1932-09-07  DATE OF ADMISSION:  10/30/2017 ADMITTING PHYSICIAN: Lance Coon, MD  DATE OF DISCHARGE: 11/02/17  PRIMARY CARE PHYSICIAN: [name], MD    ADMISSION DIAGNOSIS:  Cough [R05] Weakness [R53.1] Elevated troponin [R74.8]  DISCHARGE DIAGNOSIS:  Principal Problem:   Elevated troponin Active Problems:   Arteriosclerosis of coronary artery   Benign essential HTN   HLD (hyperlipidemia)   Acute renal failure superimposed on chronic kidney disease (HCC)   GERD (gastroesophageal reflux disease)   Chronic systolic CHF (congestive heart failure) (H[facility])   Unstable angina ([place])   SECONDARY DIAGNOSIS:   Past Medical History:  Diagnosis Date  . Arteriosclerosis of coronary artery 01/27/2014  . Benign essential HTN 12/11/2013  . Carpal tunnel syndrome   . Coronary atherosclerosis of native coronary artery   . Disorder of mitral valve 12/11/2013  . Encounter for long-term (current) use of antiplatelets/antithrombotics   . Gout   . Hyperlipidemia   . Hypertension   . Lung nodule    found on xray in Aug 2018  . Mitral valve disorder   . Osteoarthritis   . Prostate cancer [facility])     HOSPITAL COURSE:  HPI  Andrew [name]  is a 84 y.o. male who presents with 2 to 3 days of cough, malaise.  Patient states that he is not been coughing up any sputum, and he denies any chest pain.  He has had some mild shortness of breath.  He denies any outright fevers, but he has had some episodes of chills.  He says that he had some nausea with couple episodes of vomiting.  He states that his symptoms feel "like the flu".  Here in the ED he was found to have an elevated troponin.  Chart review shows that he recently followed up with his cardiologist and had an echocardiogram done which shows ejection fraction of 35% with significant valvular pathology.  Hospitalist were called  for admission and further evaluation  1.Elevated troponin -etiology at this time. This is possibly some demand ischemia due to his heart failure under the stress of possible viral infection.  -Influenza PCR is negative - cardiac enzymes 0.44--0.47--- 0.46 - cardiology consult appreciated.  Holding cath at present due to elevated creat, outpatient follow-up with cardiology Dr. Ubaldo Glassing for cardiac catheterization once creatinine is better -Echo showed EF of 30-35%.  2.Arteriosclerosis of coronary artery -continue home meds  3.  Acute renal failure superimposed on chronic kidney disease ([place]) -this is likely some cardiorenal syndrome, versus potentially some prerenal injury due to his vomiting and likely viral infection. Will monitor closely and avoid nephrotoxins. -Holding patient's lisinopril and allopurinol -Baseline creatinine around 1.9-2 - creatinine trend during the hospital course 2.3-2.53-2.77-2.74, patient clinically is feeling better and okay to discharge patient home from nephrology standpoint with torsemide 40 mg once daily a.m. and outpatient follow-up with nephrology as recommended  4.Benign essential HTN -continue home meds  5.Acute onChronic systolic CHF (congestive heart failure) (HCC)  -received IV lasix x2 doses--holding lasix due to elevated CE -sats >92% on RA -lisinopril on hold due to Creat -Daily weight monitoring, outpatient follow-up with CHF clinic Outpatient follow-up with cardiology-  6.HLD (hyperlipidemia) -home dose statin  7.GERD (gastroesophageal reflux disease) -home dose PPI  8.  Mild peripheral edema-recommended to  keep his legs elevated and TEDS   DISCHARGE CONDITIONS:  STABLE  CONSULTS OBTAINED:  Treatment Team:  Yolonda Kida, MD Murlean Iba, MD   PROCEDURES NONE  DRUG ALLERGIES:  No Known Allergies  DISCHARGE MEDICATIONS:   Allergies as of 11/02/2017   No Known Allergies     Medication List    STOP  taking these medications   allopurinol 100 MG tablet Commonly known as:  ZYLOPRIM   furosemide 40 MG tablet Commonly known as:  LASIX   lisinopril 5 MG tablet Commonly known as:  PRINIVIL,ZESTRIL   meloxicam 15 MG tablet Commonly known as:  MOBIC     TAKE these medications   aspirin 325 MG tablet Take 325 mg by mouth daily.   atorvastatin 40 MG tablet Commonly known as:  LIPITOR Take 40 mg by mouth daily.   finasteride 5 MG tablet Commonly known as:  PROSCAR Take 5 mg by mouth daily.   loratadine 10 MG tablet Commonly known as:  CLARITIN Take 1 tablet (10 mg total) by mouth daily. Start taking on:  11/03/2017   omeprazole 20 MG capsule Commonly known as:  PRILOSEC Take 1 capsule (20 mg total) by mouth 2 (two) times daily.   torsemide 20 MG tablet Commonly known as:  DEMADEX Take 2 tablets (40 mg total) by mouth daily.        DISCHARGE INSTRUCTIONS:   Follow-up with primary care physician in a week Follow-up with cardiology Dr. Ubaldo Glassing in 10-14 DAYS Follow-up with nephrology Dr. Candiss Norse in a week  DIET:  Cardiac diet  DISCHARGE CONDITION:  Stable  ACTIVITY:  Activity as tolerated  OXYGEN:  Home Oxygen: No.   Oxygen Delivery: room air  DISCHARGE LOCATION:  home   If you experience worsening of your admission symptoms, develop shortness of breath, life threatening emergency, suicidal or homicidal thoughts you must seek medical attention immediately by calling 911 or calling your MD immediately  if symptoms less severe.  You Must read complete instructions/literature along with all the possible adverse reactions/side effects for all the Medicines you take and that have been prescribed to you. Take any new Medicines after you have completely understood and accpet all the possible adverse reactions/side effects.   Please note  You were cared for by a hospitalist during your hospital stay. If you have any questions about your discharge medications or the  care you received while you were in the hospital after you are discharged, you can call the unit and asked to speak with the hospitalist on call if the hospitalist that took care of you is not available. Once you are discharged, your primary care physician will handle any further medical issues. Please note that NO REFILLS for any discharge medications will be authorized once you are discharged, as it is imperative that you return to your primary care physician (or establish a relationship with a primary care physician if you do not have one) for your aftercare needs so that they can reassess your need for medications and monitor your lab values.     Today  Chief Complaint  Patient presents with  . Cough    Patient feels fine.  Wants to go home.  His Lasix is discontinued and patient will be started on torsemide from tomorrow.  Discussed with nephrology.  Patient is agreeable to start taking torsemide from tomorrow ROS:  CONSTITUTIONAL: Denies fevers, chills. Denies any fatigue, weakness.  EYES: Denies blurry vision, double vision, eye pain. EARS, NOSE, THROAT: Denies tinnitus, ear pain, hearing loss. RESPIRATORY: Denies cough, wheeze, shortness of breath.  CARDIOVASCULAR: Denies chest pain, palpitations, edema.  GASTROINTESTINAL: Denies nausea, vomiting, diarrhea, abdominal pain. Denies bright red blood per rectum. GENITOURINARY: Denies dysuria, hematuria. ENDOCRINE: Denies nocturia or thyroid problems. HEMATOLOGIC AND LYMPHATIC: Denies easy bruising or bleeding. SKIN: Denies rash or lesion. MUSCULOSKELETAL: Denies pain in neck, back, shoulder, knees, hips or arthritic symptoms.  NEUROLOGIC: Denies paralysis, paresthesias.  PSYCHIATRIC: Denies anxiety or depressive symptoms.   VITAL SIGNS:  Blood pressure 121/79, pulse 94, temperature 98.5 F (36.9 C), temperature source Oral, resp. rate 18, height 5\' 9"  (1.753 m), weight 72.4 kg (159 lb 9.6 oz), SpO2 100 %.  I/O:    Intake/Output  Summary (Last 24 hours) at 11/02/2017 1249 Last data filed at 11/02/2017 0600 Gross per 24 hour  Intake -  Output 300 ml  Net -300 ml    PHYSICAL EXAMINATION:  GENERAL:  84 y.o.-year-old patient lying in the bed with no acute distress.  EYES: Pupils equal, round, reactive to light and accommodation. No scleral icterus. Extraocular muscles intact.  HEENT: Head atraumatic, normocephalic. Oropharynx and nasopharynx clear.  NECK:  Supple, no jugular venous distention. No thyroid enlargement, no tenderness.  LUNGS: Normal breath sounds bilaterally, no wheezing, rales,rhonchi or crepitation. No use of accessory muscles of respiration.  CARDIOVASCULAR: S1, S2 normal. No murmurs, rubs, or gallops.  ABDOMEN: Soft, non-tender, non-distended. Bowel sounds present. No organomegaly or mass.  EXTREMITIES: No pedal edema, cyanosis, or clubbing.  NEUROLOGIC: Cranial nerves II through XII are intact. Muscle strength 5/5 in all extremities. Sensation intact. Gait not checked.  PSYCHIATRIC: The patient is alert and oriented x 3.  SKIN: No obvious rash, lesion, or ulcer.   DATA REVIEW:   CBC Recent Labs  Lab 10/31/17 0131  WBC 11.5*  HGB 9.6*  HCT 30.7*  PLT 270    Chemistries  Recent Labs  Lab 11/02/17 0549  NA 138  K 4.8  CL 108  CO2 18*  GLUCOSE 92  BUN 57*  CREATININE 2.74*  CALCIUM 8.3*    Cardiac Enzymes Recent Labs  Lab 10/31/17 1101  TROPONINI 0.46*    Microbiology Results  No results found for this or any previous visit.  RADIOLOGY:  Dg Chest 2 View  Result Date: 10/31/2017 CLINICAL DATA:  Increasing dyspnea. EXAM: CHEST - 2 VIEW COMPARISON:  10/30/2017 CXR, chest CT 09/24/2017 FINDINGS: Stable cardiomegaly with aortic atherosclerosis. There is slight uncoiling of the thoracic aorta without aneurysmal dilatation. Slight increase in small right pleural effusion with trace left pleural effusion now noted as well. Previously noted right basilar calcified nodular densities  obscured by atelectasis. No acute osseous appearing abnormality. Osteoarthritis of the [facility] and glenohumeral joints bilaterally. IMPRESSION: Stable cardiomegaly with mild pulmonary vascular congestion. Slight increase in previously noted small right pleural effusion with development of trace left effusion. Electronically Signed   By: Ashley Royalty M.D.   On: 10/31/2017 17:54   Dg Chest 2 View  Result Date: 10/30/2017 CLINICAL DATA:  Cough chills and congestion EXAM: CHEST - 2 VIEW COMPARISON:  07/26/2017, CT chest 09/24/2017 FINDINGS: Trace pleural effusions. No focal consolidation. Enlarged cardiomediastinal silhouette with aortic atherosclerosis. Partially calcified right middle lobe lung nodule as noted on prior CT. IMPRESSION: 1. Trace pleural effusions.  No focal pulmonary infiltrate. 2. Mild cardiomegaly Electronically Signed   By: Donavan Foil M.D.   On: 10/30/2017 20:00    EKG:   Orders placed or performed during the hospital encounter of 10/30/17  . ED EKG within 10 minutes  . ED EKG within  10 minutes      Management plans discussed with the patient, family and they are in agreement.  CODE STATUS:     Code Status Orders  (From admission, onward)        Start     Ordered   10/30/17 2256  Full code  Continuous     10/30/17 2255    Code Status History    Date Active Date Inactive Code Status Order ID Comments User Context   07/26/2017 1728 07/28/2017 1620 Full Code [id number]  Saundra Shelling, MD Inpatient   04/08/2017 1555 04/10/2017 1526 Full Code [id number]  Dustin Flock, MD Inpatient    Advance Directive Documentation     Most Recent Value  Type of Advance Directive  Healthcare Power of Attorney  Pre-existing out of facility DNR order (yellow form or pink MOST form)  -  "MOST" Form in Place?  -      TOTAL TIME TAKING CARE OF THIS PATIENT: 45 minutes.   Note: This dictation was prepared with Dragon dictation along with smaller phrase technology. Any transcriptional  errors that result from this process are unintentional.   @[facility] @  on 11/02/2017 at 12:49 PM  Between 7am to 6pm - Pager - [phone]  After 6pm go to [email or web address] - password [email or web address] [facility]  [facility]  Office  [phone]  CC: Primary care physician; [name], MD

## 2017-11-02 NOTE — Discharge Instructions (Signed)
Torsemide tablets °What is this medicine? °TORSEMIDE (TORE se mide) is a diuretic. It helps you make more urine and to lose salt and excess water from your body. This medicine is used to treat high blood pressure, and edema or swelling from heart, kidney, or liver disease. °This medicine may be used for other purposes; ask your health care provider or pharmacist if you have questions. °COMMON BRAND NAME(S): Demadex °What should I tell my health care provider before I take this medicine? °They need to know if you have any of these conditions: °-abnormal blood electrolytes °-diabetes °-gout °-heart disease °-kidney disease °-liver disease °-small amounts of urine, or difficulty passing urine °-an unusual or allergic reaction to torsemide, sulfa drugs, other medicines, foods, dyes, or preservatives °-pregnant or trying to get pregnant °-breast-feeding °How should I use this medicine? °Take this medicine by mouth with a glass of water. Follow the directions on the prescription label. You may take this medicine with or without food. If it upsets your stomach, take it with food or milk. Do not take your medicine more often than directed. Remember that you will need to pass more urine after taking this medicine. Do not take your medicine at a time of day that will cause you problems. Do not take at bedtime. °Talk to your pediatrician regarding the use of this medicine in children. Special care may be needed. °Overdosage: If you think you have taken too much of this medicine contact a poison control center or emergency room at once. °NOTE: This medicine is only for you. Do not share this medicine with others. °What if I miss a dose? °If you miss a dose, take it as soon as you can. If it is almost time for your next dose, take only that dose. Do not take double or extra doses. °What may interact with this medicine? °-alcohol °-certain antibiotics given by injection °certain heart medicines like  digoxin °-diuretics °-lithium °-medicines for diabetes °-medicines for blood pressure °-medicines for cholesterol like cholestyramine °-medicines that relax muscles for surgery °-NSAIDs, medicines for pain and inflammation, like ibuprofen or naproxen °-OTC supplements like ginseng and ephedra °-probenecid °-steroid medicines like prednisone or cortisone °This list may not describe all possible interactions. Give your health care provider a list of all the medicines, herbs, non-prescription drugs, or dietary supplements you use. Also tell them if you smoke, drink alcohol, or use illegal drugs. Some items may interact with your medicine. °What should I watch for while using this medicine? °Visit your doctor or health care professional for regular checks on your progress. Check your blood pressure regularly. Ask your doctor or health care professional what your blood pressure should be, and when you should contact him or her. If you are a diabetic, check your blood sugar as directed. °You may need to be on a special diet while taking this medicine. Check with your doctor. Also, ask how many glasses of fluid you need to drink a day. You must not get dehydrated. °You may get drowsy or dizzy. Do not drive, use machinery, or do anything that needs mental alertness until you know how this drug affects you. Do not stand or sit up quickly, especially if you are an older patient. This reduces the risk of dizzy or fainting spells. Alcohol can make you more drowsy and dizzy. Avoid alcoholic drinks. °What side effects may I notice from receiving this medicine? °Side effects that you should report to your doctor or health care professional as soon as possible: °-  allergic reactions such as skin rash or itching, hives, swelling of the lips, mouth, tongue or throat -blood in urine or stool -dry mouth -hearing loss or ringing in the ears -irregular heartbeat -muscle pain, weakness or cramps -pain or difficulty passing  urine -unusually weak or tired -vomiting or diarrhea Side effects that usually do not require medical attention (report to your doctor or health care professional if they continue or are bothersome): -dizzy or lightheaded -headache -increased thirst -passing large amounts of urine -sexual difficulties -stomach pain, upset or nausea This list may not describe all possible side effects. Call your doctor for medical advice about side effects. You may report side effects to FDA at 1-800-FDA-1088. Where should I keep my medicine? Keep out of the reach of children. Store at room temperature between 15 and 30 degrees C (59 and 86 degrees F). Throw away any unused medicine after the expiration date. NOTE: This sheet is a summary. It may not cover all possible information. If you have questions about this medicine, talk to your doctor, pharmacist, or health care provider.  2018 Elsevier/Gold Standard (2008-04-15 11:35:45) DASH Eating Plan DASH stands for "Dietary Approaches to Stop Hypertension." The DASH eating plan is a healthy eating plan that has been shown to reduce high blood pressure (hypertension). It may also reduce your risk for type 2 diabetes, heart disease, and stroke. The DASH eating plan may also help with weight loss. What are tips for following this plan? General guidelines  Avoid eating more than 2,300 mg (milligrams) of salt (sodium) a day. If you have hypertension, you may need to reduce your sodium intake to 1,500 mg a day.  Limit alcohol intake to no more than 1 drink a day for nonpregnant women and 2 drinks a day for men. One drink equals 12 oz of beer, 5 oz of wine, or 1 oz of hard liquor.  Work with your health care provider to maintain a healthy body weight or to lose weight. Ask what an ideal weight is for you.  Get at least 30 minutes of exercise that causes your heart to beat faster (aerobic exercise) most days of the week. Activities may include walking, swimming, or  biking.  Work with your health care provider or diet and nutrition specialist (dietitian) to adjust your eating plan to your individual calorie needs. Reading food labels  Check food labels for the amount of sodium per serving. Choose foods with less than 5 percent of the Daily Value of sodium. Generally, foods with less than 300 mg of sodium per serving fit into this eating plan.  To find whole grains, look for the word "whole" as the first word in the ingredient list. Shopping  Buy products labeled as "low-sodium" or "no salt added."  Buy fresh foods. Avoid canned foods and premade or frozen meals. Cooking  Avoid adding salt when cooking. Use salt-free seasonings or herbs instead of table salt or sea salt. Check with your health care provider or pharmacist before using salt substitutes.  Do not fry foods. Cook foods using healthy methods such as baking, boiling, grilling, and broiling instead.  Cook with heart-healthy oils, such as olive, canola, soybean, or sunflower oil. Meal planning   Eat a balanced diet that includes: ? 5 or more servings of fruits and vegetables each day. At each meal, try to fill half of your plate with fruits and vegetables. ? Up to 6-8 servings of whole grains each day. ? Less than 6 oz of lean meat,  poultry, or fish each day. A 3-oz serving of meat is about the same size as a deck of cards. One egg equals 1 oz. ? 2 servings of low-fat dairy each day. ? A serving of nuts, seeds, or beans 5 times each week. ? Heart-healthy fats. Healthy fats called Omega-3 fatty acids are found in foods such as flaxseeds and coldwater fish, like sardines, salmon, and mackerel.  Limit how much you eat of the following: ? Canned or prepackaged foods. ? Food that is high in trans fat, such as fried foods. ? Food that is high in saturated fat, such as fatty meat. ? Sweets, desserts, sugary drinks, and other foods with added sugar. ? Full-fat dairy products.  Do not salt  foods before eating.  Try to eat at least 2 vegetarian meals each week.  Eat more home-cooked food and less restaurant, buffet, and fast food.  When eating at a restaurant, ask that your food be prepared with less salt or no salt, if possible. What foods are recommended? The items listed may not be a complete list. Talk with your dietitian about what dietary choices are best for you. Grains Whole-grain or whole-wheat bread. Whole-grain or whole-wheat pasta. Brown rice. [place]. Bulgur. Whole-grain and low-sodium cereals. Pita bread. Low-fat, low-sodium crackers. Whole-wheat flour tortillas. Vegetables Fresh or frozen vegetables (raw, steamed, roasted, or grilled). Low-sodium or reduced-sodium tomato and vegetable juice. Low-sodium or reduced-sodium tomato sauce and tomato paste. Low-sodium or reduced-sodium canned vegetables. Fruits All fresh, dried, or frozen fruit. Canned fruit in natural juice (without added sugar). Meat and other protein foods Skinless chicken or [place]. Ground chicken or [place]. Pork with fat trimmed off. Fish and seafood. Egg whites. Dried beans, peas, or lentils. Unsalted nuts, nut butters, and seeds. Unsalted canned beans. Lean cuts of beef with fat trimmed off. Low-sodium, lean deli meat. Dairy Low-fat (1%) or fat-free (skim) milk. Fat-free, low-fat, or reduced-fat cheeses. Nonfat, low-sodium ricotta or cottage cheese. Low-fat or nonfat yogurt. Low-fat, low-sodium cheese. Fats and oils Soft margarine without trans fats. Vegetable oil. Low-fat, reduced-fat, or light mayonnaise and salad dressings (reduced-sodium). Canola, safflower, olive, soybean, and sunflower oils. Avocado. Seasoning and other foods Herbs. Spices. Seasoning mixes without salt. Unsalted popcorn and pretzels. Fat-free sweets. What foods are not recommended? The items listed may not be a complete list. Talk with your dietitian about what dietary choices are best for you. Grains Baked goods  made with fat, such as croissants, muffins, or some breads. Dry pasta or rice meal packs. Vegetables Creamed or fried vegetables. Vegetables in a cheese sauce. Regular canned vegetables (not low-sodium or reduced-sodium). Regular canned tomato sauce and paste (not low-sodium or reduced-sodium). Regular tomato and vegetable juice (not low-sodium or reduced-sodium). Angie Fava. Olives. Fruits Canned fruit in a light or heavy syrup. Fried fruit. Fruit in cream or butter sauce. Meat and other protein foods Fatty cuts of meat. Ribs. Fried meat. Berniece Salines. Sausage. Bologna and other processed lunch meats. Salami. Fatback. Hotdogs. Bratwurst. Salted nuts and seeds. Canned beans with added salt. Canned or smoked fish. Whole eggs or egg yolks. Chicken or [place] with skin. Dairy Whole or 2% milk, cream, and half-and-half. Whole or full-fat cream cheese. Whole-fat or sweetened yogurt. Full-fat cheese. Nondairy creamers. Whipped toppings. Processed cheese and cheese spreads. Fats and oils Butter. Stick margarine. Lard. Shortening. Ghee. Bacon fat. Tropical oils, such as coconut, palm kernel, or palm oil. Seasoning and other foods Salted popcorn and pretzels. Onion salt, garlic salt, seasoned salt, table salt, and  sea salt. Worcestershire sauce. Tartar sauce. Barbecue sauce. Teriyaki sauce. Soy sauce, including reduced-sodium. Steak sauce. Canned and packaged gravies. Fish sauce. Oyster sauce. Cocktail sauce. Horseradish that you find on the shelf. Ketchup. Mustard. Meat flavorings and tenderizers. Bouillon cubes. Hot sauce and Tabasco sauce. Premade or packaged marinades. Premade or packaged taco seasonings. Relishes. Regular salad dressings. Where to find more information:  National Heart, Lung, and [place]: https://wilson-eaton.com/  American Heart Association: www.heart.org Summary  The DASH eating plan is a healthy eating plan that has been shown to reduce high blood pressure (hypertension). It may also reduce  your risk for type 2 diabetes, heart disease, and stroke.  With the DASH eating plan, you should limit salt (sodium) intake to 2,300 mg a day. If you have hypertension, you may need to reduce your sodium intake to 1,500 mg a day.  When on the DASH eating plan, aim to eat more fresh fruits and vegetables, whole grains, lean proteins, low-fat dairy, and heart-healthy fats.  Work with your health care provider or diet and nutrition specialist (dietitian) to adjust your eating plan to your individual calorie needs. This information is not intended to replace advice given to you by your health care provider. Make sure you discuss any questions you have with your health care provider. Document Released: 07/19/2011 Document Revised: 07/23/2016 Document Reviewed: 07/23/2016 Elsevier Interactive Patient Education  2018 [place] for Chronic Kidney Disease When your kidneys are not working well, they cannot remove waste and excess substances from your blood as effectively as they did before. This can lead to a buildup and imbalance of these substances, which can worsen kidney damage and affect how your body functions. Certain foods lead to a buildup of these substances in the body. By changing your diet as recommended by your diet and nutrition specialist (dietitian) or health care provider, you could help prevent further kidney damage and delay or prevent the need for dialysis. What are tips for following this plan? General instructions  Work with your health care provider and dietitian to develop a meal plan that is right for you. Foods you can eat, limit, or avoid will be different for each person depending on the stage of kidney disease and any other existing health conditions.  Talk with your health care provider about whether you should take a vitamin and mineral supplement.  Use standard measuring cups and spoons to measure servings of foods. Use a kitchen scale to measure portions  of protein foods.  If directed by your health care provider, avoid drinking too much fluid. Measure and count all liquids, including water, ice, soups, flavored gelatin, and frozen desserts such as popsicles or ice cream. Reading food labels  Check the amount of sodium in foods. Choose foods that have less than 300 milligrams (mg) per serving.  Check the ingredient list for phosphorus or potassium-based additives or preservatives.  Check the amount of saturated and trans fat. Limit or avoid these fats as told by your dietitian. Shopping  Avoid buying foods that are: ? Processed, frozen, or prepackaged. ? Calcium-enriched or fortified.  Do not buy foods that have salt or sodium listed among the first five ingredients.  Do not buy canned vegetables. Cooking  Replace animal proteins, such as meat, fish, eggs, or dairy, with plant proteins from beans, nuts, and soy. ? Use soy milk instead of cow's milk. ? Add beans or tofu to soups, casseroles, or pasta dishes instead of meat.  Soak vegetables, such as  potatoes, before cooking to reduce potassium. To do this: ? Peel and cut into small pieces. ? Soak in warm water for at least 2 hours. For every 1 cup of vegetables, use 10 cups of water. ? Drain and rinse with warm water. ? Boil for at least 5 minutes. Meal planning  Limit the amount of protein from plant and animal sources you eat each day.  Do not add salt to food when cooking or before eating.  Eat meals and snacks at around the same time each day. If you have diabetes:  If you have diabetes (diabetes mellitus) and chronic kidney disease, it is important to keep your blood glucose in the target range recommended by your health care provider. Follow your diabetes management plan. This may include: ? Checking your blood glucose regularly. ? Taking oral medicines, insulin, or both. ? Exercising for at least 30 minutes on 5 or more days each week, or as told by your health care  provider. ? Tracking how many servings of carbohydrates you eat at each meal.  You may be given specific guidelines on how much of certain foods and nutrients you may eat, depending on your stage of kidney disease and whether you have high blood pressure (hypertension). Follow your meal plan as told by your dietitian. What nutrients should be limited? The items listed are not a complete list. Talk with your dietitian about what dietary choices are best for you. Potassium Potassium affects how steadily your heart beats. If too much potassium builds up in your blood, it can cause an irregular heartbeat or even a heart attack. You may need to eat less potassium, depending on your blood potassium levels and the stage of kidney disease. Talk to your dietitian about how much potassium you may have each day. You may need to limit or avoid foods that are high in potassium, such as:  Milk and soy milk.  Fruits, such as bananas, papaya, apricots, nectarines, melon, prunes, raisins, kiwi, and oranges.  Vegetables, such as potatoes, sweet potatoes, yams, tomatoes, leafy greens, beets, okra, avocado, pumpkin, and winter squash.  White and lima beans.  Phosphorus Phosphorus is a mineral found in your bones. A balance between calcium and phosphorous is needed to build and maintain healthy bones. Too much phosphorus pulls calcium from your bones. This can make your bones weak and more likely to break. Too much phosphorus can also make your skin itch. You may need to eat less phosphorus depending on your blood phosphorus levels and the stage of kidney disease. Talk to your dietitian about how much potassium you may have each day. You may need to take medicine to lower your blood phosphorus levels if diet changes do not help. You may need to limit or avoid foods that are high in phosphorus, such as:  Milk and dairy products.  Dried beans and peas.  Tofu, soy milk, and other soy-based meat  replacements.  Colas.  Nuts and peanut butter.  Meat, poultry, and fish.  Bran cereals and oatmeals.  Protein Protein helps you to make and keep muscle. It also helps in the repair of your bodys cells and tissues. One of the natural breakdown products of protein is a waste product called urea. When your kidneys are not working properly, they cannot remove wastes, such as urea, like they did before you developed chronic kidney disease. Reducing how much protein you eat can help prevent a buildup of urea in your blood. Depending on your stage of kidney  disease, you may need to limit foods that are high in protein. Sources of animal protein include:  Meat (all types).  Fish and seafood.  Poultry.  Eggs.  Dairy.  Other protein foods include:  Beans and legumes.  Nuts and nut butter.  Soy and tofu.  Sodium Sodium, which is found in salt, helps maintain a healthy balance of fluids in your body. Too much sodium can increase your blood pressure and have a negative effect on the function of your heart and lungs. Too much sodium can also cause your body to retain too much fluid, making your kidneys work harder. Most people should have less than 2,300 milligrams (mg) of sodium each day. If you have hypertension, you may need to limit your sodium to 1,500 mg each day. Talk to your dietitian about how much sodium you may have each day. You may need to limit or avoid foods that are high in sodium, such as:  Salt seasonings.  Soy sauce.  Cured and processed meats.  Salted crackers and snack foods.  Fast food.  Canned soups and most canned foods.  Pickled foods.  Vegetable juice.  Boxed mixes or ready-to-eat boxed meals and side dishes.  Bottled dressings, sauces, and marinades.  Summary  Chronic kidney disease can lead to a buildup and imbalance of waste and excess substances in the body. Certain foods lead to a buildup of these substances. By adjusting your intake of  these foods, you could help prevent more kidney damage and delay or prevent the need for dialysis.  Food adjustments are different for each person with chronic kidney disease. Work with a dietitian to set up nutrient goals and a meal plan that is right for you.  If you have diabetes and chronic kidney disease, it is important to keep your blood glucose in the target range recommended by your health care provider. This information is not intended to replace advice given to you by your health care provider. Make sure you discuss any questions you have with your health care provider. Document Released: 10/20/2002 Document Revised: 07/25/2016 Document Reviewed: 07/25/2016 Elsevier Interactive Patient Education  2018 [place] with primary care physician in a week Follow-up with cardiology Dr. Ubaldo Glassing in 10-14 DAYS Follow-up with nephrology Dr. Candiss Norse in a week Follow-up with CHF clinic in 3-4 days

## 2017-11-02 NOTE — Progress Notes (Signed)
Andrew [name], [place] 11/02/17  Subjective:   Patient known to our practice from outpatient follow-up.  He was seen by Dr. Holley Raring in September 2018.  He did not return for follow-up His baseline creatinine at that time was 1.66/GFR 44 Admission creatinine this time is 2.63 Today's creatinine is 2.53, GFR 26 Patient presented for evaluation of dyspnea on exertion with lower extremity edema.  His cardiologist recommended coronary angiography.  Nephrology consult is now requested for evaluation prior to IV contrast exposure  Cardiac cath was cancelled due to elevated creatinine He appears to be be breathing a little better. Still has LE edema Admits to eating 4 bags of potato chips last week   Objective:  Vital signs in last 24 hours:  Temp:  [97.8 F (36.6 C)-98.5 F (36.9 C)] 98.5 F (36.9 C) (03/23 0530) Pulse Rate:  [56-117] 94 (03/23 0530) Resp:  [17-20] 18 (03/23 0530) BP: (121-137)/(69-89) 121/79 (03/23 0530) SpO2:  [100 %] 100 % (03/23 0530) Weight:  [159 lb 9.6 oz (72.4 kg)] 159 lb 9.6 oz (72.4 kg) (03/23 0530)  Weight change: 0 lb (0 kg) Filed Weights   10/31/17 0442 11/01/17 0500 11/02/17 0530  Weight: 159 lb 11.2 oz (72.4 kg) 159 lb 9.6 oz (72.4 kg) 159 lb 9.6 oz (72.4 kg)    Intake/Output:    Intake/Output Summary (Last 24 hours) at 11/02/2017 1037 Last data filed at 11/02/2017 0600 Gross per 24 hour  Intake -  Output 300 ml  Net -300 ml     Physical Exam: General:  No acute distress, laying in the bed  HEENT  anicteric, moist oral mucous membranes  Neck  supple  Pulm/lungs  bilateral mild diffuse crackles, room air  CVS/Heart  regular with ectopic beats, soft systolic murmur  Abdomen:   Soft, nontender  Extremities:  2+ pitting edema bilaterally  Neurologic:  Alert, oriented  Skin:  No acute rashes          Basic Metabolic Panel:  Recent Labs  Lab 10/30/17 1921 10/31/17 0131 11/01/17 0544 11/02/17 0549  NA 137 135  135 138  K 5.3* 5.3* 4.1 4.8  CL 111 109 108 108  CO2 15* 13* 16* 18*  GLUCOSE 137* 110* 91 92  BUN 49* 47* 50* 57*  CREATININE 2.63* 2.53* 2.77* 2.74*  CALCIUM 9.1 8.8* 8.1* 8.3*     CBC: Recent Labs  Lab 10/30/17 1921 10/31/17 0131  WBC 10.2 11.5*  HGB 10.4* 9.6*  HCT 33.1* 30.7*  MCV 74.7* 75.0*  PLT 290 270     No results found for: HEPBSAG, HEPBSAB, HEPBIGM    Microbiology:  No results found for this or any previous visit (from the past 240 hour(s)).  Coagulation Studies: No results for input(s): LABPROT, INR in the last 72 hours.  Urinalysis: No results for input(s): COLORURINE, LABSPEC, PHURINE, GLUCOSEU, HGBUR, BILIRUBINUR, KETONESUR, PROTEINUR, UROBILINOGEN, NITRITE, LEUKOCYTESUR in the last 72 hours.  Invalid input(s): APPERANCEUR    Imaging: Dg Chest 2 View  Result Date: 10/31/2017 CLINICAL DATA:  Increasing dyspnea. EXAM: CHEST - 2 VIEW COMPARISON:  10/30/2017 CXR, chest CT 09/24/2017 FINDINGS: Stable cardiomegaly with aortic atherosclerosis. There is slight uncoiling of the thoracic aorta without aneurysmal dilatation. Slight increase in small right pleural effusion with trace left pleural effusion now noted as well. Previously noted right basilar calcified nodular densities obscured by atelectasis. No acute osseous appearing abnormality. Osteoarthritis of the [facility] and glenohumeral joints bilaterally. IMPRESSION: Stable cardiomegaly with mild pulmonary vascular  congestion. Slight increase in previously noted small right pleural effusion with development of trace left effusion. Electronically Signed   By: Ashley Royalty M.D.   On: 10/31/2017 17:54     Medications:    . aspirin  325 mg Oral Daily  . atorvastatin  40 mg Oral Daily  . finasteride  5 mg Oral Daily  . heparin  5,000 Units Subcutaneous Q8H  . loratadine  10 mg Oral Daily  . pantoprazole  40 mg Oral BID   acetaminophen **OR** acetaminophen, guaiFENesin-dextromethorphan, ondansetron **OR**  ondansetron (ZOFRAN) IV  Assessment/ Plan:  84 y.o. male with chronic kidney disease stage III, hypertension, carpal tunnel syndrome, mitral regurgitation, cardiomyopathy with LVEF 35%, gout, hyperlipidemia, osteoarthritis, prostate cancer  1.  Acute on chronic kidney disease stage III versus progression to stage IV CKD Underlying CKD is likely secondary to atherosclerosis and use of nonsteroidals 2.  Shortness of breath, likely pulmonary edema however differential includes unstable angina as troponin is elevated  3.  Lower extremity edema 4.  Multiple renal cysts, largest on the right kidney. MRI in 05/2017 = multiple simple cysts, largest 7.4 cm on right kidney  Plan: Follow strict Low salt diet S creatinine is now stable Change to oral torsemide Will follow as outpatient     LOS: 2 Dariel Pellecchia Candiss Norse 3/23/201910:37 AM  [place], [place]  Note: This note was prepared with Dragon dictation. Any transcription errors are unintentional

## 2017-11-04 SURGERY — RIGHT/LEFT HEART CATH AND CORONARY ANGIOGRAPHY
Anesthesia: Moderate Sedation

## 2017-12-01 ENCOUNTER — Other Ambulatory Visit: Payer: Self-pay

## 2017-12-01 ENCOUNTER — Emergency Department: Payer: Medicare Other

## 2017-12-01 ENCOUNTER — Inpatient Hospital Stay
Admission: EM | Admit: 2017-12-01 | Discharge: 2017-12-17 | DRG: 673 | Disposition: A | Discharge status: Home Health | Payer: Medicare Other | Attending: Internal Medicine | Admitting: Internal Medicine

## 2017-12-01 ENCOUNTER — Inpatient Hospital Stay: Payer: Medicare Other

## 2017-12-01 DIAGNOSIS — I2510 Atherosclerotic heart disease of native coronary artery without angina pectoris: Secondary | ICD-10-CM | POA: Diagnosis present

## 2017-12-01 DIAGNOSIS — Z7982 Long term (current) use of aspirin: Secondary | ICD-10-CM

## 2017-12-01 DIAGNOSIS — I5043 Acute on chronic combined systolic (congestive) and diastolic (congestive) heart failure: Secondary | ICD-10-CM | POA: Diagnosis present

## 2017-12-01 DIAGNOSIS — E785 Hyperlipidemia, unspecified: Secondary | ICD-10-CM | POA: Diagnosis present

## 2017-12-01 DIAGNOSIS — I340 Nonrheumatic mitral (valve) insufficiency: Secondary | ICD-10-CM | POA: Diagnosis present

## 2017-12-01 DIAGNOSIS — R609 Edema, unspecified: Secondary | ICD-10-CM | POA: Diagnosis present

## 2017-12-01 DIAGNOSIS — D631 Anemia in chronic kidney disease: Secondary | ICD-10-CM | POA: Diagnosis present

## 2017-12-01 DIAGNOSIS — I248 Other forms of acute ischemic heart disease: Secondary | ICD-10-CM | POA: Diagnosis not present

## 2017-12-01 DIAGNOSIS — L24A9 Irritant contact dermatitis due friction or contact with other specified body fluids: Secondary | ICD-10-CM

## 2017-12-01 DIAGNOSIS — Z66 Do not resuscitate: Secondary | ICD-10-CM | POA: Diagnosis present

## 2017-12-01 DIAGNOSIS — R52 Pain, unspecified: Secondary | ICD-10-CM

## 2017-12-01 DIAGNOSIS — E872 Acidosis: Secondary | ICD-10-CM | POA: Diagnosis not present

## 2017-12-01 DIAGNOSIS — E871 Hypo-osmolality and hyponatremia: Secondary | ICD-10-CM | POA: Diagnosis not present

## 2017-12-01 DIAGNOSIS — K219 Gastro-esophageal reflux disease without esophagitis: Secondary | ICD-10-CM | POA: Diagnosis present

## 2017-12-01 DIAGNOSIS — I132 Hypertensive heart and chronic kidney disease with heart failure and with stage 5 chronic kidney disease, or end stage renal disease: Secondary | ICD-10-CM | POA: Diagnosis present

## 2017-12-01 DIAGNOSIS — I429 Cardiomyopathy, unspecified: Secondary | ICD-10-CM | POA: Diagnosis present

## 2017-12-01 DIAGNOSIS — N183 Chronic kidney disease, stage 3 (moderate): Secondary | ICD-10-CM | POA: Diagnosis not present

## 2017-12-01 DIAGNOSIS — N170 Acute kidney failure with tubular necrosis: Principal | ICD-10-CM | POA: Diagnosis present

## 2017-12-01 DIAGNOSIS — Z800 Family history of malignant neoplasm of digestive organs: Secondary | ICD-10-CM

## 2017-12-01 DIAGNOSIS — K921 Melena: Secondary | ICD-10-CM | POA: Diagnosis not present

## 2017-12-01 DIAGNOSIS — N179 Acute kidney failure, unspecified: Secondary | ICD-10-CM | POA: Diagnosis present

## 2017-12-01 DIAGNOSIS — N189 Chronic kidney disease, unspecified: Secondary | ICD-10-CM | POA: Diagnosis not present

## 2017-12-01 DIAGNOSIS — I739 Peripheral vascular disease, unspecified: Secondary | ICD-10-CM | POA: Diagnosis present

## 2017-12-01 DIAGNOSIS — L8990 Pressure ulcer of unspecified site, unspecified stage: Secondary | ICD-10-CM

## 2017-12-01 DIAGNOSIS — I959 Hypotension, unspecified: Secondary | ICD-10-CM | POA: Diagnosis not present

## 2017-12-01 DIAGNOSIS — Z8546 Personal history of malignant neoplasm of prostate: Secondary | ICD-10-CM | POA: Diagnosis not present

## 2017-12-01 DIAGNOSIS — R600 Localized edema: Secondary | ICD-10-CM | POA: Diagnosis not present

## 2017-12-01 DIAGNOSIS — L89152 Pressure ulcer of sacral region, stage 2: Secondary | ICD-10-CM | POA: Diagnosis present

## 2017-12-01 DIAGNOSIS — Z86010 Personal history of colonic polyps: Secondary | ICD-10-CM

## 2017-12-01 DIAGNOSIS — E875 Hyperkalemia: Secondary | ICD-10-CM | POA: Diagnosis not present

## 2017-12-01 DIAGNOSIS — M1A9XX1 Chronic gout, unspecified, with tophus (tophi): Secondary | ICD-10-CM | POA: Diagnosis present

## 2017-12-01 DIAGNOSIS — Z803 Family history of malignant neoplasm of breast: Secondary | ICD-10-CM

## 2017-12-01 DIAGNOSIS — G5600 Carpal tunnel syndrome, unspecified upper limb: Secondary | ICD-10-CM | POA: Diagnosis present

## 2017-12-01 DIAGNOSIS — R911 Solitary pulmonary nodule: Secondary | ICD-10-CM | POA: Diagnosis present

## 2017-12-01 DIAGNOSIS — Z808 Family history of malignant neoplasm of other organs or systems: Secondary | ICD-10-CM

## 2017-12-01 DIAGNOSIS — Z8269 Family history of other diseases of the musculoskeletal system and connective tissue: Secondary | ICD-10-CM | POA: Diagnosis not present

## 2017-12-01 DIAGNOSIS — T148XXA Other injury of unspecified body region, initial encounter: Secondary | ICD-10-CM

## 2017-12-01 DIAGNOSIS — N186 End stage renal disease: Secondary | ICD-10-CM | POA: Diagnosis present

## 2017-12-01 DIAGNOSIS — K5900 Constipation, unspecified: Secondary | ICD-10-CM | POA: Diagnosis not present

## 2017-12-01 DIAGNOSIS — E876 Hypokalemia: Secondary | ICD-10-CM | POA: Diagnosis not present

## 2017-12-01 DIAGNOSIS — M79605 Pain in left leg: Secondary | ICD-10-CM | POA: Diagnosis not present

## 2017-12-01 DIAGNOSIS — I10 Essential (primary) hypertension: Secondary | ICD-10-CM | POA: Diagnosis not present

## 2017-12-01 DIAGNOSIS — M1990 Unspecified osteoarthritis, unspecified site: Secondary | ICD-10-CM | POA: Diagnosis present

## 2017-12-01 DIAGNOSIS — R0600 Dyspnea, unspecified: Secondary | ICD-10-CM | POA: Diagnosis not present

## 2017-12-01 LAB — BASIC METABOLIC PANEL
Anion gap: 11 (ref 5–15)
BUN: 104 mg/dL — ABNORMAL HIGH (ref 6–20)
CALCIUM: 8.8 mg/dL — AB (ref 8.9–10.3)
CO2: 20 mmol/L — ABNORMAL LOW (ref 22–32)
CREATININE: 3.75 mg/dL — AB (ref 0.61–1.24)
Chloride: 104 mmol/L (ref 101–111)
GFR, EST AFRICAN AMERICAN: 16 mL/min — AB (ref >60)
GFR, EST NON AFRICAN AMERICAN: 14 mL/min — AB (ref >60)
Glucose, Bld: 106 mg/dL — ABNORMAL HIGH (ref 65–99)
Potassium: 5 mmol/L (ref 3.5–5.1)
SODIUM: 135 mmol/L (ref 135–145)

## 2017-12-01 LAB — CBC
HCT: 31 % — ABNORMAL LOW (ref 40.0–52.0)
Hemoglobin: 9.6 g/dL — ABNORMAL LOW (ref 13.0–18.0)
MCH: 22.3 pg — ABNORMAL LOW (ref 26.0–34.0)
MCHC: 31 g/dL — ABNORMAL LOW (ref 32.0–36.0)
MCV: 71.8 fL — ABNORMAL LOW (ref 80.0–100.0)
PLATELETS: 332 10*3/uL (ref 150–440)
RBC: 4.31 MIL/uL — AB (ref 4.40–5.90)
RDW: 20.9 % — ABNORMAL HIGH (ref 11.5–14.5)
WBC: 7.6 10*3/uL (ref 3.8–10.6)

## 2017-12-01 LAB — URINALYSIS, COMPLETE (UACMP) WITH MICROSCOPIC
BILIRUBIN URINE: NEGATIVE
GLUCOSE, UA: NEGATIVE mg/dL
Hgb urine dipstick: NEGATIVE
KETONES UR: NEGATIVE mg/dL
LEUKOCYTES UA: NEGATIVE
Nitrite: NEGATIVE
PH: 5 (ref 5.0–8.0)
Protein, ur: NEGATIVE mg/dL
SPECIFIC GRAVITY, URINE: 1.012 (ref 1.005–1.030)

## 2017-12-01 LAB — BRAIN NATRIURETIC PEPTIDE: B Natriuretic Peptide: 3281 pg/mL — ABNORMAL HIGH (ref 0.0–100.0)

## 2017-12-01 LAB — TROPONIN I: Troponin I: 0.05 ng/mL (ref <0.03)

## 2017-12-01 MED ORDER — SODIUM CHLORIDE 0.9 % IV SOLN
INTRAVENOUS | Status: DC
  Administered 2017-12-01 – 2017-12-02 (×2): via INTRAVENOUS

## 2017-12-01 MED ORDER — FINASTERIDE 5 MG PO TABS
5.0000 mg | ORAL_TABLET | Freq: Every day | ORAL | Status: DC
  Administered 2017-12-01 – 2017-12-17 (×16): 5 mg via ORAL
  Filled 2017-12-01 (×16): qty 1

## 2017-12-01 MED ORDER — ONDANSETRON HCL 4 MG/2ML IJ SOLN
4.0000 mg | Freq: Four times a day (QID) | INTRAMUSCULAR | Status: DC | PRN
  Administered 2017-12-05 – 2017-12-08 (×4): 4 mg via INTRAVENOUS
  Filled 2017-12-01 (×4): qty 2

## 2017-12-01 MED ORDER — ALBUTEROL SULFATE (2.5 MG/3ML) 0.083% IN NEBU: 2.5000 mg | INHALATION_SOLUTION | RESPIRATORY_TRACT | Status: DC | PRN

## 2017-12-01 MED ORDER — ATORVASTATIN CALCIUM 20 MG PO TABS
40.0000 mg | ORAL_TABLET | Freq: Every day | ORAL | Status: DC
  Administered 2017-12-01 – 2017-12-17 (×16): 40 mg via ORAL
  Filled 2017-12-01 (×12): qty 2
  Filled 2017-12-01: qty 4
  Filled 2017-12-01 (×2): qty 2
  Filled 2017-12-01: qty 4
  Filled 2017-12-01: qty 2

## 2017-12-01 MED ORDER — PANTOPRAZOLE SODIUM 40 MG PO TBEC
40.0000 mg | DELAYED_RELEASE_TABLET | Freq: Every day | ORAL | Status: DC
  Administered 2017-12-01 – 2017-12-17 (×16): 40 mg via ORAL
  Filled 2017-12-01 (×15): qty 1

## 2017-12-01 MED ORDER — SENNOSIDES-DOCUSATE SODIUM 8.6-50 MG PO TABS
1.0000 | ORAL_TABLET | Freq: Every evening | ORAL | Status: DC | PRN
  Administered 2017-12-10: 1 via ORAL
  Filled 2017-12-01: qty 1

## 2017-12-01 MED ORDER — SODIUM CHLORIDE 0.9 % IV SOLN: Freq: Once | INTRAVENOUS | Status: AC

## 2017-12-01 MED ORDER — ONDANSETRON HCL 4 MG PO TABS: 4.0000 mg | ORAL_TABLET | Freq: Four times a day (QID) | ORAL | Status: DC | PRN

## 2017-12-01 MED ORDER — BISACODYL 5 MG PO TBEC
5.0000 mg | DELAYED_RELEASE_TABLET | Freq: Every day | ORAL | Status: DC | PRN
  Administered 2017-12-08 – 2017-12-10 (×2): 5 mg via ORAL
  Filled 2017-12-01 (×2): qty 1

## 2017-12-01 MED ORDER — ACETAMINOPHEN 325 MG PO TABS
650.0000 mg | ORAL_TABLET | Freq: Four times a day (QID) | ORAL | Status: DC | PRN
  Administered 2017-12-07 – 2017-12-15 (×3): 650 mg via ORAL
  Filled 2017-12-01 (×3): qty 2

## 2017-12-01 MED ORDER — ACETAMINOPHEN 650 MG RE SUPP: 650.0000 mg | Freq: Four times a day (QID) | RECTAL | Status: DC | PRN

## 2017-12-01 MED ORDER — LORATADINE 10 MG PO TABS
10.0000 mg | ORAL_TABLET | Freq: Every day | ORAL | Status: DC
  Administered 2017-12-01 – 2017-12-04 (×4): 10 mg via ORAL
  Filled 2017-12-01 (×4): qty 1

## 2017-12-01 MED ORDER — HEPARIN SODIUM (PORCINE) 5000 UNIT/ML IJ SOLN
5000.0000 [IU] | Freq: Three times a day (TID) | INTRAMUSCULAR | Status: DC
  Administered 2017-12-01 – 2017-12-04 (×10): 5000 [IU] via SUBCUTANEOUS
  Filled 2017-12-01 (×11): qty 1

## 2017-12-01 MED ORDER — SODIUM CHLORIDE 0.9 % IV BOLUS: 500.0000 mL | Freq: Once | INTRAVENOUS | Status: DC

## 2017-12-01 MED ORDER — ASPIRIN 325 MG PO TABS
325.0000 mg | ORAL_TABLET | Freq: Every day | ORAL | Status: DC
  Administered 2017-12-01 – 2017-12-17 (×16): 325 mg via ORAL
  Filled 2017-12-01 (×18): qty 1

## 2017-12-01 MED ORDER — HYDROCODONE-ACETAMINOPHEN 5-325 MG PO TABS: 1.0000 | ORAL_TABLET | ORAL | Status: DC | PRN

## 2017-12-01 NOTE — ED Triage Notes (Signed)
Pt arrived via POV from home with reports of dizziness, shortness of breath and BLE.  Pt c/o sweating in feet and swelling in great toe on the right.  Pt has other medical complaints such as problems urinating and blurry vision.

## 2017-12-01 NOTE — H&P (Signed)
Andrew [name] at [place] NAME: Andrew [name]    MR#:  [id number]  DATE OF BIRTH:  06/25/1933  DATE OF ADMISSION:  12/01/2017  PRIMARY CARE PHYSICIAN: [name], MD   REQUESTING/REFERRING PHYSICIAN: Schuyler Amor, MD  CHIEF COMPLAINT:   Chief Complaint  Patient presents with  . Dizziness  . Leg Swelling  . Shortness of Breath   Dizziness and leg swelling for 1 week HISTORY OF PRESENT ILLNESS:  Andrew [name]  is a 84 y.o. male with a known history of multiple medical problems as below.  The patient presents to the ED with above chief complaint.  He has had dizziness and leg swelling for 1 week.  He denies any fever or chills, no dysuria or hematuria but has less urine.  Leg swelling has been worsening for the past 1 week. He was found worsening elevated creatinine at 3.75. PAST MEDICAL HISTORY:   Past Medical History:  Diagnosis Date  . Arteriosclerosis of coronary artery 01/27/2014  . Benign essential HTN 12/11/2013  . Carpal tunnel syndrome   . Coronary atherosclerosis of native coronary artery   . Disorder of mitral valve 12/11/2013  . Encounter for long-term (current) use of antiplatelets/antithrombotics   . Gout   . Hyperlipidemia   . Hypertension   . Lung nodule    found on xray in Aug 2018  . Mitral valve disorder   . Osteoarthritis   . Prostate cancer ([place])     PAST SURGICAL HISTORY:   Past Surgical History:  Procedure Laterality Date  . COLONOSCOPY    . COLONOSCOPY WITH PROPOFOL N/A 07/23/2016   Procedure: COLONOSCOPY WITH PROPOFOL;  Surgeon: Manya Silvas, MD;  Location: [facility] ENDOSCOPY;  Service: Endoscopy;  Laterality: N/A;  . CORONARY ANGIOPLASTY WITH STENT PLACEMENT    . ESOPHAGOGASTRODUODENOSCOPY Left 07/28/2017   Procedure: ESOPHAGOGASTRODUODENOSCOPY (EGD);  Surgeon: Virgel Manifold, MD;  Location: [facility] ENDOSCOPY;  Service: Endoscopy;  Laterality: Left;  . ESOPHAGOGASTRODUODENOSCOPY (EGD) WITH PROPOFOL  N/A 07/23/2016   Procedure: ESOPHAGOGASTRODUODENOSCOPY (EGD) WITH PROPOFOL;  Surgeon: Manya Silvas, MD;  Location: [facility] ENDOSCOPY;  Service: Endoscopy;  Laterality: N/A;  . PROSTATE BIOPSY      SOCIAL HISTORY:   Social History   Tobacco Use  . Smoking status: Never Smoker  . Smokeless tobacco: Never Used  Substance Use Topics  . Alcohol use: No    FAMILY HISTORY:   Family History  Problem Relation Age of Onset  . Cancer Mother        stomach and uterus  . Cancer Sister        breast    DRUG ALLERGIES:  No Known Allergies  REVIEW OF SYSTEMS:   Review of Systems  Constitutional: Positive for malaise/fatigue. Negative for chills and fever.  HENT: Negative for sore throat.   Eyes: Negative for blurred vision and double vision.  Respiratory: Positive for shortness of breath. Negative for cough, hemoptysis, wheezing and stridor.   Cardiovascular: Positive for leg swelling. Negative for chest pain, palpitations and orthopnea.  Gastrointestinal: Negative for abdominal pain, blood in stool, diarrhea, melena, nausea and vomiting.  Genitourinary: Negative for dysuria, flank pain and hematuria.       Less urine  Musculoskeletal: Negative for back pain and joint pain.  Neurological: Positive for dizziness. Negative for sensory change, focal weakness, seizures, loss of consciousness, weakness and headaches.  Endo/Heme/Allergies: Negative for polydipsia.  Psychiatric/Behavioral: Negative for depression. The patient is not nervous/anxious.  MEDICATIONS AT HOME:   Prior to Admission medications   Medication Sig Start Date End Date Taking? Authorizing Provider  aspirin 325 MG tablet Take 325 mg by mouth daily.     [provider]  atorvastatin (LIPITOR) 40 MG tablet Take 40 mg by mouth daily. 04/24/15   [provider]  finasteride (PROSCAR) 5 MG tablet Take 5 mg by mouth daily. 04/13/15   [provider]  loratadine (CLARITIN) 10 MG tablet Take 1  tablet (10 mg total) by mouth daily. 11/03/17   Nicholes Mango, MD  omeprazole (PRILOSEC) 20 MG capsule Take 1 capsule (20 mg total) by mouth 2 (two) times daily. 07/28/17 07/28/18  Bettey Costa, MD  torsemide (DEMADEX) 20 MG tablet Take 2 tablets (40 mg total) by mouth daily. 11/02/17   Nicholes Mango, MD      VITAL SIGNS:  Blood pressure 108/74, pulse (!) 47, temperature 98.6 F (37 C), temperature source Oral, resp. rate 18, height 5\' 9"  (1.753 m), weight 163 lb (73.9 kg), SpO2 98 %.  PHYSICAL EXAMINATION:  Physical Exam  GENERAL:  84 y.o.-year-old patient lying in the bed with no acute distress.  EYES: Pupils equal, round, reactive to light and accommodation. No scleral icterus. Extraocular muscles intact.  HEENT: Head atraumatic, normocephalic. Oropharynx and nasopharynx clear.  NECK:  Supple, no jugular venous distention. No thyroid enlargement, no tenderness.  LUNGS: Normal breath sounds bilaterally, no wheezing, rales,rhonchi or crepitation. No use of accessory muscles of respiration.  CARDIOVASCULAR: S1, S2 normal. No murmurs, rubs, or gallops.  ABDOMEN: Soft, nontender, nondistended. Bowel sounds present. No organomegaly or mass.  EXTREMITIES: Bilateral leg, ankle and pedal edema, no cyanosis, or clubbing.  Tenderness on right big toe. NEUROLOGIC: Cranial nerves II through XII are intact. Muscle strength 5/5 in all extremities. Sensation intact. Gait not checked.  PSYCHIATRIC: The patient is alert and oriented x 3.  SKIN: No obvious rash, lesion, or ulcer.   LABORATORY PANEL:   CBC Recent Labs  Lab 12/01/17 1004  WBC 7.6  HGB 9.6*  HCT 31.0*  PLT 332   ------------------------------------------------------------------------------------------------------------------  Chemistries  Recent Labs  Lab 12/01/17 1004  NA 135  K 5.0  CL 104  CO2 20*  GLUCOSE 106*  BUN 104*  CREATININE 3.75*  CALCIUM 8.8*    ------------------------------------------------------------------------------------------------------------------  Cardiac Enzymes No results for input(s): TROPONINI in the last 168 hours. ------------------------------------------------------------------------------------------------------------------  RADIOLOGY:  Dg Chest 2 View  Result Date: 12/01/2017 CLINICAL DATA:  Patient reports SOB, dizziness and bilateral LE swelling onset 2 days ago. Hx prostate ca, mitral valve disorder, lung nodule, HTN, cardiac stent placement. Non-smoker. EXAM: CHEST - 2 VIEW COMPARISON:  10/31/2017 FINDINGS: Heart is enlarged and stable in configuration. There no focal consolidations. Small RIGHT pleural effusion or pleural thickening persists and is smaller. No pulmonary edema. IMPRESSION: 1. Stable cardiomegaly. 2. Smaller RIGHT pleural effusion. Electronically Signed   By: Nolon Nations M.D.   On: 12/01/2017 11:06      IMPRESSION AND PLAN:   Acute renal failure on CKD stage 4. The patient will be admitted to medical floor. Hold torsemide, IV fluids support and follow-up BMP.  Kidney ultrasound and echocardiograph. Follow-up nephrology consult.  Hypertension.  Hold torsemide due to low blood pressure.  CAD.  Continue aspirin and atorvastatin. Hyperlipidemia.  Continue atorvastatin.  Anemia of chronic disease.  Stable.  All the records are reviewed and case discussed with ED provider. Management plans discussed with the patient, family and they are in agreement.  CODE STATUS: Full code TOTAL TIME TAKING CARE OF THIS PATIENT: 52 minutes.    Demetrios Loll M.D on 12/01/2017 at 1:23 PM  Between 7am to 6pm - Pager - [phone]  After 6pm go to [email or web address] - Proofreader  [facility]  Office  [phone]  CC: Primary care physician; [name], MD   Note: This dictation was prepared with Dragon dictation along with smaller phrase technology. Any  transcriptional errors that result from this process are unin

## 2017-12-01 NOTE — Progress Notes (Signed)
Advanced Care Plan.  Purpose of Encounter: CODE STATUS. Parties in Attendance: The patient, his friend and me. Patient's Decisional Capacity: Yes. Medical Story: Andrew [name]  is a 84 y.o. male with a known history of multiple medical problems including CAD, hypertension, hyperlipidemia, CKD, prostate cancer and a lung nodule.  The patient came to ED due to dizziness and leg swelling for 1 week.  He is found acute on chronic renal failure and low blood pressure.  I discussed with the patient about his condition, prognosis and CODE STATUS.  He said he wants full code.  Plan:  Code Status: Full code. Time spent discussing advance care planning: 17 minutes

## 2017-12-01 NOTE — ED Provider Notes (Signed)
[facility] Emergency Department Provider Note  ____________________________________________   I have reviewed the triage vital signs and the nursing notes. Where available I have reviewed prior notes and, if possible and indicated, outside hospital notes.    HISTORY  Chief Complaint Dizziness; Leg Swelling; and Shortness of Breath    HPI Andrew [name] is a 84 y.o. male a history of chronic renal insufficiency increased lower extremity swelling, his toenails also coming off on the left.  In addition, he feels generally somewhat weak.  Not focally.  Denies any fever chills or cough.  Does not no shortness of breath over his baseline.  Denies any chest pain.  Family are concerned that he might be dehydrated.   No fever no chills, feels that he is taking p.o., is still on his diuretic.Marland Kitchen   Past Medical History:  Diagnosis Date  . Arteriosclerosis of coronary artery 01/27/2014  . Benign essential HTN 12/11/2013  . Carpal tunnel syndrome   . Coronary atherosclerosis of native coronary artery   . Disorder of mitral valve 12/11/2013  . Encounter for long-term (current) use of antiplatelets/antithrombotics   . Gout   . Hyperlipidemia   . Hypertension   . Lung nodule    found on xray in Aug 2018  . Mitral valve disorder   . Osteoarthritis   . Prostate cancer [facility])     Patient Active Problem List   Diagnosis Date Noted  . Unstable angina ([place]) 10/31/2017  . Elevated troponin 10/30/2017  . Chronic systolic CHF (congestive heart failure) ([place]) 10/30/2017  . Acute posthemorrhagic anemia   . Acute gastric ulcer without hemorrhage or perforation   . GERD (gastroesophageal reflux disease)   . GI bleed 07/26/2017  . Acute renal failure superimposed on chronic kidney disease ([place]) 04/08/2017  . PAD (peripheral artery disease) ([place]) 06/04/2016  . Colon polyp 06/29/2015  . Malignant neoplasm of prostate ([place]) 11/14/2014  . HLD (hyperlipidemia) 07/29/2014  . Long  term current use of antithrombotics/antiplatelets 01/27/2014  . Arteriosclerosis of coronary artery 01/27/2014  . Carpal tunnel syndrome 12/11/2013  . Benign essential HTN 12/11/2013  . Lesion of ulnar nerve 12/11/2013  . Disorder of mitral valve 12/11/2013  . Arthritis, degenerative 12/11/2013  . Hereditary and idiopathic neuropathy 12/11/2013    Past Surgical History:  Procedure Laterality Date  . COLONOSCOPY    . COLONOSCOPY WITH PROPOFOL N/A 07/23/2016   Procedure: COLONOSCOPY WITH PROPOFOL;  Surgeon: Manya Silvas, MD;  Location: [facility] ENDOSCOPY;  Service: Endoscopy;  Laterality: N/A;  . CORONARY ANGIOPLASTY WITH STENT PLACEMENT    . ESOPHAGOGASTRODUODENOSCOPY Left 07/28/2017   Procedure: ESOPHAGOGASTRODUODENOSCOPY (EGD);  Surgeon: Virgel Manifold, MD;  Location: [facility] ENDOSCOPY;  Service: Endoscopy;  Laterality: Left;  . ESOPHAGOGASTRODUODENOSCOPY (EGD) WITH PROPOFOL N/A 07/23/2016   Procedure: ESOPHAGOGASTRODUODENOSCOPY (EGD) WITH PROPOFOL;  Surgeon: Manya Silvas, MD;  Location: [facility] ENDOSCOPY;  Service: Endoscopy;  Laterality: N/A;  . PROSTATE BIOPSY      Prior to Admission medications   Medication Sig Start Date End Date Taking? Authorizing Provider  aspirin 325 MG tablet Take 325 mg by mouth daily.     [provider]  atorvastatin (LIPITOR) 40 MG tablet Take 40 mg by mouth daily. 04/24/15   [provider]  finasteride (PROSCAR) 5 MG tablet Take 5 mg by mouth daily. 04/13/15   [provider]  loratadine (CLARITIN) 10 MG tablet Take 1 tablet (10 mg total) by mouth daily. 11/03/17   Nicholes Mango, MD  omeprazole (  PRILOSEC) 20 MG capsule Take 1 capsule (20 mg total) by mouth 2 (two) times daily. 07/28/17 07/28/18  Bettey Costa, MD  torsemide (DEMADEX) 20 MG tablet Take 2 tablets (40 mg total) by mouth daily. 11/02/17   Nicholes Mango, MD    Allergies Patient has no known allergies.  Family History  Problem Relation Age of Onset  . Cancer  Mother        stomach and uterus  . Cancer Sister        breast    Social History Social History   Tobacco Use  . Smoking status: Never Smoker  . Smokeless tobacco: Never Used  Substance Use Topics  . Alcohol use: No  . Drug use: No    Review of Systems Constitutional: No fever/chills Eyes: No visual changes. ENT: No sore throat. No stiff neck no neck pain Cardiovascular: Denies chest pain. Respiratory: Denies shortness of breath. Gastrointestinal:   no vomiting.  No diarrhea.  No constipation. Genitourinary: Negative for dysuria. Musculoskeletal: + lower extremity swelling Skin: Negative for rash. Neurological: Negative for severe headaches, focal weakness or numbness.   ____________________________________________   PHYSICAL EXAM:  VITAL SIGNS: ED Triage Vitals  Enc Vitals Group     BP 12/01/17 0948 108/74     Pulse Rate 12/01/17 0948 (!) 47     Resp 12/01/17 0948 18     Temp 12/01/17 0948 98.6 F (37 C)     Temp Source 12/01/17 0948 Oral     SpO2 12/01/17 0948 98 %     Weight 12/01/17 1002 163 lb (73.9 kg)     Height 12/01/17 1002 5\' 9"  (1.753 m)     Head Circumference --      Peak Flow --      Pain Score 12/01/17 1002 0     Pain Loc --      Pain Edu? --      Excl. in [place]? --     Constitutional: Alert and oriented. Well appearing and in no acute distress. Eyes: Conjunctivae are normal Head: Atraumatic HEENT: No congestion/rhinnorhea. Mucous membranes are dry.  Oropharynx non-erythematous Neck:   Nontender with no meningismus, no masses, no stridor Cardiovascular: Normal rate, regular rhythm. Grossly normal heart sounds.  Good peripheral circulation. Respiratory: Normal respiratory effort.  No retractions. Lungs CTAB. Abdominal: Soft and nontender. No distention. No guarding no rebound Back:  There is no focal tenderness or step off.  there is no midline tenderness there are no lesions noted. there is no CVA tenderness Musculoskeletal: No lower  extremity tenderness, no upper extremity tenderness. No joint effusions, no DVT signs bilateral pitting edema with some weeping skin changes noted bilaterally, pulses are noted.  Patient does have some degree of breakdown around the great toe but there is no significant erythema or streaks from the area.  Not hot to touch. Neurologic:  Normal speech and language. No gross focal neurologic deficits are appreciated.  Skin:  Skin is warm, dry and intact. No rash noted. Psychiatric: Mood and affect are normal. Speech and behavior are normal.  ____________________________________________   LABS (all labs ordered are listed, but only abnormal results are displayed)  Labs Reviewed  BASIC METABOLIC PANEL - Abnormal; Notable for the following components:      Result Value   CO2 20 (*)    Glucose, Bld 106 (*)    BUN 104 (*)    Creatinine, Ser 3.75 (*)    Calcium 8.8 (*)    GFR calc non  Af Amer 14 (*)    GFR calc Af Amer 16 (*)    All other components within normal limits  CBC - Abnormal; Notable for the following components:   RBC 4.31 (*)    Hemoglobin 9.6 (*)    HCT 31.0 (*)    MCV 71.8 (*)    MCH 22.3 (*)    MCHC 31.0 (*)    RDW 20.9 (*)    All other components within normal limits  URINALYSIS, COMPLETE (UACMP) WITH MICROSCOPIC  BRAIN NATRIURETIC PEPTIDE  TROPONIN I    Pertinent labs  results that were available during my care of the patient were reviewed by me and considered in my medical decision making (see chart for details). ____________________________________________  EKG  I personally interpreted any EKGs ordered by me or triage Sinus rhythm rate 92 bpm PVCs noted no acute ischemia noted, LAD noted minutes  ____________________________________________  RADIOLOGY  Pertinent labs & imaging results that were available during my care of the patient were reviewed by me and considered in my medical decision making (see chart for details). If possible, patient and/or family  made aware of any abnormal findings.  Dg Chest 2 View  Result Date: 12/01/2017 CLINICAL DATA:  Patient reports SOB, dizziness and bilateral LE swelling onset 2 days ago. Hx prostate ca, mitral valve disorder, lung nodule, HTN, cardiac stent placement. Non-smoker. EXAM: CHEST - 2 VIEW COMPARISON:  10/31/2017 FINDINGS: Heart is enlarged and stable in configuration. There no focal consolidations. Small RIGHT pleural effusion or pleural thickening persists and is smaller. No pulmonary edema. IMPRESSION: 1. Stable cardiomegaly. 2. Smaller RIGHT pleural effusion. Electronically Signed   By: Nolon Nations M.D.   On: 12/01/2017 11:06   ____________________________________________    PROCEDURES  Procedure(s) performed: None  Procedures  Critical Care performed: CRITICAL CARE Performed by: Schuyler Amor   Total critical care time: 42 Critical care time was exclusive of separately billable procedures and treating other patients.  Critical care was necessary to treat or prevent imminent or life-threatening deterioration.  Critical care was time spent personally by me on the following activities: development of treatment plan with patient and/or surrogate as well as nursing, discussions with consultants, evaluation of patient's response to treatment, examination of patient, obtaining history from patient or surrogate, ordering and performing treatments and interventions, ordering and review of laboratory studies, ordering and review of radiographic studies, pulse oximetry and re-evaluation of patient's condition.   ____________________________________________   INITIAL IMPRESSION / ASSESSMENT AND PLAN / ED COURSE  Pertinent labs & imaging results that were available during my care of the patient were reviewed by me and considered in my medical decision making (see chart for details).  Patient here with leg swelling which appears to be chronic however his BUN and creatinine are  significantly elevated.  Last December his creatinine was 2 noticed 3.75 it appears to be getting worse.  BUN is 104.  This is likely due to his diuretics.  We are giving him gentle IV fluids mindful of the fact that he does have CHF with lower extremity edema.  Patient will require admission to the hospital for this.    ____________________________________________   FINAL CLINICAL IMPRESSION(S) / ED DIAGNOSES  Final diagnoses:  AKI (acute kidney injury) ([place])  Peripheral edema      This chart was dictated using voice recognition software.  Despite best efforts to proofread,  errors can occur which can change meaning.      Schuyler Amor, MD  12/01/17 1301  

## 2017-12-01 NOTE — Progress Notes (Signed)
On admission, noted to have stage 2 coccyl decubiti diameter of pencil eraser, pink foam placed over area. 4+ pitting edema BLE, L anterior foot w/small vesicles that are draining small amount serous fluid.

## 2017-12-02 ENCOUNTER — Inpatient Hospital Stay (HOSPITAL_COMMUNITY)
Admit: 2017-12-02 | Discharge: 2017-12-02 | Disposition: A | Discharge status: Home / Self Care | Payer: Medicare Other | Attending: Internal Medicine | Admitting: Internal Medicine

## 2017-12-02 ENCOUNTER — Inpatient Hospital Stay: Payer: Medicare Other

## 2017-12-02 DIAGNOSIS — I10 Essential (primary) hypertension: Secondary | ICD-10-CM

## 2017-12-02 DIAGNOSIS — R600 Localized edema: Secondary | ICD-10-CM

## 2017-12-02 DIAGNOSIS — M79605 Pain in left leg: Secondary | ICD-10-CM

## 2017-12-02 DIAGNOSIS — R0600 Dyspnea, unspecified: Secondary | ICD-10-CM

## 2017-12-02 DIAGNOSIS — N183 Chronic kidney disease, stage 3 (moderate): Secondary | ICD-10-CM

## 2017-12-02 DIAGNOSIS — L8990 Pressure ulcer of unspecified site, unspecified stage: Secondary | ICD-10-CM

## 2017-12-02 LAB — URIC ACID: Uric Acid, Serum: 13.1 mg/dL — ABNORMAL HIGH (ref 4.4–7.6)

## 2017-12-02 LAB — BASIC METABOLIC PANEL
Anion gap: 10 (ref 5–15)
BUN: 96 mg/dL — AB (ref 6–20)
CHLORIDE: 106 mmol/L (ref 101–111)
CO2: 21 mmol/L — AB (ref 22–32)
Calcium: 8.3 mg/dL — ABNORMAL LOW (ref 8.9–10.3)
Creatinine, Ser: 3.62 mg/dL — ABNORMAL HIGH (ref 0.61–1.24)
GFR calc Af Amer: 17 mL/min — ABNORMAL LOW (ref >60)
GFR calc non Af Amer: 14 mL/min — ABNORMAL LOW (ref >60)
Glucose, Bld: 108 mg/dL — ABNORMAL HIGH (ref 65–99)
Potassium: 4.7 mmol/L (ref 3.5–5.1)
SODIUM: 137 mmol/L (ref 135–145)

## 2017-12-02 LAB — ECHOCARDIOGRAM COMPLETE
HEIGHTINCHES: 69 in
WEIGHTICAEL: 2608 [oz_av]

## 2017-12-02 MED ORDER — FUROSEMIDE 10 MG/ML IJ SOLN
8.0000 mg/h | INTRAVENOUS | Status: DC
  Administered 2017-12-02: 4 mg/h via INTRAVENOUS
  Administered 2017-12-04: 8 mg/h via INTRAVENOUS
  Filled 2017-12-02 (×3): qty 25

## 2017-12-02 MED ORDER — OCUVITE-LUTEIN PO CAPS
1.0000 | ORAL_CAPSULE | Freq: Every day | ORAL | Status: DC
  Administered 2017-12-02: 1 via ORAL
  Filled 2017-12-02 (×2): qty 1

## 2017-12-02 NOTE — Consult Note (Signed)
Reason for Consult: Foot pain.  Rule out gout  Referring Physician: Dr. Darylene Price   HPI: 84 year old African-American male.  History of cardiomyopathy.  Prior stent.  MR.  Recent increasing swelling in his legs.  Had hospitalization.   Prior history of gouty arthritis.  Story of olecranon tophus.  Had been on 400 allopurinol.  Was then lowered to 300 allopurinol.  His record indicates 100 but he thinks it was 300.  Recent hospitalization he said allopurinol was discontinued For the last several days has had pain in the first 3 toes mainly the first toe on the left.  Drained some whitish material.  Sometimes will hurt to touch.  His hands shoulders elbows and knees have not been swelling or painful Had recent hospitalization with diffuse edema.  Worsening creatinine.  Used to be 2.0 and now 3.75.  Getting diuretics.  He is anemic.  Uric acid was 13.1.  2018 it was 5.1.  BNP elevated at 3281.  Chest x-ray shows some cardiomegaly.  Echo shows MR. PMH: Coronary disease with stenting.  Renal insufficiency.  SURGICAL HISTORY: No joint surgery.  Family History: Family history of gout  Social History: Denies alcohol.  Denies recent cigarettes.  Allergies: No Known Allergies  Medications:  Scheduled: . aspirin  325 mg Oral Daily  . atorvastatin  40 mg Oral Daily  . finasteride  5 mg Oral Daily  . heparin  5,000 Units Subcutaneous Q8H  . loratadine  10 mg Oral Daily  . multivitamin-lutein  1 capsule Oral Daily  . pantoprazole  40 mg Oral Daily        ROS: No recent chest pain.  No recent fever.  No rash.  No abdominal pain   PHYSICAL EXAM: Blood pressure 102/76, pulse 89, temperature 98.2 F (36.8 C), resp. rate 20, height 5\' 9"  (1.753 m), weight 73.9 kg (163 lb), SpO2 100 %. Pleasant male.  Sitting with leg over the side of the bed.  Sclera mildly pale.  Minimal rales.  Systolic murmur in the apex.  No definite visceromegaly.  He has presacral edema at 2+.  3+ lower  extremity edema bilaterally.  Weeping in the foot.  The feet are warm.  I think I feel the dorsalis pedis in both feet. Musculoskeletal hands mild hypertrophic changes.  No olecranon nodules.  Shoulders move well.  Hips move reasonably.  No knee effusions.  Left first DIP is tender.  Not currently draining.  MTP minimally tender.  Second toe is mildly tender to palpate as is the third.  There is mild evidence of scan denuding.  Achilles insertion and ankle without synovitis or tenderness.  Right foot without synovitis or tophi. Neurologic: Symmetrically hyporeflexic  Assessment: Left first toe DIP tenderness with recent drainage.  Cannot rule out tophus.  Could be part of generalized edema or degenerative or vascular disease.  Feet however appear warm Significant hyperuricemia Acute on chronic renal failure Cardiomyopathy/MR/congestive heart failure Anasarca  Recommendations: X-ray left first toe to rule out tophus.  Recommend restart allopurinol.  Renal function is worse so would recommend 50 mg every other day.   No indication for injection or steroid at this point.   Vascular opinion upcoming.  Liliane Bade W 12/02/2017, 3:51 PM

## 2017-12-02 NOTE — Progress Notes (Signed)
[facility] [place] NAME: Andrew [name]    MR#:  [id number]  DATE OF BIRTH:  19-Dec-1932  SUBJECTIVE:  CHIEF COMPLAINT:   Chief Complaint  Patient presents with  . Dizziness  . Leg Swelling  . Shortness of Breath  not looking too good, lots of swelling in LE (lt > rt), weak REVIEW OF SYSTEMS:  Review of Systems  Constitutional: Negative for chills, fever and weight loss.  HENT: Negative for nosebleeds and sore throat.   Eyes: Negative for blurred vision.  Respiratory: Negative for cough, shortness of breath and wheezing.   Cardiovascular: Positive for leg swelling. Negative for chest pain, orthopnea and PND.  Gastrointestinal: Negative for abdominal pain, constipation, diarrhea, heartburn, nausea and vomiting.  Genitourinary: Negative for dysuria and urgency.  Musculoskeletal: Negative for back pain.  Skin: Negative for rash.  Neurological: Negative for dizziness, speech change, focal weakness and headaches.  Endo/Heme/Allergies: Does not bruise/bleed easily.  Psychiatric/Behavioral: Negative for depression.    DRUG ALLERGIES:  No Known Allergies VITALS:  Blood pressure 102/76, pulse 89, temperature 98.2 F (36.8 C), resp. rate 20, height 5\' 9"  (1.753 m), weight 73.9 kg (163 lb), SpO2 100 %. PHYSICAL EXAMINATION:  Physical Exam  Constitutional: He is oriented to person, place, and time.  HENT:  Head: Normocephalic and atraumatic.  Eyes: Pupils are equal, round, and reactive to light. Conjunctivae and EOM are normal.  Neck: Normal range of motion. Neck supple. No tracheal deviation present. No thyromegaly present.  Cardiovascular: Normal rate, regular rhythm and normal heart sounds.  Pulmonary/Chest: Effort normal and breath sounds normal. No respiratory distress. He has no wheezes. He exhibits no tenderness.  Abdominal: Soft. Bowel sounds are normal. He exhibits no distension. There is no tenderness.  Musculoskeletal: Normal  range of motion. He exhibits edema.  Neurological: He is alert and oriented to person, place, and time. No cranial nerve deficit.  Skin: Skin is warm and dry. No rash noted.   LABORATORY PANEL:  Male CBC Recent Labs  Lab 12/01/17 1004  WBC 7.6  HGB 9.6*  HCT 31.0*  PLT 332   ------------------------------------------------------------------------------------------------------------------ Chemistries  Recent Labs  Lab 12/02/17 0509  NA 137  K 4.7  CL 106  CO2 21*  GLUCOSE 108*  BUN 96*  CREATININE 3.62*  CALCIUM 8.3*   RADIOLOGY:  US Venous Img Lower Bilateral  Result Date: 12/02/2017 CLINICAL DATA:  Swelling in both legs, pain, history prostate cancer EXAM: BILATERAL LOWER EXTREMITY VENOUS DOPPLER ULTRASOUND TECHNIQUE: Gray-scale sonography with graded compression, as well as color Doppler and duplex ultrasound were performed to evaluate the lower extremity deep venous systems from the level of the common femoral vein and including the common femoral, femoral, profunda femoral, popliteal and calf veins including the posterior tibial, peroneal and gastrocnemius veins when visible. The superficial great saphenous vein was also interrogated. Spectral Doppler was utilized to evaluate flow at rest and with distal augmentation maneuvers in the common femoral, femoral and popliteal veins. COMPARISON:  None. FINDINGS: RIGHT LOWER EXTREMITY Common Femoral Vein: No evidence of thrombus. Normal compressibility, respiratory phasicity and response to augmentation. Saphenofemoral Junction: No evidence of thrombus. Normal compressibility and flow on color Doppler imaging. Profunda Femoral Vein: No evidence of thrombus. Normal compressibility and flow on color Doppler imaging. Femoral Vein: No evidence of thrombus. Normal compressibility, respiratory phasicity and response to augmentation. Popliteal Vein: No evidence of thrombus. Normal compressibility, respiratory phasicity and response to  augmentation. Calf Veins: No  evidence of thrombus. Normal compressibility and flow on color Doppler imaging. Superficial Great Saphenous Vein: No evidence of thrombus. Normal compressibility. Venous Reflux:  None. Other Findings:  None. LEFT LOWER EXTREMITY Common Femoral Vein: No evidence of thrombus. Normal compressibility, respiratory phasicity and response to augmentation. Saphenofemoral Junction: No evidence of thrombus. Normal compressibility and flow on color Doppler imaging. Profunda Femoral Vein: No evidence of thrombus. Normal compressibility and flow on color Doppler imaging. Femoral Vein: No evidence of thrombus. Normal compressibility, respiratory phasicity and response to augmentation. Popliteal Vein: No evidence of thrombus. Normal compressibility, respiratory phasicity and response to augmentation. Calf Veins: No evidence of thrombus. Normal compressibility and flow on color Doppler imaging. Superficial Great Saphenous Vein: No evidence of thrombus. Normal compressibility. Venous Reflux:  None. Other Findings:  None. IMPRESSION: No evidence of deep venous thrombosis in either lower extremity. Electronically Signed   By: Lavonia Dana M.D.   On: 12/02/2017 14:15   Dg Toe Great Left  Result Date: 12/02/2017 CLINICAL DATA:  Left great toe pain swelling. EXAM: LEFT GREAT TOE COMPARISON:  None. FINDINGS: No acute osseous abnormality. Chondrocalcinosis is seen in the first metatarsophalangeal joint. IMPRESSION: Chondrocalcinosis in the first metatarsophalangeal joint can be seen with calcium pyrophosphate deposition disease. Electronically Signed   By: Lorin Picket M.D.   On: 12/02/2017 16:45   ASSESSMENT AND PLAN:  84 y.o.malewith a known history of multiple medical problems including CAD, hypertension, hyperlipidemia, CKD, prostate cancer and a lung nodule.  The patient came to ED due to dizziness and leg swelling for 1 week  * Acute renal failure on CKD stage 3. Creat 3.6 - lasix drip per  Nephro  * Hypertension.  Hold torsemide due to low blood pressure.  * CAD.  Continue aspirin and atorvastatin.  * Hyperlipidemia.  Continue atorvastatin.  * Anemia of chronic kidney disease.  Stable.  * LE swelling: likely gouty arthritis, appreciate Rheum, Podiatry and Vascular input.   Hope to send home with [facility] are reviewed and case discussed with Care Management/Social Worker. Management plans discussed with the patient, nursing and they are in agreement.  CODE STATUS: DNR  TOTAL TIME TAKING CARE OF THIS PATIENT: 35 minutes.   More than 50% of the time was spent in counseling/coordination of care: YES  POSSIBLE D/C IN 2-3 DAYS, DEPENDING ON CLINICAL CONDITION.   Max Sane M.D on 12/02/2017 at 5:13 PM  Between 7am to 6pm - Pager - [phone]  After 6pm go to [email or web address] - Proofreader  [facility]  Office  [phone]  CC: Primary care physician; [name], MD  Note: This dictation was prepared with Dragon dictation along with smaller phrase technology. Any transcriptional errors that result from this process are unintentional.

## 2017-12-02 NOTE — Progress Notes (Signed)
Initial Nutrition Assessment  DOCUMENTATION CODES:   Not applicable  INTERVENTION:   Magic cup TID with meals, each supplement provides 290 kcal and 9 grams of protein  Liberalize diet  Ocuvite daily for wound healing (provides zinc, vitamin A, vitamin C, Vitamin E, copper, and selenium)  NUTRITION DIAGNOSIS:   Increased nutrient needs related to wound healing, chronic illness(CHF, prostate cancer ) as evidenced by increased estimated needs from protein.   GOAL:   Patient will meet greater than or equal to 90% of their needs  MONITOR:   PO intake, Supplement acceptance, Labs, Weight trends, I & O's, Skin  REASON FOR ASSESSMENT:   Malnutrition Screening Tool    ASSESSMENT:   84 y.o. male with a known history of multiple medical problems including CAD, hypertension, hyperlipidemia, CKD, prostate cancer and a lung nodule.  The patient came to ED due to dizziness and leg swelling for 1 week.  He is found acute on chronic renal failure    Unable to see pt today x 2 visits. RD familiar with this patient from multiple previous admits the last of which was one month ago. Pt with intermittent poor appetite and oral intake at baseline. Per chart, pt with weight gain since last admit. Pt documented to be eating 50% of meals in hospital. Pt does not like supplements as he feels they cause him to be constipated. RD will liberalize diet and add supplements on trays. RD will obtain exam and nutrition related history at follow up.   Medications reviewed and include: aspirin, heparin, protonix, NaCl @75ml /hr  Labs reviewed: K 4.7 wnl, BUN 96(H), creat 3.62(H), Ca 8.3(L), uric acid 13.1(H) BNP 3281(H)- 4/21  Unable to complete Nutrition-Focused physical exam at this time.   Diet Order:  Diet Heart Room service appropriate? Yes; Fluid consistency: Thin  EDUCATION NEEDS:   No education needs have been identified at this time  Skin:  Skin Assessment: (Stage II coccyx)  Last BM:   4/21  Height:   Ht Readings from Last 1 Encounters:  12/01/17 5\' 9"  (1.753 m)    Weight:   Wt Readings from Last 1 Encounters:  12/01/17 163 lb (73.9 kg)    Ideal Body Weight:  72.7 kg  BMI:  Body mass index is 24.07 kg/m.  Estimated Nutritional Needs:   Kcal:  1700-2000kcal/day   Protein:  89-104g/day   Fluid:  >1.7L/day or per MD  Koleen Distance MS, RD, LDN Pager #[phone] After Hours Pager: [phone]

## 2017-12-02 NOTE — Progress Notes (Signed)
*  PRELIMINARY RESULTS* Echocardiogram 2D Echocardiogram has been performed.  Andrew [name] 12/02/2017, 11:08 AM

## 2017-12-02 NOTE — Consult Note (Signed)
[facility]                                                      Patient Demographics  Andrew [name], is a 84 y.o. male   MRN: [id number]   DOB - 26-Mar-1933  Admit Date - 12/01/2017    Outpatient Primary MD for the patient is [name], MD  Consult requested in the Hospital by Max Sane, MD, On 12/02/2017    Reason for consult pain in the left great toe   With History of -  Past Medical History:  Diagnosis Date  . Arteriosclerosis of coronary artery 01/27/2014  . Benign essential HTN 12/11/2013  . Carpal tunnel syndrome   . Coronary atherosclerosis of native coronary artery   . Disorder of mitral valve 12/11/2013  . Encounter for long-term (current) use of antiplatelets/antithrombotics   . Gout   . Hyperlipidemia   . Hypertension   . Lung nodule    found on xray in Aug 2018  . Mitral valve disorder   . Osteoarthritis   . Prostate cancer [facility])       Past Surgical History:  Procedure Laterality Date  . COLONOSCOPY    . COLONOSCOPY WITH PROPOFOL N/A 07/23/2016   Procedure: COLONOSCOPY WITH PROPOFOL;  Surgeon: Manya Silvas, MD;  Location: [facility] ENDOSCOPY;  Service: Endoscopy;  Laterality: N/A;  . CORONARY ANGIOPLASTY WITH STENT PLACEMENT    . ESOPHAGOGASTRODUODENOSCOPY Left 07/28/2017   Procedure: ESOPHAGOGASTRODUODENOSCOPY (EGD);  Surgeon: Virgel Manifold, MD;  Location: [facility] ENDOSCOPY;  Service: Endoscopy;  Laterality: Left;  . ESOPHAGOGASTRODUODENOSCOPY (EGD) WITH PROPOFOL N/A 07/23/2016   Procedure: ESOPHAGOGASTRODUODENOSCOPY (EGD) WITH PROPOFOL;  Surgeon: Manya Silvas, MD;  Location: [facility] ENDOSCOPY;  Service: Endoscopy;  Laterality: N/A;  . PROSTATE BIOPSY      in for   Chief Complaint  Patient presents with  . Dizziness  . Leg Swelling  . Shortness of Breath     HPI  Andrew [name]  is a 84 y.o. male, he states that  he is having pain in the left great toe for about a week may be a little more.  No history of injury is aware of.  He thought he saw some purulence coming out of the toe but today he states that the painful areas at the base of the toe at the joint level not in the nail region.    Review of Systems    In addition to the HPI above,  No Fever-chills, No Headache, No changes with Vision or hearing, No problems swallowing food or Liquids, No Chest pain, Cough or Shortness of Breath, No Abdominal pain, No Nausea or Vommitting, Bowel movements are regular, No Blood in stool or Urine, No dysuria, No new skin rashes or bruises, No new joints pains-aches,  No new weakness, tingling, numbness in any extremity, No recent weight gain or loss, No polyuria, polydypsia or polyphagia, No significant Mental Stressors.  A full 10 point Review of Systems was done, except as stated above, all other Review of Systems were negative.   Social History Social History   Tobacco Use  . Smoking status: Never Smoker  . Smokeless tobacco: Never Used  Substance Use Topics  . Alcohol use: No    Family History Family History  Problem Relation Age of Onset  . Cancer Mother  stomach and uterus  . Cancer Sister        breast    Prior to Admission medications   Medication Sig Start Date End Date Taking? Authorizing Provider  aspirin 325 MG tablet Take 325 mg by mouth daily.    Yes [provider]  atorvastatin (LIPITOR) 40 MG tablet Take 40 mg by mouth daily. 04/24/15  Yes [provider]  finasteride (PROSCAR) 5 MG tablet Take 5 mg by mouth daily. 04/13/15  Yes [provider]  loratadine (CLARITIN) 10 MG tablet Take 1 tablet (10 mg total) by mouth daily. 11/03/17  Yes Gouru, Illene Silver, MD  omeprazole (PRILOSEC) 20 MG capsule Take 1 capsule (20 mg total) by mouth 2 (two) times daily. 07/28/17 07/28/18 Yes Mody, Ulice Bold, MD  torsemide (DEMADEX) 20 MG tablet Take 2 tablets (40 mg  total) by mouth daily. 11/02/17  Yes Gouru, Illene Silver, MD    Anti-infectives (From admission, onward)   None      Scheduled Meds: . aspirin  325 mg Oral Daily  . atorvastatin  40 mg Oral Daily  . finasteride  5 mg Oral Daily  . heparin  5,000 Units Subcutaneous Q8H  . loratadine  10 mg Oral Daily  . pantoprazole  40 mg Oral Daily   Continuous Infusions: . sodium chloride 75 mL/hr at 12/02/17 0423   PRN Meds:.acetaminophen **OR** acetaminophen, albuterol, bisacodyl, HYDROcodone-acetaminophen, ondansetron **OR** ondansetron (ZOFRAN) IV, senna-docusate  No Known Allergies  Physical Exam  Vitals  Blood pressure 110/68, pulse 91, temperature 98 F (36.7 C), temperature source Oral, resp. rate 20, height 5\' 9"  (1.753 m), weight 73.9 kg (163 lb), SpO2 100 %.  Lower Extremity exam:  Vascular: Patient has edema of the bilateral feet and legs I cannot palpate a dorsalis pedis or posterior tibial pulse this point.  Toes are relatively cold  Dermatological: No ulcers or open wounds at this point.  There is little evidence that there may be an ingrown toenail at some point but does not look at best the case at this point.  Neurological: Within normal limits  Ortho: No gross deformities but the IP joint of the left hallux is sore to palpate has a little bit of swelling to it at this point.  Pain with palpation to the region as well range of motion the IP joint of the left hallux  Data Review  CBC Recent Labs  Lab 12/01/17 1004  WBC 7.6  HGB 9.6*  HCT 31.0*  PLT 332  MCV 71.8*  MCH 22.3*  MCHC 31.0*  RDW 20.9*   ------------------------------------------------------------------------------------------------------------------  Chemistries  Recent Labs  Lab 12/01/17 1004 12/02/17 0509  NA 135 137  K 5.0 4.7  CL 104 106  CO2 20* 21*  GLUCOSE 106* 108*  BUN 104* 96*  CREATININE 3.75* 3.62*  CALCIUM 8.8* 8.3*    ------------------------------------------------------------------------------------------------------------------ estimated creatinine clearance is 15.7 mL/min (A) (by C-G formula based on SCr of 3.62 mg/dL (H)). ------------------------------------------------------------------------------------------------------------------ No results for input(s): TSH, T4TOTAL, T3FREE, THYROIDAB in the last 72 hours.  Invalid input(s): FREET3 Urinalysis    Component Value Date/Time   COLORURINE YELLOW (A) 12/01/2017 2009   APPEARANCEUR CLEAR (A) 12/01/2017 2009   LABSPEC 1.012 12/01/2017 2009   PHURINE 5.0 12/01/2017 2009   GLUCOSEU NEGATIVE 12/01/2017 2009   HGBUR NEGATIVE 12/01/2017 2009   BILIRUBINUR NEGATIVE 12/01/2017 2009   [place] NEGATIVE 12/01/2017 2009   PROTEINUR NEGATIVE 12/01/2017 2009   NITRITE NEGATIVE 12/01/2017 2009   LEUKOCYTESUR NEGATIVE 12/01/2017 2009  Imaging results:   Dg Chest 2 View  Result Date: 12/01/2017 CLINICAL DATA:  Patient reports SOB, dizziness and bilateral LE swelling onset 2 days ago. Hx prostate ca, mitral valve disorder, lung nodule, HTN, cardiac stent placement. Non-smoker. EXAM: CHEST - 2 VIEW COMPARISON:  10/31/2017 FINDINGS: Heart is enlarged and stable in configuration. There no focal consolidations. Small RIGHT pleural effusion or pleural thickening persists and is smaller. No pulmonary edema. IMPRESSION: 1. Stable cardiomegaly. 2. Smaller RIGHT pleural effusion. Electronically Signed   By: Nolon Nations M.D.   On: 12/01/2017 11:06   US Renal  Result Date: 12/01/2017 CLINICAL DATA:  Renal failure, acute on chronic EXAM: RENAL / URINARY TRACT ULTRASOUND COMPLETE COMPARISON:  Ultrasound 04/09/2017.  MRI 05/28/2017 FINDINGS: Right Kidney: Length: 10.3 cm. Multiple cysts, the largest 7.2 cm in the lower pole with thin septations. No hydronephrosis. Diffusely increased echotexture Left Kidney: Length: 10.9 cm. Multiple cysts, the largest 15 mm in  the midpole. Increased echotexture. No hydronephrosis. Bladder: Slight wall irregularity and thickening.  Prostate enlargement. IMPRESSION: No hydronephrosis. Increased echotexture in the kidneys bilaterally compatible chronic medical renal disease. Bilateral benign appearing renal cysts. Mild bladder wall thickening, likely related to prostate enlargement. Electronically Signed   By: Rolm Baptise M.D.   On: 12/01/2017 17:22    Assessment & Plan: There is an MRI ordered for mostly what that looks like but I think the problem is more at the IP joint and also possibly vascular in nature I want to get a vascular consult to have them look at it and make sure there is no significant vascular disease that they feel may be going home.  He denies any intermittent claudication states he can walk about 200 feet before he gets too tired.  No clear evidence of any other process going on the toe.  May have an ingrown toenail but it looks stable at this point.  Active Problems:   Renal failure (ARF), acute on chronic (HCC)   Pressure injury of skin   Family Communication: Plan discussed with patient  Albertine Patricia M.D on 12/02/2017 at 1:07 PM  Thank you for the consult, we will follow the patient with you in the Hospital.

## 2017-12-02 NOTE — Consult Note (Signed)
[facility]  CONSULT NOTE    Date: 12/02/2017                  Patient Name:  Andrew [name]  MRN: [id number]  DOB: 1933/05/03  Age / Sex: 84 y.o., male         PCP: [name], MD                 Service Requesting Consult: Dr. Manuella Ghazi                 Reason for Consult: Acute renal failure            History of Present Illness: Andrew [name] is a 84 y.o. black male with hypertension, hyperlipidemia, gout, history of prostate cancer, coronary artery disease, who was admitted to [facility] on 12/01/2017 for Peripheral edema [R60.9] AKI (acute kidney injury) ([place]) [N17.9]  Patient complains of increasing peripheral and scrotal edema. Was seen last week by cardiology, Dr. Ubaldo Glassing, who increased patient's torsemide. Patient complains of left first toe pain.   Evaluated by podiatry and rheumatology. Uric acid elevated.    Medications: Outpatient medications: Medications Prior to Admission  Medication Sig Dispense Refill Last Dose  . aspirin 325 MG tablet Take 325 mg by mouth daily.    12/01/2017 at 0800  . atorvastatin (LIPITOR) 40 MG tablet Take 40 mg by mouth daily.  6 12/01/2017 at 0800  . finasteride (PROSCAR) 5 MG tablet Take 5 mg by mouth daily.  11 12/01/2017 at 0800  . loratadine (CLARITIN) 10 MG tablet Take 1 tablet (10 mg total) by mouth daily. 30 tablet 0 12/01/2017 at 0800  . omeprazole (PRILOSEC) 20 MG capsule Take 1 capsule (20 mg total) by mouth 2 (two) times daily. 60 capsule 1 12/01/2017 at 0800  . torsemide (DEMADEX) 20 MG tablet Take 2 tablets (40 mg total) by mouth daily. 60 tablet 0 12/01/2017 at 0800    Current medications: Current Facility-Administered Medications  Medication Dose Route Frequency Provider Last Rate Last Dose  . acetaminophen (TYLENOL) tablet 650 mg  650 mg Oral Q6H PRN Demetrios Loll, MD       Or  . acetaminophen (TYLENOL) suppository 650 mg  650 mg Rectal Q6H PRN Demetrios Loll, MD      . albuterol (PROVENTIL) (2.5 MG/3ML) 0.083%  nebulizer solution 2.5 mg  2.5 mg Nebulization Q2H PRN Demetrios Loll, MD      . aspirin tablet 325 mg  325 mg Oral Daily Demetrios Loll, MD   325 mg at 12/02/17 0920  . atorvastatin (LIPITOR) tablet 40 mg  40 mg Oral Daily Demetrios Loll, MD   40 mg at 12/02/17 0919  . bisacodyl (DULCOLAX) EC tablet 5 mg  5 mg Oral Daily PRN Demetrios Loll, MD      . finasteride (PROSCAR) tablet 5 mg  5 mg Oral Daily Demetrios Loll, MD   5 mg at 12/02/17 0919  . heparin injection 5,000 Units  5,000 Units Subcutaneous Q8H Demetrios Loll, MD   5,000 Units at 12/02/17 1543  . HYDROcodone-acetaminophen (NORCO/VICODIN) 5-325 MG per tablet 1-2 tablet  1-2 tablet Oral Q4H PRN Demetrios Loll, MD      . loratadine (CLARITIN) tablet 10 mg  10 mg Oral Daily Demetrios Loll, MD   10 mg at 12/02/17 0919  . multivitamin-lutein (OCUVITE-LUTEIN) capsule 1 capsule  1 capsule Oral Daily Manuella Ghazi, Vipul, MD      . ondansetron [facility]) tablet 4 mg  4  mg Oral Q6H PRN Demetrios Loll, MD       Or  . ondansetron [facility] injection 4 mg  4 mg Intravenous Q6H PRN Demetrios Loll, MD      . pantoprazole (PROTONIX) EC tablet 40 mg  40 mg Oral Daily Demetrios Loll, MD   40 mg at 12/02/17 0919  . senna-docusate (Senokot-S) tablet 1 tablet  1 tablet Oral QHS PRN Demetrios Loll, MD          Allergies: No Known Allergies    Past Medical History: Past Medical History:  Diagnosis Date  . Arteriosclerosis of coronary artery 01/27/2014  . Benign essential HTN 12/11/2013  . Carpal tunnel syndrome   . Coronary atherosclerosis of native coronary artery   . Disorder of mitral valve 12/11/2013  . Encounter for long-term (current) use of antiplatelets/antithrombotics   . Gout   . Hyperlipidemia   . Hypertension   . Lung nodule    found on xray in Aug 2018  . Mitral valve disorder   . Osteoarthritis   . Prostate cancer [facility])      Past Surgical History: Past Surgical History:  Procedure Laterality Date  . COLONOSCOPY    . COLONOSCOPY WITH PROPOFOL N/A 07/23/2016   Procedure: COLONOSCOPY  WITH PROPOFOL;  Surgeon: Manya Silvas, MD;  Location: [facility] ENDOSCOPY;  Service: Endoscopy;  Laterality: N/A;  . CORONARY ANGIOPLASTY WITH STENT PLACEMENT    . ESOPHAGOGASTRODUODENOSCOPY Left 07/28/2017   Procedure: ESOPHAGOGASTRODUODENOSCOPY (EGD);  Surgeon: Virgel Manifold, MD;  Location: [facility] ENDOSCOPY;  Service: Endoscopy;  Laterality: Left;  . ESOPHAGOGASTRODUODENOSCOPY (EGD) WITH PROPOFOL N/A 07/23/2016   Procedure: ESOPHAGOGASTRODUODENOSCOPY (EGD) WITH PROPOFOL;  Surgeon: Manya Silvas, MD;  Location: [facility] ENDOSCOPY;  Service: Endoscopy;  Laterality: N/A;  . PROSTATE BIOPSY       Family History: Family History  Problem Relation Age of Onset  . Cancer Mother        stomach and uterus  . Cancer Sister        breast     Social History: Social History   Socioeconomic History  . Marital status: Married    Spouse name: Not on file  . Number of children: Not on file  . Years of education: Not on file  . Highest education level: Not on file  Occupational History  . Not on file  Social Needs  . Financial resource strain: Not on file  . Food insecurity:    Worry: Not on file    Inability: Not on file  . Transportation needs:    Medical: Not on file    Non-medical: Not on file  Tobacco Use  . Smoking status: Never Smoker  . Smokeless tobacco: Never Used  Substance and Sexual Activity  . Alcohol use: No  . Drug use: No  . Sexual activity: Never  Lifestyle  . Physical activity:    Days per week: Not on file    Minutes per session: Not on file  . Stress: Not on file  Relationships  . Social connections:    Talks on phone: Not on file    Gets together: Not on file    Attends religious service: Not on file    Active member of club or organization: Not on file    Attends meetings of clubs or organizations: Not on file    Relationship status: Not on file  . Intimate partner violence:    Fear of current or ex partner: Not on file    Emotionally abused:  Not  on file    Physically abused: Not on file    Forced sexual activity: Not on file  Other Topics Concern  . Not on file  Social History Narrative  . Not on file     Review of Systems: Review of Systems  Constitutional: Positive for malaise/fatigue and weight loss. Negative for chills, diaphoresis and fever.  HENT: Negative.  Negative for congestion, ear discharge, ear pain, hearing loss, nosebleeds, sinus pain, sore throat and tinnitus.   Eyes: Negative.  Negative for blurred vision, double vision, photophobia, pain, discharge and redness.  Respiratory: Positive for shortness of breath. Negative for cough, hemoptysis, sputum production, wheezing and stridor.   Cardiovascular: Positive for orthopnea, claudication and leg swelling. Negative for chest pain, palpitations and PND.  Gastrointestinal: Negative for abdominal pain, blood in stool, constipation, diarrhea, heartburn, melena, nausea and vomiting.  Genitourinary: Positive for dysuria. Negative for flank pain, frequency, hematuria and urgency.  Musculoskeletal: Negative for back pain, falls, joint pain, myalgias and neck pain.  Skin: Negative.  Negative for itching and rash.  Neurological: Negative for dizziness, tingling, tremors, sensory change, speech change, focal weakness, seizures, loss of consciousness, weakness and headaches.  Endo/Heme/Allergies: Negative for environmental allergies and polydipsia. Does not bruise/bleed easily.  Psychiatric/Behavioral: Negative for depression, hallucinations, memory loss, substance abuse and suicidal ideas. The patient is not nervous/anxious and does not have insomnia.     Vital Signs: Blood pressure 102/76, pulse 89, temperature 98.2 F (36.8 C), resp. rate 20, height 5\' 9"  (1.753 m), weight 73.9 kg (163 lb), SpO2 100 %.  Weight trends: Filed Weights   12/01/17 1002  Weight: 73.9 kg (163 lb)    Physical Exam: General: NAD, sitting on edge of bed  Head: Normocephalic, atraumatic. Moist  oral mucosal membranes  Eyes: Anicteric, PERRL  Neck: Supple, trachea midline  Lungs:  Clear to auscultation  Heart: Regular rate and rhythm  Abdomen:  Soft, nontender,   Extremities:  3+ peripheral edema.  Neurologic: Nonfocal, moving all four extremities  Skin: No lesions        Lab results: Basic Metabolic Panel: Recent Labs  Lab 12/01/17 1004 12/02/17 0509  NA 135 137  K 5.0 4.7  CL 104 106  CO2 20* 21*  GLUCOSE 106* 108*  BUN 104* 96*  CREATININE 3.75* 3.62*  CALCIUM 8.8* 8.3*    Liver Function Tests: No results for input(s): AST, ALT, ALKPHOS, BILITOT, PROT, ALBUMIN in the last 168 hours. No results for input(s): LIPASE, AMYLASE in the last 168 hours. No results for input(s): AMMONIA in the last 168 hours.  CBC: Recent Labs  Lab 12/01/17 1004  WBC 7.6  HGB 9.6*  HCT 31.0*  MCV 71.8*  PLT 332    Cardiac Enzymes: Recent Labs  Lab 12/01/17 1004  TROPONINI 0.05*    BNP: Invalid input(s): POCBNP  CBG: No results for input(s): GLUCAP in the last 168 hours.  Microbiology: No results found for this or any previous visit.  Coagulation Studies: No results for input(s): LABPROT, INR in the last 72 hours.  Urinalysis: Recent Labs    12/01/17 2009  COLORURINE YELLOW*  LABSPEC 1.012  PHURINE 5.0  GLUCOSEU NEGATIVE  HGBUR NEGATIVE  BILIRUBINUR NEGATIVE  KETONESUR NEGATIVE  PROTEINUR NEGATIVE  NITRITE NEGATIVE  LEUKOCYTESUR NEGATIVE      Imaging: Dg Chest 2 View  Result Date: 12/01/2017 CLINICAL DATA:  Patient reports SOB, dizziness and bilateral LE swelling onset 2 days ago. Hx prostate ca, mitral valve disorder, lung  nodule, HTN, cardiac stent placement. Non-smoker. EXAM: CHEST - 2 VIEW COMPARISON:  10/31/2017 FINDINGS: Heart is enlarged and stable in configuration. There no focal consolidations. Small RIGHT pleural effusion or pleural thickening persists and is smaller. No pulmonary edema. IMPRESSION: 1. Stable cardiomegaly. 2. Smaller  RIGHT pleural effusion. Electronically Signed   By: Nolon Nations M.D.   On: 12/01/2017 11:06   US Renal  Result Date: 12/01/2017 CLINICAL DATA:  Renal failure, acute on chronic EXAM: RENAL / URINARY TRACT ULTRASOUND COMPLETE COMPARISON:  Ultrasound 04/09/2017.  MRI 05/28/2017 FINDINGS: Right Kidney: Length: 10.3 cm. Multiple cysts, the largest 7.2 cm in the lower pole with thin septations. No hydronephrosis. Diffusely increased echotexture Left Kidney: Length: 10.9 cm. Multiple cysts, the largest 15 mm in the midpole. Increased echotexture. No hydronephrosis. Bladder: Slight wall irregularity and thickening.  Prostate enlargement. IMPRESSION: No hydronephrosis. Increased echotexture in the kidneys bilaterally compatible chronic medical renal disease. Bilateral benign appearing renal cysts. Mild bladder wall thickening, likely related to prostate enlargement. Electronically Signed   By: Rolm Baptise M.D.   On: 12/01/2017 17:22   US Venous Img Lower Bilateral  Result Date: 12/02/2017 CLINICAL DATA:  Swelling in both legs, pain, history prostate cancer EXAM: BILATERAL LOWER EXTREMITY VENOUS DOPPLER ULTRASOUND TECHNIQUE: Gray-scale sonography with graded compression, as well as color Doppler and duplex ultrasound were performed to evaluate the lower extremity deep venous systems from the level of the common femoral vein and including the common femoral, femoral, profunda femoral, popliteal and calf veins including the posterior tibial, peroneal and gastrocnemius veins when visible. The superficial great saphenous vein was also interrogated. Spectral Doppler was utilized to evaluate flow at rest and with distal augmentation maneuvers in the common femoral, femoral and popliteal veins. COMPARISON:  None. FINDINGS: RIGHT LOWER EXTREMITY Common Femoral Vein: No evidence of thrombus. Normal compressibility, respiratory phasicity and response to augmentation. Saphenofemoral Junction: No evidence of thrombus.  Normal compressibility and flow on color Doppler imaging. Profunda Femoral Vein: No evidence of thrombus. Normal compressibility and flow on color Doppler imaging. Femoral Vein: No evidence of thrombus. Normal compressibility, respiratory phasicity and response to augmentation. Popliteal Vein: No evidence of thrombus. Normal compressibility, respiratory phasicity and response to augmentation. Calf Veins: No evidence of thrombus. Normal compressibility and flow on color Doppler imaging. Superficial Great Saphenous Vein: No evidence of thrombus. Normal compressibility. Venous Reflux:  None. Other Findings:  None. LEFT LOWER EXTREMITY Common Femoral Vein: No evidence of thrombus. Normal compressibility, respiratory phasicity and response to augmentation. Saphenofemoral Junction: No evidence of thrombus. Normal compressibility and flow on color Doppler imaging. Profunda Femoral Vein: No evidence of thrombus. Normal compressibility and flow on color Doppler imaging. Femoral Vein: No evidence of thrombus. Normal compressibility, respiratory phasicity and response to augmentation. Popliteal Vein: No evidence of thrombus. Normal compressibility, respiratory phasicity and response to augmentation. Calf Veins: No evidence of thrombus. Normal compressibility and flow on color Doppler imaging. Superficial Great Saphenous Vein: No evidence of thrombus. Normal compressibility. Venous Reflux:  None. Other Findings:  None. IMPRESSION: No evidence of deep venous thrombosis in either lower extremity. Electronically Signed   By: Lavonia Dana M.D.   On: 12/02/2017 14:15      Assessment & Plan: Andrew [name] is a 84 y.o. black male with hypertension, hyperlipidemia, gout, history of prostate cancer, coronary artery disease, who was admitted to [facility] on 12/01/2017 for Peripheral edema [R60.9] AKI (acute kidney injury) ([place]) [N17.9]  1. Acute renal failure on chronic kidney disease stage  III with bland urinalysis. Baseline  creatinine of 2.1, GFR of 37 on 10/15/17. Progressive renal failure in the last month.  - Discontinue IV fluids - Start patient on IV furosemide gtt.   2. Hypertension: with echocardiogram pending. Significant peripheral edema. Home regimen of torsemide.  - IV furosemide gtt as above.   3. Gout: with hyperuricemia.  - Appreciate rheumatology input - Agree with radiographic imaging and restarting low dose allopurinol.   4. Anemia with chronic kidney disease: hemoglobin 9.6, microcytic. Iron studies in 12/18 acceptable.     LOS: 1 Ashlynd Michna 4/22/20194:00 PM

## 2017-12-02 NOTE — Progress Notes (Signed)
Family Meeting Note  Advance Directive:yes  Today a meeting took place with the Patient.  The following clinical team members were present during this meeting:MD  The following were discussed:Patient's diagnosis:   84 y.o.malewith a known history of multiple medical problemsincluding CAD, hypertension, AOCKD, hyperlipidemia, CKD 3, prostate cancer and a lung nodule. admitted dizziness and leg swelling   Patient's progosis: > 12 months and Goals for treatment: DNR  Additional follow-up to be provided: Home with HH, PT, RN and Palliative care to follow   Time spent during discussion:20 minutes  Max Sane, MD

## 2017-12-02 NOTE — Consult Note (Signed)
[facility] Vascular Consult Note  MRN : [id number]  Andrew [name] is a 84 y.o. ([birth year]) male who presents with chief complaint of  Chief Complaint  Patient presents with  . Dizziness  . Leg Swelling  . Shortness of Breath  .  History of Present Illness: I am asked to see the patient by Dr. Elvina Mattes to evaluate his left lower extremity perfusion.  He was admitted with left foot and toe pain over the past couple of weeks.  He is not a great historian.  He has had significantly worsening peripheral edema and had his torsemide increased as an outpatient.  He has had worsening renal function and on admission his creatinine clearance was below 15.  His baseline creatinine is about 2.1 with a GFR in the 30s earlier this year.  His left foot pain has been severe.  It has been unrelenting.  Rheumatology has seen the patient and there is consideration for gout as a possible diagnosis, but there is also concerned about malperfusion.  His pedal pulses are not easy to feel particularly with his swelling.  Podiatry has evaluated the patient and is concerned about his perfusion as well.  He has not really had previous claudication although he does not really seem to walk all that much.  No history of previous ulceration.  No fevers or chills.  Current Facility-Administered Medications  Medication Dose Route Frequency Provider Last Rate Last Dose  . acetaminophen (TYLENOL) tablet 650 mg  650 mg Oral Q6H PRN Demetrios Loll, MD       Or  . acetaminophen (TYLENOL) suppository 650 mg  650 mg Rectal Q6H PRN Demetrios Loll, MD      . albuterol (PROVENTIL) (2.5 MG/3ML) 0.083% nebulizer solution 2.5 mg  2.5 mg Nebulization Q2H PRN Demetrios Loll, MD      . aspirin tablet 325 mg  325 mg Oral Daily Demetrios Loll, MD   325 mg at 12/02/17 0920  . atorvastatin (LIPITOR) tablet 40 mg  40 mg Oral Daily Demetrios Loll, MD   40 mg at 12/02/17 0919  . bisacodyl (DULCOLAX) EC tablet 5 mg  5 mg Oral Daily PRN Demetrios Loll, MD      . finasteride (PROSCAR) tablet 5 mg  5 mg Oral Daily Demetrios Loll, MD   5 mg at 12/02/17 0919  . furosemide (LASIX) 250 mg in dextrose 5 % 250 mL (1 mg/mL) infusion  4 mg/hr Intravenous Continuous Kolluru, Sarath, MD      . heparin injection 5,000 Units  5,000 Units Subcutaneous Q8H Demetrios Loll, MD   5,000 Units at 12/02/17 1543  . HYDROcodone-acetaminophen (NORCO/VICODIN) 5-325 MG per tablet 1-2 tablet  1-2 tablet Oral Q4H PRN Demetrios Loll, MD      . loratadine (CLARITIN) tablet 10 mg  10 mg Oral Daily Demetrios Loll, MD   10 mg at 12/02/17 0919  . multivitamin-lutein (OCUVITE-LUTEIN) capsule 1 capsule  1 capsule Oral Daily Max Sane, MD      . ondansetron [facility]) tablet 4 mg  4 mg Oral Q6H PRN Demetrios Loll, MD       Or  . ondansetron [facility]) injection 4 mg  4 mg Intravenous Q6H PRN Demetrios Loll, MD      . pantoprazole (PROTONIX) EC tablet 40 mg  40 mg Oral Daily Demetrios Loll, MD   40 mg at 12/02/17 0919  . senna-docusate (Senokot-S) tablet 1 tablet  1 tablet Oral QHS PRN Demetrios Loll, MD  Past Medical History:  Diagnosis Date  . Arteriosclerosis of coronary artery 01/27/2014  . Benign essential HTN 12/11/2013  . Carpal tunnel syndrome   . Coronary atherosclerosis of native coronary artery   . Disorder of mitral valve 12/11/2013  . Encounter for long-term (current) use of antiplatelets/antithrombotics   . Gout   . Hyperlipidemia   . Hypertension   . Lung nodule    found on xray in Aug 2018  . Mitral valve disorder   . Osteoarthritis   . Prostate cancer [facility])     Past Surgical History:  Procedure Laterality Date  . COLONOSCOPY    . COLONOSCOPY WITH PROPOFOL N/A 07/23/2016   Procedure: COLONOSCOPY WITH PROPOFOL;  Surgeon: Manya Silvas, MD;  Location: [facility] ENDOSCOPY;  Service: Endoscopy;  Laterality: N/A;  . CORONARY ANGIOPLASTY WITH STENT PLACEMENT    . ESOPHAGOGASTRODUODENOSCOPY Left 07/28/2017   Procedure: ESOPHAGOGASTRODUODENOSCOPY (EGD);  Surgeon: Virgel Manifold,  MD;  Location[facility] ENDOSCOPY;  Service: Endoscopy;  Laterality: Left;  . ESOPHAGOGASTRODUODENOSCOPY (EGD) WITH PROPOFOL N/A 07/23/2016   Procedure: ESOPHAGOGASTRODUODENOSCOPY (EGD) WITH PROPOFOL;  Surgeon: Manya Silvas, MD;  Location: [facility] ENDOSCOPY;  Service: Endoscopy;  Laterality: N/A;  . PROSTATE BIOPSY      Social History Social History   Tobacco Use  . Smoking status: Never Smoker  . Smokeless tobacco: Never Used  Substance Use Topics  . Alcohol use: No  . Drug use: No    Family History Family History  Problem Relation Age of Onset  . Cancer Mother        stomach and uterus  . Cancer Sister        breast  No bleeding disorders or clotting disorders  No Known Allergies   REVIEW OF SYSTEMS (Negative unless checked)  Constitutional: [] Weight loss  [] Fever  [] Chills Cardiac: [] Chest pain   [] Chest pressure   [x] Palpitations   [] Shortness of breath when laying flat   [] Shortness of breath at rest   [] Shortness of breath with exertion. Vascular:  [] Pain in legs with walking   [] Pain in legs at rest   [] Pain in legs when laying flat   [] Claudication   [] Pain in feet when walking  [] Pain in feet at rest  [] Pain in feet when laying flat   [] History of DVT   [] Phlebitis   [x] Swelling in legs   [] Varicose veins   [] Non-healing ulcers Pulmonary:   [] Uses home oxygen   [] Productive cough   [] Hemoptysis   [] Wheeze  [] COPD   [] Asthma Neurologic:  [] Dizziness  [] Blackouts   [] Seizures   [] History of stroke   [] History of TIA  [] Aphasia   [] Temporary blindness   [] Dysphagia   [] Weakness or numbness in arms   [] Weakness or numbness in legs Musculoskeletal:  [x] Arthritis   [x] Joint swelling   [x] Joint pain   [] Low back pain Hematologic:  [] Easy bruising  [] Easy bleeding   [] Hypercoagulable state   [x] Anemic  [] Hepatitis Gastrointestinal:  [] Blood in stool   [] Vomiting blood  [] Gastroesophageal reflux/heartburn   [] Difficulty swallowing. Genitourinary:  [x] Chronic kidney disease    [] Difficult urination  [] Frequent urination  [] Burning with urination   [] Blood in urine Skin:  [] Rashes   [] Ulcers   [] Wounds Psychological:  [] History of anxiety   []  History of major depression.  Physical Examination  Vitals:   12/01/17 1454 12/01/17 2019 12/02/17 0556 12/02/17 1417  BP: 106/69 106/81 110/68 102/76  Pulse: 81 94 91 89  Resp:  20 20   Temp: 97.7 F (  36.5 C) 97.6 F (36.4 C) 98 F (36.7 C) 98.2 F (36.8 C)  TempSrc:  Oral Oral   SpO2: 98% 95% 100% 100%  Weight:      Height:       Body mass index is 24.07 kg/m. Gen:  WD/WN, NAD. Appears younger than stated age. Head: [place]/AT, No temporalis wasting Ear/Nose/Throat: Hearing grossly intact, nares w/o erythema or drainage, oropharynx w/o Erythema/Exudate Eyes: Sclera non-icteric, conjunctiva clear Neck: Trachea midline.  No JVD.  Pulmonary:  Good air movement, respirations not labored, equal bilaterally.  Cardiac: RRR, normal S1, S2. Vascular:  Vessel Right Left  Radial Palpable Palpable                          PT  not palpable  not palpable  DP  1+ palpable  1+ palpable    Musculoskeletal: M/S 5/5 throughout.  Feet are warm bilaterally with good capillary refill.  2+ right lower extremity edema and 2-3+ left lower extremity edema limits palpation of pulses.  Left great toe is tender to palpation. Neurologic: Sensation grossly intact in extremities.  Symmetrical.  Speech is fluent. Motor exam as listed above. Psychiatric: Judgment intact, Mood & affect appropriate for pt's clinical situation. Dermatologic: No rashes or ulcers noted.  No cellulitis or open wounds.       CBC Lab Results  Component Value Date   WBC 7.6 12/01/2017   HGB 9.6 (L) 12/01/2017   HCT 31.0 (L) 12/01/2017   MCV 71.8 (L) 12/01/2017   PLT 332 12/01/2017    BMET    Component Value Date/Time   NA 137 12/02/2017 0509   NA 139 07/09/2013 0410   K 4.7 12/02/2017 0509   K 3.6 07/09/2013 0410   CL 106 12/02/2017 0509   CL  110 (H) 07/09/2013 0410   CO2 21 (L) 12/02/2017 0509   CO2 23 07/09/2013 0410   GLUCOSE 108 (H) 12/02/2017 0509   GLUCOSE 89 07/09/2013 0410   BUN 96 (H) 12/02/2017 0509   BUN 12 07/09/2013 0410   CREATININE 3.62 (H) 12/02/2017 0509   CREATININE 1.09 07/09/2013 0410   CALCIUM 8.3 (L) 12/02/2017 0509   CALCIUM 8.3 (L) 07/09/2013 0410   GFRNONAA 14 (L) 12/02/2017 0509   GFRNONAA >60 07/09/2013 0410   GFRAA 17 (L) 12/02/2017 0509   GFRAA >60 07/09/2013 0410   Estimated Creatinine Clearance: 15.7 mL/min (A) (by C-G formula based on SCr of 3.62 mg/dL (H)).  COAG Lab Results  Component Value Date   INR 1.06 07/28/2017    Radiology Dg Chest 2 View  Result Date: 12/01/2017 CLINICAL DATA:  Patient reports SOB, dizziness and bilateral LE swelling onset 2 days ago. Hx prostate ca, mitral valve disorder, lung nodule, HTN, cardiac stent placement. Non-smoker. EXAM: CHEST - 2 VIEW COMPARISON:  10/31/2017 FINDINGS: Heart is enlarged and stable in configuration. There no focal consolidations. Small RIGHT pleural effusion or pleural thickening persists and is smaller. No pulmonary edema. IMPRESSION: 1. Stable cardiomegaly. 2. Smaller RIGHT pleural effusion. Electronically Signed   By: Nolon Nations M.D.   On: 12/01/2017 11:06   US Renal  Result Date: 12/01/2017 CLINICAL DATA:  Renal failure, acute on chronic EXAM: RENAL / URINARY TRACT ULTRASOUND COMPLETE COMPARISON:  Ultrasound 04/09/2017.  MRI 05/28/2017 FINDINGS: Right Kidney: Length: 10.3 cm. Multiple cysts, the largest 7.2 cm in the lower pole with thin septations. No hydronephrosis. Diffusely increased echotexture Left Kidney: Length: 10.9 cm. Multiple cysts, the  largest 15 mm in the midpole. Increased echotexture. No hydronephrosis. Bladder: Slight wall irregularity and thickening.  Prostate enlargement. IMPRESSION: No hydronephrosis. Increased echotexture in the kidneys bilaterally compatible chronic medical renal disease. Bilateral benign  appearing renal cysts. Mild bladder wall thickening, likely related to prostate enlargement. Electronically Signed   By: Rolm Baptise M.D.   On: 12/01/2017 17:22   US Venous Img Lower Bilateral  Result Date: 12/02/2017 CLINICAL DATA:  Swelling in both legs, pain, history prostate cancer EXAM: BILATERAL LOWER EXTREMITY VENOUS DOPPLER ULTRASOUND TECHNIQUE: Gray-scale sonography with graded compression, as well as color Doppler and duplex ultrasound were performed to evaluate the lower extremity deep venous systems from the level of the common femoral vein and including the common femoral, femoral, profunda femoral, popliteal and calf veins including the posterior tibial, peroneal and gastrocnemius veins when visible. The superficial great saphenous vein was also interrogated. Spectral Doppler was utilized to evaluate flow at rest and with distal augmentation maneuvers in the common femoral, femoral and popliteal veins. COMPARISON:  None. FINDINGS: RIGHT LOWER EXTREMITY Common Femoral Vein: No evidence of thrombus. Normal compressibility, respiratory phasicity and response to augmentation. Saphenofemoral Junction: No evidence of thrombus. Normal compressibility and flow on color Doppler imaging. Profunda Femoral Vein: No evidence of thrombus. Normal compressibility and flow on color Doppler imaging. Femoral Vein: No evidence of thrombus. Normal compressibility, respiratory phasicity and response to augmentation. Popliteal Vein: No evidence of thrombus. Normal compressibility, respiratory phasicity and response to augmentation. Calf Veins: No evidence of thrombus. Normal compressibility and flow on color Doppler imaging. Superficial Great Saphenous Vein: No evidence of thrombus. Normal compressibility. Venous Reflux:  None. Other Findings:  None. LEFT LOWER EXTREMITY Common Femoral Vein: No evidence of thrombus. Normal compressibility, respiratory phasicity and response to augmentation. Saphenofemoral Junction: No  evidence of thrombus. Normal compressibility and flow on color Doppler imaging. Profunda Femoral Vein: No evidence of thrombus. Normal compressibility and flow on color Doppler imaging. Femoral Vein: No evidence of thrombus. Normal compressibility, respiratory phasicity and response to augmentation. Popliteal Vein: No evidence of thrombus. Normal compressibility, respiratory phasicity and response to augmentation. Calf Veins: No evidence of thrombus. Normal compressibility and flow on color Doppler imaging. Superficial Great Saphenous Vein: No evidence of thrombus. Normal compressibility. Venous Reflux:  None. Other Findings:  None. IMPRESSION: No evidence of deep venous thrombosis in either lower extremity. Electronically Signed   By: Lavonia Dana M.D.   On: 12/02/2017 14:15      Assessment/Plan 1.  Left foot pain.  Possible gout and rheumatology is following.  There is concern for malperfusion and in this situation we will often perform an angiogram.  Given his significant renal dysfunction, I would not perform an angiogram immediately and would not consider that this week and less his renal function improved.  Noninvasive studies in our institution have been lacking and not sure how much benefit that would be.  If his renal function significantly improves, we could consider an angiogram with a limited amount of contrast later this week.  Otherwise, would consider outpatient workup.  He did have a DVT study which was negative for DVT. 2.  Acute renal failure on top of stage III chronic kidney disease.  Creatinine clearance is significantly reduced.  Continue to avoid nephrotoxins.  Nephrology is following 3.  Hypertension.  Significant peripheral edema.  Likely an underlying cause of his kidney issues as well.  Stable pressure at this time.   Leotis Pain, MD  12/02/2017 4:44 PM  This note was created with Dragon medical transcription system.  Any error is purely unintentional

## 2017-12-03 ENCOUNTER — Encounter (INDEPENDENT_AMBULATORY_CARE_PROVIDER_SITE_OTHER): Payer: Medicare Other | Admitting: Vascular Surgery

## 2017-12-03 LAB — COMPREHENSIVE METABOLIC PANEL
ALBUMIN: 2.8 g/dL — AB (ref 3.5–5.0)
ALT: 30 U/L (ref 17–63)
AST: 24 U/L (ref 15–41)
Alkaline Phosphatase: 163 U/L — ABNORMAL HIGH (ref 38–126)
Anion gap: 8 (ref 5–15)
BUN: 95 mg/dL — ABNORMAL HIGH (ref 6–20)
CHLORIDE: 108 mmol/L (ref 101–111)
CO2: 22 mmol/L (ref 22–32)
Calcium: 8.3 mg/dL — ABNORMAL LOW (ref 8.9–10.3)
Creatinine, Ser: 3.42 mg/dL — ABNORMAL HIGH (ref 0.61–1.24)
GFR calc non Af Amer: 15 mL/min — ABNORMAL LOW (ref >60)
GFR, EST AFRICAN AMERICAN: 18 mL/min — AB (ref >60)
GLUCOSE: 99 mg/dL (ref 65–99)
Potassium: 4.7 mmol/L (ref 3.5–5.1)
SODIUM: 138 mmol/L (ref 135–145)
Total Bilirubin: 2 mg/dL — ABNORMAL HIGH (ref 0.3–1.2)
Total Protein: 5.5 g/dL — ABNORMAL LOW (ref 6.5–8.1)

## 2017-12-03 LAB — CBC
HCT: 29.9 % — ABNORMAL LOW (ref 40.0–52.0)
HEMOGLOBIN: 9.3 g/dL — AB (ref 13.0–18.0)
MCH: 22.1 pg — AB (ref 26.0–34.0)
MCHC: 30.9 g/dL — ABNORMAL LOW (ref 32.0–36.0)
MCV: 71.5 fL — AB (ref 80.0–100.0)
Platelets: 307 10*3/uL (ref 150–440)
RBC: 4.18 MIL/uL — AB (ref 4.40–5.90)
RDW: 21.1 % — ABNORMAL HIGH (ref 11.5–14.5)
WBC: 7.8 10*3/uL (ref 3.8–10.6)

## 2017-12-03 MED ORDER — I-VITE PO TABS
1.0000 | ORAL_TABLET | Freq: Every day | ORAL | Status: DC
  Administered 2017-12-03 – 2017-12-11 (×8): 1 via ORAL
  Filled 2017-12-03 (×12): qty 1

## 2017-12-03 NOTE — Progress Notes (Signed)
[facility]  ROUNDING NOTE   Subjective:   Feeling somewhat better.   UOP 766mL  Objective:  Vital signs in last 24 hours:  Temp:  [97.7 F (36.5 C)-98.2 F (36.8 C)] 97.7 F (36.5 C) (04/23 0500) Pulse Rate:  [89-97] 97 (04/23 1318) Resp:  [18-20] 18 (04/23 1318) BP: (99-106)/(67-76) 99/67 (04/23 1318) SpO2:  [98 %-100 %] 100 % (04/23 1318)  Weight change:  Filed Weights   12/01/17 1002  Weight: 73.9 kg (163 lb)    Intake/Output: I/O last 3 completed shifts: In: 1663 [P.O.:1140; I.V.:523] Out: 935 [Urine:935]   Intake/Output this shift:  Total I/O In: 0  Out: 200 [Urine:200]  Physical Exam: General: NAD,   Head: Normocephalic, atraumatic. Moist oral mucosal membranes  Eyes: Anicteric, PERRL  Neck: Supple, trachea midline  Lungs:  Clear to auscultation  Heart: Regular rate and rhythm  Abdomen:  Soft, nontender,   Extremities:  ++ peripheral edema.  Neurologic: Nonfocal, moving all four extremities  Skin: No lesions        Basic Metabolic Panel: Recent Labs  Lab 12/01/17 1004 12/02/17 0509 12/03/17 0559  NA 135 137 138  K 5.0 4.7 4.7  CL 104 106 108  CO2 20* 21* 22  GLUCOSE 106* 108* 99  BUN 104* 96* 95*  CREATININE 3.75* 3.62* 3.42*  CALCIUM 8.8* 8.3* 8.3*    Liver Function Tests: Recent Labs  Lab 12/03/17 0559  AST 24  ALT 30  ALKPHOS 163*  BILITOT 2.0*  PROT 5.5*  ALBUMIN 2.8*   No results for input(s): LIPASE, AMYLASE in the last 168 hours. No results for input(s): AMMONIA in the last 168 hours.  CBC: Recent Labs  Lab 12/01/17 1004 12/03/17 0559  WBC 7.6 7.8  HGB 9.6* 9.3*  HCT 31.0* 29.9*  MCV 71.8* 71.5*  PLT 332 307    Cardiac Enzymes: Recent Labs  Lab 12/01/17 1004  TROPONINI 0.05*    BNP: Invalid input(s): POCBNP  CBG: No results for input(s): GLUCAP in the last 168 hours.  Microbiology: No results found for this or any previous visit.  Coagulation Studies: No results for input(s):  LABPROT, INR in the last 72 hours.  Urinalysis: Recent Labs    12/01/17 2009  COLORURINE YELLOW*  LABSPEC 1.012  PHURINE 5.0  GLUCOSEU NEGATIVE  HGBUR NEGATIVE  BILIRUBINUR NEGATIVE  KETONESUR NEGATIVE  PROTEINUR NEGATIVE  NITRITE NEGATIVE  LEUKOCYTESUR NEGATIVE      Imaging: US Renal  Result Date: 12/01/2017 CLINICAL DATA:  Renal failure, acute on chronic EXAM: RENAL / URINARY TRACT ULTRASOUND COMPLETE COMPARISON:  Ultrasound 04/09/2017.  MRI 05/28/2017 FINDINGS: Right Kidney: Length: 10.3 cm. Multiple cysts, the largest 7.2 cm in the lower pole with thin septations. No hydronephrosis. Diffusely increased echotexture Left Kidney: Length: 10.9 cm. Multiple cysts, the largest 15 mm in the midpole. Increased echotexture. No hydronephrosis. Bladder: Slight wall irregularity and thickening.  Prostate enlargement. IMPRESSION: No hydronephrosis. Increased echotexture in the kidneys bilaterally compatible chronic medical renal disease. Bilateral benign appearing renal cysts. Mild bladder wall thickening, likely related to prostate enlargement. Electronically Signed   By: Rolm Baptise M.D.   On: 12/01/2017 17:22   Mr Foot Left Wo Contrast  Result Date: 12/03/2017 CLINICAL DATA:  Left foot and toe pain over the past few weeks. EXAM: MRI OF THE LEFT FOOT WITHOUT CONTRAST TECHNIQUE: Multiplanar, multisequence MR imaging of the left mid and forefoot was performed. No intravenous contrast was administered. COMPARISON:  Same day great toe radiographs. FINDINGS:  Bones/Joint/Cartilage Osteoarthritis of the interphalangeal, first MTP and TMT joints of the great toe. Associated subchondral degenerative cystic change is noted involving the base and head of the first metatarsal and across the interphalangeal joint of the great toe. Lesser degrees of degenerative joint space narrowing of the DIP and PIP joints of the second through fifth digits. Areas of minimal edema involving the tuft of the great toe and  cortical destruction of the middle phalanx of the second toe with minimal edema raise concern for early changes of osteomyelitis versus erosive changes possibly from an inflammatory arthropathy. Ligaments Noncontributory Muscles and Tendons No intramuscular fluid collection or abscess. No significant muscle atrophy. No evidence of a tenosynovitis. The tarsal Soft tissues Soft tissue edema and swelling is noted along the dorsum of the mid and forefoot. No focal fluid collections are identified. IMPRESSION: 1. Osteoarthritis of the forefoot as above more notably involving the interphalangeal, first MTP and TMT joints with joint space narrowing and subchondral cystic change. 2. Lesser degrees of osteoarthritic joint space narrowing is seen of the DIP and PIP joints of the second through fifth digits and fourth TMT joints. 3. Faint marrow edema involving the tuft of the great toe cannot exclude posttraumatic or reactive edema versus early changes of an osteomyelitis. 4. Cortical irregularity of the second middle phalangeal head with slight marrow edema also raise concern for changes of osteomyelitis although changes from inflammatory arthropathy may also have a similar appearance. Correlate with dedicated foot and/or toe radiographs. 5. Soft tissue swelling over the dorsum of the included foot. Electronically Signed   By: Ashley Royalty M.D.   On: 12/03/2017 00:31   US Venous Img Lower Bilateral  Result Date: 12/02/2017 CLINICAL DATA:  Swelling in both legs, pain, history prostate cancer EXAM: BILATERAL LOWER EXTREMITY VENOUS DOPPLER ULTRASOUND TECHNIQUE: Gray-scale sonography with graded compression, as well as color Doppler and duplex ultrasound were performed to evaluate the lower extremity deep venous systems from the level of the common femoral vein and including the common femoral, femoral, profunda femoral, popliteal and calf veins including the posterior tibial, peroneal and gastrocnemius veins when visible.  The superficial great saphenous vein was also interrogated. Spectral Doppler was utilized to evaluate flow at rest and with distal augmentation maneuvers in the common femoral, femoral and popliteal veins. COMPARISON:  None. FINDINGS: RIGHT LOWER EXTREMITY Common Femoral Vein: No evidence of thrombus. Normal compressibility, respiratory phasicity and response to augmentation. Saphenofemoral Junction: No evidence of thrombus. Normal compressibility and flow on color Doppler imaging. Profunda Femoral Vein: No evidence of thrombus. Normal compressibility and flow on color Doppler imaging. Femoral Vein: No evidence of thrombus. Normal compressibility, respiratory phasicity and response to augmentation. Popliteal Vein: No evidence of thrombus. Normal compressibility, respiratory phasicity and response to augmentation. Calf Veins: No evidence of thrombus. Normal compressibility and flow on color Doppler imaging. Superficial Great Saphenous Vein: No evidence of thrombus. Normal compressibility. Venous Reflux:  None. Other Findings:  None. LEFT LOWER EXTREMITY Common Femoral Vein: No evidence of thrombus. Normal compressibility, respiratory phasicity and response to augmentation. Saphenofemoral Junction: No evidence of thrombus. Normal compressibility and flow on color Doppler imaging. Profunda Femoral Vein: No evidence of thrombus. Normal compressibility and flow on color Doppler imaging. Femoral Vein: No evidence of thrombus. Normal compressibility, respiratory phasicity and response to augmentation. Popliteal Vein: No evidence of thrombus. Normal compressibility, respiratory phasicity and response to augmentation. Calf Veins: No evidence of thrombus. Normal compressibility and flow on color Doppler imaging. Superficial Great Saphenous Vein:  No evidence of thrombus. Normal compressibility. Venous Reflux:  None. Other Findings:  None. IMPRESSION: No evidence of deep venous thrombosis in either lower extremity.  Electronically Signed   By: Lavonia Dana M.D.   On: 12/02/2017 14:15   Dg Toe Great Left  Result Date: 12/02/2017 CLINICAL DATA:  Left great toe pain swelling. EXAM: LEFT GREAT TOE COMPARISON:  None. FINDINGS: No acute osseous abnormality. Chondrocalcinosis is seen in the first metatarsophalangeal joint. IMPRESSION: Chondrocalcinosis in the first metatarsophalangeal joint can be seen with calcium pyrophosphate deposition disease. Electronically Signed   By: Lorin Picket M.D.   On: 12/02/2017 16:45     Medications:   . furosemide (LASIX) infusion 8 mg/hr (12/03/17 1125)   . aspirin  325 mg Oral Daily  . atorvastatin  40 mg Oral Daily  . finasteride  5 mg Oral Daily  . heparin  5,000 Units Subcutaneous Q8H  . I-VITE  1 tablet Oral Daily  . loratadine  10 mg Oral Daily  . pantoprazole  40 mg Oral Daily   acetaminophen **OR** acetaminophen, albuterol, bisacodyl, HYDROcodone-acetaminophen, ondansetron **OR** ondansetron (ZOFRAN) IV, senna-docusate  Assessment/ Plan:  Andrew [name] is a 84 y.o. black male with hypertension, hyperlipidemia, gout, history of prostate cancer, coronary artery disease, who was admitted to [facility] on 12/01/2017 for Peripheral edema [R60.9] AKI (acute kidney injury) ([place]) [N17.9]  1. Acute renal failure on chronic kidney disease stage III with bland urinalysis. Baseline creatinine of 2.1, GFR of 37 on 10/15/17. Progressive renal failure in the last month.  - Discontinued IV fluids - Increase IV furosemide gtt to 8mg /hr  2. Hypertension: with echocardiogram diastolic . Significant peripheral edema. Home regimen of torsemide.  - IV furosemide gtt as above.   3. Gout: with hyperuricemia.  - Appreciate rheumatology input - Agree with radiographic imaging and restarting low dose allopurinol.   4. Anemia with chronic kidney disease: microcytic. Iron studies in 12/18 acceptable.       LOS: 2 Sajjad Honea 4/23/20192:11 PM

## 2017-12-03 NOTE — Progress Notes (Signed)
[facility] Podiatry                                                      Patient Demographics  Rodriques Badie, is a 84 y.o. male   MRN: [id number]   DOB - Dec 22, 1932  Admit Date - 12/01/2017    Outpatient Primary MD for the patient is [name], MD  Consult requested in the Hospital by Max Sane, MD, On 12/03/2017    With History of -  Past Medical History:  Diagnosis Date  . Arteriosclerosis of coronary artery 01/27/2014  . Benign essential HTN 12/11/2013  . Carpal tunnel syndrome   . Coronary atherosclerosis of native coronary artery   . Disorder of mitral valve 12/11/2013  . Encounter for long-term (current) use of antiplatelets/antithrombotics   . Gout   . Hyperlipidemia   . Hypertension   . Lung nodule    found on xray in Aug 2018  . Mitral valve disorder   . Osteoarthritis   . Prostate cancer [facility])       Past Surgical History:  Procedure Laterality Date  . COLONOSCOPY    . COLONOSCOPY WITH PROPOFOL N/A 07/23/2016   Procedure: COLONOSCOPY WITH PROPOFOL;  Surgeon: Manya Silvas, MD;  Location: [facility] ENDOSCOPY;  Service: Endoscopy;  Laterality: N/A;  . CORONARY ANGIOPLASTY WITH STENT PLACEMENT    . ESOPHAGOGASTRODUODENOSCOPY Left 07/28/2017   Procedure: ESOPHAGOGASTRODUODENOSCOPY (EGD);  Surgeon: Virgel Manifold, MD;  Location: [facility] ENDOSCOPY;  Service: Endoscopy;  Laterality: Left;  . ESOPHAGOGASTRODUODENOSCOPY (EGD) WITH PROPOFOL N/A 07/23/2016   Procedure: ESOPHAGOGASTRODUODENOSCOPY (EGD) WITH PROPOFOL;  Surgeon: Manya Silvas, MD;  Location: [facility] ENDOSCOPY;  Service: Endoscopy;  Laterality: N/A;  . PROSTATE BIOPSY      in for   Chief Complaint  Patient presents with  . Dizziness  . Leg Swelling  . Shortness of Breath     HPI  Winfield Caba  is a 84 y.o. male, patient admitted with kidney failure.  I was asked to see him  because of great toe pain.  Now he has excessive swelling with some blistering on the top of the left foot and some heavy drainage from the skin on his leg.    Review of Systems    In addition to the HPI above,  No Fever-chills, No Headache, No changes with Vision or hearing, No problems swallowing food or Liquids, No Chest pain, Cough or Shortness of Breath, No Abdominal pain, No Nausea or Vommitting, Bowel movements are regular, No Blood in stool or Urine, No dysuria, New problem with excessive swelling to his left leg and blister formation on top of the foot.  Noted lymph drainage from the skin along his leg and foot. No new joints pains-aches,  No new weakness, tingling, numbness in any extremity, No recent weight gain or loss, No polyuria, polydypsia or polyphagia, No significant Mental Stressors.  A full 10 point Review of Systems was done, except as stated above, all other Review of Systems were negative.   Social History Social History   Tobacco Use  . Smoking status: Never Smoker  . Smokeless tobacco: Never Used  Substance Use Topics  . Alcohol use: No    Family History Family History  Problem Relation Age of Onset  . Cancer Mother        stomach and  uterus  . Cancer Sister        breast    Prior to Admission medications   Medication Sig Start Date End Date Taking? Authorizing Provider  aspirin 325 MG tablet Take 325 mg by mouth daily.    Yes [provider]  atorvastatin (LIPITOR) 40 MG tablet Take 40 mg by mouth daily. 04/24/15  Yes [provider]  finasteride (PROSCAR) 5 MG tablet Take 5 mg by mouth daily. 04/13/15  Yes [provider]  loratadine (CLARITIN) 10 MG tablet Take 1 tablet (10 mg total) by mouth daily. 11/03/17  Yes Gouru, Illene Silver, MD  omeprazole (PRILOSEC) 20 MG capsule Take 1 capsule (20 mg total) by mouth 2 (two) times daily. 07/28/17 07/28/18 Yes Mody, Ulice Bold, MD  torsemide (DEMADEX) 20 MG tablet Take 2 tablets (40 mg  total) by mouth daily. 11/02/17  Yes Gouru, Illene Silver, MD    Anti-infectives (From admission, onward)   None      Scheduled Meds: . aspirin  325 mg Oral Daily  . atorvastatin  40 mg Oral Daily  . finasteride  5 mg Oral Daily  . heparin  5,000 Units Subcutaneous Q8H  . I-VITE  1 tablet Oral Daily  . loratadine  10 mg Oral Daily  . pantoprazole  40 mg Oral Daily   Continuous Infusions: . furosemide (LASIX) infusion 8 mg/hr (12/03/17 1125)   PRN Meds:.acetaminophen **OR** acetaminophen, albuterol, bisacodyl, ondansetron **OR** ondansetron (ZOFRAN) IV, senna-docusate  No Known Allergies  Physical Exam  Vitals  Blood pressure 99/67, pulse 97, temperature 97.7 F (36.5 C), temperature source Oral, resp. rate 18, height 5\' 9"  (1.753 m), weight 73.9 kg (163 lb), SpO2 100 %.  Lower Extremity exam: Significant lymphedema and venous stasis edema to the left leg in particular.  Right leg has some but the left leg started to show blister formations on the foot as a result of this excess edema.  Lymph fluid drainage from the skin pores as well along the leg.  Sock is notably moist with this.  Not appear to be infected but I am concerned about the blisters are starting to form. Data Review  CBC Recent Labs  Lab 12/01/17 1004 12/03/17 0559  WBC 7.6 7.8  HGB 9.6* 9.3*  HCT 31.0* 29.9*  PLT 332 307  MCV 71.8* 71.5*  MCH 22.3* 22.1*  MCHC 31.0* 30.9*  RDW 20.9* 21.1*   ------------------------------------------------------------------------------------------------------------------  Chemistries  Recent Labs  Lab 12/01/17 1004 12/02/17 0509 12/03/17 0559  NA 135 137 138  K 5.0 4.7 4.7  CL 104 106 108  CO2 20* 21* 22  GLUCOSE 106* 108* 99  BUN 104* 96* 95*  CREATININE 3.75* 3.62* 3.42*  CALCIUM 8.8* 8.3* 8.3*  AST  --   --  24  ALT  --   --  30  ALKPHOS  --   --  163*  BILITOT  --   --  2.0*    ----------------------------------------------------------------------------------  Imaging results:   Mr Foot Left Wo Contrast  Result Date: 12/03/2017 CLINICAL DATA:  Left foot and toe pain over the past few weeks. EXAM: MRI OF THE LEFT FOOT WITHOUT CONTRAST TECHNIQUE: Multiplanar, multisequence MR imaging of the left mid and forefoot was performed. No intravenous contrast was administered. COMPARISON:  Same day great toe radiographs. FINDINGS: Bones/Joint/Cartilage Osteoarthritis of the interphalangeal, first MTP and TMT joints of the great toe. Associated subchondral degenerative cystic change is noted involving the base and head of the first metatarsal and across the  interphalangeal joint of the great toe. Lesser degrees of degenerative joint space narrowing of the DIP and PIP joints of the second through fifth digits. Areas of minimal edema involving the tuft of the great toe and cortical destruction of the middle phalanx of the second toe with minimal edema raise concern for early changes of osteomyelitis versus erosive changes possibly from an inflammatory arthropathy. Ligaments Noncontributory Muscles and Tendons No intramuscular fluid collection or abscess. No significant muscle atrophy. No evidence of a tenosynovitis. The tarsal Soft tissues Soft tissue edema and swelling is noted along the dorsum of the mid and forefoot. No focal fluid collections are identified. IMPRESSION: 1. Osteoarthritis of the forefoot as above more notably involving the interphalangeal, first MTP and TMT joints with joint space narrowing and subchondral cystic change. 2. Lesser degrees of osteoarthritic joint space narrowing is seen of the DIP and PIP joints of the second through fifth digits and fourth TMT joints. 3. Faint marrow edema involving the tuft of the great toe cannot exclude posttraumatic or reactive edema versus early changes of an osteomyelitis. 4. Cortical irregularity of the second middle phalangeal head  with slight marrow edema also raise concern for changes of osteomyelitis although changes from inflammatory arthropathy may also have a similar appearance. Correlate with dedicated foot and/or toe radiographs. 5. Soft tissue swelling over the dorsum of the included foot. Electronically Signed   By: Ashley Royalty M.D.   On: 12/03/2017 00:31   US Venous Img Lower Bilateral  Result Date: 12/02/2017 CLINICAL DATA:  Swelling in both legs, pain, history prostate cancer EXAM: BILATERAL LOWER EXTREMITY VENOUS DOPPLER ULTRASOUND TECHNIQUE: Gray-scale sonography with graded compression, as well as color Doppler and duplex ultrasound were performed to evaluate the lower extremity deep venous systems from the level of the common femoral vein and including the common femoral, femoral, profunda femoral, popliteal and calf veins including the posterior tibial, peroneal and gastrocnemius veins when visible. The superficial great saphenous vein was also interrogated. Spectral Doppler was utilized to evaluate flow at rest and with distal augmentation maneuvers in the common femoral, femoral and popliteal veins. COMPARISON:  None. FINDINGS: RIGHT LOWER EXTREMITY Common Femoral Vein: No evidence of thrombus. Normal compressibility, respiratory phasicity and response to augmentation. Saphenofemoral Junction: No evidence of thrombus. Normal compressibility and flow on color Doppler imaging. Profunda Femoral Vein: No evidence of thrombus. Normal compressibility and flow on color Doppler imaging. Femoral Vein: No evidence of thrombus. Normal compressibility, respiratory phasicity and response to augmentation. Popliteal Vein: No evidence of thrombus. Normal compressibility, respiratory phasicity and response to augmentation. Calf Veins: No evidence of thrombus. Normal compressibility and flow on color Doppler imaging. Superficial Great Saphenous Vein: No evidence of thrombus. Normal compressibility. Venous Reflux:  None. Other Findings:   None. LEFT LOWER EXTREMITY Common Femoral Vein: No evidence of thrombus. Normal compressibility, respiratory phasicity and response to augmentation. Saphenofemoral Junction: No evidence of thrombus. Normal compressibility and flow on color Doppler imaging. Profunda Femoral Vein: No evidence of thrombus. Normal compressibility and flow on color Doppler imaging. Femoral Vein: No evidence of thrombus. Normal compressibility, respiratory phasicity and response to augmentation. Popliteal Vein: No evidence of thrombus. Normal compressibility, respiratory phasicity and response to augmentation. Calf Veins: No evidence of thrombus. Normal compressibility and flow on color Doppler imaging. Superficial Great Saphenous Vein: No evidence of thrombus. Normal compressibility. Venous Reflux:  None. Other Findings:  None. IMPRESSION: No evidence of deep venous thrombosis in either lower extremity. Electronically Signed   By: Lavonia Dana  M.D.   On: 12/02/2017 14:15   Dg Toe Great Left  Result Date: 12/02/2017 CLINICAL DATA:  Left great toe pain swelling. EXAM: LEFT GREAT TOE COMPARISON:  None. FINDINGS: No acute osseous abnormality. Chondrocalcinosis is seen in the first metatarsophalangeal joint. IMPRESSION: Chondrocalcinosis in the first metatarsophalangeal joint can be seen with calcium pyrophosphate deposition disease. Electronically Signed   By: Lorin Picket M.D.   On: 12/02/2017 16:45    Assessment & Plan: MRI and x-rays are consistent with degenerative changes across the IP joint of the hallux and also the metatarsal phalangeal joint.  Also has some degenerative changes to the other metatarsal phalangeal joints.  These are consistent with osteoarthritis and gout.  I see no evidence of open wounds or sinus tracking that would raise the concern about osteomyelitis in these regions.  Think findings are more consistent with degenerative changes. He does have a significant increase in swelling to the left leg and is  developing some subsequent blister formation as a result of this.  We need to get the fluid and swelling under better control.  Today I will apply a underlying gauze wrap and Kerlix wrap to his foot and leg and then apply Coban over the top of it in order to apply some compression to the region.  Also spoke to him at length as has the nurse about keeping the foot elevated to try to reduce swelling in the leg.  Is been sitting quite a bit with the leg down.  When he leaves the hospital is going to need some compression wrap on there as well and he will need to follow-up either with [facility] or with me to try to continue with swelling control to the region.  Active Problems:   Renal failure (ARF), acute on chronic (HCC)   Pressure injury of skin   Family Communication: Plan discussed with patient  Albertine Patricia M.D on 12/03/2017 at 5:27 PM  Thank you for the consult, we will follow the patient with you in the Hospital.

## 2017-12-03 NOTE — Progress Notes (Addendum)
[place] [facility] [place] NAME: Andrew [name]    MR#:  [id number]  DATE OF BIRTH:  15-Aug-1932  SUBJECTIVE:  CHIEF COMPLAINT:   Chief Complaint  Patient presents with  . Dizziness  . Leg Swelling  . Shortness of Breath  some better today, sitting side of bed  REVIEW OF SYSTEMS:  Review of Systems  Constitutional: Negative for chills, fever and weight loss.  HENT: Negative for nosebleeds and sore throat.   Eyes: Negative for blurred vision.  Respiratory: Negative for cough, shortness of breath and wheezing.   Cardiovascular: Positive for leg swelling. Negative for chest pain, orthopnea and PND.  Gastrointestinal: Negative for abdominal pain, constipation, diarrhea, heartburn, nausea and vomiting.  Genitourinary: Negative for dysuria and urgency.  Musculoskeletal: Negative for back pain.  Skin: Negative for rash.  Neurological: Negative for dizziness, speech change, focal weakness and headaches.  Endo/Heme/Allergies: Does not bruise/bleed easily.  Psychiatric/Behavioral: Negative for depression.   DRUG ALLERGIES:  No Known Allergies VITALS:  Blood pressure 99/67, pulse 97, temperature 97.7 F (36.5 C), temperature source Oral, resp. rate 18, height 5\' 9"  (1.753 m), weight 73.9 kg (163 lb), SpO2 100 %. PHYSICAL EXAMINATION:  Physical Exam  Constitutional: He is oriented to person, place, and time.  HENT:  Head: Normocephalic and atraumatic.  Eyes: Pupils are equal, round, and reactive to light. Conjunctivae and EOM are normal.  Neck: Normal range of motion. Neck supple. No tracheal deviation present. No thyromegaly present.  Cardiovascular: Normal rate, regular rhythm and normal heart sounds.  Pulmonary/Chest: Effort normal and breath sounds normal. No respiratory distress. He has no wheezes. He exhibits no tenderness.  Abdominal: Soft. Bowel sounds are normal. He exhibits no distension. There is no tenderness.  Musculoskeletal: Normal  range of motion. He exhibits edema.  Neurological: He is alert and oriented to person, place, and time. No cranial nerve deficit.  Skin: Skin is warm and dry. No rash noted.   LABORATORY PANEL:  Male CBC Recent Labs  Lab 12/03/17 0559  WBC 7.8  HGB 9.3*  HCT 29.9*  PLT 307   ------------------------------------------------------------------------------------------------------------------ Chemistries  Recent Labs  Lab 12/03/17 0559  NA 138  K 4.7  CL 108  CO2 22  GLUCOSE 99  BUN 95*  CREATININE 3.42*  CALCIUM 8.3*  AST 24  ALT 30  ALKPHOS 163*  BILITOT 2.0*   RADIOLOGY:  Mr Foot Left Wo Contrast  Result Date: 12/03/2017 CLINICAL DATA:  Left foot and toe pain over the past few weeks. EXAM: MRI OF THE LEFT FOOT WITHOUT CONTRAST TECHNIQUE: Multiplanar, multisequence MR imaging of the left mid and forefoot was performed. No intravenous contrast was administered. COMPARISON:  Same day great toe radiographs. FINDINGS: Bones/Joint/Cartilage Osteoarthritis of the interphalangeal, first MTP and TMT joints of the great toe. Associated subchondral degenerative cystic change is noted involving the base and head of the first metatarsal and across the interphalangeal joint of the great toe. Lesser degrees of degenerative joint space narrowing of the DIP and PIP joints of the second through fifth digits. Areas of minimal edema involving the tuft of the great toe and cortical destruction of the middle phalanx of the second toe with minimal edema raise concern for early changes of osteomyelitis versus erosive changes possibly from an inflammatory arthropathy. Ligaments Noncontributory Muscles and Tendons No intramuscular fluid collection or abscess. No significant muscle atrophy. No evidence of a tenosynovitis. The tarsal Soft tissues Soft tissue edema and swelling is noted  along the dorsum of the mid and forefoot. No focal fluid collections are identified. IMPRESSION: 1. Osteoarthritis of the  forefoot as above more notably involving the interphalangeal, first MTP and TMT joints with joint space narrowing and subchondral cystic change. 2. Lesser degrees of osteoarthritic joint space narrowing is seen of the DIP and PIP joints of the second through fifth digits and fourth TMT joints. 3. Faint marrow edema involving the tuft of the great toe cannot exclude posttraumatic or reactive edema versus early changes of an osteomyelitis. 4. Cortical irregularity of the second middle phalangeal head with slight marrow edema also raise concern for changes of osteomyelitis although changes from inflammatory arthropathy may also have a similar appearance. Correlate with dedicated foot and/or toe radiographs. 5. Soft tissue swelling over the dorsum of the included foot. Electronically Signed   By: Ashley Royalty M.D.   On: 12/03/2017 00:31   Dg Toe Great Left  Result Date: 12/02/2017 CLINICAL DATA:  Left great toe pain swelling. EXAM: LEFT GREAT TOE COMPARISON:  None. FINDINGS: No acute osseous abnormality. Chondrocalcinosis is seen in the first metatarsophalangeal joint. IMPRESSION: Chondrocalcinosis in the first metatarsophalangeal joint can be seen with calcium pyrophosphate deposition disease. Electronically Signed   By: Lorin Picket M.D.   On: 12/02/2017 16:45   ASSESSMENT AND PLAN:  84 y.o.malewith a known history of multiple medical problems including CAD, hypertension, hyperlipidemia, CKD, prostate cancer and a lung nodule.  The patient came to ED due to dizziness and leg swelling for 1 week  * Acute renal failure on CKD stage 3. Creat 3.6-> 3.4 - lasix drip per Nephro  * Hypertension.  Hold torsemide due to low blood pressure.  * CAD.  Continue aspirin and atorvastatin.  * Hyperlipidemia.  Continue atorvastatin.  * Anemia of chronic kidney disease.  Stable.  * LE swelling: likely gouty arthritis, appreciate Rheum, Podiatry and Vascular input.  * acute on chronic systolic & diastolic  CHF: On lasix drip per Nephro Monitor I & Os Daily wt - c/s cardio   At DC - home with HH, PT, RN - PC     All the records are reviewed and case discussed with Care Management/Social Worker. Management plans discussed with the patient, nursing and they are in agreement.  CODE STATUS: DNR  TOTAL TIME TAKING CARE OF THIS PATIENT: 35 minutes.   More than 50% of the time was spent in counseling/coordination of care: YES  POSSIBLE D/C IN 1-2 DAYS, DEPENDING ON CLINICAL CONDITION.   Max Sane M.D on 12/03/2017 at 3:26 PM  Between 7am to 6pm - Pager - [phone]  After 6pm go to [email or web address] - Proofreader  [facility]  Office  [phone]  CC: Primary care physician; [name], MD  Note: This dictation was prepared with Dragon dictation along with smaller phrase technology. Any transcriptional errors that result from this process are unintentional.

## 2017-12-04 LAB — RENAL FUNCTION PANEL
ALBUMIN: 2.7 g/dL — AB (ref 3.5–5.0)
ANION GAP: 8 (ref 5–15)
BUN: 92 mg/dL — ABNORMAL HIGH (ref 6–20)
CO2: 22 mmol/L (ref 22–32)
Calcium: 8.1 mg/dL — ABNORMAL LOW (ref 8.9–10.3)
Chloride: 108 mmol/L (ref 101–111)
Creatinine, Ser: 3.21 mg/dL — ABNORMAL HIGH (ref 0.61–1.24)
GFR calc non Af Amer: 17 mL/min — ABNORMAL LOW (ref >60)
GFR, EST AFRICAN AMERICAN: 19 mL/min — AB (ref >60)
GLUCOSE: 139 mg/dL — AB (ref 65–99)
Phosphorus: 4 mg/dL (ref 2.5–4.6)
Potassium: 4.2 mmol/L (ref 3.5–5.1)
Sodium: 138 mmol/L (ref 135–145)

## 2017-12-04 LAB — CBC
HCT: 29.4 % — ABNORMAL LOW (ref 40.0–52.0)
HEMOGLOBIN: 9.2 g/dL — AB (ref 13.0–18.0)
MCH: 22.5 pg — ABNORMAL LOW (ref 26.0–34.0)
MCHC: 31.5 g/dL — AB (ref 32.0–36.0)
MCV: 71.7 fL — ABNORMAL LOW (ref 80.0–100.0)
Platelets: 291 10*3/uL (ref 150–440)
RBC: 4.1 MIL/uL — ABNORMAL LOW (ref 4.40–5.90)
RDW: 21.2 % — ABNORMAL HIGH (ref 11.5–14.5)
WBC: 6.9 10*3/uL (ref 3.8–10.6)

## 2017-12-04 NOTE — Progress Notes (Signed)
[facility]  ROUNDING NOTE   Subjective:   Furosemide gtt 8mg  /hr   UOP 2157mL.   Objective:  Vital signs in last 24 hours:  Temp:  [97.7 F (36.5 C)] 97.7 F (36.5 C) (04/24 0229) Pulse Rate:  [92-94] 92 (04/24 0229) Resp:  [17-18] 18 (04/24 0229) BP: (107-110)/(73-77) 110/77 (04/24 0229) SpO2:  [100 %] 100 % (04/24 0229) Weight:  [74.7 kg (164 lb 10.9 oz)-74.9 kg (165 lb 2 oz)] 74.7 kg (164 lb 10.9 oz) (04/24 0500)  Weight change:  Filed Weights   12/01/17 1002 12/03/17 2242 12/04/17 0500  Weight: 73.9 kg (163 lb) 74.9 kg (165 lb 2 oz) 74.7 kg (164 lb 10.9 oz)    Intake/Output: I/O last 3 completed shifts: In: 910 [P.O.:720; I.V.:190] Out: 2750 [Urine:2750]   Intake/Output this shift:  Total I/O In: 240 [P.O.:240] Out: -   Physical Exam: General: NAD,   Head: Normocephalic, atraumatic. Moist oral mucosal membranes  Eyes: Anicteric, PERRL  Neck: Supple, trachea midline  Lungs:  Clear to auscultation  Heart: Regular rate and rhythm  Abdomen:  Soft, nontender,   Extremities:  + peripheral edema.  Neurologic: Nonfocal, moving all four extremities  Skin: No lesions        Basic Metabolic Panel: Recent Labs  Lab 12/01/17 1004 12/02/17 0509 12/03/17 0559 12/04/17 0443  NA 135 137 138 138  K 5.0 4.7 4.7 4.2  CL 104 106 108 108  CO2 20* 21* 22 22  GLUCOSE 106* 108* 99 139*  BUN 104* 96* 95* 92*  CREATININE 3.75* 3.62* 3.42* 3.21*  CALCIUM 8.8* 8.3* 8.3* 8.1*  PHOS  --   --   --  4.0    Liver Function Tests: Recent Labs  Lab 12/03/17 0559 12/04/17 0443  AST 24  --   ALT 30  --   ALKPHOS 163*  --   BILITOT 2.0*  --   PROT 5.5*  --   ALBUMIN 2.8* 2.7*   No results for input(s): LIPASE, AMYLASE in the last 168 hours. No results for input(s): AMMONIA in the last 168 hours.  CBC: Recent Labs  Lab 12/01/17 1004 12/03/17 0559 12/04/17 0443  WBC 7.6 7.8 6.9  HGB 9.6* 9.3* 9.2*  HCT 31.0* 29.9* 29.4*  MCV 71.8* 71.5* 71.7*  PLT  332 307 291    Cardiac Enzymes: Recent Labs  Lab 12/01/17 1004  TROPONINI 0.05*    BNP: Invalid input(s): POCBNP  CBG: No results for input(s): GLUCAP in the last 168 hours.  Microbiology: No results found for this or any previous visit.  Coagulation Studies: No results for input(s): LABPROT, INR in the last 72 hours.  Urinalysis: Recent Labs    12/01/17 2009  COLORURINE YELLOW*  LABSPEC 1.012  PHURINE 5.0  GLUCOSEU NEGATIVE  HGBUR NEGATIVE  BILIRUBINUR NEGATIVE  KETONESUR NEGATIVE  PROTEINUR NEGATIVE  NITRITE NEGATIVE  LEUKOCYTESUR NEGATIVE      Imaging: Mr Foot Left Wo Contrast  Result Date: 12/03/2017 CLINICAL DATA:  Left foot and toe pain over the past few weeks. EXAM: MRI OF THE LEFT FOOT WITHOUT CONTRAST TECHNIQUE: Multiplanar, multisequence MR imaging of the left mid and forefoot was performed. No intravenous contrast was administered. COMPARISON:  Same day great toe radiographs. FINDINGS: Bones/Joint/Cartilage Osteoarthritis of the interphalangeal, first MTP and TMT joints of the great toe. Associated subchondral degenerative cystic change is noted involving the base and head of the first metatarsal and across the interphalangeal joint of the great toe. Lesser degrees  of degenerative joint space narrowing of the DIP and PIP joints of the second through fifth digits. Areas of minimal edema involving the tuft of the great toe and cortical destruction of the middle phalanx of the second toe with minimal edema raise concern for early changes of osteomyelitis versus erosive changes possibly from an inflammatory arthropathy. Ligaments Noncontributory Muscles and Tendons No intramuscular fluid collection or abscess. No significant muscle atrophy. No evidence of a tenosynovitis. The tarsal Soft tissues Soft tissue edema and swelling is noted along the dorsum of the mid and forefoot. No focal fluid collections are identified. IMPRESSION: 1. Osteoarthritis of the forefoot as  above more notably involving the interphalangeal, first MTP and TMT joints with joint space narrowing and subchondral cystic change. 2. Lesser degrees of osteoarthritic joint space narrowing is seen of the DIP and PIP joints of the second through fifth digits and fourth TMT joints. 3. Faint marrow edema involving the tuft of the great toe cannot exclude posttraumatic or reactive edema versus early changes of an osteomyelitis. 4. Cortical irregularity of the second middle phalangeal head with slight marrow edema also raise concern for changes of osteomyelitis although changes from inflammatory arthropathy may also have a similar appearance. Correlate with dedicated foot and/or toe radiographs. 5. Soft tissue swelling over the dorsum of the included foot. Electronically Signed   By: Ashley Royalty M.D.   On: 12/03/2017 00:31   Dg Toe Great Left  Result Date: 12/02/2017 CLINICAL DATA:  Left great toe pain swelling. EXAM: LEFT GREAT TOE COMPARISON:  None. FINDINGS: No acute osseous abnormality. Chondrocalcinosis is seen in the first metatarsophalangeal joint. IMPRESSION: Chondrocalcinosis in the first metatarsophalangeal joint can be seen with calcium pyrophosphate deposition disease. Electronically Signed   By: Lorin Picket M.D.   On: 12/02/2017 16:45     Medications:   . furosemide (LASIX) infusion 8 mg/hr (12/03/17 1125)   . aspirin  325 mg Oral Daily  . atorvastatin  40 mg Oral Daily  . finasteride  5 mg Oral Daily  . heparin  5,000 Units Subcutaneous Q8H  . I-VITE  1 tablet Oral Daily  . loratadine  10 mg Oral Daily  . pantoprazole  40 mg Oral Daily   acetaminophen **OR** acetaminophen, albuterol, bisacodyl, ondansetron **OR** ondansetron (ZOFRAN) IV, senna-docusate  Assessment/ Plan:  Mr. KANO HECKMANN is a 84 y.o. black male with hypertension, hyperlipidemia, gout, history of prostate cancer, coronary artery disease, who was admitted to [facility] on 12/01/2017 for Peripheral edema [R60.9] AKI  (acute kidney injury) ([place]) [N17.9]  1. Acute renal failure on chronic kidney disease stage III with bland urinalysis. Baseline creatinine of 2.1, GFR of 37 on 10/15/17. Progressive renal failure in the last month.  - Continue IV furosemide gtt to 8mg /hr  2. Hypertension: with diastolic dysfunction. Significant peripheral edema. Home regimen of torsemide.  - IV furosemide gtt   3. Gout: with hyperuricemia.  - Appreciate rheumatology input - Continue allopurinol.   4. Anemia with chronic kidney disease: microcytic. Iron studies in 12/18 acceptable. hemoglobin 9.2    LOS: 3 Andrew [name] 4/24/20192:19 PM

## 2017-12-04 NOTE — Care Management Important Message (Signed)
Copy of signed IM left in patient's room.    

## 2017-12-04 NOTE — Progress Notes (Signed)
[place] [facility] [place] NAME: Andrew [name]    MR#:  [id number]  DATE OF BIRTH:  [birth year]  SUBJECTIVE:  CHIEF COMPLAINT:   Chief Complaint  Patient presents with  . Dizziness  . Leg Swelling  . Shortness of Breath  improving slowly, creat improving REVIEW OF SYSTEMS:  Review of Systems  Constitutional: Negative for chills, fever and weight loss.  HENT: Negative for nosebleeds and sore throat.   Eyes: Negative for blurred vision.  Respiratory: Negative for cough, shortness of breath and wheezing.   Cardiovascular: Positive for leg swelling. Negative for chest pain, orthopnea and PND.  Gastrointestinal: Negative for abdominal pain, constipation, diarrhea, heartburn, nausea and vomiting.  Genitourinary: Negative for dysuria and urgency.  Musculoskeletal: Negative for back pain.  Skin: Negative for rash.  Neurological: Negative for dizziness, speech change, focal weakness and headaches.  Endo/Heme/Allergies: Does not bruise/bleed easily.  Psychiatric/Behavioral: Negative for depression.   DRUG ALLERGIES:  No Known Allergies VITALS:  Blood pressure 110/77, pulse 92, temperature 97.7 F (36.5 C), temperature source Oral, resp. rate 18, height 5\' 9"  (1.753 m), weight 74.7 kg (164 lb 10.9 oz), SpO2 100 %. PHYSICAL EXAMINATION:  Physical Exam  Constitutional: He is oriented to person, place, and time.  HENT:  Head: Normocephalic and atraumatic.  Eyes: Pupils are equal, round, and reactive to light. Conjunctivae and EOM are normal.  Neck: Normal range of motion. Neck supple. No tracheal deviation present. No thyromegaly present.  Cardiovascular: Normal rate, regular rhythm and normal heart sounds.  Pulmonary/Chest: Effort normal and breath sounds normal. No respiratory distress. He has no wheezes. He exhibits no tenderness.  Abdominal: Soft. Bowel sounds are normal. He exhibits no distension. There is no tenderness.  Musculoskeletal: Normal  range of motion. He exhibits edema.  Neurological: He is alert and oriented to person, place, and time. No cranial nerve deficit.  Skin: Skin is warm and dry. No rash noted.   LABORATORY PANEL:  Male CBC Recent Labs  Lab 12/04/17 0443  WBC 6.9  HGB 9.2*  HCT 29.4*  PLT 291   ------------------------------------------------------------------------------------------------------------------ Chemistries  Recent Labs  Lab 12/03/17 0559 12/04/17 0443  NA 138 138  K 4.7 4.2  CL 108 108  CO2 22 22  GLUCOSE 99 139*  BUN 95* 92*  CREATININE 3.42* 3.21*  CALCIUM 8.3* 8.1*  AST 24  --   ALT 30  --   ALKPHOS 163*  --   BILITOT 2.0*  --    RADIOLOGY:  No results found. ASSESSMENT AND PLAN:  84 y.o.malewith a known history of multiple medical problems including CAD, hypertension, hyperlipidemia, CKD, prostate cancer and a lung nodule.  The patient came to ED due to dizziness and leg swelling for 1 week  * Acute renal failure on CKD stage 3. Creat 3.6-> 3.4->3.2 - lasix drip per Nephro  * Hypertension.  Hold torsemide due to low blood pressure.  * CAD.  Continue aspirin and atorvastatin.  * Hyperlipidemia.  Continue atorvastatin.  * Anemia of chronic kidney disease.  Stable.  * LE swelling: likely gouty arthritis, appreciate Rheum, Podiatry and Vascular input.  * acute on chronic systolic & diastolic CHF: On lasix drip per Nephro Monitor I & Os Daily wt - c/s cardio   At DC - home with HH, PT, RN - PC     All the records are reviewed and case discussed with Care Management/Social Worker. Management plans discussed with the patient, nursing and  they are in agreement.  CODE STATUS: DNR  TOTAL TIME TAKING CARE OF THIS PATIENT: 35 minutes.   More than 50% of the time was spent in counseling/coordination of care: YES  POSSIBLE D/C IN 1-2 DAYS, DEPENDING ON CLINICAL CONDITION.   Max Sane M.D on 12/04/2017 at 5:11 PM  Between 7am to 6pm - Pager -  [phone]  After 6pm go to [email or web address] - Proofreader  [facility]  Office  [phone]  CC: Primary care physician; [name], MD  Note: This dictation was prepared with Dragon dictation along with smaller phrase technology. Any transcriptional errors that result from this process are unintentional.

## 2017-12-05 ENCOUNTER — Other Ambulatory Visit: Payer: Self-pay

## 2017-12-05 LAB — CBC
HEMATOCRIT: 29.1 % — AB (ref 40.0–52.0)
Hemoglobin: 9.2 g/dL — ABNORMAL LOW (ref 13.0–18.0)
MCH: 22.6 pg — AB (ref 26.0–34.0)
MCHC: 31.7 g/dL — AB (ref 32.0–36.0)
MCV: 71.2 fL — AB (ref 80.0–100.0)
Platelets: 283 10*3/uL (ref 150–440)
RBC: 4.09 MIL/uL — ABNORMAL LOW (ref 4.40–5.90)
RDW: 21.1 % — ABNORMAL HIGH (ref 11.5–14.5)
WBC: 6.2 10*3/uL (ref 3.8–10.6)

## 2017-12-05 LAB — PROTIME-INR
INR: 1.19
Prothrombin Time: 15 seconds (ref 11.4–15.2)

## 2017-12-05 LAB — BASIC METABOLIC PANEL
Anion gap: 9 (ref 5–15)
BUN: 91 mg/dL — ABNORMAL HIGH (ref 6–20)
CALCIUM: 7.9 mg/dL — AB (ref 8.9–10.3)
CO2: 23 mmol/L (ref 22–32)
CREATININE: 2.82 mg/dL — AB (ref 0.61–1.24)
Chloride: 107 mmol/L (ref 101–111)
GFR calc Af Amer: 22 mL/min — ABNORMAL LOW (ref >60)
GFR calc non Af Amer: 19 mL/min — ABNORMAL LOW (ref >60)
GLUCOSE: 103 mg/dL — AB (ref 65–99)
Potassium: 3.1 mmol/L — ABNORMAL LOW (ref 3.5–5.1)
Sodium: 139 mmol/L (ref 135–145)

## 2017-12-05 LAB — APTT
aPTT: 38 seconds — ABNORMAL HIGH (ref 24–36)
aPTT: >160 seconds (ref 24–36)

## 2017-12-05 LAB — TROPONIN I: Troponin I: 0.06 ng/mL (ref <0.03)

## 2017-12-05 LAB — MAGNESIUM
Magnesium: 2.2 mg/dL (ref 1.7–2.4)
Magnesium: 2.9 mg/dL — ABNORMAL HIGH (ref 1.7–2.4)

## 2017-12-05 LAB — HEPARIN LEVEL (UNFRACTIONATED): Heparin Unfractionated: 2.02 IU/mL — ABNORMAL HIGH (ref 0.30–0.70)

## 2017-12-05 LAB — POTASSIUM: Potassium: 4.6 mmol/L (ref 3.5–5.1)

## 2017-12-05 LAB — MRSA PCR SCREENING: MRSA BY PCR: NEGATIVE

## 2017-12-05 LAB — TSH: TSH: 4.967 u[IU]/mL — ABNORMAL HIGH (ref 0.350–4.500)

## 2017-12-05 LAB — GLUCOSE, CAPILLARY: GLUCOSE-CAPILLARY: 97 mg/dL (ref 65–99)

## 2017-12-05 MED ORDER — POTASSIUM CHLORIDE 10 MEQ/100ML IV SOLN
10.0000 meq | INTRAVENOUS | Status: AC
  Administered 2017-12-05 (×2): 10 meq via INTRAVENOUS
  Filled 2017-12-05 (×2): qty 100

## 2017-12-05 MED ORDER — AMIODARONE HCL IN DEXTROSE 360-4.14 MG/200ML-% IV SOLN
60.0000 mg/h | INTRAVENOUS | Status: AC
  Administered 2017-12-05: 60 mg/h via INTRAVENOUS
  Filled 2017-12-05: qty 200

## 2017-12-05 MED ORDER — POTASSIUM CHLORIDE CRYS ER 20 MEQ PO TBCR: 40.0000 meq | EXTENDED_RELEASE_TABLET | ORAL | Status: DC

## 2017-12-05 MED ORDER — AMIODARONE HCL IN DEXTROSE 360-4.14 MG/200ML-% IV SOLN
INTRAVENOUS | Status: AC
  Administered 2017-12-05: 150 mg via INTRAVENOUS
  Filled 2017-12-05: qty 200

## 2017-12-05 MED ORDER — MORPHINE SULFATE (PF) 4 MG/ML IV SOLN
INTRAVENOUS | Status: AC
  Filled 2017-12-05: qty 1

## 2017-12-05 MED ORDER — AMIODARONE LOAD VIA INFUSION
150.0000 mg | Freq: Once | INTRAVENOUS | Status: AC
  Administered 2017-12-05: 150 mg via INTRAVENOUS
  Filled 2017-12-05: qty 83.34

## 2017-12-05 MED ORDER — HEPARIN (PORCINE) IN NACL 100-0.45 UNIT/ML-% IJ SOLN
1050.0000 [IU]/h | INTRAMUSCULAR | Status: DC
  Administered 2017-12-05: 1050 [IU]/h via INTRAVENOUS
  Filled 2017-12-05: qty 250

## 2017-12-05 MED ORDER — AMIODARONE HCL IN DEXTROSE 360-4.14 MG/200ML-% IV SOLN
30.0000 mg/h | INTRAVENOUS | Status: DC
  Administered 2017-12-05 – 2017-12-06 (×3): 30 mg/h via INTRAVENOUS
  Filled 2017-12-05 (×2): qty 200

## 2017-12-05 MED ORDER — MORPHINE SULFATE (PF) 4 MG/ML IV SOLN: 4.0000 mg | INTRAVENOUS | Status: DC | PRN

## 2017-12-05 MED ORDER — POTASSIUM CHLORIDE CRYS ER 20 MEQ PO TBCR
40.0000 meq | EXTENDED_RELEASE_TABLET | ORAL | Status: DC
  Administered 2017-12-05: 40 meq via ORAL
  Filled 2017-12-05: qty 2

## 2017-12-05 MED ORDER — MORPHINE SULFATE (PF) 2 MG/ML IV SOLN: 2.0000 mg | INTRAVENOUS | Status: DC | PRN

## 2017-12-05 MED ORDER — METOPROLOL TARTRATE 5 MG/5ML IV SOLN: 2.5000 mg | INTRAVENOUS | Status: DC | PRN

## 2017-12-05 MED ORDER — HEPARIN (PORCINE) IN NACL 100-0.45 UNIT/ML-% IJ SOLN
900.0000 [IU]/h | INTRAMUSCULAR | Status: DC
  Administered 2017-12-06: 900 [IU]/h via INTRAVENOUS
  Filled 2017-12-05: qty 250

## 2017-12-05 MED ORDER — MAGNESIUM SULFATE 2 GM/50ML IV SOLN
2.0000 g | Freq: Once | INTRAVENOUS | Status: AC
  Administered 2017-12-05: 2 g via INTRAVENOUS
  Filled 2017-12-05: qty 50

## 2017-12-05 MED ORDER — ESMOLOL HCL-SODIUM CHLORIDE 2000 MG/100ML IV SOLN
25.0000 ug/kg/min | INTRAVENOUS | Status: DC
  Filled 2017-12-05: qty 100

## 2017-12-05 MED ORDER — MORPHINE SULFATE (PF) 4 MG/ML IV SOLN
4.0000 mg | Freq: Once | INTRAVENOUS | Status: AC
  Administered 2017-12-05: 4 mg via INTRAVENOUS

## 2017-12-05 MED ORDER — HEPARIN BOLUS VIA INFUSION
4000.0000 [IU] | Freq: Once | INTRAVENOUS | Status: DC
  Administered 2017-12-05: 4000 [IU] via INTRAVENOUS

## 2017-12-05 NOTE — Progress Notes (Signed)
ANTICOAGULATION CONSULT NOTE - Initial Consult  Pharmacy Consult for heparin Indication: atrial fibrillation  No Known Allergies  Patient Measurements: Height: 5\' 9"  (175.3 cm) Weight: 164 lb 10.9 oz (74.7 kg) IBW/kg (Calculated) : 70.7 Heparin Dosing Weight: 74.7 kg  Vital Signs: Temp: 96.9 F (36.1 C) (04/25 1600) Temp Source: Oral (04/25 1600) BP: 104/60 (04/25 1900)  Labs: Recent Labs    12/03/17 0559 12/04/17 0443 12/05/17 0459 12/05/17 0720 12/05/17 1757  HGB 9.3* 9.2* 9.2*  --   --   HCT 29.9* 29.4* 29.1*  --   --   PLT 307 291 283  --   --   APTT  --   --   --  38*  --   LABPROT  --   --   --  15.0  --   INR  --   --   --  1.19  --   HEPARINUNFRC  --   --   --   --  2.02*  CREATININE 3.42* 3.21* 2.82*  --   --   TROPONINI  --   --  0.06*  --   --     Estimated Creatinine Clearance: 20.2 mL/min (A) (by C-G formula based on SCr of 2.82 mg/dL (H)).   Medical History: Past Medical History:  Diagnosis Date  . Arteriosclerosis of coronary artery 01/27/2014  . Benign essential HTN 12/11/2013  . Carpal tunnel syndrome   . Coronary atherosclerosis of native coronary artery   . Disorder of mitral valve 12/11/2013  . Encounter for long-term (current) use of antiplatelets/antithrombotics   . Gout   . Hyperlipidemia   . Hypertension   . Lung nodule    found on xray in Aug 2018  . Mitral valve disorder   . Osteoarthritis   . Prostate cancer ([place])     Medications:  Scheduled:  . aspirin  325 mg Oral Daily  . atorvastatin  40 mg Oral Daily  . finasteride  5 mg Oral Daily  . I-VITE  1 tablet Oral Daily  . pantoprazole  40 mg Oral Daily    Assessment: Patient admitted for dizziness, leg swelling, and SOB went into afib. Pharmacy has been consulted for heparin dosing  Goal of Therapy:  Heparin level 0.3-0.7 units/ml Monitor platelets by anticoagulation protocol: Yes   Plan:  Patient was on subq heparin 5000 units q8h for VTE prophylaxis and received 3  doses yesterday. Will start drip without bolus @ 1050 units/hr and will check a HL @ 1600. Baseline orders drawn Will continue to monitor daily CBCs and will adjust per HL's  4/25:  HL @ 17:00 = 2.02.   May have been drawn incorrectly,  Lab will redraw .    [place] D Clinical Pharmacist 12/05/2017

## 2017-12-05 NOTE — Consult Note (Signed)
He was transferred here this early AM due to [facility] and multiple runs of VT. I placed an order for amiodarone protocol and stopped the order for esmolol. He has subsequently converted to NSR and ventricular ectopy has essentially resolved. Cardiology [facility]) has seen recently and [facility] cardiology has been notified. If he remains in sinus rhythm, he can be transferred to Telemetry floor either later today or in AM 04/26  Merton Border, MD PCCM service Mobile [phone] Pager [phone] 12/05/2017 2:56 PM

## 2017-12-05 NOTE — Progress Notes (Signed)
[facility]  ROUNDING NOTE   Subjective:   Furosemide gtt 8mg  /hr   Transferred to ICU last night for chest pain, atrial fibrillation with RVR.   UOP 2130  Creatinine 2.82 (3.21)  Objective:  Vital signs in last 24 hours:  Temp:  [97.9 F (36.6 C)] 97.9 F (36.6 C) (04/24 2020) Pulse Rate:  [95-159] 159 (04/25 0628) Resp:  [20] 20 (04/24 2020) BP: (85-106)/(66-73) 85/73 (04/25 0628) SpO2:  [100 %] 100 % (04/24 2020)  Weight change:  Filed Weights   12/01/17 1002 12/03/17 2242 12/04/17 0500  Weight: 73.9 kg (163 lb) 74.9 kg (165 lb 2 oz) 74.7 kg (164 lb 10.9 oz)    Intake/Output: I/O last 3 completed shifts: In: 39 [P.O.:720; I.V.:226] Out: 3810 [Urine:3430]   Intake/Output this shift:  No intake/output data recorded.  Physical Exam: General: NAD,   Head: Normocephalic, atraumatic. Moist oral mucosal membranes  Eyes: Anicteric, PERRL  Neck: Supple, trachea midline  Lungs:  Clear to auscultation  Heart: Regular rate and rhythm  Abdomen:  Soft, nontender,   Extremities:  + peripheral edema.  Neurologic: Nonfocal, moving all four extremities  Skin: No lesions        Basic Metabolic Panel: Recent Labs  Lab 12/01/17 1004 12/02/17 0509 12/03/17 0559 12/04/17 0443 12/05/17 0459  NA 135 137 138 138 139  K 5.0 4.7 4.7 4.2 3.1*  CL 104 106 108 108 107  CO2 20* 21* 22 22 23   GLUCOSE 106* 108* 99 139* 103*  BUN 104* 96* 95* 92* 91*  CREATININE 3.75* 3.62* 3.42* 3.21* 2.82*  CALCIUM 8.8* 8.3* 8.3* 8.1* 7.9*  PHOS  --   --   --  4.0  --     Liver Function Tests: Recent Labs  Lab 12/03/17 0559 12/04/17 0443  AST 24  --   ALT 30  --   ALKPHOS 163*  --   BILITOT 2.0*  --   PROT 5.5*  --   ALBUMIN 2.8* 2.7*   No results for input(s): LIPASE, AMYLASE in the last 168 hours. No results for input(s): AMMONIA in the last 168 hours.  CBC: Recent Labs  Lab 12/01/17 1004 12/03/17 0559 12/04/17 0443 12/05/17 0459  WBC 7.6 7.8 6.9 6.2   HGB 9.6* 9.3* 9.2* 9.2*  HCT 31.0* 29.9* 29.4* 29.1*  MCV 71.8* 71.5* 71.7* 71.2*  PLT 332 307 291 283    Cardiac Enzymes: Recent Labs  Lab 12/01/17 1004 12/05/17 0459  TROPONINI 0.05* 0.06*    BNP: Invalid input(s): POCBNP  CBG: No results for input(s): GLUCAP in the last 168 hours.  Microbiology: No results found for this or any previous visit.  Coagulation Studies: Recent Labs    12/05/17 0720  LABPROT 15.0  INR 1.19    Urinalysis: No results for input(s): COLORURINE, LABSPEC, PHURINE, GLUCOSEU, HGBUR, BILIRUBINUR, KETONESUR, PROTEINUR, UROBILINOGEN, NITRITE, LEUKOCYTESUR in the last 72 hours.  Invalid input(s): APPERANCEUR    Imaging: No results found.   Medications:   . amiodarone     Followed by  . amiodarone    . furosemide (LASIX) infusion 8 mg/hr (12/04/17 1705)  . heparin     . amiodarone  150 mg Intravenous Once  . aspirin  325 mg Oral Daily  . atorvastatin  40 mg Oral Daily  . finasteride  5 mg Oral Daily  . heparin  5,000 Units Subcutaneous Q8H  . I-VITE  1 tablet Oral Daily  . morphine      .  pantoprazole  40 mg Oral Daily  . potassium chloride  40 mEq Oral Q4H   acetaminophen **OR** acetaminophen, albuterol, bisacodyl, metoprolol tartrate, morphine injection **OR** morphine injection, ondansetron **OR** ondansetron (ZOFRAN) IV, senna-docusate  Assessment/ Plan:  [name] is a 84 y.o. black male with hypertension, hyperlipidemia, gout, history of prostate cancer, coronary artery disease, who was admitted to [facility] on 12/01/2017    1. Acute renal failure on chronic kidney disease stage III with bland urinalysis. Baseline creatinine of 2.1, GFR of 37 on 10/15/17. Progressive renal failure in the last month.  - Continue IV furosemide gtt to 8mg /hr  2. Hypertension: with diastolic dysfunction. Atrial fibrillation with RVR. Started on amiodarone gtt and heparin gtt.  Significant peripheral edema. Home regimen of torsemide.  - IV  furosemide gtt   3. Gout: with hyperuricemia.  - Appreciate rheumatology input - Continue allopurinol.   4. Anemia with chronic kidney disease: microcytic. Iron studies in 12/18 acceptable. hemoglobin 9.2  5. Hypokalemia: secondary to loop diuretic - Replace potassium today.    LOS: 4 Andrew [name] 4/25/20198:47 AM

## 2017-12-05 NOTE — Progress Notes (Signed)
Spoke with Shanon Brow in Pharmacy, gave order to stop pt's heparin drip at this time and then restart at lower rate at 0100.  Read back and verified.

## 2017-12-05 NOTE — Progress Notes (Signed)
ANTICOAGULATION CONSULT NOTE - Initial Consult  Pharmacy Consult for heparin Indication: atrial fibrillation  No Known Allergies  Patient Measurements: Height: 5\' 9"  (175.3 cm) Weight: 164 lb 10.9 oz (74.7 kg) IBW/kg (Calculated) : 70.7 Heparin Dosing Weight: 74.7 kg  Vital Signs: Temp: 97.9 F (36.6 C) (04/24 2020) Temp Source: Oral (04/24 2020) BP: 85/73 (04/25 0628) Pulse Rate: 159 (04/25 0628)  Labs: Recent Labs    12/03/17 0559 12/04/17 0443 12/05/17 0459  HGB 9.3* 9.2* 9.2*  HCT 29.9* 29.4* 29.1*  PLT 307 291 283  CREATININE 3.42* 3.21* 2.82*  TROPONINI  --   --  0.06*    Estimated Creatinine Clearance: 20.2 mL/min (A) (by C-G formula based on SCr of 2.82 mg/dL (H)).   Medical History: Past Medical History:  Diagnosis Date  . Arteriosclerosis of coronary artery 01/27/2014  . Benign essential HTN 12/11/2013  . Carpal tunnel syndrome   . Coronary atherosclerosis of native coronary artery   . Disorder of mitral valve 12/11/2013  . Encounter for long-term (current) use of antiplatelets/antithrombotics   . Gout   . Hyperlipidemia   . Hypertension   . Lung nodule    found on xray in Aug 2018  . Mitral valve disorder   . Osteoarthritis   . Prostate cancer ([place])     Medications:  Scheduled:  . aspirin  325 mg Oral Daily  . atorvastatin  40 mg Oral Daily  . finasteride  5 mg Oral Daily  . heparin  5,000 Units Subcutaneous Q8H  . I-VITE  1 tablet Oral Daily  . loratadine  10 mg Oral Daily  . morphine      . pantoprazole  40 mg Oral Daily  . potassium chloride  40 mEq Oral Q4H    Assessment: Patient admitted for dizziness, leg swelling, and SOB went into afib. Pharmacy has been consulted for heparin dosing  Goal of Therapy:  Heparin level 0.3-0.7 units/ml Monitor platelets by anticoagulation protocol: Yes   Plan:  Patient was on subq heparin 5000 units q8h for VTE prophylaxis and received 3 doses yesterday. Will start drip without bolus @ 1050  units/hr and will check a HL @ 1600. Baseline orders drawn Will continue to monitor daily CBCs and will adjust per HL's  Tobie Lords, PharmD, BCPS Clinical Pharmacist 12/05/2017

## 2017-12-05 NOTE — Progress Notes (Signed)
[facility] [place] NAME: Andrew [name]    MR#:  [id number]  DATE OF BIRTH:  1933-06-13  SUBJECTIVE:  CHIEF COMPLAINT:   Chief Complaint  Patient presents with  . Dizziness  . Leg Swelling  . Shortness of Breath  developed Rapid A.fib last night, hypotension, breadycardic REVIEW OF SYSTEMS:  Review of Systems  Constitutional: Negative for chills, fever and weight loss.  HENT: Negative for nosebleeds and sore throat.   Eyes: Negative for blurred vision.  Respiratory: Negative for cough, shortness of breath and wheezing.   Cardiovascular: Positive for leg swelling. Negative for chest pain, orthopnea and PND.  Gastrointestinal: Negative for abdominal pain, constipation, diarrhea, heartburn, nausea and vomiting.  Genitourinary: Negative for dysuria and urgency.  Musculoskeletal: Negative for back pain.  Skin: Negative for rash.  Neurological: Negative for dizziness, speech change, focal weakness and headaches.  Endo/Heme/Allergies: Does not bruise/bleed easily.  Psychiatric/Behavioral: Negative for depression.   DRUG ALLERGIES:  No Known Allergies VITALS:  Blood pressure 94/69, pulse (!) 38, temperature 97.9 F (36.6 C), temperature source Oral, resp. rate (!) 22, height 5\' 9"  (1.753 m), weight 74.7 kg (164 lb 10.9 oz), SpO2 98 %. PHYSICAL EXAMINATION:  Physical Exam  Constitutional: He is oriented to person, place, and time.  HENT:  Head: Normocephalic and atraumatic.  Eyes: Pupils are equal, round, and reactive to light. Conjunctivae and EOM are normal.  Neck: Normal range of motion. Neck supple. No tracheal deviation present. No thyromegaly present.  Cardiovascular: Normal rate, regular rhythm and normal heart sounds.  Pulmonary/Chest: Effort normal and breath sounds normal. No respiratory distress. He has no wheezes. He exhibits no tenderness.  Abdominal: Soft. Bowel sounds are normal. He exhibits no distension. There is no  tenderness.  Musculoskeletal: Normal range of motion. He exhibits edema.  Neurological: He is alert and oriented to person, place, and time. No cranial nerve deficit.  Skin: Skin is warm and dry. No rash noted.   LABORATORY PANEL:  Male CBC Recent Labs  Lab 12/05/17 0459  WBC 6.2  HGB 9.2*  HCT 29.1*  PLT 283   ------------------------------------------------------------------------------------------------------------------ Chemistries  Recent Labs  Lab 12/03/17 0559  12/05/17 0459  NA 138   < > 139  K 4.7   < > 3.1*  CL 108   < > 107  CO2 22   < > 23  GLUCOSE 99   < > 103*  BUN 95*   < > 91*  CREATININE 3.42*   < > 2.82*  CALCIUM 8.3*   < > 7.9*  MG  --   --  2.2  AST 24  --   --   ALT 30  --   --   ALKPHOS 163*  --   --   BILITOT 2.0*  --   --    < > = values in this interval not displayed.   RADIOLOGY:  No results found. ASSESSMENT AND PLAN:  84 y.o.malewith a known history of multiple medical problems including CAD, hypertension, hyperlipidemia, CKD, prostate cancer and a lung nodule.  The patient came to ED due to dizziness and leg swelling for 1 week  * Acute renal failure on CKD stage 3. Creat 3.6-> 3.1 - lasix drip per Nephro  * AFRVR and multiple runs of VT - converted to NSR with Amiodarone - cardio c/s pending  * Hypertension.  Hold torsemide due to low blood pressure.  * CAD.  Continue aspirin and  atorvastatin.  * Hyperlipidemia.  Continue atorvastatin.  * Anemia of chronic kidney disease.  Stable.  * LE swelling: likely gouty arthritis, appreciate Rheum, Podiatry and Vascular input.  * acute on chronic systolic & diastolic CHF: On lasix drip per Nephro Monitor I & Os Daily wt -cardio following   At DC - home with [facility], PT, RN - [facility]     All the records are reviewed and case discussed with Care Management/Social Worker. Management plans discussed with the patient, nursing, family at bedside and they are in agreement.  CODE STATUS:  DNR  TOTAL TIME TAKING CARE OF THIS PATIENT: 35 minutes.   More than 50% of the time was spent in counseling/coordination of care: YES  POSSIBLE D/C IN 1-2 DAYS, DEPENDING ON CLINICAL CONDITION.   Max Sane M.D on 12/05/2017 at 4:33 PM  Between 7am to 6pm - Pager - [phone]  After 6pm go to [email or web address] - Proofreader  [facility]  Office  [phone]  CC: Primary care physician; [name], MD  Note: This dictation was prepared with Dragon dictation along with smaller phrase technology. Any transcriptional errors that result from this process are unintentional.

## 2017-12-05 NOTE — Consult Note (Signed)
[facility] Cardiology  CARDIOLOGY CONSULT NOTE  Patient ID: Andrew [name] MRN: [id number] DOB/AGE: [birth year] 82 y.o.  Admit date: 12/01/2017 Referring Physician Alva Garnet Primary Physician [facility] Primary Cardiologist Fath Reason for Consultation atrial fibrillation with rapid ventricular rate  HPI: 84 year old gentleman referred for evaluation of atrial fibrillation with rapid ventricular rate.  Patient was admitted 12/01/2017 with edema and dizziness and was noted to be in acute renal failure with BUN and creatinine 104 and 3.75, respectively.  Patient was treated with Lasix drip.  12/05/2017, the patient noted chest discomfort, was found to be in atrial fibrillation with rapid ventricular rate, and transferred to the ICU.  Patient was started on amiodarone drip, and converted to sinus rhythm.  Currently denies chest pain.  Troponin is 0.06.  He has known coronary disease, status post drug-eluting stent left circumflex.  2D echocardiogram 10/28/2017 revealed LVEF of 35% with severe mitral regurgitation.  Review of systems complete and found to be negative unless listed above     Past Medical History:  Diagnosis Date  . Arteriosclerosis of coronary artery 01/27/2014  . Benign essential HTN 12/11/2013  . Carpal tunnel syndrome   . Coronary atherosclerosis of native coronary artery   . Disorder of mitral valve 12/11/2013  . Encounter for long-term (current) use of antiplatelets/antithrombotics   . Gout   . Hyperlipidemia   . Hypertension   . Lung nodule    found on xray in Aug 2018  . Mitral valve disorder   . Osteoarthritis   . Prostate cancer [facility])     Past Surgical History:  Procedure Laterality Date  . COLONOSCOPY    . COLONOSCOPY WITH PROPOFOL N/A 07/23/2016   Procedure: COLONOSCOPY WITH PROPOFOL;  Surgeon: Manya Silvas, MD;  Location: [facility] ENDOSCOPY;  Service: Endoscopy;  Laterality: N/A;  . CORONARY ANGIOPLASTY WITH STENT PLACEMENT    . ESOPHAGOGASTRODUODENOSCOPY Left 07/28/2017    Procedure: ESOPHAGOGASTRODUODENOSCOPY (EGD);  Surgeon: Virgel Manifold, MD;  Location: [facility] ENDOSCOPY;  Service: Endoscopy;  Laterality: Left;  . ESOPHAGOGASTRODUODENOSCOPY (EGD) WITH PROPOFOL N/A 07/23/2016   Procedure: ESOPHAGOGASTRODUODENOSCOPY (EGD) WITH PROPOFOL;  Surgeon: Manya Silvas, MD;  Location: [facility] ENDOSCOPY;  Service: Endoscopy;  Laterality: N/A;  . PROSTATE BIOPSY      Medications Prior to Admission  Medication Sig Dispense Refill Last Dose  . aspirin 325 MG tablet Take 325 mg by mouth daily.    12/01/2017 at 0800  . atorvastatin (LIPITOR) 40 MG tablet Take 40 mg by mouth daily.  6 12/01/2017 at 0800  . finasteride (PROSCAR) 5 MG tablet Take 5 mg by mouth daily.  11 12/01/2017 at 0800  . loratadine (CLARITIN) 10 MG tablet Take 1 tablet (10 mg total) by mouth daily. 30 tablet 0 12/01/2017 at 0800  . omeprazole (PRILOSEC) 20 MG capsule Take 1 capsule (20 mg total) by mouth 2 (two) times daily. 60 capsule 1 12/01/2017 at 0800  . torsemide (DEMADEX) 20 MG tablet Take 2 tablets (40 mg total) by mouth daily. 60 tablet 0 12/01/2017 at 0800   Social History   Socioeconomic History  . Marital status: Married    Spouse name: Not on file  . Number of children: Not on file  . Years of education: Not on file  . Highest education level: Not on file  Occupational History  . Not on file  Social Needs  . Financial resource strain: Not on file  . Food insecurity:    Worry: Not on file    Inability: Not on file  .  Transportation needs:    Medical: Not on file    Non-medical: Not on file  Tobacco Use  . Smoking status: Never Smoker  . Smokeless tobacco: Never Used  Substance and Sexual Activity  . Alcohol use: No  . Drug use: No  . Sexual activity: Never  Lifestyle  . Physical activity:    Days per week: Not on file    Minutes per session: Not on file  . Stress: Not on file  Relationships  . Social connections:    Talks on phone: Not on file    Gets together: Not on file     Attends religious service: Not on file    Active member of club or organization: Not on file    Attends meetings of clubs or organizations: Not on file    Relationship status: Not on file  . Intimate partner violence:    Fear of current or ex partner: Not on file    Emotionally abused: Not on file    Physically abused: Not on file    Forced sexual activity: Not on file  Other Topics Concern  . Not on file  Social History Narrative  . Not on file    Family History  Problem Relation Age of Onset  . Cancer Mother        stomach and uterus  . Cancer Sister        breast      Review of systems complete and found to be negative unless listed above      PHYSICAL EXAM  General: Well developed, well nourished, in no acute distress HEENT:  Normocephalic and atramatic Neck:  No JVD.  Lungs: Clear bilaterally to auscultation and percussion. Heart: HRRR . Normal S1 and S2 without gallops or murmurs.  Abdomen: Bowel sounds are positive, abdomen soft and non-tender  Msk:  Back normal, normal gait. Normal strength and tone for age. Extremities: No clubbing, cyanosis or edema.   Neuro: Alert and oriented X 3. Psych:  Good affect, responds appropriately  Labs:   Lab Results  Component Value Date   WBC 6.2 12/05/2017   HGB 9.2 (L) 12/05/2017   HCT 29.1 (L) 12/05/2017   MCV 71.2 (L) 12/05/2017   PLT 283 12/05/2017    Recent Labs  Lab 12/03/17 0559  12/05/17 0459  NA 138   < > 139  K 4.7   < > 3.1*  CL 108   < > 107  CO2 22   < > 23  BUN 95*   < > 91*  CREATININE 3.42*   < > 2.82*  CALCIUM 8.3*   < > 7.9*  PROT 5.5*  --   --   BILITOT 2.0*  --   --   ALKPHOS 163*  --   --   ALT 30  --   --   AST 24  --   --   GLUCOSE 99   < > 103*   < > = values in this interval not displayed.   Lab Results  Component Value Date   CKTOTAL 137 07/08/2013   CKMB 2.8 07/08/2013   TROPONINI 0.06 (HH) 12/05/2017   No results found for: CHOL No results found for: HDL No results  found for: LDLCALC No results found for: TRIG No results found for: CHOLHDL No results found for: LDLDIRECT    Radiology: Dg Chest 2 View  Result Date: 12/01/2017 CLINICAL DATA:  Patient reports SOB, dizziness and bilateral LE swelling onset 2  days ago. Hx prostate ca, mitral valve disorder, lung nodule, HTN, cardiac stent placement. Non-smoker. EXAM: CHEST - 2 VIEW COMPARISON:  10/31/2017 FINDINGS: Heart is enlarged and stable in configuration. There no focal consolidations. Small RIGHT pleural effusion or pleural thickening persists and is smaller. No pulmonary edema. IMPRESSION: 1. Stable cardiomegaly. 2. Smaller RIGHT pleural effusion. Electronically Signed   By: Nolon Nations M.D.   On: 12/01/2017 11:06   US Renal  Result Date: 12/01/2017 CLINICAL DATA:  Renal failure, acute on chronic EXAM: RENAL / URINARY TRACT ULTRASOUND COMPLETE COMPARISON:  Ultrasound 04/09/2017.  MRI 05/28/2017 FINDINGS: Right Kidney: Length: 10.3 cm. Multiple cysts, the largest 7.2 cm in the lower pole with thin septations. No hydronephrosis. Diffusely increased echotexture Left Kidney: Length: 10.9 cm. Multiple cysts, the largest 15 mm in the midpole. Increased echotexture. No hydronephrosis. Bladder: Slight wall irregularity and thickening.  Prostate enlargement. IMPRESSION: No hydronephrosis. Increased echotexture in the kidneys bilaterally compatible chronic medical renal disease. Bilateral benign appearing renal cysts. Mild bladder wall thickening, likely related to prostate enlargement. Electronically Signed   By: Rolm Baptise M.D.   On: 12/01/2017 17:22   Mr Foot Left Wo Contrast  Result Date: 12/03/2017 CLINICAL DATA:  Left foot and toe pain over the past few weeks. EXAM: MRI OF THE LEFT FOOT WITHOUT CONTRAST TECHNIQUE: Multiplanar, multisequence MR imaging of the left mid and forefoot was performed. No intravenous contrast was administered. COMPARISON:  Same day great toe radiographs. FINDINGS:  Bones/Joint/Cartilage Osteoarthritis of the interphalangeal, first MTP and TMT joints of the great toe. Associated subchondral degenerative cystic change is noted involving the base and head of the first metatarsal and across the interphalangeal joint of the great toe. Lesser degrees of degenerative joint space narrowing of the DIP and PIP joints of the second through fifth digits. Areas of minimal edema involving the tuft of the great toe and cortical destruction of the middle phalanx of the second toe with minimal edema raise concern for early changes of osteomyelitis versus erosive changes possibly from an inflammatory arthropathy. Ligaments Noncontributory Muscles and Tendons No intramuscular fluid collection or abscess. No significant muscle atrophy. No evidence of a tenosynovitis. The tarsal Soft tissues Soft tissue edema and swelling is noted along the dorsum of the mid and forefoot. No focal fluid collections are identified. IMPRESSION: 1. Osteoarthritis of the forefoot as above more notably involving the interphalangeal, first MTP and TMT joints with joint space narrowing and subchondral cystic change. 2. Lesser degrees of osteoarthritic joint space narrowing is seen of the DIP and PIP joints of the second through fifth digits and fourth TMT joints. 3. Faint marrow edema involving the tuft of the great toe cannot exclude posttraumatic or reactive edema versus early changes of an osteomyelitis. 4. Cortical irregularity of the second middle phalangeal head with slight marrow edema also raise concern for changes of osteomyelitis although changes from inflammatory arthropathy may also have a similar appearance. Correlate with dedicated foot and/or toe radiographs. 5. Soft tissue swelling over the dorsum of the included foot. Electronically Signed   By: Ashley Royalty M.D.   On: 12/03/2017 00:31   US Venous Img Lower Bilateral  Result Date: 12/02/2017 CLINICAL DATA:  Swelling in both legs, pain, history  prostate cancer EXAM: BILATERAL LOWER EXTREMITY VENOUS DOPPLER ULTRASOUND TECHNIQUE: Gray-scale sonography with graded compression, as well as color Doppler and duplex ultrasound were performed to evaluate the lower extremity deep venous systems from the level of the common femoral vein and including the  common femoral, femoral, profunda femoral, popliteal and calf veins including the posterior tibial, peroneal and gastrocnemius veins when visible. The superficial great saphenous vein was also interrogated. Spectral Doppler was utilized to evaluate flow at rest and with distal augmentation maneuvers in the common femoral, femoral and popliteal veins. COMPARISON:  None. FINDINGS: RIGHT LOWER EXTREMITY Common Femoral Vein: No evidence of thrombus. Normal compressibility, respiratory phasicity and response to augmentation. Saphenofemoral Junction: No evidence of thrombus. Normal compressibility and flow on color Doppler imaging. Profunda Femoral Vein: No evidence of thrombus. Normal compressibility and flow on color Doppler imaging. Femoral Vein: No evidence of thrombus. Normal compressibility, respiratory phasicity and response to augmentation. Popliteal Vein: No evidence of thrombus. Normal compressibility, respiratory phasicity and response to augmentation. Calf Veins: No evidence of thrombus. Normal compressibility and flow on color Doppler imaging. Superficial Great Saphenous Vein: No evidence of thrombus. Normal compressibility. Venous Reflux:  None. Other Findings:  None. LEFT LOWER EXTREMITY Common Femoral Vein: No evidence of thrombus. Normal compressibility, respiratory phasicity and response to augmentation. Saphenofemoral Junction: No evidence of thrombus. Normal compressibility and flow on color Doppler imaging. Profunda Femoral Vein: No evidence of thrombus. Normal compressibility and flow on color Doppler imaging. Femoral Vein: No evidence of thrombus. Normal compressibility, respiratory phasicity and  response to augmentation. Popliteal Vein: No evidence of thrombus. Normal compressibility, respiratory phasicity and response to augmentation. Calf Veins: No evidence of thrombus. Normal compressibility and flow on color Doppler imaging. Superficial Great Saphenous Vein: No evidence of thrombus. Normal compressibility. Venous Reflux:  None. Other Findings:  None. IMPRESSION: No evidence of deep venous thrombosis in either lower extremity. Electronically Signed   By: Lavonia Dana M.D.   On: 12/02/2017 14:15   Dg Toe Great Left  Result Date: 12/02/2017 CLINICAL DATA:  Left great toe pain swelling. EXAM: LEFT GREAT TOE COMPARISON:  None. FINDINGS: No acute osseous abnormality. Chondrocalcinosis is seen in the first metatarsophalangeal joint. IMPRESSION: Chondrocalcinosis in the first metatarsophalangeal joint can be seen with calcium pyrophosphate deposition disease. Electronically Signed   By: Lorin Picket M.D.   On: 12/02/2017 16:45    EKG: Atrial fibrillation with rapid ventricular rate, ST depression laterally  ASSESSMENT AND PLAN:   1.  Atrial fibrillation with rapid ventricular rate, in the setting of acute renal failure, and acute on chronic systolic congestive heart failure, converted to sinus rhythm on amiodarone drip 2.  Known CAD, status post DES left circumflex, currently without chest pain, troponin borderline elevated 0.06 3.  Acute on chronic renal failure 4.  Acute on chronic systolic congestive heart failure 5.  Severe mitral regurgitation  Recommendations  1.  Agree with overall current therapy 2.  Continue amiodarone drip 3.  We will likely transition to p.o. amiodarone 12/06/2017 4.  Continue heparin drip for now 5.  Likely transition to low-dose apixaban 12/06/2017 per nephrology 6.  Defer cardiac catheterization in the absence of chest pain  Signed: Isaias Cowman MD,PhD, [facility] 12/05/2017, 4:31 PM

## 2017-12-05 NOTE — Progress Notes (Addendum)
ANTICOAGULATION CONSULT NOTE - Initial Consult  Pharmacy Consult for heparin Indication: atrial fibrillation  No Known Allergies  Patient Measurements: Height: 5\' 9"  (175.3 cm) Weight: 164 lb 10.9 oz (74.7 kg) IBW/kg (Calculated) : 70.7 Heparin Dosing Weight: 74.7 kg  Vital Signs: Temp: 96.9 F (36.1 C) (04/25 1600) Temp Source: Oral (04/25 1600) BP: 90/63 (04/25 2236)  Labs: Recent Labs    12/03/17 0559 12/04/17 0443 12/05/17 0459 12/05/17 0720 12/05/17 1757  HGB 9.3* 9.2* 9.2*  --   --   HCT 29.9* 29.4* 29.1*  --   --   PLT 307 291 283  --   --   APTT  --   --   --  38*  --   LABPROT  --   --   --  15.0  --   INR  --   --   --  1.19  --   HEPARINUNFRC  --   --   --   --  2.02*  CREATININE 3.42* 3.21* 2.82*  --   --   TROPONINI  --   --  0.06*  --   --     Estimated Creatinine Clearance: 20.2 mL/min (A) (by C-G formula based on SCr of 2.82 mg/dL (H)).   Medical History: Past Medical History:  Diagnosis Date  . Arteriosclerosis of coronary artery 01/27/2014  . Benign essential HTN 12/11/2013  . Carpal tunnel syndrome   . Coronary atherosclerosis of native coronary artery   . Disorder of mitral valve 12/11/2013  . Encounter for long-term (current) use of antiplatelets/antithrombotics   . Gout   . Hyperlipidemia   . Hypertension   . Lung nodule    found on xray in Aug 2018  . Mitral valve disorder   . Osteoarthritis   . Prostate cancer ([place])     Medications:  Scheduled:  . aspirin  325 mg Oral Daily  . atorvastatin  40 mg Oral Daily  . finasteride  5 mg Oral Daily  . I-VITE  1 tablet Oral Daily  . pantoprazole  40 mg Oral Daily    Assessment: Patient admitted for dizziness, leg swelling, and SOB went into afib. Pharmacy has been consulted for heparin dosing  Goal of Therapy:  Heparin level 0.3-0.7 units/ml Monitor platelets by anticoagulation protocol: Yes   Plan:  Patient was on subq heparin 5000 units q8h for VTE prophylaxis and received 3  doses yesterday. Will start drip without bolus @ 1050 units/hr and will check a HL @ 1600. Baseline orders drawn Will continue to monitor daily CBCs and will adjust per HL's  4/25:  HL @ 17:00 = 2.02.   May have been drawn incorrectly,  Lab will redraw.  04/25 @ 2345 aPTT > 160 seconds, HL 1.81. Will hold heparin drip for 1 hour and will restart rate @ 900 units/hr @ 0100. Will recheck HL/aPTT @ 0900. Will f/u on CBC w/ am labs.  Tobie Lords, PharmD, BCPS Clinical Pharmacist 12/05/2017

## 2017-12-05 NOTE — Progress Notes (Signed)
Pharmacy Electrolyte Monitoring Consult:  Pharmacy consulted to assist in monitoring and replacing electrolytes in this 84 y.o. male admitted on 12/01/2017 with Dizziness; Leg Swelling; and Shortness of Breath  Patient currently ordered furosemide drip at 8mg /hr.   Patient received potassium 14mEq PO x 1 at 0641.   Labs:  Sodium (mmol/L)  Date Value  12/05/2017 139  07/09/2013 139   Potassium (mmol/L)  Date Value  12/05/2017 3.1 (L)  07/09/2013 3.6   Magnesium (mg/dL)  Date Value  12/05/2017 2.2   Phosphorus (mg/dL)  Date Value  12/04/2017 4.0   Calcium (mg/dL)  Date Value  12/05/2017 7.9 (L)   Calcium, Total (mg/dL)  Date Value  07/09/2013 8.3 (L)   Albumin (g/dL)  Date Value  12/04/2017 2.7 (L)    Plan: Patient unable to take oral medications. Potassium order changed to potassium 28mEq IV Q1hr x 4.   If patient can tolerate oral medications, will replace electrolytes orally.   Will check electrolytes Q12hr while patient is on furosemide drip.   Simpson,Michael L 12/05/2017 3:40 PM

## 2017-12-05 NOTE — Progress Notes (Signed)
Patient complained of experiencing chest pain while having a bowel movement. Pain 10/10. Took vitals, and EKG. Notified Dr. Jodell Cipro and he order Morphine 4mg , EKG, troponins. BP after getting Morphine was 85/73 and heart rate 159, pain 3/10. Patient complained of nausea and was given Zofran. Administered Magnesium and potassium as order by doctor. Patient transferred to ICU as ordered.

## 2017-12-05 NOTE — Progress Notes (Addendum)
Per hospitalist, pt stated that he wanted to be a full code. However, the day before when the patient was alert and oriented the patient stated that he wanted to be a DNR. This was relayed to Dr. Alva Garnet. Pt has not been alert and has been intermittently disoriented. No changes made.

## 2017-12-06 LAB — CBC
HEMATOCRIT: 31.9 % — AB (ref 40.0–52.0)
HEMOGLOBIN: 9.8 g/dL — AB (ref 13.0–18.0)
MCH: 22.2 pg — ABNORMAL LOW (ref 26.0–34.0)
MCHC: 30.7 g/dL — ABNORMAL LOW (ref 32.0–36.0)
MCV: 72.4 fL — AB (ref 80.0–100.0)
Platelets: 282 10*3/uL (ref 150–440)
RBC: 4.4 MIL/uL (ref 4.40–5.90)
RDW: 20.8 % — AB (ref 11.5–14.5)
WBC: 16.9 10*3/uL — AB (ref 3.8–10.6)

## 2017-12-06 LAB — RENAL FUNCTION PANEL
ANION GAP: 17 — AB (ref 5–15)
Albumin: 2.7 g/dL — ABNORMAL LOW (ref 3.5–5.0)
BUN: 98 mg/dL — ABNORMAL HIGH (ref 6–20)
CHLORIDE: 103 mmol/L (ref 101–111)
CO2: 17 mmol/L — AB (ref 22–32)
CREATININE: 4.28 mg/dL — AB (ref 0.61–1.24)
Calcium: 8.6 mg/dL — ABNORMAL LOW (ref 8.9–10.3)
GFR, EST AFRICAN AMERICAN: 14 mL/min — AB (ref >60)
GFR, EST NON AFRICAN AMERICAN: 12 mL/min — AB (ref >60)
Glucose, Bld: 78 mg/dL (ref 65–99)
POTASSIUM: 5.5 mmol/L — AB (ref 3.5–5.1)
Phosphorus: 6.6 mg/dL — ABNORMAL HIGH (ref 2.5–4.6)
Sodium: 137 mmol/L (ref 135–145)

## 2017-12-06 LAB — TSH: TSH: 3.663 u[IU]/mL (ref 0.350–4.500)

## 2017-12-06 LAB — HEPARIN LEVEL (UNFRACTIONATED): HEPARIN UNFRACTIONATED: 1.81 [IU]/mL — AB (ref 0.30–0.70)

## 2017-12-06 LAB — MAGNESIUM: MAGNESIUM: 2.8 mg/dL — AB (ref 1.7–2.4)

## 2017-12-06 MED ORDER — AMIODARONE HCL 200 MG PO TABS
400.0000 mg | ORAL_TABLET | Freq: Two times a day (BID) | ORAL | Status: DC
  Administered 2017-12-06 – 2017-12-07 (×3): 400 mg via ORAL
  Filled 2017-12-06 (×3): qty 2

## 2017-12-06 MED ORDER — APIXABAN 2.5 MG PO TABS
2.5000 mg | ORAL_TABLET | Freq: Two times a day (BID) | ORAL | Status: DC
  Administered 2017-12-06 – 2017-12-09 (×7): 2.5 mg via ORAL
  Filled 2017-12-06 (×7): qty 1

## 2017-12-06 MED ORDER — TORSEMIDE 20 MG PO TABS
20.0000 mg | ORAL_TABLET | Freq: Every day | ORAL | Status: DC
  Administered 2017-12-06: 20 mg via ORAL
  Filled 2017-12-06: qty 1

## 2017-12-06 NOTE — Progress Notes (Signed)
[facility]  ROUNDING NOTE   Subjective:   Transferred to 2A   Family at bedside.  Off furosemide gtt.   Objective:  Vital signs in last 24 hours:  Temp:  [96.9 F (36.1 C)-98 F (36.7 C)] 98 F (36.7 C) (04/26 0635) Pulse Rate:  [66-120] 120 (04/26 0635) Resp:  [14-27] 17 (04/26 0500) BP: (90-109)/(58-80) 98/60 (04/26 0635) SpO2:  [88 %-100 %] 95 % (04/26 1121) Weight:  [74.9 kg (165 lb 1.6 oz)-76.3 kg (168 lb 3.4 oz)] 74.9 kg (165 lb 1.6 oz) (04/26 0635)  Weight change:  Filed Weights   12/04/17 0500 12/06/17 0413 12/06/17 0635  Weight: 74.7 kg (164 lb 10.9 oz) 76.3 kg (168 lb 3.4 oz) 74.9 kg (165 lb 1.6 oz)    Intake/Output: I/O last 3 completed shifts: In: 1092.4 [P.O.:120; I.V.:972.4] Out: 1010 [Urine:1010]   Intake/Output this shift:  No intake/output data recorded.  Physical Exam: General: NAD,   Head: Normocephalic, atraumatic. Moist oral mucosal membranes  Eyes: Anicteric, PERRL  Neck: Supple, trachea midline  Lungs:  Clear to auscultation  Heart: Regular rate and rhythm  Abdomen:  Soft, nontender,   Extremities:  + peripheral edema.  Neurologic: Nonfocal, moving all four extremities  Skin: No lesions        Basic Metabolic Panel: Recent Labs  Lab 12/01/17 1004 12/02/17 0509 12/03/17 0559 12/04/17 0443 12/05/17 0459 12/05/17 1757 12/06/17 1005  NA 135 137 138 138 139  --   --   K 5.0 4.7 4.7 4.2 3.1* 4.6  --   CL 104 106 108 108 107  --   --   CO2 20* 21* 22 22 23   --   --   GLUCOSE 106* 108* 99 139* 103*  --   --   BUN 104* 96* 95* 92* 91*  --   --   CREATININE 3.75* 3.62* 3.42* 3.21* 2.82*  --   --   CALCIUM 8.8* 8.3* 8.3* 8.1* 7.9*  --   --   MG  --   --   --   --  2.2 2.9* 2.8*  PHOS  --   --   --  4.0  --   --   --     Liver Function Tests: Recent Labs  Lab 12/03/17 0559 12/04/17 0443  AST 24  --   ALT 30  --   ALKPHOS 163*  --   BILITOT 2.0*  --   PROT 5.5*  --   ALBUMIN 2.8* 2.7*   No results for  input(s): LIPASE, AMYLASE in the last 168 hours. No results for input(s): AMMONIA in the last 168 hours.  CBC: Recent Labs  Lab 12/01/17 1004 12/03/17 0559 12/04/17 0443 12/05/17 0459 12/06/17 1005  WBC 7.6 7.8 6.9 6.2 16.9*  HGB 9.6* 9.3* 9.2* 9.2* 9.8*  HCT 31.0* 29.9* 29.4* 29.1* 31.9*  MCV 71.8* 71.5* 71.7* 71.2* 72.4*  PLT 332 307 291 283 282    Cardiac Enzymes: Recent Labs  Lab 12/01/17 1004 12/05/17 0459  TROPONINI 0.05* 0.06*    BNP: Invalid input(s): POCBNP  CBG: Recent Labs  Lab 12/05/17 3244  WNUUVO 53    Microbiology: Results for orders placed or performed during the hospital encounter of 12/01/17  MRSA PCR Screening     Status: None   Collection Time: 12/05/17  8:00 AM  Result Value Ref Range Status   MRSA by PCR NEGATIVE NEGATIVE Final    Comment:  The GeneXpert MRSA Assay (FDA approved for NASAL specimens only), is one component of a comprehensive MRSA colonization surveillance program. It is not intended to diagnose MRSA infection nor to guide or monitor treatment for MRSA infections. Performed at [facility], [address] [place], [place] [address]     Coagulation Studies: Recent Labs    12/05/17 0720  LABPROT 15.0  INR 1.19    Urinalysis: No results for input(s): COLORURINE, LABSPEC, PHURINE, GLUCOSEU, HGBUR, BILIRUBINUR, KETONESUR, PROTEINUR, UROBILINOGEN, NITRITE, LEUKOCYTESUR in the last 72 hours.  Invalid input(s): APPERANCEUR    Imaging: No results found.   Medications:    . amiodarone  400 mg Oral BID  . apixaban  2.5 mg Oral BID  . aspirin  325 mg Oral Daily  . atorvastatin  40 mg Oral Daily  . finasteride  5 mg Oral Daily  . I-VITE  1 tablet Oral Daily  . pantoprazole  40 mg Oral Daily  . torsemide  20 mg Oral Daily   acetaminophen **OR** acetaminophen, albuterol, bisacodyl, metoprolol tartrate, morphine injection **OR** morphine injection, ondansetron **OR** ondansetron (ZOFRAN) IV,  senna-docusate  Assessment/ Plan:  [name] is a 84 y.o. black male with hypertension, hyperlipidemia, gout, history of prostate cancer, coronary artery disease, who was admitted to [facility] on 12/01/2017    1. Acute renal failure on chronic kidney disease stage III with bland urinalysis. Baseline creatinine of 2.1, GFR of 37 on 10/15/17. Progressive renal failure in the last month.  - Transition to PO torsemide 20mg  daily  2. Hypertension: with diastolic dysfunction. Atrial fibrillation with RVR. Started on amiodarone gtt and heparin gtt.  Significant peripheral edema. Home regimen of torsemide.  - torsemide 20mg  daily  3. Gout: with hyperuricemia.  - Appreciate rheumatology input - Continue allopurinol.   4. Anemia with chronic kidney disease: microcytic. Iron studies in 12/18 acceptable. hemoglobin 9.8   LOS: 5 Andrew [name] 4/26/201912:25 PM

## 2017-12-06 NOTE — Progress Notes (Signed)
  Amiodarone Drug - Drug Interaction Consult Note  Recommendations: None currently.  Amiodarone is metabolized by the cytochrome P450 system and therefore has the potential to cause many drug interactions. Amiodarone has an average plasma half-life of 50 days (range 20 to 100 days).   There is potential for drug interactions to occur several weeks or months after stopping treatment and the onset of drug interactions may be slow after initiating amiodarone.   [x]  Statins: Increased risk of myopathy. Simvastatin- restrict dose to 20mg  daily. Other statins: counsel patients to report any muscle pain or weakness immediately. Patient on Atorvastatin  []  Anticoagulants: Amiodarone can increase anticoagulant effect. Consider warfarin dose reduction. Patients should be monitored closely and the dose of anticoagulant altered accordingly, remembering that amiodarone levels take several weeks to stabilize.  []  Antiepileptics: Amiodarone can increase plasma concentration of phenytoin, the dose should be reduced. Note that small changes in phenytoin dose can result in large changes in levels. Monitor patient and counsel on signs of toxicity.  []  Beta blockers: increased risk of bradycardia, AV block and myocardial depression. Sotalol - avoid concomitant use.  []   Calcium channel blockers (diltiazem and verapamil): increased risk of bradycardia, AV block and myocardial depression.  []   Cyclosporine: Amiodarone increases levels of cyclosporine. Reduced dose of cyclosporine is recommended.  []  Digoxin dose should be halved when amiodarone is started.  [x]  Diuretics: increased risk of cardiotoxicity if hypokalemia occurs.  Patient was on lasix drip  []  Oral hypoglycemic agents (glyburide, glipizide, glimepiride): increased risk of hypoglycemia. Patient's glucose levels should be monitored closely when initiating amiodarone therapy.   []  Drugs that prolong the QT interval:  Torsades de pointes risk may  be increased with concurrent use - avoid if possible.  Monitor QTc, also keep magnesium/potassium WNL if concurrent therapy can't be avoided. Marland Kitchen Antibiotics: e.g. fluoroquinolones, erythromycin. . Antiarrhythmics: e.g. quinidine, procainamide, disopyramide, sotalol. . Antipsychotics: e.g. phenothiazines, haloperidol.  . Lithium, tricyclic antidepressants, and methadone.  Thank You,  Samba Cumba A  12/06/2017 9:33 AM

## 2017-12-06 NOTE — Progress Notes (Signed)
Pharmacy Electrolyte Monitoring Consult:  Pharmacy consulted to assist in monitoring and replacing electrolytes in this 84 y.o. male admitted on 12/01/2017 with Dizziness; Leg Swelling; and Shortness of Breath    Labs:  Sodium (mmol/L)  Date Value  12/05/2017 139  07/09/2013 139   Potassium (mmol/L)  Date Value  12/05/2017 4.6  07/09/2013 3.6   Magnesium (mg/dL)  Date Value  12/05/2017 2.9 (H)   Phosphorus (mg/dL)  Date Value  12/04/2017 4.0   Calcium (mg/dL)  Date Value  12/05/2017 7.9 (L)   Calcium, Total (mg/dL)  Date Value  07/09/2013 8.3 (L)   Albumin (g/dL)  Date Value  12/04/2017 2.7 (L)    Plan: Patient unable to take oral medications. Potassium order changed to potassium 54mEq IV Q1hr x 4.  If patient can tolerate oral medications, will replace electrolytes orally.  Will check electrolytes Q12hr while patient is on furosemide drip.   4/26: Lasix drip discontinued. Labs pending  Brittani Purdum A 12/06/2017 9:30 AM

## 2017-12-06 NOTE — Care Management Important Message (Signed)
Copy of signed IM left in patient's room.    

## 2017-12-06 NOTE — Progress Notes (Signed)
[facility] [place] NAME: Esgar Barnick    MR#:  [id number]  DATE OF BIRTH:  [birth year]  SUBJECTIVE:  CHIEF COMPLAINT:   Chief Complaint  Patient presents with  . Dizziness  . Leg Swelling  . Shortness of Breath  feels better, out of ICU, remains in NSR REVIEW OF SYSTEMS:  Review of Systems  Constitutional: Negative for chills, fever and weight loss.  HENT: Negative for nosebleeds and sore throat.   Eyes: Negative for blurred vision.  Respiratory: Negative for cough, shortness of breath and wheezing.   Cardiovascular: Positive for leg swelling. Negative for chest pain, orthopnea and PND.  Gastrointestinal: Negative for abdominal pain, constipation, diarrhea, heartburn, nausea and vomiting.  Genitourinary: Negative for dysuria and urgency.  Musculoskeletal: Negative for back pain.  Skin: Negative for rash.  Neurological: Negative for dizziness, speech change, focal weakness and headaches.  Endo/Heme/Allergies: Does not bruise/bleed easily.  Psychiatric/Behavioral: Negative for depression.   DRUG ALLERGIES:  No Known Allergies VITALS:  Blood pressure 98/60, pulse (!) 120, temperature 98 F (36.7 C), temperature source Oral, resp. rate 17, height 5\' 9"  (1.753 m), weight 74.9 kg (165 lb 1.6 oz), SpO2 95 %. PHYSICAL EXAMINATION:  Physical Exam  Constitutional: He is oriented to person, place, and time.  HENT:  Head: Normocephalic and atraumatic.  Eyes: Pupils are equal, round, and reactive to light. Conjunctivae and EOM are normal.  Neck: Normal range of motion. Neck supple. No tracheal deviation present. No thyromegaly present.  Cardiovascular: Normal rate, regular rhythm and normal heart sounds.  Pulmonary/Chest: Effort normal and breath sounds normal. No respiratory distress. He has no wheezes. He exhibits no tenderness.  Abdominal: Soft. Bowel sounds are normal. He exhibits no distension. There is no tenderness.  Musculoskeletal:  Normal range of motion. He exhibits edema.  Neurological: He is alert and oriented to person, place, and time. No cranial nerve deficit.  Skin: Skin is warm and dry. No rash noted.   LABORATORY PANEL:  Male CBC Recent Labs  Lab 12/06/17 1005  WBC 16.9*  HGB 9.8*  HCT 31.9*  PLT 282   ------------------------------------------------------------------------------------------------------------------ Chemistries  Recent Labs  Lab 12/03/17 0559  12/05/17 0459 12/05/17 1757 12/06/17 1005  NA 138   < > 139  --   --   K 4.7   < > 3.1* 4.6  --   CL 108   < > 107  --   --   CO2 22   < > 23  --   --   GLUCOSE 99   < > 103*  --   --   BUN 95*   < > 91*  --   --   CREATININE 3.42*   < > 2.82*  --   --   CALCIUM 8.3*   < > 7.9*  --   --   MG  --    < > 2.2 2.9* 2.8*  AST 24  --   --   --   --   ALT 30  --   --   --   --   ALKPHOS 163*  --   --   --   --   BILITOT 2.0*  --   --   --   --    < > = values in this interval not displayed.   RADIOLOGY:  No results found. ASSESSMENT AND PLAN:  84 y.o.malewith a known history of multiple medical problems including CAD,  hypertension, hyperlipidemia, CKD, prostate cancer and a lung nodule.  The patient came to ED due to dizziness and leg swelling for 1 week  * Acute renal failure on CKD stage 3. Creat 3.6->2.82 - off lasix drip - mgmt per Nephro  * AFRVR and multiple runs of VT - converted to NSR with Amiodarone - Switched to PO amiodarone 400 MG po BID - cardio following and recommends eliquis (renal dose) 2.5 mg PO bid  * Hypertension.  Hold torsemide due to low blood pressure.  * CAD.  Continue aspirin and atorvastatin.  * Hyperlipidemia.  Continue atorvastatin.  * Anemia of chronic kidney disease.  Stable.  * LE swelling: likely gouty arthritis, appreciate Rheum, Podiatry and Vascular input.  * acute on chronic systolic & diastolic CHF: On lasix drip per Nephro Monitor I & Os Daily wt -cardio following   At DC -  home with [facility], PT, RN - [facility]     All the records are reviewed and case discussed with Care Management/Social Worker. Management plans discussed with the patient, nursing, family at bedside and they are in agreement.  CODE STATUS: DNR  TOTAL TIME TAKING CARE OF THIS PATIENT: 35 minutes.   More than 50% of the time was spent in counseling/coordination of care: YES  POSSIBLE D/C IN 1-2 DAYS, DEPENDING ON CLINICAL CONDITION.   Max Sane M.D on 12/06/2017 at 1:05 PM  Between 7am to 6pm - Pager - [phone]  After 6pm go to [email or web address] - Proofreader  [facility]  Office  [phone]  CC: Primary care physician; [name], MD  Note: This dictation was prepared with Dragon dictation along with smaller phrase technology. Any transcriptional errors that result from this process are unintentional.

## 2017-12-06 NOTE — Progress Notes (Signed)
[facility] Cardiology  SUBJECTIVE: Patient laying in bed, denies chest pain   Vitals:   12/06/17 0400 12/06/17 0413 12/06/17 0500 12/06/17 0635  BP: 108/62  106/61 98/60  Pulse: 66  66 (!) 120  Resp: (!) 21  17   Temp:    98 F (36.7 C)  TempSrc:    Oral  SpO2: 100%  100% 97%  Weight:  76.3 kg (168 lb 3.4 oz)  74.9 kg (165 lb 1.6 oz)  Height:    5\' 9"  (1.753 m)     Intake/Output Summary (Last 24 hours) at 12/06/2017 0820 Last data filed at 12/06/2017 0700 Gross per 24 hour  Intake 972.38 ml  Output 160 ml  Net 812.38 ml      PHYSICAL EXAM  General: Well developed, well nourished, in no acute distress HEENT:  Normocephalic and atramatic Neck:  No JVD.  Lungs: Clear bilaterally to auscultation and percussion. Heart: HRRR . Normal S1 and S2 without gallops or murmurs.  Abdomen: Bowel sounds are positive, abdomen soft and non-tender  Msk:  Back normal, normal gait. Normal strength and tone for age. Extremities: No clubbing, cyanosis or edema.   Neuro: Alert and oriented X 3. Psych:  Good affect, responds appropriately   LABS: Basic Metabolic Panel: Recent Labs    12/04/17 0443 12/05/17 0459 12/05/17 1757  NA 138 139  --   K 4.2 3.1* 4.6  CL 108 107  --   CO2 22 23  --   GLUCOSE 139* 103*  --   BUN 92* 91*  --   CREATININE 3.21* 2.82*  --   CALCIUM 8.1* 7.9*  --   MG  --  2.2 2.9*  PHOS 4.0  --   --    Liver Function Tests: Recent Labs    12/04/17 0443  ALBUMIN 2.7*   No results for input(s): LIPASE, AMYLASE in the last 72 hours. CBC: Recent Labs    12/04/17 0443 12/05/17 0459  WBC 6.9 6.2  HGB 9.2* 9.2*  HCT 29.4* 29.1*  MCV 71.7* 71.2*  PLT 291 283   Cardiac Enzymes: Recent Labs    12/05/17 0459  TROPONINI 0.06*   BNP: Invalid input(s): POCBNP D-Dimer: No results for input(s): DDIMER in the last 72 hours. Hemoglobin A1C: No results for input(s): HGBA1C in the last 72 hours. Fasting Lipid Panel: No results for input(s): CHOL, HDL, LDLCALC,  TRIG, CHOLHDL, LDLDIRECT in the last 72 hours. Thyroid Function Tests: Recent Labs    12/05/17 0720  TSH 4.967*   Anemia Panel: No results for input(s): VITAMINB12, FOLATE, FERRITIN, TIBC, IRON, RETICCTPCT in the last 72 hours.  No results found.   Echo with EF 45 to 50%  TELEMETRY: Sinus rhythm:  ASSESSMENT AND PLAN:  Active Problems:   Renal failure (ARF), acute on chronic (HCC)   Pressure injury of skin    1.  Atrial fibrillation with rapid ventricular rate, in the setting of acute renal failure and acute on chronic systolic congestive heart failure, converted to sinus rhythm on amiodarone drip 2.  Known CAD, status post DES left circumflex, without chest pain, and borderline elevated troponin, likely demand supply ischemia 3.  Acute on chronic renal failure 4.  Acute on chronic systolic congestive heart failure 5.  Severe mitral regurgitation  Recommendations  1.  Agree with overall current therapy 2.  Transition to oral amiodarone 3.  DC heparin 4.  Start low-dose apixaban in light of the kidney disease 5.  Defer cardiac catheterization in  the absence of chest pain and in light of recent acute on chronic renal failure   Isaias Cowman, MD, PhD, [facility] 12/06/2017 8:20 AM

## 2017-12-06 NOTE — Progress Notes (Signed)
Report called to Kristine Garbe, RN on 2A.  Pt moved to room 253.

## 2017-12-06 NOTE — Progress Notes (Signed)
Pharmacy Electrolyte Monitoring Consult:  Pharmacy consulted to assist in monitoring and replacing electrolytes in this 84 y.o. male admitted on 12/01/2017 with Dizziness; Leg Swelling; and Shortness of Breath    Labs:  Sodium (mmol/L)  Date Value  12/06/2017 137  07/09/2013 139   Potassium (mmol/L)  Date Value  12/06/2017 5.5 (H)  07/09/2013 3.6   Magnesium (mg/dL)  Date Value  12/06/2017 2.8 (H)   Phosphorus (mg/dL)  Date Value  12/06/2017 6.6 (H)   Calcium (mg/dL)  Date Value  12/06/2017 8.6 (L)   Calcium, Total (mg/dL)  Date Value  07/09/2013 8.3 (L)   Albumin (g/dL)  Date Value  12/06/2017 2.7 (L)    Plan: Patient unable to take oral medications. Potassium order changed to potassium 78mEq IV Q1hr x 4.  If patient can tolerate oral medications, will replace electrolytes orally.  Will check electrolytes Q12hr while patient is on furosemide drip.   4/26: Lasix drip discontinued. Labs do not require supplementation. Pharmacy will sign-off for now. Nephrology following.  Chelsee Hosie A 12/06/2017 3:26 PM

## 2017-12-07 LAB — CBC
HCT: 28.1 % — ABNORMAL LOW (ref 40.0–52.0)
HEMOGLOBIN: 8.9 g/dL — AB (ref 13.0–18.0)
MCH: 22.5 pg — AB (ref 26.0–34.0)
MCHC: 31.8 g/dL — AB (ref 32.0–36.0)
MCV: 70.7 fL — AB (ref 80.0–100.0)
Platelets: 246 10*3/uL (ref 150–440)
RBC: 3.98 MIL/uL — ABNORMAL LOW (ref 4.40–5.90)
RDW: 20.7 % — ABNORMAL HIGH (ref 11.5–14.5)
WBC: 10.4 10*3/uL (ref 3.8–10.6)

## 2017-12-07 LAB — BASIC METABOLIC PANEL
Anion gap: 12 (ref 5–15)
BUN: 107 mg/dL — AB (ref 6–20)
CHLORIDE: 106 mmol/L (ref 101–111)
CO2: 18 mmol/L — AB (ref 22–32)
CREATININE: 4.79 mg/dL — AB (ref 0.61–1.24)
Calcium: 8 mg/dL — ABNORMAL LOW (ref 8.9–10.3)
GFR calc Af Amer: 12 mL/min — ABNORMAL LOW (ref >60)
GFR calc non Af Amer: 10 mL/min — ABNORMAL LOW (ref >60)
Glucose, Bld: 106 mg/dL — ABNORMAL HIGH (ref 65–99)
Potassium: 5.7 mmol/L — ABNORMAL HIGH (ref 3.5–5.1)
SODIUM: 136 mmol/L (ref 135–145)

## 2017-12-07 MED ORDER — AMIODARONE HCL 200 MG PO TABS
200.0000 mg | ORAL_TABLET | Freq: Two times a day (BID) | ORAL | Status: DC
  Administered 2017-12-07 – 2017-12-15 (×14): 200 mg via ORAL
  Filled 2017-12-07 (×15): qty 1

## 2017-12-07 MED ORDER — FUROSEMIDE 10 MG/ML IJ SOLN
8.0000 mg/h | INTRAVENOUS | Status: DC
  Administered 2017-12-07: 8 mg/h via INTRAVENOUS
  Filled 2017-12-07: qty 25

## 2017-12-07 MED ORDER — HYDROCODONE-ACETAMINOPHEN 5-325 MG PO TABS
1.0000 | ORAL_TABLET | Freq: Four times a day (QID) | ORAL | Status: DC | PRN
  Administered 2017-12-07 – 2017-12-08 (×3): 1 via ORAL
  Filled 2017-12-07 (×5): qty 1

## 2017-12-07 MED ORDER — ALBUMIN HUMAN 25 % IV SOLN
25.0000 g | Freq: Four times a day (QID) | INTRAVENOUS | Status: AC
  Administered 2017-12-07 – 2017-12-09 (×8): 25 g via INTRAVENOUS
  Filled 2017-12-07 (×9): qty 100

## 2017-12-07 NOTE — Progress Notes (Signed)
RN flushed foley cath. I will continue to assess

## 2017-12-07 NOTE — Progress Notes (Signed)
[facility] Cardiology  SUBJECTIVE: Patient sitting in bed, eating breakfast, denies chest pain   Vitals:   12/06/17 1701 12/06/17 1931 12/07/17 0417 12/07/17 0740  BP: 108/60 94/60 97/60  102/62  Pulse: 70 69 71 68  Resp:  18 (!) 22 18  Temp: 97.7 F (36.5 C) 97.8 F (36.6 C) 98.1 F (36.7 C) 98.3 F (36.8 C)  TempSrc:  Oral Oral Oral  SpO2: 100% 97% 100% 100%  Weight:   75.8 kg (167 lb 1.6 oz)   Height:         Intake/Output Summary (Last 24 hours) at 12/07/2017 1045 Last data filed at 12/06/2017 1829 Gross per 24 hour  Intake 240 ml  Output 0 ml  Net 240 ml      PHYSICAL EXAM  General: Well developed, well nourished, in no acute distress HEENT:  Normocephalic and atramatic Neck:  No JVD.  Lungs: Clear bilaterally to auscultation and percussion. Heart: HRRR . Normal S1 and S2 without gallops or murmurs.  Abdomen: Bowel sounds are positive, abdomen soft and non-tender  Msk:  Back normal, normal gait. Normal strength and tone for age. Extremities: No clubbing, cyanosis or edema.   Neuro: Alert and oriented X 3. Psych:  Good affect, responds appropriately   LABS: Basic Metabolic Panel: Recent Labs    12/05/17 1757 12/06/17 1005 12/07/17 0623  NA  --  137 136  K 4.6 5.5* 5.7*  CL  --  103 106  CO2  --  17* 18*  GLUCOSE  --  78 106*  BUN  --  98* 107*  CREATININE  --  4.28* 4.79*  CALCIUM  --  8.6* 8.0*  MG 2.9* 2.8*  --   PHOS  --  6.6*  --    Liver Function Tests: Recent Labs    12/06/17 1005  ALBUMIN 2.7*   No results for input(s): LIPASE, AMYLASE in the last 72 hours. CBC: Recent Labs    12/06/17 1005 12/07/17 0623  WBC 16.9* 10.4  HGB 9.8* 8.9*  HCT 31.9* 28.1*  MCV 72.4* 70.7*  PLT 282 246   Cardiac Enzymes: Recent Labs    12/05/17 0459  TROPONINI 0.06*   BNP: Invalid input(s): POCBNP D-Dimer: No results for input(s): DDIMER in the last 72 hours. Hemoglobin A1C: No results for input(s): HGBA1C in the last 72 hours. Fasting Lipid  Panel: No results for input(s): CHOL, HDL, LDLCALC, TRIG, CHOLHDL, LDLDIRECT in the last 72 hours. Thyroid Function Tests: Recent Labs    12/06/17 1005  TSH 3.663   Anemia Panel: No results for input(s): VITAMINB12, FOLATE, FERRITIN, TIBC, IRON, RETICCTPCT in the last 72 hours.  No results found.   Echo LVEF 45 to 50%  TELEMETRY: NSR 74 bpm:  ASSESSMENT AND PLAN:  Active Problems:   Renal failure (ARF), acute on chronic (HCC)   Pressure injury of skin    1.  Atrial fibrillation with rapid ventricular rate, converted to sinus rhythm on amiodarone drip, on oral amiodarone for rhythm control 2.  Known CAD, status post DES left circumflex, without chest pain, with borderline elevated troponin, likely demand supply ischemia 3.  Acute on chronic renal failure 4.  Due to on chronic systolic congestive heart failure, stabilized diuresis 5.  Severe mitral regurgitation  Recommendations  1.  Continue current therapy 2.  Continue low-dose apixaban for stroke prevention 3.  Decrease amiodarone to 200 mg twice daily 4.  Defer cardiac catheterization in the absence of chest pain, and ongoing acute on chronic  renal failure  Sign off for now, please call if any questions   Isaias Cowman, MD, PhD, [facility] 12/07/2017 10:45 AM

## 2017-12-07 NOTE — Evaluation (Signed)
Physical Therapy Evaluation Patient Details Name: Andrew [name] MRN: [id number] DOB: 03/01/1933 Today's Date: 12/07/2017   History of Present Illness  84 yo male with onset of renal failure and LE edema with dizziness, SOB, elevated troponin, and concern for stroke was admitted. Has been having VT and acute CHF.   PMHx:  CKD 4, mitral valve disorder, HTN, atherosclerosis, lung nodule, OA   Clinical Impression  Pt is up to walk with assistance but is fatigued and SOB despite O2 sats being acceptable.  Did drop down slightly during gait but is fully recovered and now will continue acutely to monitor his sats and ask for SNF to increase safety with all mobility to return to a multi floor home independently.  Pt has a friend visiting who can vouch for pt being very independent prior to admission.    Follow Up Recommendations SNF    Equipment Recommendations  Rolling walker with 5" wheels    Recommendations for Other Services       Precautions / Restrictions Precautions Precautions: Fall(telemetry) Restrictions Weight Bearing Restrictions: No      Mobility  Bed Mobility               General bed mobility comments: up in the chair when PT arrived  Transfers Overall transfer level: Needs assistance Equipment used: Rolling walker (2 wheeled);1 person hand held assist Transfers: Sit to/from Stand Sit to Stand: Min guard;Min assist            Ambulation/Gait Ambulation/Gait assistance: Min guard Ambulation Distance (Feet): 90 Feet Assistive device: Rolling walker (2 wheeled);1 person hand held assist Gait Pattern/deviations: Step-through pattern;Step-to pattern;Wide base of support;Drifts right/left;Shuffle;Decreased stride length Gait velocity: reduced Gait velocity interpretation: <1.8 ft/sec, indicate of risk for recurrent falls General Gait Details: weak in LE"s and widens his base as he walks and fatigues with two standing rests  Stairs            Wheelchair  Mobility    Modified Rankin (Stroke Patients Only)       Balance Overall balance assessment: Needs assistance Sitting-balance support: Feet supported;Bilateral upper extremity supported Sitting balance-Leahy Scale: Fair     Standing balance support: Bilateral upper extremity supported;During functional activity Standing balance-Leahy Scale: Poor                               Pertinent Vitals/Pain Pain Assessment: No/denies pain    Home Living Family/patient expects to be discharged to:: Private residence Living Arrangements: Alone Available Help at Discharge: Family;Friend(s);Available PRN/intermittently Type of Home: House Home Access: Level entry     Home Layout: Multi-level;Other (Comment)(has all bedrooms upstairs.) Home Equipment: Cane - single point(does not have a lot of equipment as he has not needed)      Prior Function Level of Independence: Independent         Comments: per pt he is still driving     Hand Dominance   Dominant Hand: Right    Extremity/Trunk Assessment   Upper Extremity Assessment Upper Extremity Assessment: Generalized weakness    Lower Extremity Assessment Lower Extremity Assessment: Generalized weakness    Cervical / Trunk Assessment Cervical / Trunk Assessment: Normal  Communication   Communication: HOH(not severe)  Cognition Arousal/Alertness: Awake/alert Behavior During Therapy: WFL for tasks assessed/performed Overall Cognitive Status: Within Functional Limits for tasks assessed  General Comments: pt answers questions slowly but appropriately      General Comments General comments (skin integrity, edema, etc.): pt is wearing an Unna boot on LLE which is WBAT and protected with a slipper sock    Exercises     Assessment/Plan    PT Assessment Patient needs continued PT services  PT Problem List Decreased strength;Decreased range of motion;Decreased  activity tolerance;Decreased balance;Decreased mobility;Decreased coordination;Decreased knowledge of use of DME;Decreased safety awareness;Cardiopulmonary status limiting activity       PT Treatment Interventions DME instruction;Gait training;Stair training;Functional mobility training;Therapeutic activities;Therapeutic exercise;Balance training;Neuromuscular re-education;Patient/family education    PT Goals (Current goals can be found in the Care Plan section)  Acute Rehab PT Goals Patient Stated Goal: to walk and get home soon PT Goal Formulation: With patient Time For Goal Achievement: 12/21/17 Potential to Achieve Goals: Good    Frequency Min 2X/week   Barriers to discharge Inaccessible home environment;Decreased caregiver support multifloor home with no regular roommate    Co-evaluation               AM-PAC PT "6 Clicks" Daily Activity  Outcome Measure Difficulty turning over in bed (including adjusting bedclothes, sheets and blankets)?: Unable Difficulty moving from lying on back to sitting on the side of the bed? : Unable Difficulty sitting down on and standing up from a chair with arms (e.g., wheelchair, bedside commode, etc,.)?: Unable Help needed moving to and from a bed to chair (including a wheelchair)?: A Little Help needed walking in hospital room?: A Little Help needed climbing 3-5 steps with a railing? : A Lot 6 Click Score: 11    End of Session Equipment Utilized During Treatment: Gait belt Activity Tolerance: Patient limited by fatigue;Treatment limited secondary to medical complications (Comment)(pt has a bloody urine collection on his catheter) Patient left: in chair;with call bell/phone within reach;with chair alarm set;with family/visitor present Nurse Communication: Mobility status PT Visit Diagnosis: Unsteadiness on feet (R26.81);Muscle weakness (generalized) (M62.81);Adult, failure to thrive (R62.7)    Time: 7209-4709 PT Time Calculation (min)  (ACUTE ONLY): 38 min   Charges:   PT Evaluation $PT Eval Moderate Complexity: 1 Mod PT Treatments $Gait Training: 8-22 mins $Therapeutic Exercise: 8-22 mins   PT G Codes:   PT G-Codes **NOT FOR INPATIENT CLASS** Functional Assessment Tool Used: AM-PAC 6 Clicks Basic Mobility    Ramond Dial 12/07/2017, 4:52 PM   Mee Hives, PT MS Acute Rehab Dept. Number: [place] [facility] [place]

## 2017-12-07 NOTE — Progress Notes (Signed)
Andrew [name]    MR#:  [id number]  DATE OF BIRTH:  20-Jul-1933  SUBJECTIVE:  CHIEF COMPLAINT:   Chief Complaint  Patient presents with  . Dizziness  . Leg Swelling  . Shortness of Breath  feels better, out of ICU, remains in NSR REVIEW OF SYSTEMS:  Review of Systems  Constitutional: Negative for chills, fever and weight loss.  HENT: Negative for nosebleeds and sore throat.   Eyes: Negative for blurred vision.  Respiratory: Negative for cough, shortness of breath and wheezing.   Cardiovascular: Positive for leg swelling. Negative for chest pain, orthopnea and PND.  Gastrointestinal: Negative for abdominal pain, constipation, diarrhea, heartburn, nausea and vomiting.  Genitourinary: Negative for dysuria and urgency.  Musculoskeletal: Negative for back pain.  Skin: Negative for rash.  Neurological: Negative for dizziness, speech change, focal weakness and headaches.  Endo/Heme/Allergies: Does not bruise/bleed easily.  Psychiatric/Behavioral: Negative for depression.   DRUG ALLERGIES:  No Known Allergies VITALS:  Blood pressure 102/62, pulse 68, temperature 98.3 F (36.8 C), temperature source Oral, resp. rate 18, height 5\' 9"  (1.753 m), weight 75.8 kg (167 lb 1.6 oz), SpO2 100 %. PHYSICAL EXAMINATION:  Physical Exam  Constitutional: He is oriented to person, place, and time.  HENT:  Head: Normocephalic and atraumatic.  Eyes: Pupils are equal, round, and reactive to light. Conjunctivae and EOM are normal.  Neck: Normal range of motion. Neck supple. No tracheal deviation present. No thyromegaly present.  Cardiovascular: Normal rate, regular rhythm and normal heart sounds.  Pulmonary/Chest: Effort normal and breath sounds normal. No respiratory distress. He has no wheezes. He exhibits no tenderness.  Abdominal: Soft. Bowel sounds are normal. He exhibits no distension. There is no tenderness.  Musculoskeletal:  Normal range of motion. He exhibits edema.  Neurological: He is alert and oriented to person, place, and time. No cranial nerve deficit.  Skin: Skin is warm and dry. No rash noted.   LABORATORY PANEL:  Male CBC Recent Labs  Lab 12/07/17 0623  WBC 10.4  HGB 8.9*  HCT 28.1*  PLT 246   ------------------------------------------------------------------------------------------------------------------ Chemistries  Recent Labs  Lab 12/03/17 0559  12/06/17 1005 12/07/17 0623  NA 138   < > 137 136  K 4.7   < > 5.5* 5.7*  CL 108   < > 103 106  CO2 22   < > 17* 18*  GLUCOSE 99   < > 78 106*  BUN 95*   < > 98* 107*  CREATININE 3.42*   < > 4.28* 4.79*  CALCIUM 8.3*   < > 8.6* 8.0*  MG  --    < > 2.8*  --   AST 24  --   --   --   ALT 30  --   --   --   ALKPHOS 163*  --   --   --   BILITOT 2.0*  --   --   --    < > = values in this interval not displayed.   RADIOLOGY:  No results found. ASSESSMENT AND PLAN:  84 y.o.malewith a known history of multiple medical problems including CAD, hypertension, hyperlipidemia, CKD, prostate cancer and a lung nodule.  The patient came to ED due to dizziness and leg swelling for 1 week  * Acute renal failure on CKD stage 3. Creat 3.6->2.82 -restarted Lasix drip - mgmt per Nephro  * AFRVR and multiple runs of VT - converted  to NSR with Amiodarone - Switched to PO amiodarone 400 MG po BID - cardio following and recommends eliquis (renal dose) 2.5 mg PO bid  * Hypertension.  Hold torsemide due to low blood pressure.  * CAD.  Continue aspirin and atorvastatin.  * Hyperlipidemia.  Continue atorvastatin.  * Anemia of chronic kidney disease.  Stable.  * LE swelling: likely gouty arthritis, appreciate Rheum, Podiatry and Vascular input.  * acute on chronic systolic & diastolic CHF: On lasix drip per Nephro Monitor I & Os Daily wt -cardio following   At DC - home with [facility], PT, RN - [facility]     All the records are reviewed and case  discussed with Care Management/Social Worker. Management plans discussed with the patient, nursing, family at bedside and they are in agreement.  CODE STATUS: DNR  TOTAL TIME TAKING CARE OF THIS PATIENT: 35 minutes.   More than 50% of the time was spent in counseling/coordination of care: YES  POSSIBLE D/C IN 1-2 DAYS, DEPENDING ON CLINICAL CONDITION.   [name] M.D on 12/07/2017 at 4:13 PM  Between 7am to 6pm - Pager - [phone]  After 6pm go to [email or web address] - Proofreader  [facility]  Office  [phone]  CC: Primary care physician; [name], MD  Note: This dictation was prepared with Dragon dictation along with smaller phrase technology. Any transcriptional errors that result from this process are unintentional.

## 2017-12-07 NOTE — Progress Notes (Signed)
Patient is in pain  of 5/10 on a pain scale after requested for Tylenol and BP is soft as indicated in flow sheet. Patient has Morphine PRN but the RN is concern for  his low BP. Dr. Duane Boston notified to get a new order for Norco/Vicodin 5-325 mg 1 tab every 6 hours for moderate to severe pain. Will implement and  continue to monitor.

## 2017-12-07 NOTE — Progress Notes (Signed)
[facility]  ROUNDING NOTE   Subjective:   Creatinine 4.79 (4.28)  BUN 107 (98) K 5.7 (5.5) CO 18 (17)  Wbc 10.4 (16.9  1 L [place] O2  Normal sinus rhythm  Objective:  Vital signs in last 24 hours:  Temp:  [97.7 F (36.5 C)-98.3 F (36.8 C)] 98.3 F (36.8 C) (04/27 0740) Pulse Rate:  [68-71] 68 (04/27 0740) Resp:  [18-22] 18 (04/27 0740) BP: (94-108)/(60-62) 102/62 (04/27 0740) SpO2:  [88 %-100 %] 100 % (04/27 0740) Weight:  [75.8 kg (167 lb 1.6 oz)] 75.8 kg (167 lb 1.6 oz) (04/27 0417)  Weight change: -0.504 kg (-1 lb 1.8 oz) Filed Weights   12/06/17 0413 12/06/17 0635 12/07/17 0417  Weight: 76.3 kg (168 lb 3.4 oz) 74.9 kg (165 lb 1.6 oz) 75.8 kg (167 lb 1.6 oz)    Intake/Output: I/O last 3 completed shifts: In: 571.3 [P.O.:240; I.V.:331.3] Out: 160 [Urine:160]   Intake/Output this shift:  No intake/output data recorded.  Physical Exam: General: NAD, laying in bed  Head: Normocephalic, atraumatic. Moist oral mucosal membranes  Eyes: Anicteric, PERRL  Neck: Supple, trachea midline  Lungs:  Clear to auscultation  Heart: Regular rate and rhythm  Abdomen:  Soft, nontender,   Extremities:  + peripheral edema.  Neurologic: Nonfocal, moving all four extremities  Skin: No lesions        Basic Metabolic Panel: Recent Labs  Lab 12/03/17 0559 12/04/17 0443 12/05/17 0459 12/05/17 1757 12/06/17 1005 12/07/17 0623  NA 138 138 139  --  137 136  K 4.7 4.2 3.1* 4.6 5.5* 5.7*  CL 108 108 107  --  103 106  CO2 22 22 23   --  17* 18*  GLUCOSE 99 139* 103*  --  78 106*  BUN 95* 92* 91*  --  98* 107*  CREATININE 3.42* 3.21* 2.82*  --  4.28* 4.79*  CALCIUM 8.3* 8.1* 7.9*  --  8.6* 8.0*  MG  --   --  2.2 2.9* 2.8*  --   PHOS  --  4.0  --   --  6.6*  --     Liver Function Tests: Recent Labs  Lab 12/03/17 0559 12/04/17 0443 12/06/17 1005  AST 24  --   --   ALT 30  --   --   ALKPHOS 163*  --   --   BILITOT 2.0*  --   --   PROT 5.5*  --   --    ALBUMIN 2.8* 2.7* 2.7*   No results for input(s): LIPASE, AMYLASE in the last 168 hours. No results for input(s): AMMONIA in the last 168 hours.  CBC: Recent Labs  Lab 12/03/17 0559 12/04/17 0443 12/05/17 0459 12/06/17 1005 12/07/17 0623  WBC 7.8 6.9 6.2 16.9* 10.4  HGB 9.3* 9.2* 9.2* 9.8* 8.9*  HCT 29.9* 29.4* 29.1* 31.9* 28.1*  MCV 71.5* 71.7* 71.2* 72.4* 70.7*  PLT 307 291 283 282 246    Cardiac Enzymes: Recent Labs  Lab 12/01/17 1004 12/05/17 0459  TROPONINI 0.05* 0.06*    BNP: Invalid input(s): POCBNP  CBG: Recent Labs  Lab 12/05/17 6734  LPFXTK 24    Microbiology: Results for orders placed or performed during the hospital encounter of 12/01/17  MRSA PCR Screening     Status: None   Collection Time: 12/05/17  8:00 AM  Result Value Ref Range Status   MRSA by PCR NEGATIVE NEGATIVE Final    Comment:        The  GeneXpert MRSA Assay (FDA approved for NASAL specimens only), is one component of a comprehensive MRSA colonization surveillance program. It is not intended to diagnose MRSA infection nor to guide or monitor treatment for MRSA infections. Performed at [facility], [address] [place], [place] [address]     Coagulation Studies: Recent Labs    12/05/17 0720  LABPROT 15.0  INR 1.19    Urinalysis: No results for input(s): COLORURINE, LABSPEC, PHURINE, GLUCOSEU, HGBUR, BILIRUBINUR, KETONESUR, PROTEINUR, UROBILINOGEN, NITRITE, LEUKOCYTESUR in the last 72 hours.  Invalid input(s): APPERANCEUR    Imaging: No results found.   Medications:   . albumin human    . furosemide (LASIX) infusion 8 mg/hr (12/07/17 1041)   . amiodarone  200 mg Oral BID  . apixaban  2.5 mg Oral BID  . aspirin  325 mg Oral Daily  . atorvastatin  40 mg Oral Daily  . finasteride  5 mg Oral Daily  . I-VITE  1 tablet Oral Daily  . pantoprazole  40 mg Oral Daily   acetaminophen **OR** acetaminophen, albuterol, bisacodyl, metoprolol tartrate,  morphine injection **OR** morphine injection, ondansetron **OR** ondansetron (ZOFRAN) IV, senna-docusate  Assessment/ Plan:  [name] is a 84 y.o. black male with hypertension, hyperlipidemia, gout, history of prostate cancer, coronary artery disease, who was admitted to [facility] on 12/01/2017    1. Acute renal failure on chronic kidney disease stage III with bland urinalysis. Baseline creatinine of 2.1, GFR of 37 on 10/15/17.  Acute renal failure secondary to acute cardiorenal syndrome - restart IV furosemide gtt - Start IV albumin  - Place foley catheter for strict I/O - Would recommend holding off on cardiac catheterization.   2. Hypertension: with diastolic dysfunction. Atrial fibrillation with RVR. Started on amiodarone and heparin gtt.  Back in sinus rhythm Significant peripheral edema. Home regimen of torsemide.  - furosemide gtt as above.   3. Gout: with hyperuricemia.  - Appreciate rheumatology input - Continue allopurinol.   4. Anemia with chronic kidney disease: microcytic. Iron studies in 12/18 acceptable. hemoglobin 8.9   LOS: 6 [place] 4/27/201910:48 AM

## 2017-12-07 NOTE — Progress Notes (Signed)
MD notified of urine color. [place] notified. Inserted foley per order and urine is maroon in color. MD instructs RN to flush foley times one. I will continue to assess.

## 2017-12-08 LAB — BASIC METABOLIC PANEL
ANION GAP: 15 (ref 5–15)
BUN: 118 mg/dL — AB (ref 6–20)
CHLORIDE: 101 mmol/L (ref 101–111)
CO2: 16 mmol/L — ABNORMAL LOW (ref 22–32)
Calcium: 8 mg/dL — ABNORMAL LOW (ref 8.9–10.3)
Creatinine, Ser: 5.57 mg/dL — ABNORMAL HIGH (ref 0.61–1.24)
GFR calc Af Amer: 10 mL/min — ABNORMAL LOW (ref >60)
GFR, EST NON AFRICAN AMERICAN: 9 mL/min — AB (ref >60)
GLUCOSE: 90 mg/dL (ref 65–99)
POTASSIUM: 6 mmol/L — AB (ref 3.5–5.1)
Sodium: 132 mmol/L — ABNORMAL LOW (ref 135–145)

## 2017-12-08 LAB — ALBUMIN: ALBUMIN: 3.5 g/dL (ref 3.5–5.0)

## 2017-12-08 LAB — PHOSPHORUS: Phosphorus: 6.1 mg/dL — ABNORMAL HIGH (ref 2.5–4.6)

## 2017-12-08 LAB — CBC
HCT: 27.1 % — ABNORMAL LOW (ref 40.0–52.0)
HEMOGLOBIN: 8.7 g/dL — AB (ref 13.0–18.0)
MCH: 22.5 pg — AB (ref 26.0–34.0)
MCHC: 31.9 g/dL — ABNORMAL LOW (ref 32.0–36.0)
MCV: 70.5 fL — AB (ref 80.0–100.0)
PLATELETS: 219 10*3/uL (ref 150–440)
RBC: 3.85 MIL/uL — AB (ref 4.40–5.90)
RDW: 20.6 % — ABNORMAL HIGH (ref 11.5–14.5)
WBC: 10.1 10*3/uL (ref 3.8–10.6)

## 2017-12-08 MED ORDER — SODIUM POLYSTYRENE SULFONATE 15 GM/60ML PO SUSP
30.0000 g | Freq: Once | ORAL | Status: AC
  Administered 2017-12-08: 30 g via ORAL
  Filled 2017-12-08: qty 120

## 2017-12-08 MED ORDER — PATIROMER SORBITEX CALCIUM 8.4 G PO PACK
16.8000 g | PACK | Freq: Every day | ORAL | Status: DC
  Administered 2017-12-08: 16.8 g via ORAL
  Filled 2017-12-08 (×2): qty 4

## 2017-12-08 MED ORDER — NEPRO/CARBSTEADY PO LIQD
237.0000 mL | Freq: Three times a day (TID) | ORAL | Status: DC
  Administered 2017-12-08 – 2017-12-17 (×16): 237 mL via ORAL

## 2017-12-08 NOTE — Progress Notes (Deleted)
Reported increase in troponin to rounding MD and cardiology. No new orders at this time. I will continue to assess.

## 2017-12-08 NOTE — Progress Notes (Signed)
[place] [facility] [place] NAME: Andrew [name]    MR#:  [id number]  DATE OF BIRTH:  1932/08/18  SUBJECTIVE:  Constipation. Her appetite. Family in the room. REVIEW OF SYSTEMS:  Review of Systems  Constitutional: Negative for chills, fever and weight loss.  HENT: Negative for nosebleeds and sore throat.   Eyes: Negative for blurred vision.  Respiratory: Negative for cough, shortness of breath and wheezing.   Cardiovascular: Positive for leg swelling. Negative for chest pain, orthopnea and PND.  Gastrointestinal: Negative for abdominal pain, constipation, diarrhea, heartburn, nausea and vomiting.  Genitourinary: Negative for dysuria and urgency.  Musculoskeletal: Negative for back pain.  Skin: Negative for rash.  Neurological: Negative for dizziness, speech change, focal weakness and headaches.  Endo/Heme/Allergies: Does not bruise/bleed easily.  Psychiatric/Behavioral: Negative for depression.   DRUG ALLERGIES:  No Known Allergies VITALS:  Blood pressure 93/60, pulse (!) 59, temperature (!) 97.5 F (36.4 C), temperature source Oral, resp. rate 17, height 5\' 9"  (1.753 m), weight 77.6 kg (171 lb), SpO2 92 %. PHYSICAL EXAMINATION:  Physical Exam  Constitutional: He is oriented to person, place, and time.  HENT:  Head: Normocephalic and atraumatic.  Eyes: Pupils are equal, round, and reactive to light. Conjunctivae and EOM are normal.  Neck: Normal range of motion. Neck supple. No tracheal deviation present. No thyromegaly present.  Cardiovascular: Normal rate, regular rhythm and normal heart sounds.  Pulmonary/Chest: Effort normal and breath sounds normal. No respiratory distress. He has no wheezes. He exhibits no tenderness.  Abdominal: Soft. Bowel sounds are normal. He exhibits no distension. There is no tenderness.  Musculoskeletal: Normal range of motion. He exhibits edema.  Neurological: He is alert and oriented to person, place, and  time. No cranial nerve deficit.  Skin: Skin is warm and dry. No rash noted.   LABORATORY PANEL:  Male CBC Recent Labs  Lab 12/08/17 0748  WBC 10.1  HGB 8.7*  HCT 27.1*  PLT 219   ------------------------------------------------------------------------------------------------------------------ Chemistries  Recent Labs  Lab 12/03/17 0559  12/06/17 1005  12/08/17 0748  NA 138   < > 137   < > 132*  K 4.7   < > 5.5*   < > 6.0*  CL 108   < > 103   < > 101  CO2 22   < > 17*   < > 16*  GLUCOSE 99   < > 78   < > 90  BUN 95*   < > 98*   < > 118*  CREATININE 3.42*   < > 4.28*   < > 5.57*  CALCIUM 8.3*   < > 8.6*   < > 8.0*  MG  --    < > 2.8*  --   --   AST 24  --   --   --   --   ALT 30  --   --   --   --   ALKPHOS 163*  --   --   --   --   BILITOT 2.0*  --   --   --   --    < > = values in this interval not displayed.   RADIOLOGY:  No results found. ASSESSMENT AND PLAN:  84 y.o.malewith a known history of multiple medical problems including CAD, hypertension, hyperlipidemia, CKD, prostate cancer and a lung nodule.  The patient came to ED due to dizziness and leg swelling for 1 week  * Acute renal  failure on CKD stage 3. Creat 3.6->2.82---4.28-- 4.7-- 5.57 -- DC Lasix drip -continue to monitor urine output and creatinine -if continues to rise creatinine patient may need hemodialysis.  * AF with RVR and multiple runs of VT - converted to NSR with Amiodarone - Switched to PO amiodarone 400 MG po BID - cardio following and recommends eliquis (renal dose) 2.5 mg PO bid  * Hypertension.  Hold torsemide due to low blood pressure.  * CAD.  Continue aspirin and atorvastatin.  * Hyperlipidemia.  Continue atorvastatin.  *Anemia of chronic disease  * LE swelling: likely gouty arthritis, appreciate Rheum, Podiatry and Vascular input.  * acute on chronic systolic & diastolic CHF: On lasix drip per Nephro Monitor I & ODaily wt -cardio following   Spoke at length with  patient's daughter and wife  discussed with nephrology   All the records are reviewed and case discussed with Care Management/Social Worker. Management plans discussed with the patient, nursing, family at bedside and they are in agreement.  CODE STATUS: DNR  TOTAL TIME TAKING CARE OF THIS PATIENT: 25 minutes.   More than 50% of the time was spent in counseling/coordination of care: YES  POSSIBLE D/C IN few DAYS, DEPENDING ON CLINICAL CONDITION.   [name] M.D on 12/08/2017 at 2:08 PM  Between 7am to 6pm - Pager - [phone]  After 6pm go to [email or web address] - Proofreader  [facility]  Office  [phone]  CC: Primary care physician; [name], MD  Note: This dictation was prepared with Dragon dictation along with smaller phrase technology. Any transcriptional errors that result from this process are unintentional.

## 2017-12-08 NOTE — Progress Notes (Signed)
[facility]  ROUNDING NOTE   Subjective:   Family at bedside.  UOP 325 K 6  Objective:  Vital signs in last 24 hours:  Temp:  [97.5 F (36.4 C)-97.7 F (36.5 C)] 97.5 F (36.4 C) (04/28 0507) Pulse Rate:  [59-65] 59 (04/28 0507) Resp:  [17-18] 17 (04/28 0507) BP: (93-102)/(58-62) 93/60 (04/28 0507) SpO2:  [92 %-94 %] 92 % (04/28 0507) Weight:  [77.6 kg (171 lb)] 77.6 kg (171 lb) (04/28 0507)  Weight change: 1.769 kg (3 lb 14.4 oz) Filed Weights   12/06/17 0635 12/07/17 0417 12/08/17 0507  Weight: 74.9 kg (165 lb 1.6 oz) 75.8 kg (167 lb 1.6 oz) 77.6 kg (171 lb)    Intake/Output: I/O last 3 completed shifts: In: 96 [I.V.:96] Out: 325 [Urine:325]   Intake/Output this shift:  No intake/output data recorded.  Physical Exam: General: NAD, laying in bed  Head: Normocephalic, atraumatic. Moist oral mucosal membranes  Eyes: Anicteric, PERRL  Neck: Supple, trachea midline  Lungs:  Clear to auscultation  Heart: Regular rate and rhythm  Abdomen:  Soft, nontender,   Extremities:  + peripheral edema. Right foot in wrappings  Neurologic: Nonfocal, moving all four extremities  Skin: No lesions        Basic Metabolic Panel: Recent Labs  Lab 12/04/17 0443 12/05/17 0459 12/05/17 1757 12/06/17 1005 12/07/17 0623 12/08/17 0748  NA 138 139  --  137 136 132*  K 4.2 3.1* 4.6 5.5* 5.7* 6.0*  CL 108 107  --  103 106 101  CO2 22 23  --  17* 18* 16*  GLUCOSE 139* 103*  --  78 106* 90  BUN 92* 91*  --  98* 107* 118*  CREATININE 3.21* 2.82*  --  4.28* 4.79* 5.57*  CALCIUM 8.1* 7.9*  --  8.6* 8.0* 8.0*  MG  --  2.2 2.9* 2.8*  --   --   PHOS 4.0  --   --  6.6*  --  6.1*    Liver Function Tests: Recent Labs  Lab 12/03/17 0559 12/04/17 0443 12/06/17 1005 12/08/17 0748  AST 24  --   --   --   ALT 30  --   --   --   ALKPHOS 163*  --   --   --   BILITOT 2.0*  --   --   --   PROT 5.5*  --   --   --   ALBUMIN 2.8* 2.7* 2.7* 3.5   No results for input(s):  LIPASE, AMYLASE in the last 168 hours. No results for input(s): AMMONIA in the last 168 hours.  CBC: Recent Labs  Lab 12/04/17 0443 12/05/17 0459 12/06/17 1005 12/07/17 0623 12/08/17 0748  WBC 6.9 6.2 16.9* 10.4 10.1  HGB 9.2* 9.2* 9.8* 8.9* 8.7*  HCT 29.4* 29.1* 31.9* 28.1* 27.1*  MCV 71.7* 71.2* 72.4* 70.7* 70.5*  PLT 291 283 282 246 219    Cardiac Enzymes: Recent Labs  Lab 12/05/17 0459  TROPONINI 0.06*    BNP: Invalid input(s): POCBNP  CBG: Recent Labs  Lab 12/05/17 3664  QIHKVQ 25    Microbiology: Results for orders placed or performed during the hospital encounter of 12/01/17  MRSA PCR Screening     Status: None   Collection Time: 12/05/17  8:00 AM  Result Value Ref Range Status   MRSA by PCR NEGATIVE NEGATIVE Final    Comment:        The GeneXpert MRSA Assay (FDA approved for NASAL specimens  only), is one component of a comprehensive MRSA colonization surveillance program. It is not intended to diagnose MRSA infection nor to guide or monitor treatment for MRSA infections. Performed at [facility], [address] [place], [place] [address]     Coagulation Studies: No results for input(s): LABPROT, INR in the last 72 hours.  Urinalysis: No results for input(s): COLORURINE, LABSPEC, PHURINE, GLUCOSEU, HGBUR, BILIRUBINUR, KETONESUR, PROTEINUR, UROBILINOGEN, NITRITE, LEUKOCYTESUR in the last 72 hours.  Invalid input(s): APPERANCEUR    Imaging: No results found.   Medications:   . albumin human 25 g (12/08/17 0424)   . amiodarone  200 mg Oral BID  . apixaban  2.5 mg Oral BID  . aspirin  325 mg Oral Daily  . atorvastatin  40 mg Oral Daily  . feeding supplement (NEPRO CARB STEADY)  237 mL Oral TID BM  . finasteride  5 mg Oral Daily  . I-VITE  1 tablet Oral Daily  . pantoprazole  40 mg Oral Daily  . patiromer  16.8 g Oral Daily   acetaminophen **OR** acetaminophen, albuterol, bisacodyl, HYDROcodone-acetaminophen, metoprolol  tartrate, morphine injection **OR** morphine injection, ondansetron **OR** ondansetron (ZOFRAN) IV, senna-docusate  Assessment/ Plan:  [name] is a 84 y.o. black male with hypertension, hyperlipidemia, gout, history of prostate cancer, coronary artery disease, who was admitted to [facility] on 12/01/2017    1. Acute renal failure with hyperkalemia, hyponatremia and metabolic acidosis on chronic kidney disease stage III with bland urinalysis. Baseline creatinine of 2.1, GFR of 37 on 10/15/17.  Acute renal failure secondary to acute cardiorenal syndrome, ATN from hypotensive event and then with overdiuresis - Hold fursoemide - Continue IV albumin - Continue  foley catheter for strict I/O - Veltassa ordered. - Would recommend holding off on cardiac catheterization.   2. Hypertension: with diastolic dysfunction. Atrial fibrillation with RVR. Back in sinus rhythm  amiodarone and heparin gtt.  Significant peripheral edema.  - Hold diuretics due to worsening renal failure  3. Gout: with hyperuricemia.  - Appreciate rheumatology input - Continue allopurinol.   4. Anemia with chronic kidney disease: microcytic. Iron studies in 12/18 acceptable. hemoglobin 8.7   LOS: 7 [name] 4/28/201911:39 AM

## 2017-12-08 NOTE — Progress Notes (Signed)
Nutrition Follow-up  DOCUMENTATION CODES:   Not applicable  INTERVENTION:  Provide Nepro Shake po TID, each supplement provides 425 kcal and 19 grams protein.  Continue Magic cup TID with meals, each supplement provides 290 kcal and 9 grams of protein.  Reviewed diet with family members (note to follow). Patient was not appropriate for education today.  Ocuvite daily for wound healing (provides zinc, vitamin A, vitamin C, Vitamin E, copper, and selenium).  NUTRITION DIAGNOSIS:   Increased nutrient needs related to wound healing, chronic illness(CHF, prostate cancer ) as evidenced by estimated needs.  Ongoing.  GOAL:   Patient will meet greater than or equal to 90% of their needs  Progressing.  MONITOR:   PO intake, Supplement acceptance, Labs, Weight trends, I & O's, Skin  REASON FOR ASSESSMENT:   Consult Diet education  ASSESSMENT:   84 y.o. male with a known history of multiple medical problems including CAD, hypertension, hyperlipidemia, CKD, prostate cancer and a lung nodule.  The patient came to ED due to dizziness and leg swelling for 1 week.  He is found acute on chronic renal failure    -RD consulted to provide education on low-sodium diet.  Met with patient and his daughters at bedside. Patient is not really able to provide much history. Daughters report his appetite is poor and he is not eating much at meals. Yesterday for lunch he had some broth. At dinner he had a [employer]. They are not sure if he had any breakfast today. Patient unable to describe his intake PTA. Patient now with worsening renal function, hyperphosphatemia, hyperkalemia, and acute on chronic CHF exacerbation. Patient had previously refused an oral nutrition supplement, but he is now amenable to trying a vanilla supplement.  Medications reviewed and include: amiodarone, Eliquis, pantoprazole, human albumin 25 grams Q6hrs IV.  Labs reviewed: Sodium 132, Potassium 6, CO2 16, BUN 118 (trending  up), Creatinine 5.57 (trending up), Phosphorus 6.1, eGFR 10 (trending down).  Weight trend: 77.6 kg on 4/28; +3.7 kg from admission  NUTRITION - FOCUSED PHYSICAL EXAM:    Most Recent Value  Orbital Region  Mild depletion  Upper Arm Region  Unable to assess  Thoracic and Lumbar Region  No depletion  Buccal Region  No depletion  Temple Region  Moderate depletion  Clavicle Bone Region  Mild depletion  Clavicle and Acromion Bone Region  Mild depletion  Scapular Bone Region  Unable to assess  Dorsal Hand  Unable to assess  Patellar Region  Unable to assess  Anterior Thigh Region  Unable to assess  Posterior Calf Region  Unable to assess  Edema (RD Assessment)  Moderate  Hair  Reviewed  Eyes  Unable to assess  Mouth  Unable to assess  Skin  Reviewed  Nails  Reviewed     Diet Order:  Diet regular Room service appropriate? Yes; Fluid consistency: Thin  EDUCATION NEEDS:   No education needs have been identified at this time  Skin:  Skin Assessment: (stage II to coccyx)  Last BM:  12/05/2017 (BM characteristics not documented)  Height:   Ht Readings from Last 1 Encounters:  12/06/17 '5\' 9"'$  (1.753 m)    Weight:   Wt Readings from Last 1 Encounters:  12/08/17 171 lb (77.6 kg)    Ideal Body Weight:  72.7 kg  BMI:  Body mass index is 25.25 kg/m.  Estimated Nutritional Needs:   Kcal:  1700-2000kcal/day   Protein:  89-104g/day   Fluid:  >1.7L/day or per MD  Andrew Smits, MS, RD, LDN Office: [phone] Pager: [phone] After Hours/Weekend Pager: [phone]  

## 2017-12-08 NOTE — Progress Notes (Signed)
Neph MD notified. Pt does not tolerate valtessa. Pt complains of nausea and dry heaves. Zofran given. MD orders to discontinue valtessa and to give one dose of kayexalate. I will continue to assess.

## 2017-12-08 NOTE — Plan of Care (Signed)
Nutrition Education Note  RD consulted for nutrition education regarding CHF, low sodium diet.  Provided education to patient's family. Patient is not able to actively participate in education today. Patient has very poor PO intake at this time so likely not consuming very much sodium at this time.  RD provided "Heart Failure Nutrition Therapy" handout from the Academy of [employer] and Dietetics. Provided examples on ways to decrease sodium intake in diet. Discouraged intake of processed foods and use of salt shaker. Encouraged fresh fruits and vegetables as well as whole grain sources of carbohydrates to maximize fiber intake.   RD discussed why it is important for patient to adhere to diet recommendations, and emphasized the role of fluids, foods to avoid, and importance of weighing self daily. Teach back method used.  Expect good compliance.  Body mass index is 25.25 kg/m. Pt meets criteria for overweight based on current BMI.  Current diet order is Regular. Patient is being followed by RD due to risk for malnutrition.  Willey Blade, MS, [place], LDN Office: [phone] Pager: [phone] After Hours/Weekend Pager: [phone]

## 2017-12-09 ENCOUNTER — Encounter: Payer: Self-pay | Admitting: Vascular Surgery

## 2017-12-09 ENCOUNTER — Encounter
Admission: EM | Disposition: A | Discharge status: Home Health | Payer: Self-pay | Source: Home / Self Care | Attending: Internal Medicine

## 2017-12-09 DIAGNOSIS — N189 Chronic kidney disease, unspecified: Secondary | ICD-10-CM

## 2017-12-09 HISTORY — PX: DIALYSIS/PERMA CATHETER INSERTION: CATH118288

## 2017-12-09 LAB — CBC
HCT: 25.3 % — ABNORMAL LOW (ref 40.0–52.0)
Hemoglobin: 8.1 g/dL — ABNORMAL LOW (ref 13.0–18.0)
MCH: 22.3 pg — ABNORMAL LOW (ref 26.0–34.0)
MCHC: 31.8 g/dL — ABNORMAL LOW (ref 32.0–36.0)
MCV: 70.2 fL — ABNORMAL LOW (ref 80.0–100.0)
Platelets: 215 10*3/uL (ref 150–440)
RBC: 3.61 MIL/uL — AB (ref 4.40–5.90)
RDW: 20.8 % — AB (ref 11.5–14.5)
WBC: 9.1 10*3/uL (ref 3.8–10.6)

## 2017-12-09 LAB — RENAL FUNCTION PANEL
ALBUMIN: 4.6 g/dL (ref 3.5–5.0)
Anion gap: 19 — ABNORMAL HIGH (ref 5–15)
BUN: 129 mg/dL — AB (ref 6–20)
CO2: 17 mmol/L — ABNORMAL LOW (ref 22–32)
Calcium: 8.3 mg/dL — ABNORMAL LOW (ref 8.9–10.3)
Chloride: 96 mmol/L — ABNORMAL LOW (ref 101–111)
Creatinine, Ser: 6.39 mg/dL — ABNORMAL HIGH (ref 0.61–1.24)
GFR calc Af Amer: 8 mL/min — ABNORMAL LOW (ref >60)
GFR, EST NON AFRICAN AMERICAN: 7 mL/min — AB (ref >60)
Glucose, Bld: 104 mg/dL — ABNORMAL HIGH (ref 65–99)
PHOSPHORUS: 7.1 mg/dL — AB (ref 2.5–4.6)
Potassium: 4.8 mmol/L (ref 3.5–5.1)
Sodium: 132 mmol/L — ABNORMAL LOW (ref 135–145)

## 2017-12-09 SURGERY — DIALYSIS/PERMA CATHETER INSERTION
Anesthesia: LOCAL

## 2017-12-09 MED ORDER — SILVER NITRATE-POT NITRATE 75-25 % EX MISC
1.0000 "application " | CUTANEOUS | Status: DC | PRN
  Filled 2017-12-09: qty 1

## 2017-12-09 MED ORDER — RENA-VITE PO TABS
1.0000 | ORAL_TABLET | Freq: Every day | ORAL | Status: DC
  Administered 2017-12-09 – 2017-12-15 (×5): 1 via ORAL
  Filled 2017-12-09 (×10): qty 1

## 2017-12-09 MED ORDER — SODIUM CHLORIDE 0.9 % IV SOLN: INTRAVENOUS | Status: DC

## 2017-12-09 MED ORDER — HEPARIN (PORCINE) IN NACL 100-0.45 UNIT/ML-% IJ SOLN
1100.0000 [IU]/h | INTRAMUSCULAR | Status: DC
  Administered 2017-12-09: 1100 [IU]/h via INTRAVENOUS
  Filled 2017-12-09: qty 250

## 2017-12-09 SURGICAL SUPPLY — 1 items: KIT DIALYSIS CATH TRI 30X13 (CATHETERS) ×2 IMPLANT

## 2017-12-09 NOTE — Progress Notes (Addendum)
Pt back from HD. VSS

## 2017-12-09 NOTE — Op Note (Signed)
  OPERATIVE NOTE   PROCEDURE: 1. Ultrasound guidance for vascular access right femoral vein 2. Placement of a 30 cm triple lumen dialysis catheter right femoral vein  PRE-OPERATIVE DIAGNOSIS: 1. Acute renal failure 2. CKD  POST-OPERATIVE DIAGNOSIS: Same  SURGEON: Leotis Pain, MD  ASSISTANT(S): None  ANESTHESIA: local  ESTIMATED BLOOD LOSS: Minimal   FINDING(S): 1. None  SPECIMEN(S): None  INDICATIONS:  Patient is a 84 y.o.male who presents with worsening renal failure and now needs dialysis.  Risks and benefits were discussed, and informed consent was obtained..  DESCRIPTION: After obtaining full informed written consent, the patient was laid flat in the bed. The right groin was sterilely prepped and draped in a sterile surgical field was created. The right femoral vein was visualized with ultrasound and found to be widely patent. It was then accessed under direct guidance without difficulty with a Seldinger needle and a permanent image was recorded. A J-wire was then placed. After skin nick and dilatation, a 30 cm triple lumen dialysis catheter was placed over the wire and the wire was removed. The lumens withdrew dark red nonpulsatile blood and flushed easily with sterile saline. The catheter was secured to the skin with 3 nylon sutures. Sterile dressing was placed.  COMPLICATIONS: None  CONDITION: Stable  Leotis Pain 12/09/2017 11:38 AM  This note was created with Dragon Medical transcription system. Any errors in dictation are purely unintentional.

## 2017-12-09 NOTE — Progress Notes (Signed)
ANTICOAGULATION CONSULT NOTE   Pharmacy Consult for heparin Indication: atrial fibrillation  No Known Allergies  Patient Measurements: Height: 5\' 9"  (175.3 cm) Weight: 175 lb 11.3 oz (79.7 kg) IBW/kg (Calculated) : 70.7 Heparin Dosing Weight: 79.7kg  Vital Signs: Temp: 98 F (36.7 C) (04/29 1315) Temp Source: Oral (04/29 1315) BP: 106/62 (04/29 1515) Pulse Rate: 86 (04/29 1515)  Labs: Recent Labs    12/07/17 0623 12/08/17 0748 12/09/17 1038  HGB 8.9* 8.7* 8.1*  HCT 28.1* 27.1* 25.3*  PLT 246 219 215  CREATININE 4.79* 5.57* 6.39*    Estimated Creatinine Clearance: 8.9 mL/min (A) (by C-G formula based on SCr of 6.39 mg/dL (H)).   Medical History: Past Medical History:  Diagnosis Date  . Arteriosclerosis of coronary artery 01/27/2014  . Benign essential HTN 12/11/2013  . Carpal tunnel syndrome   . Coronary atherosclerosis of native coronary artery   . Disorder of mitral valve 12/11/2013  . Encounter for long-term (current) use of antiplatelets/antithrombotics   . Gout   . Hyperlipidemia   . Hypertension   . Lung nodule    found on xray in Aug 2018  . Mitral valve disorder   . Osteoarthritis   . Prostate cancer [facility]    Assessment: 84yo male on HD previously on apixiban 2.5mg  twice daily with the last dose administered at 0947am  Goal of Therapy:  aPTT 66-102 seconds Monitor platelets by anticoagulation protocol: Yes   Plan:  Start heparin drip at 1100 units//hr (14units/kg/hr) beginning at 2200. Draw APTT, CBC and heparin level at 04/30 0400   Dallie Piles 12/09/2017,3:30 PM

## 2017-12-09 NOTE — Care Management Important Message (Signed)
Copy of signed IM left in patient's room.    

## 2017-12-09 NOTE — Progress Notes (Signed)
Post HD assessment    12/09/17 1541  Neurological  Level of Consciousness Alert  Orientation Level Oriented X4  Respiratory  Respiratory Pattern Regular;Unlabored  Chest Assessment Chest expansion symmetrical  Cardiac  ECG Monitor Yes  Vascular  Edema Right lower extremity;Left lower extremity  Integumentary  Integumentary (WDL) X  Skin Color Appropriate for ethnicity  Musculoskeletal  Musculoskeletal (WDL) X  Generalized Weakness Yes  Assistive Device None  GU Assessment  Genitourinary (WDL) X  Genitourinary Symptoms  (HD)  Psychosocial  Psychosocial (WDL) WDL

## 2017-12-09 NOTE — Progress Notes (Signed)
Pt off the floor for HD. Report given to [facility].

## 2017-12-09 NOTE — Progress Notes (Signed)
[facility]  ROUNDING NOTE   Subjective:   Appetite remains poor. Patient had nausea from [facility]. Not sure if he was able to take full dose S Cr, BUN and Potassium are critically elevated UOP remains poor at 110 cc (foley collection) Patient states he had black colored stools  Objective:  Vital signs in last 24 hours:  Temp:  [97.9 F (36.6 C)-98.4 F (36.9 C)] 98.3 F (36.8 C) (04/29 0345) Pulse Rate:  [58-127] 69 (04/29 0916) Resp:  [16-24] 16 (04/29 0916) BP: (99-119)/(60-88) 106/64 (04/29 0916) SpO2:  [98 %-100 %] 99 % (04/29 0916) Weight:  [171 lb 11.2 oz (77.9 kg)] 171 lb 11.2 oz (77.9 kg) (04/29 0345)  Weight change: 11.2 oz (0.318 kg) Filed Weights   12/07/17 0417 12/08/17 0507 12/09/17 0345  Weight: 167 lb 1.6 oz (75.8 kg) 171 lb (77.6 kg) 171 lb 11.2 oz (77.9 kg)    Intake/Output: I/O last 3 completed shifts: In: 396 [I.V.:96; IV Piggyback:300] Out: 236 [Urine:235; Stool:1]   Intake/Output this shift:  No intake/output data recorded.  Physical Exam: General: NAD, laying in bed  Head: Normocephalic, atraumatic. Moist oral mucosal membranes  Eyes: Anicteric,   Neck: Supple, trachea midline  Lungs:  Clear to auscultation  Heart: No rub  Abdomen:  Soft, nontender,   Extremities:  3+ peripheral edema.   Neurologic: Nonfocal, moving all four extremities  Skin: warm        Basic Metabolic Panel: Recent Labs  Lab 12/04/17 0443 12/05/17 0459 12/05/17 1757 12/06/17 1005 12/07/17 0623 12/08/17 0748  NA 138 139  --  137 136 132*  K 4.2 3.1* 4.6 5.5* 5.7* 6.0*  CL 108 107  --  103 106 101  CO2 22 23  --  17* 18* 16*  GLUCOSE 139* 103*  --  78 106* 90  BUN 92* 91*  --  98* 107* 118*  CREATININE 3.21* 2.82*  --  4.28* 4.79* 5.57*  CALCIUM 8.1* 7.9*  --  8.6* 8.0* 8.0*  MG  --  2.2 2.9* 2.8*  --   --   PHOS 4.0  --   --  6.6*  --  6.1*    Liver Function Tests: Recent Labs  Lab 12/03/17 0559 12/04/17 0443 12/06/17 1005  12/08/17 0748  AST 24  --   --   --   ALT 30  --   --   --   ALKPHOS 163*  --   --   --   BILITOT 2.0*  --   --   --   PROT 5.5*  --   --   --   ALBUMIN 2.8* 2.7* 2.7* 3.5   No results for input(s): LIPASE, AMYLASE in the last 168 hours. No results for input(s): AMMONIA in the last 168 hours.  CBC: Recent Labs  Lab 12/04/17 0443 12/05/17 0459 12/06/17 1005 12/07/17 0623 12/08/17 0748  WBC 6.9 6.2 16.9* 10.4 10.1  HGB 9.2* 9.2* 9.8* 8.9* 8.7*  HCT 29.4* 29.1* 31.9* 28.1* 27.1*  MCV 71.7* 71.2* 72.4* 70.7* 70.5*  PLT 291 283 282 246 219    Cardiac Enzymes: Recent Labs  Lab 12/05/17 0459  TROPONINI 0.06*    BNP: Invalid input(s): POCBNP  CBG: Recent Labs  Lab 12/05/17 4163  AGTXMI 68    Microbiology: Results for orders placed or performed during the hospital encounter of 12/01/17  MRSA PCR Screening     Status: None   Collection Time: 12/05/17  8:00 AM  Result  Value Ref Range Status   MRSA by PCR NEGATIVE NEGATIVE Final    Comment:        The GeneXpert MRSA Assay (FDA approved for NASAL specimens only), is one component of a comprehensive MRSA colonization surveillance program. It is not intended to diagnose MRSA infection nor to guide or monitor treatment for MRSA infections. Performed at [facility], [address] [place], [place] [address]     Coagulation Studies: No results for input(s): LABPROT, INR in the last 72 hours.  Urinalysis: No results for input(s): COLORURINE, LABSPEC, PHURINE, GLUCOSEU, HGBUR, BILIRUBINUR, KETONESUR, PROTEINUR, UROBILINOGEN, NITRITE, LEUKOCYTESUR in the last 72 hours.  Invalid input(s): APPERANCEUR    Imaging: No results found.   Medications:   . albumin human 25 g (12/09/17 0421)   . amiodarone  200 mg Oral BID  . apixaban  2.5 mg Oral BID  . aspirin  325 mg Oral Daily  . atorvastatin  40 mg Oral Daily  . feeding supplement (NEPRO CARB STEADY)  237 mL Oral TID BM  . finasteride  5 mg Oral  Daily  . I-VITE  1 tablet Oral Daily  . pantoprazole  40 mg Oral Daily   acetaminophen **OR** acetaminophen, albuterol, bisacodyl, HYDROcodone-acetaminophen, metoprolol tartrate, morphine injection **OR** morphine injection, ondansetron **OR** ondansetron (ZOFRAN) IV, senna-docusate, silver nitrate applicators  Assessment/ Plan:  [name] is a 84 y.o. black male with hypertension, hyperlipidemia, gout, history of prostate cancer, coronary artery disease, who was admitted to [facility] on 12/01/2017    1. Acute renal failure with hyperkalemia, hyponatremia and metabolic acidosis on chronic kidney disease stage III with bland urinalysis. Baseline creatinine of 2.1, GFR of 37 on 10/15/17.  Acute renal failure secondary to acute cardiorenal syndrome, ATN from hypotensive event and then with overdiuresis - Hold fursoemide - Continue  foley catheter for strict I/O - discussed with patient that Kidney function has not improved BUN, Cr and K are critically elevated Patient would benefit from HD He has agreed to proceed consult placed with vascular surgery and Dr Lucky Cowboy notified  2. Hyperkalemia Did not tolerate veltassa - HD today  3. Gout: with hyperuricemia.  - Appreciate rheumatology input - Continue allopurinol.   4. LE edema - Patient has significant edema - volume removal will be difficult with low BP    LOS: 8 Andrew [name] 4/29/20199:45 AM

## 2017-12-09 NOTE — Progress Notes (Signed)
[place] [facility] [place] NAME: Andrew [name]    MR#:  [id number]  DATE OF BIRTH:  [birth year]  SUBJECTIVE:  Constipation. poor appetite. Family in the room. REVIEW OF SYSTEMS:  Review of Systems  Constitutional: Negative for chills, fever and weight loss.  HENT: Negative for nosebleeds and sore throat.   Eyes: Negative for blurred vision.  Respiratory: Negative for cough, shortness of breath and wheezing.   Cardiovascular: Positive for leg swelling. Negative for chest pain, orthopnea and PND.  Gastrointestinal: Negative for abdominal pain, constipation, diarrhea, heartburn, nausea and vomiting.  Genitourinary: Negative for dysuria and urgency.  Musculoskeletal: Negative for back pain.  Skin: Negative for rash.  Neurological: Negative for dizziness, speech change, focal weakness and headaches.  Endo/Heme/Allergies: Does not bruise/bleed easily.  Psychiatric/Behavioral: Negative for depression.   DRUG ALLERGIES:  No Known Allergies VITALS:  Blood pressure (!) 91/56, pulse 85, temperature (!) 97.4 F (36.3 C), temperature source Oral, resp. rate 17, height 5\' 9"  (1.753 m), weight 79.7 kg (175 lb 11.3 oz), SpO2 98 %. PHYSICAL EXAMINATION:  Physical Exam  Constitutional: He is oriented to person, place, and time.  HENT:  Head: Normocephalic and atraumatic.  Eyes: Pupils are equal, round, and reactive to light. Conjunctivae and EOM are normal.  Neck: Normal range of motion. Neck supple. No tracheal deviation present. No thyromegaly present.  Cardiovascular: Normal rate, regular rhythm and normal heart sounds.  Pulmonary/Chest: Effort normal and breath sounds normal. No respiratory distress. He has no wheezes. He exhibits no tenderness.  Abdominal: Soft. Bowel sounds are normal. He exhibits no distension. There is no tenderness.  Musculoskeletal: Normal range of motion. He exhibits edema.  Neurological: He is alert and oriented to person, place,  and time. No cranial nerve deficit.  Skin: Skin is warm and dry. No rash noted.   LABORATORY PANEL:  Male CBC Recent Labs  Lab 12/09/17 1038  WBC 9.1  HGB 8.1*  HCT 25.3*  PLT 215   ------------------------------------------------------------------------------------------------------------------ Chemistries  Recent Labs  Lab 12/03/17 0559  12/06/17 1005  12/09/17 1038  NA 138   < > 137   < > 132*  K 4.7   < > 5.5*   < > 4.8  CL 108   < > 103   < > 96*  CO2 22   < > 17*   < > 17*  GLUCOSE 99   < > 78   < > 104*  BUN 95*   < > 98*   < > 129*  CREATININE 3.42*   < > 4.28*   < > 6.39*  CALCIUM 8.3*   < > 8.6*   < > 8.3*  MG  --    < > 2.8*  --   --   AST 24  --   --   --   --   ALT 30  --   --   --   --   ALKPHOS 163*  --   --   --   --   BILITOT 2.0*  --   --   --   --    < > = values in this interval not displayed.   RADIOLOGY:  No results found. ASSESSMENT AND PLAN:  84 y.o.malewith a known history of multiple medical problems including CAD, hypertension, hyperlipidemia, CKD, prostate cancer and a lung nodule.  The patient came to ED due to dizziness and leg swelling for 1 week  *  Acute renal failure on CKD stage 3. Creat 3.6->2.82---4.28-- 4.7-- 5.57--6.39--HD to be started -vascular consultation appreciated. Patient has a temporary catheter placed in the groin at bedside. -Hemodialysis to be initiated today  * AF with RVR and multiple runs of VT - converted to NSR with Amiodarone - Switched to PO amiodarone 400 MG po BID - cardio following and recommends eliquis (renal dose) 2.5 mg PO bid---now on heparin gtt due to worsening creatinine  * Hypertension.  Hold torsemide due to low blood pressure.  * CAD.  Continue aspirin and atorvastatin.  * Hyperlipidemia.  Continue atorvastatin.  *Anemia of chronic disease  * acute on chronic systolic & diastolic CHF: On lasix drip per Nephro Monitor I & ODaily wt -cardio following  Spoke at length with patient's  daughter   discussed with nephrology   All the records are reviewed and case discussed with Care Management/Social Worker. Management plans discussed with the patient, nursing, family at bedside and they are in agreement.  CODE STATUS: DNR  TOTAL TIME TAKING CARE OF THIS PATIENT: 25 minutes.   More than 50% of the time was spent in counseling/coordination of care: YES  POSSIBLE D/C IN few DAYS, DEPENDING ON CLINICAL CONDITION.   [name] M.D on 12/09/2017 at 3:58 PM  Between 7am to 6pm - Pager - [phone]  After 6pm go to [email or web address] - Proofreader  [facility]  Office  [phone]  CC: Primary care physician; [name], MD  Note: This dictation was prepared with Dragon dictation along with smaller phrase technology. Any transcriptional errors that result from this process are unintentional.

## 2017-12-09 NOTE — Progress Notes (Signed)
Patient with deteriorating renal failure and now needs HD.  We are asked to place a temp cath.  Will do at bedside today.

## 2017-12-09 NOTE — Progress Notes (Addendum)
PT Cancellation Note  Patient Details Name: Andrew [name] MRN: [id number] DOB: Dec 31, 1932   Cancelled Treatment:    Reason Eval/Treat Not Completed: Patient not medically ready;Other (comment). Per chart review, pt last K level (yesterday morning) was 6.0. No evidence found of recheck at this time. Will hold pt until further lab information available to ensure medical safety before attempting PT.   ADDENDUM: Re check for lab values does not show any further K findings; however, having temporary catheter placed (stated bedside) for hemodialysis and is currently out of room.   Larae Grooms, PTA 12/09/2017, 10:24 AM

## 2017-12-09 NOTE — Progress Notes (Signed)
HD tx start   12/09/17 1322  Vital Signs  Pulse Rate 73  Pulse Rate Source Monitor  Resp 12  BP (!) 99/57  BP Location Right Arm  BP Method Automatic  Patient Position (if appropriate) Lying  Oxygen Therapy  SpO2 95 %  O2 Device Room Air  During Hemodialysis Assessment  Blood Flow Rate (mL/min) 200 mL/min  Arterial Pressure (mmHg) -70 mmHg  Venous Pressure (mmHg) 80 mmHg  Transmembrane Pressure (mmHg) 40 mmHg  Ultrafiltration Rate (mL/min) 500 mL/min  Dialysate Flow Rate (mL/min) 300 ml/min  Conductivity: Machine  13.9  HD Safety Checks Performed Yes  Dialysis Fluid Bolus Normal Saline  Bolus Amount (mL) 250 mL  Intra-Hemodialysis Comments Tx initiated

## 2017-12-09 NOTE — Progress Notes (Signed)
Temporary dialysis catheter placed by Dr. Lucky Cowboy. Bleeding on site noted. Applied pressure and bleeding seem to have stopped. Dr. Lucky Cowboy notified, no new orders received.

## 2017-12-09 NOTE — Progress Notes (Signed)
Patient is having a nose bleed unrelieved  after pinching the nose for over 5 minutes. Dr. Jodell Cipro notified with a new order for nasal packing x 1 dose and silver nitrate  applicator PRN. Bleeding is noted on the right nose. Right nare is packed till bleeding stopped. Will continue to monitor.

## 2017-12-09 NOTE — Progress Notes (Signed)
Pre HD assessment    12/09/17 1316  Neurological  Level of Consciousness Responds to Voice  Orientation Level Oriented X4  Respiratory  Respiratory Pattern Regular;Unlabored  Chest Assessment Chest expansion symmetrical  Cardiac  ECG Monitor Yes  Vascular  Edema Right lower extremity;Left lower extremity  Integumentary  Integumentary (WDL) X  Skin Color Appropriate for ethnicity  Musculoskeletal  Musculoskeletal (WDL) X  Generalized Weakness Yes  Assistive Device None  GU Assessment  Genitourinary (WDL) X  Genitourinary Symptoms  (HD)  Psychosocial  Psychosocial (WDL) WDL

## 2017-12-09 NOTE — Progress Notes (Signed)
HD tx end   12/09/17 1532  Vital Signs  Pulse Rate 84  Pulse Rate Source Monitor  Resp 14  BP 107/60  BP Location Right Arm  BP Method Automatic  Patient Position (if appropriate) Lying  Oxygen Therapy  SpO2 100 %  O2 Device Room Air  During Hemodialysis Assessment  Dialysis Fluid Bolus Normal Saline  Bolus Amount (mL) 250 mL  Intra-Hemodialysis Comments Tx completed

## 2017-12-09 NOTE — Progress Notes (Signed)
Pre HD assessment    12/09/17 1315  Vital Signs  Temp 98 F (36.7 C)  Temp Source Oral  Pulse Rate 72  Pulse Rate Source Monitor  Resp 15  BP (!) 92/53  BP Location Right Arm  BP Method Automatic  Patient Position (if appropriate) Lying  Oxygen Therapy  SpO2 99 %  O2 Device Room Air  Pain Assessment  Pain Scale 0-10  Pain Score 0  Dialysis Weight  Weight 79.7 kg (175 lb 11.3 oz)  Type of Weight Pre-Dialysis  Time-Out for Hemodialysis  What Procedure? HD  Pt Identifiers(min of two) First/Last Name;MRN/Account#  Correct Site? Yes  Correct Side? Yes  Correct Procedure? Yes  Consents Verified? Yes  Rad Studies Available? N/A  Safety Precautions Reviewed? Yes  Engineer, civil (consulting) Number  ([id number])  Station Number 2  UF/Alarm Test Passed  Conductivity: Meter 13.8  Conductivity: Machine  13.9  pH 7.4  Reverse Osmosis main  Normal Saline Lot Number [id number]  Dialyzer Lot Number [id number]  Disposable Set Lot Number [id number]  Machine Temperature 98.6 F (37 C)  Musician and Audible Yes  Blood Lines Intact and Secured Yes  Pre Treatment Patient Checks  Vascular access used during treatment Catheter  Hepatitis B Surface Antigen Results  (unk)  Hepatitis B Surface Antibody  (unk)  Date Hepatitis B Surface Antibody Drawn 12/09/17  Hemodialysis Consent Verified Yes  Hemodialysis Standing Orders Initiated Yes  ECG (Telemetry) Monitor On Yes  Prime Ordered Normal Saline  Length of  DialysisTreatment -hour(s) 2 Hour(s)  Dialyzer Elisio 17H NR  Dialysate 2K, 2.5 Ca  Dialysis Anticoagulant None  Dialysate Flow Ordered 300  Blood Flow Rate Ordered 200 mL/min  Ultrafiltration Goal 0.5 Liters  Pre Treatment Labs Hepatitis B Surface Antigen;Renal panel;CBC;Other (Comment) (HBSAG,HBSAB,HBSAG, HBcore, HC)  Dialysis Blood Pressure Support Ordered Normal Saline  Education / Care Plan  Dialysis Education Provided Yes  Documented Education in Care Plan Yes

## 2017-12-09 NOTE — Progress Notes (Signed)
Post HD assessment. Pt tolerated tx well without c/o or complications. Net UF 242, goal not met.    12/09/17 1541  Vital Signs  Temp (!) 97.4 F (36.3 C)  Temp Source Oral  Pulse Rate 85  Pulse Rate Source Monitor  Resp 17  BP (!) 91/56  BP Location Right Arm  BP Method Automatic  Patient Position (if appropriate) Lying  Oxygen Therapy  SpO2 98 %  O2 Device Room Air  Dialysis Weight  Weight 79.7 kg (175 lb 11.3 oz)  Type of Weight Post-Dialysis  Post-Hemodialysis Assessment  Rinseback Volume (mL) 250 mL  KECN 25.3 V  Dialyzer Clearance Lightly streaked  Duration of HD Treatment -hour(s) 2 hour(s)  Hemodialysis Intake (mL) 500 mL  UF Total -Machine (mL) 742 mL  Net UF (mL) 242 mL  Tolerated HD Treatment Yes  Education / Care Plan  Dialysis Education Provided Yes  Documented Education in Care Plan Yes

## 2017-12-10 ENCOUNTER — Encounter: Payer: Self-pay | Admitting: *Deleted

## 2017-12-10 LAB — GLUCOSE, CAPILLARY: GLUCOSE-CAPILLARY: 131 mg/dL — AB (ref 65–99)

## 2017-12-10 LAB — CBC
HCT: 23.4 % — ABNORMAL LOW (ref 40.0–52.0)
Hemoglobin: 7.7 g/dL — ABNORMAL LOW (ref 13.0–18.0)
MCH: 23 pg — ABNORMAL LOW (ref 26.0–34.0)
MCHC: 33 g/dL (ref 32.0–36.0)
MCV: 69.6 fL — ABNORMAL LOW (ref 80.0–100.0)
PLATELETS: 202 10*3/uL (ref 150–440)
RBC: 3.36 MIL/uL — AB (ref 4.40–5.90)
RDW: 20.8 % — ABNORMAL HIGH (ref 11.5–14.5)
WBC: 9.7 10*3/uL (ref 3.8–10.6)

## 2017-12-10 LAB — HEPARIN LEVEL (UNFRACTIONATED)

## 2017-12-10 LAB — APTT

## 2017-12-10 LAB — HEPATITIS B SURFACE ANTIBODY,QUALITATIVE: Hep B S Ab: NONREACTIVE

## 2017-12-10 LAB — HEPATITIS B SURFACE ANTIGEN: Hepatitis B Surface Ag: NEGATIVE

## 2017-12-10 LAB — HEPATITIS C ANTIBODY

## 2017-12-10 LAB — HEPATITIS B CORE ANTIBODY, TOTAL: HEP B C TOTAL AB: NEGATIVE

## 2017-12-10 MED ORDER — DM-GUAIFENESIN ER 30-600 MG PO TB12: 1.0000 | ORAL_TABLET | Freq: Two times a day (BID) | ORAL | Status: DC

## 2017-12-10 MED ORDER — SALINE SPRAY 0.65 % NA SOLN
1.0000 | NASAL | Status: DC | PRN
  Administered 2017-12-10 – 2017-12-12 (×2): 1 via NASAL
  Filled 2017-12-10 (×2): qty 44

## 2017-12-10 MED ORDER — FLUTICASONE PROPIONATE 50 MCG/ACT NA SUSP
2.0000 | Freq: Every day | NASAL | Status: DC
  Administered 2017-12-12 – 2017-12-16 (×4): 2 via NASAL
  Filled 2017-12-10: qty 16

## 2017-12-10 MED ORDER — DEXTROMETHORPHAN POLISTIREX ER 30 MG/5ML PO SUER
30.0000 mg | Freq: Two times a day (BID) | ORAL | Status: DC
Start: 2017-12-10 — End: 2017-12-17
  Administered 2017-12-10 – 2017-12-17 (×11): 30 mg via ORAL
  Filled 2017-12-10 (×19): qty 5

## 2017-12-10 MED ORDER — GUAIFENESIN ER 600 MG PO TB12
600.0000 mg | ORAL_TABLET | Freq: Two times a day (BID) | ORAL | Status: DC
  Administered 2017-12-10 – 2017-12-17 (×12): 600 mg via ORAL
  Filled 2017-12-10 (×14): qty 1

## 2017-12-10 MED ORDER — HEPARIN (PORCINE) IN NACL 100-0.45 UNIT/ML-% IJ SOLN
700.0000 [IU]/h | INTRAMUSCULAR | Status: DC
  Administered 2017-12-10: 700 [IU]/h via INTRAVENOUS

## 2017-12-10 NOTE — Progress Notes (Signed)
Physical Therapy Treatment Patient Details Name: Andrew [name] MRN: [id number] DOB: November 27, 1932 Today's Date: 12/10/2017    History of Present Illness 84 yo male with onset of renal failure and LE edema with dizziness, SOB, elevated troponin, and concern for stroke was admitted. Has been having VT and acute CHF.   PMHx:  CKD 4, mitral valve disorder, HTN, atherosclerosis, lung nodule, OA     PT Comments    Temporary Femoral Cath RLE.  Session limited to primarily LLE ex.  Pt generally fatigued today and tolerated only 1 set of exercises before asking to stop.  Will continue as appropriate.   Follow Up Recommendations  SNF     Equipment Recommendations  Rolling walker with 5" wheels    Recommendations for Other Services       Precautions / Restrictions Precautions Precautions: Fall Precaution Comments: Temporary femoral cath - RLE Restrictions Weight Bearing Restrictions: No    Mobility  Bed Mobility                  Transfers                    Ambulation/Gait                 Stairs             Wheelchair Mobility    Modified Rankin (Stroke Patients Only)       Balance                                            Cognition Arousal/Alertness: Awake/alert Behavior During Therapy: WFL for tasks assessed/performed Overall Cognitive Status: Within Functional Limits for tasks assessed                                        Exercises Other Exercises Other Exercises: BLE ankle pumps and glut sets.  LLE only for SLR, heel slides, ab/add x 10    General Comments        Pertinent Vitals/Pain Pain Assessment: No/denies pain    Home Living                      Prior Function            PT Goals (current goals can now be found in the care plan section) Progress towards PT goals: Progressing toward goals    Frequency    Min 2X/week      PT Plan Current plan remains appropriate     Co-evaluation              AM-PAC PT "6 Clicks" Daily Activity  Outcome Measure  Difficulty turning over in bed (including adjusting bedclothes, sheets and blankets)?: Unable Difficulty moving from lying on back to sitting on the side of the bed? : Unable Difficulty sitting down on and standing up from a chair with arms (e.g., wheelchair, bedside commode, etc,.)?: Unable Help needed moving to and from a bed to chair (including a wheelchair)?: A Little Help needed walking in hospital room?: A Little Help needed climbing 3-5 steps with a railing? : A Lot 6 Click Score: 11    End of Session   Activity Tolerance: Patient tolerated treatment well;Patient limited by fatigue Patient left: in bed;with  bed alarm set;with call bell/phone within reach;with family/visitor present         Time: 1132-1140 PT Time Calculation (min) (ACUTE ONLY): 8 min  Charges:  $Therapeutic Exercise: 8-22 mins                    G Codes:       Chesley Noon, PTA 12/10/17, 12:11 PM

## 2017-12-10 NOTE — Progress Notes (Signed)
[place] for heparin Indication: atrial fibrillation  No Known Allergies  Patient Measurements: Height: 5\' 9"  (175.3 cm) Weight: 171 lb 11.2 oz (77.9 kg) IBW/kg (Calculated) : 70.7 Heparin Dosing Weight: 79.7kg  Vital Signs: Temp: 97.7 F (36.5 C) (04/30 0750) Temp Source: Oral (04/30 0750) BP: 105/66 (04/30 0750) Pulse Rate: 82 (04/30 0750)  Labs: Recent Labs    12/08/17 0748 12/09/17 1038 12/10/17 0534  HGB 8.7* 8.1* 7.7*  HCT 27.1* 25.3* 23.4*  PLT 219 215 202  APTT  --   --  >160*  HEPARINUNFRC  --   --  >3.60*  CREATININE 5.57* 6.39*  --     Estimated Creatinine Clearance: 8.9 mL/min (A) (by C-G formula based on SCr of 6.39 mg/dL (H)).   Medical History: Past Medical History:  Diagnosis Date  . Arteriosclerosis of coronary artery 01/27/2014  . Benign essential HTN 12/11/2013  . Carpal tunnel syndrome   . Coronary atherosclerosis of native coronary artery   . Disorder of mitral valve 12/11/2013  . Encounter for long-term (current) use of antiplatelets/antithrombotics   . Gout   . Hyperlipidemia   . Hypertension   . Lung nodule    found on xray in Aug 2018  . Mitral valve disorder   . Osteoarthritis   . Prostate cancer [facility])    Assessment: 84yo male on HD previously on apixiban 2.5mg  twice daily with the last dose administered at 0947am  Goal of Therapy:  aPTT 66-102 seconds Monitor platelets by anticoagulation protocol: Yes   Plan:  Start heparin drip at 1100 units//hr (14units/kg/hr) beginning at 2200. Draw APTT, CBC and heparin level at 04/30 0400   12/10/17 05:34 aPTT and HL supratherapeutic x 1. Will hold infusion x 1 hour (spoke with RN Doroteo Bradford) then resume at 700 units/hr. Recheck aPTT 6 hours after resuming infusion.  Laural Benes, Pharm.D., BCPS Clinical Pharmacist 12/10/2017,8:03 AM

## 2017-12-10 NOTE — Progress Notes (Signed)
[facility]  ROUNDING NOTE   Subjective:   Tolerated HD yesterday States appetite may be a little better Girlfriend in room   Objective:  Vital signs in last 24 hours:  Temp:  [97.3 F (36.3 C)-98 F (36.7 C)] 97.7 F (36.5 C) (04/30 0750) Pulse Rate:  [71-86] 82 (04/30 0750) Resp:  [12-20] 18 (04/29 1740) BP: (86-107)/(53-66) 105/66 (04/30 0750) SpO2:  [95 %-100 %] 96 % (04/30 0750) Weight:  [171 lb 11.2 oz (77.9 kg)-175 lb 11.3 oz (79.7 kg)] 171 lb 11.2 oz (77.9 kg) (04/30 0452)  Weight change: 4 lb 0.1 oz (1.818 kg) Filed Weights   12/09/17 1315 12/09/17 1541 12/10/17 0452  Weight: 175 lb 11.3 oz (79.7 kg) 175 lb 11.3 oz (79.7 kg) 171 lb 11.2 oz (77.9 kg)    Intake/Output: I/O last 3 completed shifts: In: 144 [I.V.:44; IV Piggyback:100] Out: 443 [Urine:200; XBJYN:829; Stool:1]   Intake/Output this shift:  No intake/output data recorded.  Physical Exam: General: NAD, laying in bed  Head: Normocephalic, atraumatic. Moist oral mucosal membranes  Eyes: Anicteric,   Neck: Supple, trachea midline  Lungs:  Clear to auscultation  Heart: No rub  Abdomen:  Soft, nontender,   Extremities:  3+ peripheral edema.   Neurologic: Nonfocal, moving all four extremities  Skin: warm    Rt femoral temp dialysis cathter    Basic Metabolic Panel: Recent Labs  Lab 12/04/17 0443 12/05/17 0459 12/05/17 1757 12/06/17 1005 12/07/17 0623 12/08/17 0748 12/09/17 1038  NA 138 139  --  137 136 132* 132*  K 4.2 3.1* 4.6 5.5* 5.7* 6.0* 4.8  CL 108 107  --  103 106 101 96*  CO2 22 23  --  17* 18* 16* 17*  GLUCOSE 139* 103*  --  78 106* 90 104*  BUN 92* 91*  --  98* 107* 118* 129*  CREATININE 3.21* 2.82*  --  4.28* 4.79* 5.57* 6.39*  CALCIUM 8.1* 7.9*  --  8.6* 8.0* 8.0* 8.3*  MG  --  2.2 2.9* 2.8*  --   --   --   PHOS 4.0  --   --  6.6*  --  6.1* 7.1*    Liver Function Tests: Recent Labs  Lab 12/04/17 0443 12/06/17 1005 12/08/17 0748 12/09/17 1038  ALBUMIN  2.7* 2.7* 3.5 4.6   No results for input(s): LIPASE, AMYLASE in the last 168 hours. No results for input(s): AMMONIA in the last 168 hours.  CBC: Recent Labs  Lab 12/06/17 1005 12/07/17 0623 12/08/17 0748 12/09/17 1038 12/10/17 0534  WBC 16.9* 10.4 10.1 9.1 9.7  HGB 9.8* 8.9* 8.7* 8.1* 7.7*  HCT 31.9* 28.1* 27.1* 25.3* 23.4*  MCV 72.4* 70.7* 70.5* 70.2* 69.6*  PLT 282 246 219 215 202    Cardiac Enzymes: Recent Labs  Lab 12/05/17 0459  TROPONINI 0.06*    BNP: Invalid input(s): POCBNP  CBG: Recent Labs  Lab 12/05/17 0758 12/10/17 0751  GLUCAP 97 131*    Microbiology: Results for orders placed or performed during the hospital encounter of 12/01/17  MRSA PCR Screening     Status: None   Collection Time: 12/05/17  8:00 AM  Result Value Ref Range Status   MRSA by PCR NEGATIVE NEGATIVE Final    Comment:        The GeneXpert MRSA Assay (FDA approved for NASAL specimens only), is one component of a comprehensive MRSA colonization surveillance program. It is not intended to diagnose MRSA infection nor to guide or monitor  treatment for MRSA infections. Performed at [facility], [address] [place], [place] [address]     Coagulation Studies: No results for input(s): LABPROT, INR in the last 72 hours.  Urinalysis: No results for input(s): COLORURINE, LABSPEC, PHURINE, GLUCOSEU, HGBUR, BILIRUBINUR, KETONESUR, PROTEINUR, UROBILINOGEN, NITRITE, LEUKOCYTESUR in the last 72 hours.  Invalid input(s): APPERANCEUR    Imaging: No results found.   Medications:   . heparin 700 Units/hr (12/10/17 0955)   . amiodarone  200 mg Oral BID  . aspirin  325 mg Oral Daily  . atorvastatin  40 mg Oral Daily  . guaiFENesin  600 mg Oral BID   And  . dextromethorphan  30 mg Oral BID  . feeding supplement (NEPRO CARB STEADY)  237 mL Oral TID BM  . finasteride  5 mg Oral Daily  . I-VITE  1 tablet Oral Daily  . multivitamin  1 tablet Oral QHS  . pantoprazole   40 mg Oral Daily   acetaminophen **OR** acetaminophen, albuterol, bisacodyl, HYDROcodone-acetaminophen, metoprolol tartrate, morphine injection **OR** morphine injection, ondansetron **OR** ondansetron (ZOFRAN) IV, senna-docusate, silver nitrate applicators, sodium chloride  Assessment/ Plan:  Mr. MANDEL SEIDEN is a 84 y.o. black male with hypertension, hyperlipidemia, gout, history of prostate cancer, coronary artery disease, who was admitted to [facility] on 12/01/2017    1. Acute renal failure with hyperkalemia, hyponatremia and metabolic acidosis on chronic kidney disease stage III with bland urinalysis. Baseline creatinine of 2.1, GFR of 37 on 10/15/17.  Acute renal failure secondary to acute cardiorenal syndrome, ATN from hypotensive event and then with overdiuresis - Hold furosemide - Continue  foley catheter for strict I/O -  2nd HD treatment today; 2.5 hr - UF as tolerated  2. Hyperkalemia Corrected with HD  3. Gout: with hyperuricemia.  - Appreciate rheumatology input - Continue allopurinol.   4. LE edema - Patient has significant edema - volume removal will be difficult with low BP    LOS: 9 Caylin Raby 4/30/201910:07 AM

## 2017-12-10 NOTE — Progress Notes (Signed)
Pre HD Tx   12/10/17 1405  Hand-Off documentation  Report given to (Full Name) Beatris Ship, RN   Vital Signs  Temp 98.1 F (36.7 C)  Temp Source Oral  Pulse Rate 78  Pulse Rate Source Monitor  Resp 16  BP 106/64  BP Location Right Arm  BP Method Automatic  Patient Position (if appropriate) Lying  Oxygen Therapy  SpO2 99 %  O2 Device Room Air  Pulse Oximetry Type Continuous  Pain Assessment  Pain Scale 0-10  Pain Score 0  Dialysis Weight  Weight 78 kg (171 lb 15.3 oz)  Type of Weight Pre-Dialysis  Time-Out for Hemodialysis  What Procedure? HD   Pt Identifiers(min of two) First/Last Name;MRN/Account#  Correct Site? Yes  Correct Side? Yes  Correct Procedure? Yes  Consents Verified? Yes  Rad Studies Available? N/A  Safety Precautions Reviewed? Yes  Machine [employer] Number [id number]  Station Number 4  UF/Alarm Test Passed  Conductivity: Meter 13.8  Conductivity: Machine  13.8  pH 7.2  Reverse Osmosis Main  Normal Saline Lot Number [id number]  Dialyzer Lot Number [id number]  Disposable Set Lot Number [id number]  Machine Temperature 98.6 F (37 C)  Musician and Audible Yes  Blood Lines Intact and Secured Yes  Pre Treatment Patient Checks  Vascular access used during treatment Catheter  Hepatitis B Surface Antigen Results Negative  Date Hepatitis B Surface Antigen Drawn 12/09/17  Hepatitis B Surface Antibody 0  Date Hepatitis B Surface Antibody Drawn 12/09/17  Hemodialysis Standing Orders Initiated Yes  ECG (Telemetry) Monitor On Yes  Prime Ordered Normal Saline  Length of  DialysisTreatment -hour(s) 2.5 Hour(s)  Dialysis Treatment Comments Na 140  Dialyzer Elisio 17H NR  Dialysate 2K, 2.5 Ca  Dialysate Flow Ordered 500  Blood Flow Rate Ordered 250 mL/min  Ultrafiltration Goal 0.5 Liters  Dialysis Blood Pressure Support Ordered Normal Saline  Education / Care Plan  Dialysis Education Provided Yes  Documented Education in Care Plan Yes  Hemodialysis  Catheter Right Femoral vein Triple-lumen  Placement Date/Time: 12/09/17 1900   Orientation: Right  Access Location: Femoral vein  Hemodialysis Catheter Type: Triple-lumen  Site Condition No complications  Blue Lumen Status Flushed  Red Lumen Status Flushed  Purple Lumen Status Heparin locked  Dressing Type Gauze/Drain sponge  Dressing Status Clean;Dry;Intact

## 2017-12-10 NOTE — Progress Notes (Signed)
Post HD Tx    12/10/17 1700  Hand-Off documentation  Report given to (Full Name) Stacie Glaze, RN  Report received from (Full Name) Beatris Ship, RN   Vital Signs  Temp 98.1 F (36.7 C)  Temp Source Oral  Pulse Rate 81  Pulse Rate Source Monitor  Resp 17  BP 101/64  BP Location Right Arm  BP Method Automatic  Patient Position (if appropriate) Lying  Pain Assessment  Pain Scale 0-10  Pain Score 0  Dialysis Weight  Weight 77.7 kg (171 lb 4.8 oz)  Type of Weight Post-Dialysis  Post-Hemodialysis Assessment  Rinseback Volume (mL) 500 mL  Dialyzer Clearance Clear  Duration of HD Treatment -hour(s) 2.5 hour(s)  Hemodialysis Intake (mL) 500 mL  UF Total -Machine (mL) 1002 mL  Net UF (mL) 502 mL  Tolerated HD Treatment Yes  Post-Hemodialysis Comments Pt had no complaints   Hemodialysis Catheter Right Femoral vein Triple-lumen  Placement Date/Time: 12/09/17 1900   Orientation: Right  Access Location: Femoral vein  Hemodialysis Catheter Type: Triple-lumen  Site Condition No complications  Catheter fill solution Heparin 1000 units/ml  Post treatment catheter status Capped and Clamped

## 2017-12-10 NOTE — Progress Notes (Signed)
Post HD assessment: no change from initial assessment. Pt had questions about dialysis and the different types, and what happens next. I gave some general information and will follow up with patient liaison, regarding treatment options.    12/10/17 1700  Neurological  Level of Consciousness Alert  Orientation Level Oriented X4  Respiratory  Respiratory Pattern Regular  Chest Assessment Chest expansion symmetrical  Bilateral Breath Sounds Diminished  Cough None  Cardiac  Pulse Regular  Heart Sounds S1, S2  ECG Monitor Yes  Vascular  R Radial Pulse +2  L Radial Pulse +2  Edema Generalized  Psychosocial  Psychosocial (WDL) WDL

## 2017-12-10 NOTE — Plan of Care (Signed)
  Problem: Health Behavior/Discharge Planning: Goal: Ability to manage health-related needs will improve Outcome: Progressing   Problem: Clinical Measurements: Goal: Ability to maintain clinical measurements within normal limits will improve Outcome: Progressing   

## 2017-12-10 NOTE — Progress Notes (Signed)
HD Tx started w/o complication    26/37/85 1415  Vital Signs  Pulse Rate 78  Resp 19  BP 102/62  BP Location Right Arm  BP Method Automatic  Patient Position (if appropriate) Lying  Oxygen Therapy  SpO2 98 %  O2 Device Room Air  Pulse Oximetry Type Continuous  During Hemodialysis Assessment  Blood Flow Rate (mL/min) 250 mL/min  Arterial Pressure (mmHg) -110 mmHg  Venous Pressure (mmHg) 100 mmHg  Transmembrane Pressure (mmHg) 50 mmHg  Ultrafiltration Rate (mL/min) 400 mL/min  Dialysate Flow Rate (mL/min) 500 ml/min  Conductivity: Machine  13.6  HD Safety Checks Performed Yes  Dialysis Fluid Bolus Normal Saline  Bolus Amount (mL) 250 mL  Intra-Hemodialysis Comments Tx initiated (Pt stable w/o complaint )

## 2017-12-10 NOTE — Progress Notes (Signed)
Pre HD Assessment    12/10/17 1400  Neurological  Level of Consciousness Alert  Orientation Level Oriented X4  Respiratory  Respiratory Pattern Regular  Chest Assessment Chest expansion symmetrical  Bilateral Breath Sounds Diminished  Cough None  Cardiac  Pulse Regular  Heart Sounds S1, S2  ECG Monitor Yes  Vascular  R Radial Pulse +2  L Radial Pulse +2  Edema Generalized  Psychosocial  Psychosocial (WDL) WDL

## 2017-12-10 NOTE — Progress Notes (Signed)
[facility] NAME: Deshon Hsiao    MR#:  [id number]  DATE OF BIRTH:  02-27-1933  SUBJECTIVE:  Constipation. poor appetite. Family in the room. REVIEW OF SYSTEMS:  Review of Systems  Constitutional: Negative for chills, fever and weight loss.  HENT: Negative for nosebleeds and sore throat.   Eyes: Negative for blurred vision.  Respiratory: Negative for cough, shortness of breath and wheezing.   Cardiovascular: Positive for leg swelling. Negative for chest pain, orthopnea and PND.  Gastrointestinal: Negative for abdominal pain, constipation, diarrhea, heartburn, nausea and vomiting.  Genitourinary: Negative for dysuria and urgency.  Musculoskeletal: Negative for back pain.  Skin: Negative for rash.  Neurological: Negative for dizziness, speech change, focal weakness and headaches.  Endo/Heme/Allergies: Does not bruise/bleed easily.  Psychiatric/Behavioral: Negative for depression.   DRUG ALLERGIES:  No Known Allergies VITALS:  Blood pressure 105/66, pulse 82, temperature 97.7 F (36.5 C), temperature source Oral, resp. rate 18, height 5\' 9"  (1.753 m), weight 77.9 kg (171 lb 11.2 oz), SpO2 96 %. PHYSICAL EXAMINATION:  Physical Exam  Constitutional: He is oriented to person, place, and time.  HENT:  Head: Normocephalic and atraumatic.  Eyes: Pupils are equal, round, and reactive to light. Conjunctivae and EOM are normal.  Neck: Normal range of motion. Neck supple. No tracheal deviation present. No thyromegaly present.  Cardiovascular: Normal rate, regular rhythm and normal heart sounds.  Pulmonary/Chest: Effort normal and breath sounds normal. No respiratory distress. He has no wheezes. He exhibits no tenderness.  Abdominal: Soft. Bowel sounds are normal. He exhibits no distension. There is no tenderness.  Musculoskeletal: Normal range of motion. He exhibits edema.  Neurological: He is alert and oriented to person, place, and  time. No cranial nerve deficit.  Skin: Skin is warm and dry. No rash noted.   LABORATORY PANEL:  Male CBC Recent Labs  Lab 12/10/17 0534  WBC 9.7  HGB 7.7*  HCT 23.4*  PLT 202   ------------------------------------------------------------------------------------------------------------------ Chemistries  Recent Labs  Lab 12/06/17 1005  12/09/17 1038  NA 137   < > 132*  K 5.5*   < > 4.8  CL 103   < > 96*  CO2 17*   < > 17*  GLUCOSE 78   < > 104*  BUN 98*   < > 129*  CREATININE 4.28*   < > 6.39*  CALCIUM 8.6*   < > 8.3*  MG 2.8*  --   --    < > = values in this interval not displayed.   RADIOLOGY:  No results found. ASSESSMENT AND PLAN:  84 y.o.malewith a known history of multiple medical problems including CAD, hypertension, hyperlipidemia, CKD, prostate cancer and a lung nodule.  The patient came to ED due to dizziness and leg swelling for 1 week  * Acute renal failure on CKD stage 3. Creat 3.6->2.82---4.28-- 4.7-- 5.57--6.39--HD  Started on 12/09/2017 -vascular consultation appreciated. Patient has a temporary catheter placed in the groin at bedside. -Hemodialysis to be initiated April 29th  * AF with RVR and multiple runs of VT---pt is now in NSR - converted to NSR with Amiodarone - Switched to PO amiodarone 400 MG po BID - cardio following and recommends eliquis (renal dose) 2.5 mg PO bid--- on heparin gtt due to worsening creatinine. -pt in NSR -hgb drifting down hold anticoagulation since patient is having melodic stools and hemoglobin is drifting down. This was discussed with Dr. Candiss Norse and Paraschos  *  Hypertension.  - Hold torsemide due to low blood pressure.  * CAD.   -Continue aspirin and atorvastatin.  * Hyperlipidemia. - Continue atorvastatin.  *Anemia of chronic disease -hgb 9.8-- 8.9-- 8.7-- 7.7  * acute on chronic systolic & diastolic CHF: Appears stable now on HD  PT to be started--has temporary groin HD cath  All the records are  reviewed and case discussed with Care Management/Social Worker. Management plans discussed with the patient, nursing, family at bedside and they are in agreement.  CODE STATUS: DNR  TOTAL TIME TAKING CARE OF THIS PATIENT: 25 minutes.   More than 50% of the time was spent in counseling/coordination of care: YES  POSSIBLE D/C IN few DAYS, DEPENDING ON CLINICAL CONDITION.   [name] M.D on 12/10/2017 at 11:37 AM  Between 7am to 6pm - Pager - [phone]  After 6pm go to [email or web address] - Proofreader  [facility]  Office  [phone]  CC: Primary care physician; [name], MD  Note: This dictation was prepared with Dragon dictation along with smaller phrase technology. Any transcriptional errors that result from this process are unintentional.

## 2017-12-10 NOTE — Progress Notes (Signed)
HD Tx ended    12/10/17 1645  Vital Signs  Pulse Rate 81  Pulse Rate Source Monitor  Resp 17  BP 101/65  BP Location Right Arm  BP Method Automatic  Patient Position (if appropriate) Lying  During Hemodialysis Assessment  HD Safety Checks Performed Yes  Dialysis Fluid Bolus Normal Saline  Bolus Amount (mL) 250 mL  Intra-Hemodialysis Comments Tx completed;Tolerated well

## 2017-12-11 LAB — RENAL FUNCTION PANEL
ANION GAP: 12 (ref 5–15)
Albumin: 3.8 g/dL (ref 3.5–5.0)
BUN: 78 mg/dL — ABNORMAL HIGH (ref 6–20)
CALCIUM: 8.3 mg/dL — AB (ref 8.9–10.3)
CHLORIDE: 96 mmol/L — AB (ref 101–111)
CO2: 26 mmol/L (ref 22–32)
Creatinine, Ser: 4.97 mg/dL — ABNORMAL HIGH (ref 0.61–1.24)
GFR calc non Af Amer: 10 mL/min — ABNORMAL LOW (ref >60)
GFR, EST AFRICAN AMERICAN: 11 mL/min — AB (ref >60)
Glucose, Bld: 109 mg/dL — ABNORMAL HIGH (ref 65–99)
Phosphorus: 4.6 mg/dL (ref 2.5–4.6)
Potassium: 3.4 mmol/L — ABNORMAL LOW (ref 3.5–5.1)
SODIUM: 134 mmol/L — AB (ref 135–145)

## 2017-12-11 MED ORDER — ALBUMIN HUMAN 25 % IV SOLN
25.0000 g | Freq: Once | INTRAVENOUS | Status: AC
  Administered 2017-12-11: 25 g via INTRAVENOUS
  Filled 2017-12-11 (×2): qty 100

## 2017-12-11 MED ORDER — MIDODRINE HCL 5 MG PO TABS
5.0000 mg | ORAL_TABLET | Freq: Three times a day (TID) | ORAL | Status: DC
  Administered 2017-12-11 – 2017-12-17 (×17): 5 mg via ORAL
  Filled 2017-12-11 (×17): qty 1

## 2017-12-11 MED ORDER — MIDODRINE HCL 5 MG PO TABS: 5.0000 mg | ORAL_TABLET | Freq: Three times a day (TID) | ORAL | Status: DC

## 2017-12-11 NOTE — Progress Notes (Signed)
PT Cancellation Note  Patient Details Name: Andrew [name] MRN: [id number] DOB: 11/20/1932   Cancelled Treatment:    Reason Eval/Treat Not Completed: Patient at procedure or test/unavailable.  Pt currently off unit at dialysis.  Will re-attempt PT treatment session at a later date/time.  Leitha Bleak, PT 12/11/17, 11:01 AM [phone]

## 2017-12-11 NOTE — Clinical Social Work Note (Signed)
Clinical Social Work Assessment  Patient Details  Name: Andrew [name] MRN: [id number] Date of Birth: 12/03/1932  Date of referral:  12/11/17               Reason for consult:  Facility Placement                Permission sought to share information with:  Family Supports, Customer service manager Permission granted to share information::  Yes, Verbal Permission Granted  Name::     Curlene Dolphin Other [phone] or Renstrom,Verselle Spouse [phone] or Lavell, Supple Daughter   [phone] or Ruppe,Penny Daughter [phone]   Agency::  SNF admissions  Relationship::     Contact Information:     Housing/Transportation Living arrangements for the past 2 months:  Single Family Home Source of Information:  Patient Patient Interpreter Needed:  None Criminal Activity/Legal Involvement Pertinent to Current Situation/Hospitalization:  No - Comment as needed Significant Relationships:  Adult Children, Significant Other Lives with:  Significant Other Do you feel safe going back to the place where you live?  No Need for family participation in patient care:  Yes (Comment)  Care giving concerns:  Patient and family are thinking about going to SNF for short term rehab before returning back home.   Social Worker assessment / plan:  Patient is an 84 year old male who is alert and oriented x4.  Patient lives with his girlfriend, and would like to return back home instead of going to SNF.  CSW spoke with patient about going to SNF for short term rehab and he asked CSW to speak with his daughter Kennyth Lose to discuss SNF vs Rehab.  CSW explained how insurance will pay for stay and also the process for looking for placement.  CSW explained what to expect at SNF and what would be involved in discharging from hospital and SNF.  Patient's daughter stated she would like to look at SNF before making a decision.  Patient and his daughter gave CSW permission to begin bed search in [place].  Patient's  daughter did not express any other questions or concerns.  Employment status:  Retired Nurse, adult PT Recommendations:  [place] / Referral to community resources:  [place]  Patient/Family's Response to care:  Patient and family are not sure if they want patient to go to SNF, they will discuss it among themselves and then let CSW know.  Patient/Family's Understanding of and Emotional Response to Diagnosis, Current Treatment, and Prognosis:  Patient states he would prefer to go home instead of going to SNF.  CSW explained the benefits of going to SNF verse going home with home health, and they will discuss and decide.  Emotional Assessment Appearance:  Appears stated age Attitude/Demeanor/Rapport:    Affect (typically observed):  Appropriate, Pleasant, Stable Orientation:  Oriented to Self, Oriented to Place, Oriented to  Time, Oriented to Situation Alcohol / Substance use:  Not Applicable Psych involvement (Current and /or in the community):  No (Comment)  Discharge Needs  Concerns to be addressed:  Lack of Support, Care Coordination Readmission within the last 30 days:  No Current discharge risk:  Lack of support system Barriers to Discharge:  Continued Medical Work up   Ross Ludwig, [place] 12/11/2017, 5:11 PM

## 2017-12-11 NOTE — Progress Notes (Signed)
   12/11/17 1230  Vital Signs  Temp 97.7 F (36.5 C)  Temp Source Oral  Pulse Rate 79  Pulse Rate Source Monitor  Resp (!) 27  BP 99/64  BP Location Right Arm  BP Method Automatic  Patient Position (if appropriate) Lying  Oxygen Therapy  SpO2 100 %  Pain Assessment  Pain Scale 0-10  Pain Score 0  Dialysis Weight  Weight 77.5 kg (170 lb 12.8 oz)  Time-Out for Hemodialysis  What Procedure? hemodialysis  Post-Hemodialysis Assessment  Rinseback Volume (mL) 250 mL  Dialyzer Clearance Lightly streaked  Duration of HD Treatment -hour(s) 3 hour(s)  Hemodialysis Intake (mL) 600 mL  UF Total -Machine (mL) 333 mL  Net UF (mL) -267 mL  Tolerated HD Treatment No (Comment) (Pt. had arrhythmias; bath changed to 4k; decreased arrhythmi)  Post-Hemodialysis Comments Report given to [place], RN  Education / Care Plan  Dialysis Education Provided Yes  Hemodialysis Catheter Right Femoral vein Triple-lumen  Placement Date/Time: 12/09/17 1900   Orientation: Right  Access Location: Femoral vein  Hemodialysis Catheter Type: Triple-lumen  Site Condition No complications  Blue Lumen Status Heparin locked  Red Lumen Status Heparin locked  Catheter fill solution Heparin 1000 units/ml  Catheter fill volume (Arterial) 2 cc  Catheter fill volume (Venous) 2  Dressing Type Gauze/Drain sponge;Occlusive  Dressing Status Clean;Dry;Intact  Post treatment catheter status Capped and Clamped

## 2017-12-11 NOTE — NC FL2 (Signed)
[place] LEVEL OF CARE SCREENING TOOL     IDENTIFICATION  Patient Name: Andrew [name] Birthdate: [birth year] Sex: male Admission Date (Current Location): 12/01/2017  [place] and [place] Number:  Engineering geologist and Address:  [facility], [address] [place], [place] [address]      Provider Number: [id number]  Attending Physician Name and Address:  [name], MD  Relative Name and Phone Number:  Lucinda Dell [phone] or Ronda,Verselle Spouse [phone]  or Demari, [place] Daughter   [phone] or Dallyn, [place] Daughter   [phone]     Current Level of Care: Hospital Recommended Level of Care: [place] Prior Approval Number:    Date Approved/Denied:   PASRR Number: [id number]  Discharge Plan: SNF    Current Diagnoses: Patient Active Problem List   Diagnosis Date Noted  . Pressure injury of skin 12/02/2017  . Renal failure (ARF), acute on chronic (HCC) 12/01/2017  . Unstable angina ([place]) 10/31/2017  . Elevated troponin 10/30/2017  . Chronic systolic CHF (congestive heart failure) ([place]) 10/30/2017  . Acute posthemorrhagic anemia   . Acute gastric ulcer without hemorrhage or perforation   . GERD (gastroesophageal reflux disease)   . GI bleed 07/26/2017  . Acute renal failure superimposed on chronic kidney disease ([place]) 04/08/2017  . PAD (peripheral artery disease) ([place]) 06/04/2016  . Colon polyp 06/29/2015  . Malignant neoplasm of prostate ([place]) 11/14/2014  . HLD (hyperlipidemia) 07/29/2014  . Long term current use of antithrombotics/antiplatelets 01/27/2014  . Arteriosclerosis of coronary artery 01/27/2014  . Carpal tunnel syndrome 12/11/2013  . Benign essential HTN 12/11/2013  . Lesion of ulnar nerve 12/11/2013  . Disorder of mitral valve 12/11/2013  . Arthritis, degenerative 12/11/2013  . Hereditary and idiopathic neuropathy 12/11/2013    Orientation RESPIRATION BLADDER Height &  Weight     Self, Time, Situation, Place  Normal Continent Weight: 170 lb 12.8 oz (77.5 kg) Height:  5\' 9"  (175.3 cm)  BEHAVIORAL SYMPTOMS/MOOD NEUROLOGICAL BOWEL NUTRITION STATUS      Continent Diet(Fluid restriction renal diet)  AMBULATORY STATUS COMMUNICATION OF NEEDS Skin   Limited Assist Verbally PU Stage and Appropriate Care   PU Stage 2 Dressing: (PRN)                   Personal Care Assistance Level of Assistance  Bathing, Feeding, Dressing Bathing Assistance: Limited assistance Feeding assistance: Independent Dressing Assistance: Limited assistance     Functional Limitations Info  Sight, Hearing, Speech Sight Info: Adequate Hearing Info: Adequate Speech Info: Adequate    SPECIAL CARE FACTORS FREQUENCY  PT (By licensed PT)     PT Frequency: 5x a week              Contractures Contractures Info: Not present    Additional Factors Info  Code Status, Allergies Code Status Info: DNR Allergies Info: NKA           Current Medications (12/11/2017):  This is the current hospital active medication list Current Facility-Administered Medications  Medication Dose Route Frequency Provider Last Rate Last Dose  . acetaminophen (TYLENOL) tablet 650 mg  650 mg Oral Q6H PRN Demetrios Loll, MD   650 mg at 12/10/17 3810   Or  . acetaminophen (TYLENOL) suppository 650 mg  650 mg Rectal Q6H PRN Demetrios Loll, MD      . albumin human 25 % solution 25 g  25 g Intravenous Once Murlean Iba, MD      .  albuterol (PROVENTIL) (2.5 MG/3ML) 0.083% nebulizer solution 2.5 mg  2.5 mg Nebulization Q2H PRN Demetrios Loll, MD      . amiodarone (PACERONE) tablet 200 mg  200 mg Oral BID Isaias Cowman, MD   200 mg at 12/10/17 2151  . aspirin tablet 325 mg  325 mg Oral Daily Demetrios Loll, MD   325 mg at 12/10/17 1733  . atorvastatin (LIPITOR) tablet 40 mg  40 mg Oral Daily Demetrios Loll, MD   40 mg at 12/10/17 1731  . bisacodyl (DULCOLAX) EC tablet 5 mg  5 mg Oral Daily PRN Demetrios Loll, MD   5 mg at  12/10/17 0951  . guaiFENesin (MUCINEX) 12 hr tablet 600 mg  600 mg Oral BID Arta Silence, MD   600 mg at 12/10/17 2151   And  . dextromethorphan (DELSYM) 30 MG/5ML liquid 30 mg  30 mg Oral BID Arta Silence, MD   30 mg at 12/10/17 2151  . feeding supplement (NEPRO CARB STEADY) liquid 237 mL  237 mL Oral TID BM [name], MD   237 mL at 12/10/17 1733  . finasteride (PROSCAR) tablet 5 mg  5 mg Oral Daily Demetrios Loll, MD   5 mg at 12/10/17 1733  . fluticasone (FLONASE) 50 MCG/ACT nasal spray 2 spray  2 spray Each Nare Daily [name], MD      . HYDROcodone-acetaminophen (NORCO/VICODIN) 5-325 MG per tablet 1 tablet  1 tablet Oral Q6H PRN Amelia Jo, MD   1 tablet at 12/08/17 1116  . I-VITE TABS 1 tablet  1 tablet Oral Daily Max Sane, MD   1 tablet at 12/10/17 1732  . metoprolol tartrate (LOPRESSOR) injection 2.5 mg  2.5 mg Intravenous Q3H PRN Wilhelmina Mcardle, MD      . midodrine (PROAMATINE) tablet 5 mg  5 mg Oral TID WC Murlean Iba, MD   5 mg at 12/11/17 0848  . multivitamin (RENA-VIT) tablet 1 tablet  1 tablet Oral QHS [name], MD   1 tablet at 12/10/17 2151  . ondansetron (ZOFRAN) tablet 4 mg  4 mg Oral Q6H PRN Demetrios Loll, MD       Or  . ondansetron [facility]) injection 4 mg  4 mg Intravenous Q6H PRN Demetrios Loll, MD   4 mg at 12/08/17 1443  . pantoprazole (PROTONIX) EC tablet 40 mg  40 mg Oral Daily Demetrios Loll, MD   40 mg at 12/10/17 1732  . senna-docusate (Senokot-S) tablet 1 tablet  1 tablet Oral QHS PRN Demetrios Loll, MD   1 tablet at 12/10/17 1732  . silver nitrate applicators applicator 1 application  1 application Topical PRN Arta Silence, MD      . sodium chloride (OCEAN) 0.65 % nasal spray 1 spray  1 spray Each Nare PRN Arta Silence, MD   1 spray at 12/10/17 1313     Discharge Medications: Please see discharge summary for a list of discharge medications.  Relevant Imaging Results:  Relevant Lab Results:   Additional Information SSN  [id number]  Ross Ludwig, [place]

## 2017-12-11 NOTE — Progress Notes (Signed)
[facility]  ROUNDING NOTE   Subjective:   Tolerated HD yesterday States appetite may be a little better  Patient seen during dialysis Had episodes of tachycardia. BMP = low K; then K bath was changed to 4K   HEMODIALYSIS FLOWSHEET:  Blood Flow Rate (mL/min): 300 mL/min Arterial Pressure (mmHg): -130 mmHg Venous Pressure (mmHg): 120 mmHg Transmembrane Pressure (mmHg): 60 mmHg Ultrafiltration Rate (mL/min): 0 mL/min Dialysate Flow Rate (mL/min): 600 ml/min Conductivity: Machine : 14.1 Conductivity: Machine : 14.1 Dialysis Fluid Bolus: Normal Saline Bolus Amount (mL): 250 mL Dialysate Change: 4K  UOP remains poor Continues to have large amount of LE edema   Objective:  Vital signs in last 24 hours:  Temp:  [97.7 F (36.5 C)-98.4 F (36.9 C)] 97.7 F (36.5 C) (05/01 1230) Pulse Rate:  [47-81] 74 (05/01 1400) Resp:  [14-27] 27 (05/01 1230) BP: (97-109)/(58-68) 101/67 (05/01 1400) SpO2:  [96 %-100 %] 96 % (05/01 1400) Weight:  [169 lb 12.8 oz (77 kg)-171 lb 4.8 oz (77.7 kg)] 170 lb 12.8 oz (77.5 kg) (05/01 1230)  Weight change: -3 lb 12 oz (-1.7 kg) Filed Weights   12/11/17 0431 12/11/17 0925 12/11/17 1230  Weight: 169 lb 12.8 oz (77 kg) 170 lb 12.8 oz (77.5 kg) 170 lb 12.8 oz (77.5 kg)    Intake/Output: I/O last 3 completed shifts: In: 10 [I.V.:44] Out: 702 [Urine:200; Other:502]   Intake/Output this shift:  Total I/O In: -  Out: -267   Physical Exam: General: NAD, laying in bed  Head: Normocephalic, atraumatic. Moist oral mucosal membranes  Eyes: Anicteric,   Neck: Supple, trachea midline  Lungs:  Clear to auscultation  Heart: No rub  Abdomen:  Soft, nontender,   Extremities:  3+ peripheral edema.   Neurologic: Nonfocal, moving all four extremities  Skin: warm    Rt femoral temp dialysis cathter    Basic Metabolic Panel: Recent Labs  Lab 12/05/17 0459 12/05/17 1757 12/06/17 1005 12/07/17 0623 12/08/17 0748 12/09/17 1038  12/11/17 0822  NA 139  --  137 136 132* 132* 134*  K 3.1* 4.6 5.5* 5.7* 6.0* 4.8 3.4*  CL 107  --  103 106 101 96* 96*  CO2 23  --  17* 18* 16* 17* 26  GLUCOSE 103*  --  78 106* 90 104* 109*  BUN 91*  --  98* 107* 118* 129* 78*  CREATININE 2.82*  --  4.28* 4.79* 5.57* 6.39* 4.97*  CALCIUM 7.9*  --  8.6* 8.0* 8.0* 8.3* 8.3*  MG 2.2 2.9* 2.8*  --   --   --   --   PHOS  --   --  6.6*  --  6.1* 7.1* 4.6    Liver Function Tests: Recent Labs  Lab 12/06/17 1005 12/08/17 0748 12/09/17 1038 12/11/17 0822  ALBUMIN 2.7* 3.5 4.6 3.8   No results for input(s): LIPASE, AMYLASE in the last 168 hours. No results for input(s): AMMONIA in the last 168 hours.  CBC: Recent Labs  Lab 12/06/17 1005 12/07/17 0623 12/08/17 0748 12/09/17 1038 12/10/17 0534  WBC 16.9* 10.4 10.1 9.1 9.7  HGB 9.8* 8.9* 8.7* 8.1* 7.7*  HCT 31.9* 28.1* 27.1* 25.3* 23.4*  MCV 72.4* 70.7* 70.5* 70.2* 69.6*  PLT 282 246 219 215 202    Cardiac Enzymes: Recent Labs  Lab 12/05/17 0459  TROPONINI 0.06*    BNP: Invalid input(s): POCBNP  CBG: Recent Labs  Lab 12/05/17 0758 12/10/17 0751  GLUCAP 97 131*    Microbiology:  Results for orders placed or performed during the hospital encounter of 12/01/17  MRSA PCR Screening     Status: None   Collection Time: 12/05/17  8:00 AM  Result Value Ref Range Status   MRSA by PCR NEGATIVE NEGATIVE Final    Comment:        The GeneXpert MRSA Assay (FDA approved for NASAL specimens only), is one component of a comprehensive MRSA colonization surveillance program. It is not intended to diagnose MRSA infection nor to guide or monitor treatment for MRSA infections. Performed at [facility], [address] [place], [place] [address]     Coagulation Studies: No results for input(s): LABPROT, INR in the last 72 hours.  Urinalysis: No results for input(s): COLORURINE, LABSPEC, PHURINE, GLUCOSEU, HGBUR, BILIRUBINUR, KETONESUR, PROTEINUR, UROBILINOGEN,  NITRITE, LEUKOCYTESUR in the last 72 hours.  Invalid input(s): APPERANCEUR    Imaging: No results found.   Medications:    . amiodarone  200 mg Oral BID  . aspirin  325 mg Oral Daily  . atorvastatin  40 mg Oral Daily  . guaiFENesin  600 mg Oral BID   And  . dextromethorphan  30 mg Oral BID  . feeding supplement (NEPRO CARB STEADY)  237 mL Oral TID BM  . finasteride  5 mg Oral Daily  . fluticasone  2 spray Each Nare Daily  . I-VITE  1 tablet Oral Daily  . midodrine  5 mg Oral TID WC  . multivitamin  1 tablet Oral QHS  . pantoprazole  40 mg Oral Daily   acetaminophen **OR** acetaminophen, albuterol, bisacodyl, HYDROcodone-acetaminophen, metoprolol tartrate, ondansetron **OR** ondansetron (ZOFRAN) IV, senna-docusate, silver nitrate applicators, sodium chloride  Assessment/ Plan:  [name] is a 84 y.o. black male with hypertension, hyperlipidemia, gout, history of prostate cancer, coronary artery disease, who was admitted to [facility] on 12/01/2017    1. Acute renal failure with hyperkalemia, hyponatremia and metabolic acidosis on chronic kidney disease stage III with bland urinalysis. Baseline creatinine of 2.1, GFR of 37 on 10/15/17.  Acute renal failure secondary to acute cardiorenal syndrome, ATN from hypotensive event and then with overdiuresis - Hold furosemide - Continue  foley catheter for strict I/O -  3rd HD treatment today;   - UF as tolerated  arrythmias during HD , likely low K; Dialysis bath changed to 4 k Monitor UOP May hold HD on thursday  2. Hyperkalemia Corrected with HD, now Low K  3. Gout: with hyperuricemia.  - Appreciate rheumatology input - Continue allopurinol.   4. LE edema - Patient has significant edema - volume removal will be difficult with low BP    LOS: 10 Andrew [name] 5/1/20192:28 PM

## 2017-12-11 NOTE — Care Management Important Message (Signed)
Copy of signed IM left in patient's room.    

## 2017-12-11 NOTE — Progress Notes (Signed)
[facility] [place] NAME: Andrew [name]    MR#:  [id number]  DATE OF BIRTH:  10/04/1932  SUBJECTIVE:  Constipation. poor appetite. Family in the room. REVIEW OF SYSTEMS:  Review of Systems  Constitutional: Negative for chills, fever and weight loss.  HENT: Negative for nosebleeds and sore throat.   Eyes: Negative for blurred vision.  Respiratory: Negative for cough, shortness of breath and wheezing.   Cardiovascular: Positive for leg swelling. Negative for chest pain, orthopnea and PND.  Gastrointestinal: Negative for abdominal pain, constipation, diarrhea, heartburn, nausea and vomiting.  Genitourinary: Negative for dysuria and urgency.  Musculoskeletal: Negative for back pain.  Skin: Negative for rash.  Neurological: Negative for dizziness, speech change, focal weakness and headaches.  Endo/Heme/Allergies: Does not bruise/bleed easily.  Psychiatric/Behavioral: Negative for depression.   DRUG ALLERGIES:  No Known Allergies VITALS:  Blood pressure 101/67, pulse 74, temperature 97.7 F (36.5 C), temperature source Oral, resp. rate (!) 27, height 5\' 9"  (1.753 m), weight 77.5 kg (170 lb 12.8 oz), SpO2 96 %. PHYSICAL EXAMINATION:  Physical Exam  Constitutional: He is oriented to person, place, and time.  HENT:  Head: Normocephalic and atraumatic.  Eyes: Pupils are equal, round, and reactive to light. Conjunctivae and EOM are normal.  Neck: Normal range of motion. Neck supple. No tracheal deviation present. No thyromegaly present.  Cardiovascular: Normal rate, regular rhythm and normal heart sounds.  Pulmonary/Chest: Effort normal and breath sounds normal. No respiratory distress. He has no wheezes. He exhibits no tenderness.  Abdominal: Soft. Bowel sounds are normal. He exhibits no distension. There is no tenderness.  Musculoskeletal: Normal range of motion. He exhibits edema.  Neurological: He is alert and oriented to person, place,  and time. No cranial nerve deficit.  Skin: Skin is warm and dry. No rash noted.   LABORATORY PANEL:  Male CBC Recent Labs  Lab 12/10/17 0534  WBC 9.7  HGB 7.7*  HCT 23.4*  PLT 202   ------------------------------------------------------------------------------------------------------------------ Chemistries  Recent Labs  Lab 12/06/17 1005  12/11/17 0822  NA 137   < > 134*  K 5.5*   < > 3.4*  CL 103   < > 96*  CO2 17*   < > 26  GLUCOSE 78   < > 109*  BUN 98*   < > 78*  CREATININE 4.28*   < > 4.97*  CALCIUM 8.6*   < > 8.3*  MG 2.8*  --   --    < > = values in this interval not displayed.   RADIOLOGY:  No results found. ASSESSMENT AND PLAN:  84 y.o.malewith a known history of multiple medical problems including CAD, hypertension, hyperlipidemia, CKD, prostate cancer and a lung nodule.  The patient came to ED due to dizziness and leg swelling for 1 week  * Acute renal failure on CKD stage 3.  -Creat 3.6->2.82---4.28-- 4.7-- 5.57--6.39--HD-- 4.97 -started making some urine - Started on 12/09/2017 -vascular consultation appreciated. Patient has a temporary catheter placed in the groin at bedside. -Hemodialysis  initiated April 29th  * AF with RVR and multiple runs of VT---pt is now in NSR - converted to NSR with Amiodarone - Switched to PO amiodarone 400 MG po BID - cardio following and recommends eliquis (renal dose) 2.5 mg PO bid--- on heparin gtt due to worsening creatinine. -pt in NSR -hgb drifting down hold anticoagulation since patient is having melodic stools and hemoglobin is drifting down. This was discussed with  Dr. Candiss Norse and Paraschos  * Hypertension.  - Hold torsemide due to low blood pressure.  * CAD.   -Continue aspirin and atorvastatin.  * Hyperlipidemia. - Continue atorvastatin.  *Anemia of chronic disease -hgb 9.8-- 8.9-- 8.7-- 7.7  * acute on chronic systolic & diastolic CHF: Appears stable now on HD  PT to be started--has temporary  groin HD cath  All the records are reviewed and case discussed with Care Management/Social Worker. Management plans discussed with the patient, nursing, family at bedside and they are in agreement.  CODE STATUS: DNR  TOTAL TIME TAKING CARE OF THIS PATIENT: 25 minutes.   More than 50% of the time was spent in counseling/coordination of care: YES  POSSIBLE D/C IN few DAYS, DEPENDING ON CLINICAL CONDITION.   [name] M.D on 12/11/2017 at 3:26 PM  Between 7am to 6pm - Pager - [phone]  After 6pm go to [email or web address] - Proofreader  [facility]  Office  [phone]  CC: Primary care physician; [name], MD  Note: This dictation was prepared with Dragon dictation along with smaller phrase technology. Any transcriptional errors that result from this process are unintentional.

## 2017-12-11 NOTE — Progress Notes (Addendum)
CCMD notified this nurse that patient had a 20 bt run of [place]. Patient is currently down in Dialysis. Notified Health and safety inspector in HD and verbalized that patient was on a bedside monitor and he will monitor him.

## 2017-12-11 NOTE — Clinical Social Work Note (Signed)
CSW spoke with patient regarding SNF placement options.  Patient was given bed offers, and then asked to have CSW contact his daughter Kennyth Lose [phone].  CSW contacted patient's daughter and she is going to look at bed offers and discuss with her sisters.  Patient's daughter stated that they may consider home health instead but will discuss it with her siblings.  CSW to continue to follow patient's progress throughout discharge planning.  Jones Broom. [place], MSW, [place]  12/11/2017 5:04 PM

## 2017-12-12 ENCOUNTER — Ambulatory Visit: Payer: Medicare Other | Admitting: Family

## 2017-12-12 MED ORDER — OCUVITE-LUTEIN PO CAPS
1.0000 | ORAL_CAPSULE | Freq: Every day | ORAL | Status: DC
  Administered 2017-12-13 – 2017-12-17 (×5): 1 via ORAL
  Filled 2017-12-12 (×6): qty 1

## 2017-12-12 MED ORDER — PHENAZOPYRIDINE HCL 100 MG PO TABS
100.0000 mg | ORAL_TABLET | Freq: Two times a day (BID) | ORAL | Status: DC | PRN
  Administered 2017-12-12 – 2017-12-13 (×2): 100 mg via ORAL
  Filled 2017-12-12 (×2): qty 1

## 2017-12-12 NOTE — Progress Notes (Signed)
[facility]  ROUNDING NOTE   Subjective:   Feels fair today.  Appetite is still poor.  2 of his daughters are in the room with him.  Continues to have lower extremity edema. Urine output remains poor at 100 cc 500 cc removed with hemodialysis yesterday Patient also states that Foley catheter is irritating his bladder and he feels sensation to void  Objective:  Vital signs in last 24 hours:  Temp:  [97.6 F (36.4 C)-98.2 F (36.8 C)] 97.6 F (36.4 C) (05/02 0812) Pulse Rate:  [67-77] 72 (05/02 0812) Resp:  [17-19] 17 (05/02 0812) BP: (90-109)/(51-68) 109/64 (05/02 0812) SpO2:  [94 %-100 %] 96 % (05/02 0812) Weight:  [171 lb (77.6 kg)] 171 lb (77.6 kg) (05/02 0345)  Weight change: -1 lb 2.5 oz (-0.526 kg) Filed Weights   12/11/17 0925 12/11/17 1230 12/12/17 0345  Weight: 170 lb 12.8 oz (77.5 kg) 170 lb 12.8 oz (77.5 kg) 171 lb (77.6 kg)    Intake/Output: I/O last 3 completed shifts: In: -  Out: -167 [Urine:100]   Intake/Output this shift:  No intake/output data recorded.  Physical Exam: General: NAD, laying in bed  Head: Normocephalic, atraumatic. Moist oral mucosal membranes  Eyes: Anicteric,   Neck: Supple, trachea midline  Lungs:  Clear to auscultation  Heart: No rub  Abdomen:  Soft, nontender,   Extremities:  3+ peripheral edema. Left foot wrap, toes congested  Neurologic: Nonfocal, moving all four extremities  Skin: warm    Rt femoral temp dialysis cathter    Basic Metabolic Panel: Recent Labs  Lab 12/05/17 1757  12/06/17 1005 12/07/17 0623 12/08/17 0748 12/09/17 1038 12/11/17 0822  NA  --   --  137 136 132* 132* 134*  K 4.6  --  5.5* 5.7* 6.0* 4.8 3.4*  CL  --   --  103 106 101 96* 96*  CO2  --   --  17* 18* 16* 17* 26  GLUCOSE  --   --  78 106* 90 104* 109*  BUN  --   --  98* 107* 118* 129* 78*  CREATININE  --   --  4.28* 4.79* 5.57* 6.39* 4.97*  CALCIUM  --    < > 8.6* 8.0* 8.0* 8.3* 8.3*  MG 2.9*  --  2.8*  --   --   --   --    PHOS  --   --  6.6*  --  6.1* 7.1* 4.6   < > = values in this interval not displayed.    Liver Function Tests: Recent Labs  Lab 12/06/17 1005 12/08/17 0748 12/09/17 1038 12/11/17 0822  ALBUMIN 2.7* 3.5 4.6 3.8   No results for input(s): LIPASE, AMYLASE in the last 168 hours. No results for input(s): AMMONIA in the last 168 hours.  CBC: Recent Labs  Lab 12/06/17 1005 12/07/17 0623 12/08/17 0748 12/09/17 1038 12/10/17 0534  WBC 16.9* 10.4 10.1 9.1 9.7  HGB 9.8* 8.9* 8.7* 8.1* 7.7*  HCT 31.9* 28.1* 27.1* 25.3* 23.4*  MCV 72.4* 70.7* 70.5* 70.2* 69.6*  PLT 282 246 219 215 202    Cardiac Enzymes: No results for input(s): CKTOTAL, CKMB, CKMBINDEX, TROPONINI in the last 168 hours.  BNP: Invalid input(s): POCBNP  CBG: Recent Labs  Lab 12/10/17 0751  GLUCAP 131*    Microbiology: Results for orders placed or performed during the hospital encounter of 12/01/17  MRSA PCR Screening     Status: None   Collection Time: 12/05/17  8:00 AM  Result Value Ref Range Status   MRSA by PCR NEGATIVE NEGATIVE Final    Comment:        The GeneXpert MRSA Assay (FDA approved for NASAL specimens only), is one component of a comprehensive MRSA colonization surveillance program. It is not intended to diagnose MRSA infection nor to guide or monitor treatment for MRSA infections. Performed at [facility], [address] [place], [place] [address]     Coagulation Studies: No results for input(s): LABPROT, INR in the last 72 hours.  Urinalysis: No results for input(s): COLORURINE, LABSPEC, PHURINE, GLUCOSEU, HGBUR, BILIRUBINUR, KETONESUR, PROTEINUR, UROBILINOGEN, NITRITE, LEUKOCYTESUR in the last 72 hours.  Invalid input(s): APPERANCEUR    Imaging: No results found.   Medications:    . amiodarone  200 mg Oral BID  . aspirin  325 mg Oral Daily  . atorvastatin  40 mg Oral Daily  . guaiFENesin  600 mg Oral BID   And  . dextromethorphan  30 mg Oral BID  .  feeding supplement (NEPRO CARB STEADY)  237 mL Oral TID BM  . finasteride  5 mg Oral Daily  . fluticasone  2 spray Each Nare Daily  . midodrine  5 mg Oral TID WC  . multivitamin  1 tablet Oral QHS  . multivitamin-lutein  1 capsule Oral Daily  . pantoprazole  40 mg Oral Daily   acetaminophen **OR** acetaminophen, albuterol, bisacodyl, HYDROcodone-acetaminophen, metoprolol tartrate, ondansetron **OR** ondansetron (ZOFRAN) IV, phenazopyridine, senna-docusate, silver nitrate applicators, sodium chloride  Assessment/ Plan:  Mr. LEOPOLDO MAZZIE is a 84 y.o. black male with hypertension, hyperlipidemia, gout, ? history of prostate cancer, coronary artery disease, who was admitted to [facility] on 12/01/2017    1. Acute renal failure with hyperkalemia, hyponatremia and metabolic acidosis on chronic kidney disease stage III with bland urinalysis. Baseline creatinine of 2.1, GFR of 37 on 10/15/17.  Acute renal failure secondary to acute cardiorenal syndrome, ATN from hypotensive event and then with overdiuresis  arrythmias during HD , likely low K; Dialysis bath changed to 4 k Monitor UOP; may remove foley May hold HD on Thursday Start d/c planning for outpatient HD (as ARF) If Renal function does not recover, he will need permcath early next week  2. Hyperkalemia Corrected with HD, now Low K  3. Gout: with hyperuricemia.  - Appreciate rheumatology, podiatry input - Continue allopurinol.   4. LE edema - Patient has significant edema - volume removal will be difficult with low BP, A Fib  5.  H/o prostate cancer 2016-2017 Adenocarcinoma- Seen by Dr Erlene Quan in 07/2015- missed f/u since then Treated with Finasteride    LOS: [place] 5/2/20192:39 PM

## 2017-12-12 NOTE — Progress Notes (Signed)
Pt scheduled to get 200mg  PO amiodarone, however pt's BP 90/51, MAP 63, HR 67-NSR. Amiodarone was held earlier today due to low BP. MD paged. Dr. Marcille Blanco gave orders to hold this mediation. Will continue to monitor. Conley Simmonds, RN, BSN

## 2017-12-12 NOTE — Progress Notes (Signed)
[place] [facility] [place] NAME: Andrew [name]    MR#:  [id number]  DATE OF BIRTH:  1932/12/26  SUBJECTIVE:  patient wanting Foley catheter to be removed. I started drinking his nepro. Family in the room. REVIEW OF SYSTEMS:  Review of Systems  Constitutional: Negative for chills, fever and weight loss.  HENT: Negative for nosebleeds and sore throat.   Eyes: Negative for blurred vision.  Respiratory: Negative for cough, shortness of breath and wheezing.   Cardiovascular: Positive for leg swelling. Negative for chest pain, orthopnea and PND.  Gastrointestinal: Negative for abdominal pain, constipation, diarrhea, heartburn, nausea and vomiting.  Genitourinary: Negative for dysuria and urgency.  Musculoskeletal: Negative for back pain.  Skin: Negative for rash.  Neurological: Negative for dizziness, speech change, focal weakness and headaches.  Endo/Heme/Allergies: Does not bruise/bleed easily.  Psychiatric/Behavioral: Negative for depression.   DRUG ALLERGIES:  No Known Allergies VITALS:  Blood pressure 109/64, pulse 72, temperature 97.6 F (36.4 C), temperature source Oral, resp. rate 17, height 5\' 9"  (1.753 m), weight 77.6 kg (171 lb), SpO2 96 %. PHYSICAL EXAMINATION:  Physical Exam  Constitutional: He is oriented to person, place, and time.  HENT:  Head: Normocephalic and atraumatic.  Eyes: Pupils are equal, round, and reactive to light. Conjunctivae and EOM are normal.  Neck: Normal range of motion. Neck supple. No tracheal deviation present. No thyromegaly present.  Cardiovascular: Normal rate, regular rhythm and normal heart sounds.  Pulmonary/Chest: Effort normal and breath sounds normal. No respiratory distress. He has no wheezes. He exhibits no tenderness.  Abdominal: Soft. Bowel sounds are normal. He exhibits no distension. There is no tenderness.  Musculoskeletal: Normal range of motion. He exhibits edema.  Neurological: He is  alert and oriented to person, place, and time. No cranial nerve deficit.  Skin: Skin is warm and dry. No rash noted.   LABORATORY PANEL:  Male CBC Recent Labs  Lab 12/10/17 0534  WBC 9.7  HGB 7.7*  HCT 23.4*  PLT 202   ------------------------------------------------------------------------------------------------------------------ Chemistries  Recent Labs  Lab 12/06/17 1005  12/11/17 0822  NA 137   < > 134*  K 5.5*   < > 3.4*  CL 103   < > 96*  CO2 17*   < > 26  GLUCOSE 78   < > 109*  BUN 98*   < > 78*  CREATININE 4.28*   < > 4.97*  CALCIUM 8.6*   < > 8.3*  MG 2.8*  --   --    < > = values in this interval not displayed.   RADIOLOGY:  No results found. ASSESSMENT AND PLAN:  84 y.o.malewith a known history of multiple medical problems including CAD, hypertension, hyperlipidemia, CKD, prostate cancer and a lung nodule.  The patient came to ED due to dizziness and leg swelling for 1 week  * Acute renal failure on CKD stage 3.  -Creat 3.6->2.82---4.28-- 4.7-- 5.57--6.39--HD-- 4.97 -started making some urine - Started on 12/09/2017 -vascular consultation appreciated. Patient has a temporary catheter placed in the groin at bedside. -Hemodialysis  initiated April 29th -PERM Cath on Monday  * AF with RVR and multiple runs of VT---pt is now in NSR - converted to NSR with Amiodarone - Switched to PO amiodarone 400 MG po BID - cardio following and recommends eliquis (renal dose) 2.5 mg PO bid--- on heparin gtt due to worsening creatinine. -pt in NSR -hgb drifting down hold anticoagulation since patient is having melanotic  stools and hemoglobin is drifting down. This was discussed with Dr. Candiss Norse and Paraschos  * Hypertension.  - Hold torsemide due to low blood pressure.  * CAD.   -Continue aspirin and atorvastatin.  * Hyperlipidemia. - Continue atorvastatin.  *Anemia of chronic disease -hgb 9.8-- 8.9-- 8.7-- 7.7  * acute on chronic systolic & diastolic  CHF: Appears stable now on HD  PT to be started--has temporary groin HD cath I discussed at length with two daughters at length. Updated patient's current medical condition and they had a lot of questions regarding his past medical history since they were not involved in patient's care. Daughters told me there are four sisters and they will be taking turns to stay with patient initially to help him out. They are eager more to take patient home with home health and physical therapy. I have explained that I will start physical therapy and will be more than happy to make arrangements at home depending on how patient is doing and feeling. Patient also wants to go home.  All the records are reviewed and case discussed with Care Management/Social Worker. Management plans discussed with the patient, nursing, family at bedside and they are in agreement.  CODE STATUS: DNR  TOTAL TIME TAKING CARE OF THIS PATIENT: 25 minutes.   More than 50% of the time was spent in counseling/coordination of care: YES  POSSIBLE D/C IN few DAYS, DEPENDING ON CLINICAL CONDITION.   [name] M.D on 12/12/2017 at 12:39 PM  Between 7am to 6pm - Pager - [phone]  After 6pm go to [email or web address] - Proofreader  [facility] [place] [facility]  Office  [phone]  CC: Primary care physician; [name], MD  Note: This dictation was prepared with Dragon dictation along with smaller phrase technology. Any transcriptional errors that result from this process are unintentional.

## 2017-12-12 NOTE — Plan of Care (Signed)
  Problem: Clinical Measurements: Goal: Cardiovascular complication will be avoided Outcome: Progressing   Problem: Pain Managment: Goal: General experience of comfort will improve Outcome: Progressing   Problem: Nutrition: Goal: Adequate nutrition will be maintained Outcome: Not Progressing Pt continues to have poor appetite today

## 2017-12-12 NOTE — Progress Notes (Signed)
Nutrition Follow Up Note   DOCUMENTATION CODES:   Not applicable  INTERVENTION:   Pt would likely need feeding tube placement with nutrition support to meet his estimated needs, encourage wound healing, preserve lean muscle, and support losses from HD; this may not be in line with pt's goals of care.   Nepro Shake po TID, each supplement provides 425 kcal and 19 grams protein  Magic cup TID with meals, each supplement provides 290 kcal and 9 grams of protein  Liberalize diet  Rena-vite daily   Ocuvite daily for wound healing (provides zinc, vitamin A, vitamin C, Vitamin E, copper, and selenium)  NUTRITION DIAGNOSIS:   Increased nutrient needs related to wound healing, chronic illness(CHF, prostate cancer ) as evidenced by increased estimated needs from protein.   GOAL:   Patient will meet greater than or equal to 90% of their needs  -not met  MONITOR:   PO intake, Supplement acceptance, Labs, Weight trends, I & O's, Skin  ASSESSMENT:   84 y.o. male with a known history of multiple medical problems including CAD, hypertension, hyperlipidemia, CKD, prostate cancer and a lung nodule.  The patient came to ED due to dizziness and leg swelling for 1 week.  He is found acute on chronic renal failure    Pt with continued poor appetite and oral intake; eating only bites of meals and drinking some nepro. Pt would likely need feeding tube placement with nutrition support to meet his estimated needs, encourage wound healing, preserve lean muscle and support losses from HD; this may not be in line with pt's goals of care. Per chart, pt with weight gain since admit. Pt s/p perm cath placement and HD initiation 4/29. RD will liberalize diet as pt is not eating enough to surpass potassium and phosphorus restrictions. Continue supplements. RD will monitor for GOC.    Medications reviewed and include: aspirin, protonix, rena-vite, ocuvite   Labs reviewed: Na 134(L), K 3.4(L), Cl 96(L), BUN  78(H), creat 4.97(H), Ca 8.3(L), P 4.6 wnl- 5/1 Hgb 7.7(L), Hct 23.4(L)- 4/30  Diet Order:   Diet Order           Diet renal with fluid restriction Fluid restriction: 1200 mL Fluid; Room service appropriate? Yes; Fluid consistency: Thin  Diet effective now         EDUCATION NEEDS:   No education needs have been identified at this time  Skin:  Skin Assessment: (stage II to coccyx)  Last BM:  5/1  Height:   Ht Readings from Last 1 Encounters:  12/09/17 _0  (1.753 m)    Weight:   Wt Readings from Last 1 Encounters:  12/12/17 171 lb (77.6 kg)    Ideal Body Weight:  72.7 kg  BMI:  Body mass index is 25.25 kg/m.  Estimated Nutritional Needs:   Kcal:  1700-2000kcal/day   Protein:  89-104g/day   Fluid:  >1.7L/day or per MD  Koleen Distance MS, RD, LDN Pager #[phone] After Hours Pager: [phone]

## 2017-12-13 LAB — MAGNESIUM: Magnesium: 2.2 mg/dL (ref 1.7–2.4)

## 2017-12-13 LAB — PSA: Prostatic Specific Antigen: 11.14 ng/mL — ABNORMAL HIGH (ref 0.00–4.00)

## 2017-12-13 NOTE — Progress Notes (Signed)
Pre HD assessment   12/13/17 1338  Vital Signs  Temp 98.1 F (36.7 C)  Temp Source Oral  Pulse Rate 75  Pulse Rate Source Monitor  Resp 13  BP 99/76  BP Location Right Arm  BP Method Automatic  Patient Position (if appropriate) Lying  Oxygen Therapy  SpO2 100 %  O2 Device Room Air  Pain Assessment  Pain Scale 0-10  Pain Score 0  Dialysis Weight  Weight 77.7 kg (171 lb 4.8 oz)  Type of Weight Pre-Dialysis  Time-Out for Hemodialysis  What Procedure? HD  Pt Identifiers(min of two) First/Last Name;MRN/Account#  Correct Site? Yes  Correct Side? Yes  Correct Procedure? Yes  Consents Verified? Yes  Rad Studies Available? N/A  Safety Precautions Reviewed? Yes  Engineer, civil (consulting) Number  ([id number])  Station Number 4  UF/Alarm Test Passed  Conductivity: Meter 13.8  Conductivity: Machine  13.8  pH 7.4  Reverse Osmosis main  Normal Saline Lot Number [id number]  Dialyzer Lot Number [id number]  Disposable Set Lot Number [id number]  Machine Temperature 98.6 F (37 C)  Musician and Audible Yes  Blood Lines Intact and Secured Yes  Pre Treatment Patient Checks  Vascular access used during treatment Catheter  Hepatitis B Surface Antigen Results Negative  Date Hepatitis B Surface Antigen Drawn 12/09/17  Hepatitis B Surface Antibody  (<10)  Date Hepatitis B Surface Antibody Drawn 12/09/17  Hemodialysis Consent Verified Yes  Hemodialysis Standing Orders Initiated Yes  ECG (Telemetry) Monitor On Yes  Prime Ordered Normal Saline  Length of  DialysisTreatment -hour(s) 3 Hour(s)  Dialyzer Elisio 17H NR  Dialysate 4K  Dialysis Anticoagulant None  Dialysate Flow Ordered 600  Blood Flow Rate Ordered 300 mL/min  Ultrafiltration Goal 1.5 Liters  Pre Treatment Labs Other (Comment) (magnesium )  Dialysis Blood Pressure Support Ordered Normal Saline  Education / Care Plan  Dialysis Education Provided Yes  Documented Education in Care Plan Yes  Hemodialysis Catheter Right Femoral  vein Triple-lumen  Placement Date/Time: 12/09/17 1900   Orientation: Right  Access Location: Femoral vein  Hemodialysis Catheter Type: Triple-lumen  Site Condition No complications  Blue Lumen Status Heparin locked  Red Lumen Status Heparin locked  Purple Lumen Status N/A  Dressing Type Biopatch  Dressing Status Clean;Dry;Intact  Drainage Description None  Dressing Change Due 12/19/17

## 2017-12-13 NOTE — Clinical Social Work Note (Signed)
Currently patient's family feel they would rather have him go home with home health.  CSW to continue to follow in case patient's family changes their mind.  Jones Broom. [place], MSW, [place]  12/13/2017 4:25 PM

## 2017-12-13 NOTE — Progress Notes (Signed)
[facility] [place] NAME: Andrew [name]    MR#:  [id number]  DATE OF BIRTH:  12-03-1932  SUBJECTIVE:  more awake alert conversing with. Three daughters in the room. No new complaints. Eating a little better REVIEW OF SYSTEMS:  Review of Systems  Constitutional: Negative for chills, fever and weight loss.  HENT: Negative for nosebleeds and sore throat.   Eyes: Negative for blurred vision.  Respiratory: Negative for cough, shortness of breath and wheezing.   Cardiovascular: Positive for leg swelling. Negative for chest pain, orthopnea and PND.  Gastrointestinal: Negative for abdominal pain, constipation, diarrhea, heartburn, nausea and vomiting.  Genitourinary: Negative for dysuria and urgency.  Musculoskeletal: Negative for back pain.  Skin: Negative for rash.  Neurological: Negative for dizziness, speech change, focal weakness and headaches.  Endo/Heme/Allergies: Does not bruise/bleed easily.  Psychiatric/Behavioral: Negative for depression.   DRUG ALLERGIES:  No Known Allergies VITALS:  Blood pressure 103/60, pulse 72, temperature 98.1 F (36.7 C), temperature source Oral, resp. rate (!) 24, height 5\' 9"  (1.753 m), weight 77.7 kg (171 lb 4.8 oz), SpO2 99 %. PHYSICAL EXAMINATION:  Physical Exam  Constitutional: He is oriented to person, place, and time.  HENT:  Head: Normocephalic and atraumatic.  Eyes: Pupils are equal, round, and reactive to light. Conjunctivae and EOM are normal.  Neck: Normal range of motion. Neck supple. No tracheal deviation present. No thyromegaly present.  Cardiovascular: Normal rate, regular rhythm and normal heart sounds.  Pulmonary/Chest: Effort normal and breath sounds normal. No respiratory distress. He has no wheezes. He exhibits no tenderness.  Abdominal: Soft. Bowel sounds are normal. He exhibits no distension. There is no tenderness.  Musculoskeletal: Normal range of motion. He exhibits edema.    Neurological: He is alert and oriented to person, place, and time. No cranial nerve deficit.  Skin: Skin is warm and dry. No rash noted.   LABORATORY PANEL:  Male CBC Recent Labs  Lab 12/10/17 0534  WBC 9.7  HGB 7.7*  HCT 23.4*  PLT 202   ------------------------------------------------------------------------------------------------------------------ Chemistries  Recent Labs  Lab 12/11/17 0822  NA 134*  K 3.4*  CL 96*  CO2 26  GLUCOSE 109*  BUN 78*  CREATININE 4.97*  CALCIUM 8.3*   RADIOLOGY:  No results found. ASSESSMENT AND PLAN:  84 y.o.malewith a known history of multiple medical problems including CAD, hypertension, hyperlipidemia, CKD, prostate cancer and a lung nodule.  The patient came to ED due to dizziness and leg swelling for 1 week  * Acute renal failure on CKD stage 3.  -Creat 3.6->2.82---4.28-- 4.7-- 5.57--6.39--HD-- 4.97 -started making some urine - Started on 12/09/2017 -vascular consultation appreciated. Patient has a temporary catheter placed in the groin at bedside. -Hemodialysis  initiated April 29th -PERM Cath on Monday  * AF with RVR and multiple runs of VT---pt is now in NSR - converted to NSR with Amiodarone - Switched to PO amiodarone 400 MG po BID - cardio following and recommends eliquis (renal dose) 2.5 mg PO bid--- on heparin gtt due to worsening creatinine. -pt in NSR -hgb drifting down hold anticoagulation since patient is having melanotic  stools and hemoglobin is drifting down. This was discussed with Dr. Candiss Norse and Paraschos  * Hypertension.  - Hold torsemide due to low blood pressure.  * CAD.   -Continue aspirin and atorvastatin.  * Hyperlipidemia. - Continue atorvastatin.  *Anemia of chronic disease -hgb 9.8-- 8.9-- 8.7-- 7.7  * acute on chronic systolic &  diastolic CHF: Appears stable now on HD  PT to be started--has temporary groin HD cath which will be removedd today after HD  I discussed at length with two  daughters at length. Updated patient's current medical condition and they had a lot of questions regarding his past medical history since they were not involved in patient's care. Daughters told me there are four sisters and they will be taking turns to stay with patient initially to help him out. They are eager more to take patient home with home health and physical therapy. I have explained that  will start physical therapy and will be more than happy to make arrangements at home depending on how patient is doing and feeling. Patient also wants to go home.  All the records are reviewed and case discussed with Care Management/Social Worker. Management plans discussed with the patient, nursing, family at bedside and they are in agreement.  CODE STATUS: DNR  TOTAL TIME TAKING CARE OF THIS PATIENT: 25 minutes.   More than 50% of the time was spent in counseling/coordination of care: YES  POSSIBLE D/C IN few DAYS, DEPENDING ON CLINICAL CONDITION.   [name] M.D on 12/13/2017 at 2:55 PM  Between 7am to 6pm - Pager - [phone]  After 6pm go to [email or web address] - Proofreader  [facility]  Office  [phone]  CC: Primary care physician; [name], MD  Note: This dictation was prepared with Dragon dictation along with smaller phrase technology. Any transcriptional errors that result from this process are unintentional.

## 2017-12-13 NOTE — Progress Notes (Signed)
[facility]  ROUNDING NOTE   Subjective:   Feels fair today.  Appetite is still poor. 3 of his daughters are in the room with him.  Continues to have lower extremity edema. Urine output remains poor at  Less than 100 cc Foley catheter has not been removed  Objective:  Vital signs in last 24 hours:  Temp:  [97.6 F (36.4 C)-97.8 F (36.6 C)] 97.6 F (36.4 C) (05/03 0516) Pulse Rate:  [66-75] 72 (05/03 0751) Resp:  [16-26] 16 (05/03 0751) BP: (97-106)/(57-69) 102/68 (05/03 0751) SpO2:  [98 %-100 %] 100 % (05/03 0751) Weight:  [166 lb 1.6 oz (75.3 kg)] 166 lb 1.6 oz (75.3 kg) (05/03 0516)  Weight change: -4 lb 11.2 oz (-2.132 kg) Filed Weights   12/11/17 1230 12/12/17 0345 12/13/17 0516  Weight: 170 lb 12.8 oz (77.5 kg) 171 lb (77.6 kg) 166 lb 1.6 oz (75.3 kg)    Intake/Output: I/O last 3 completed shifts: In: 240 [P.O.:240] Out: 40 [Urine:40]   Intake/Output this shift:  No intake/output data recorded.  Physical Exam: General: NAD, laying in bed  Head: Normocephalic, atraumatic. Moist oral mucosal membranes  Eyes: Anicteric,   Neck: Supple, trachea midline  Lungs:  Clear to auscultation  Heart: No rub  Abdomen:  Soft, nontender,   Extremities:  3+ peripheral edema.   Neurologic: Nonfocal, moving all four extremities  Skin: warm    Rt femoral temp dialysis cathter    Basic Metabolic Panel: Recent Labs  Lab 12/06/17 1005 12/07/17 0623 12/08/17 0748 12/09/17 1038 12/11/17 0822  NA 137 136 132* 132* 134*  K 5.5* 5.7* 6.0* 4.8 3.4*  CL 103 106 101 96* 96*  CO2 17* 18* 16* 17* 26  GLUCOSE 78 106* 90 104* 109*  BUN 98* 107* 118* 129* 78*  CREATININE 4.28* 4.79* 5.57* 6.39* 4.97*  CALCIUM 8.6* 8.0* 8.0* 8.3* 8.3*  MG 2.8*  --   --   --   --   PHOS 6.6*  --  6.1* 7.1* 4.6    Liver Function Tests: Recent Labs  Lab 12/06/17 1005 12/08/17 0748 12/09/17 1038 12/11/17 0822  ALBUMIN 2.7* 3.5 4.6 3.8   No results for input(s): LIPASE, AMYLASE  in the last 168 hours. No results for input(s): AMMONIA in the last 168 hours.  CBC: Recent Labs  Lab 12/06/17 1005 12/07/17 0623 12/08/17 0748 12/09/17 1038 12/10/17 0534  WBC 16.9* 10.4 10.1 9.1 9.7  HGB 9.8* 8.9* 8.7* 8.1* 7.7*  HCT 31.9* 28.1* 27.1* 25.3* 23.4*  MCV 72.4* 70.7* 70.5* 70.2* 69.6*  PLT 282 246 219 215 202    Cardiac Enzymes: No results for input(s): CKTOTAL, CKMB, CKMBINDEX, TROPONINI in the last 168 hours.  BNP: Invalid input(s): POCBNP  CBG: Recent Labs  Lab 12/10/17 0751  GLUCAP 131*    Microbiology: Results for orders placed or performed during the hospital encounter of 12/01/17  MRSA PCR Screening     Status: None   Collection Time: 12/05/17  8:00 AM  Result Value Ref Range Status   MRSA by PCR NEGATIVE NEGATIVE Final    Comment:        The GeneXpert MRSA Assay (FDA approved for NASAL specimens only), is one component of a comprehensive MRSA colonization surveillance program. It is not intended to diagnose MRSA infection nor to guide or monitor treatment for MRSA infections. Performed at [facility], [address] [place], [place] [address]     Coagulation Studies: No results for input(s): LABPROT,  INR in the last 72 hours.  Urinalysis: No results for input(s): COLORURINE, LABSPEC, PHURINE, GLUCOSEU, HGBUR, BILIRUBINUR, KETONESUR, PROTEINUR, UROBILINOGEN, NITRITE, LEUKOCYTESUR in the last 72 hours.  Invalid input(s): APPERANCEUR    Imaging: No results found.   Medications:    . amiodarone  200 mg Oral BID  . aspirin  325 mg Oral Daily  . atorvastatin  40 mg Oral Daily  . guaiFENesin  600 mg Oral BID   And  . dextromethorphan  30 mg Oral BID  . feeding supplement (NEPRO CARB STEADY)  237 mL Oral TID BM  . finasteride  5 mg Oral Daily  . fluticasone  2 spray Each Nare Daily  . midodrine  5 mg Oral TID WC  . multivitamin  1 tablet Oral QHS  . multivitamin-lutein  1 capsule Oral Daily  . pantoprazole  40  mg Oral Daily   acetaminophen **OR** acetaminophen, albuterol, bisacodyl, HYDROcodone-acetaminophen, metoprolol tartrate, ondansetron **OR** ondansetron (ZOFRAN) IV, phenazopyridine, senna-docusate, silver nitrate applicators, sodium chloride  Assessment/ Plan:  [name] is a 84 y.o. black male with hypertension, hyperlipidemia, gout, ? history of prostate cancer, coronary artery disease, who was admitted to [facility] on 12/01/2017    1. Acute renal failure with hyperkalemia, hyponatremia and metabolic acidosis on chronic kidney disease stage III with bland urinalysis. Baseline creatinine of 2.1, GFR of 37 on 10/15/17.  Acute renal failure secondary to acute cardiorenal syndrome, ATN from hypotensive event and then with overdiuresis Plan for HD today then remove dialysis cathter; may need permcath early next week  Start d/c planning for outpatient HD (as ARF)  2. Hyperkalemia now hypokalemia Corrected with HD, now Low K  3. Gout: with hyperuricemia.  - Appreciate rheumatology, podiatry input  4. LE edema, CAD, A Fib, Severe MR  - likely cause of his severe lower extremity edema - volume removal with will be difficult with low BP, A Fib - continued on midodrine  5.  H/o prostate cancer 2016-2017 Adenocarcinoma- Seen by Dr Erlene Quan in 07/2015- missed f/u since then Treated with Finasteride PSA ordered  6. Cardiac- CAD with DES, A Fib, Ch Sys CHF 40-45% Seen by Dr Saralyn Pilar this admission D/w patient's daughters and Dr Serita Grit    LOS: [place] 5/3/20199:44 AM

## 2017-12-13 NOTE — Progress Notes (Signed)
Pre HD assessment    12/13/17 1339  Neurological  Level of Consciousness Alert  Orientation Level Oriented X4  Respiratory  Respiratory Pattern Regular;Unlabored  Chest Assessment Chest expansion symmetrical  Cardiac  ECG Monitor Yes  Vascular  R Radial Pulse +2  L Radial Pulse +2  Edema Right lower extremity;Left lower extremity  Integumentary  Integumentary (WDL) X  Skin Color Appropriate for ethnicity  Musculoskeletal  Musculoskeletal (WDL) X  Generalized Weakness Yes  Assistive Device None  GU Assessment  Genitourinary (WDL) X  Genitourinary Symptoms  (HD)  Psychosocial  Psychosocial (WDL) WDL

## 2017-12-13 NOTE — Progress Notes (Signed)
PT Cancellation Note  Patient Details Name: Andrew [name] MRN: [id number] DOB: 1932/12/16   Cancelled Treatment:    Reason Eval/Treat Not Completed: Patient at procedure or test/unavailable.  Pt currently off unit at dialysis.  Will re-attempt PT session at a later date/time.  Leitha Bleak, PT 12/13/17, 3:26 PM [phone]

## 2017-12-13 NOTE — Plan of Care (Signed)
  Problem: Education: Goal: Knowledge of General Education information will improve Outcome: Progressing   Problem: Nutrition: Goal: Adequate nutrition will be maintained Outcome: Not Progressing

## 2017-12-13 NOTE — Progress Notes (Signed)
HD tx start    12/13/17 1342  Vital Signs  Pulse Rate 77  Pulse Rate Source Monitor  Resp (!) 24  BP 104/65  BP Location Right Arm  BP Method Automatic  Patient Position (if appropriate) Lying  Oxygen Therapy  SpO2 100 %  O2 Device Room Air  During Hemodialysis Assessment  Blood Flow Rate (mL/min) 300 mL/min  Arterial Pressure (mmHg) -120 mmHg  Venous Pressure (mmHg) 110 mmHg  Transmembrane Pressure (mmHg) 60 mmHg  Ultrafiltration Rate (mL/min) 830 mL/min  Dialysate Flow Rate (mL/min) 600 ml/min  Conductivity: Machine  13.7  HD Safety Checks Performed Yes  Dialysis Fluid Bolus Normal Saline  Bolus Amount (mL) 250 mL  Intra-Hemodialysis Comments Tx initiated  Hemodialysis Catheter Right Femoral vein Triple-lumen  Placement Date/Time: 12/09/17 1900   Orientation: Right  Access Location: Femoral vein  Hemodialysis Catheter Type: Triple-lumen  Blue Lumen Status Infusing  Red Lumen Status Infusing

## 2017-12-13 NOTE — Care Management Important Message (Signed)
Copy of signed IM left in patient's room.    

## 2017-12-13 NOTE — Evaluation (Signed)
Occupational Therapy Evaluation Patient Details Name: Andrew [name] MRN: [id number] DOB: May 20, 1933 Today's Date: 12/13/2017    History of Present Illness Pt. is an 84 yo male with onset of renal failure and LE edema with dizziness, SOB, elevated troponin, and concern for stroke was admitted to [facility]. Pt. is new to dialysis. Pt. has been having VT and acute CHF. PMHx:  CKD 4, mitral valve disorder, HTN, atherosclerosis, lung nodule, OA    Clinical Impression   Pt. presents with general weakness, LUE weakness, limited activity tolerance, and currently has a temporary femoral catheter in place for dialysis which limits the ability to complete basic ADL and IADL functioning. Pt. resides at home alone. Pt. was independent with ADLs, and IADL functioning: including meal preparation, medication management, and driving. Pt. has a supportive family. Pt. Initial visit has been completed. Pt. was seen at bed level secondary to temporal femoral catheter in place. Pt. could benefit from further assessment of ADL functioning once the catheter is removed. Pt. worked on bilateral UE strengthening with yellow theraband, with the exception of the LUE which pt. performed AROM reps. Pt. Could benefit from OT services for ADL training, A/E training, and pt. Education about home modification, and DME. Pt. Could benefit from follow-up OT services upon discharge.   Follow Up Recommendations  Home health OT    Equipment Recommendations       Recommendations for Other Services       Precautions / Restrictions Precautions Precautions: Fall Precaution Comments: Temporary femoral cath - RLE Restrictions Weight Bearing Restrictions: No      Mobility Bed Mobility    Deferred secondary temporary femoral catheter.              Transfers    Deferred secondary to temporary femoral catheter                  Balance                                           ADL either performed or  assessed with clinical judgement   ADL Overall ADL's : Needs assistance/impaired Eating/Feeding: Independent;Set up;Bed level   Grooming: Set up;Independent;Bed level                                 General ADL Comments: Pt. has a temporal femoral catheter in place which is hindering mobility, and LE ADL status.     Vision         Perception     Praxis      Pertinent Vitals/Pain Pain Assessment: No/denies pain     Hand Dominance Right   Extremity/Trunk Assessment Upper Extremity Assessment Upper Extremity Assessment: Generalized weakness;LUE deficits/detail LUE Deficits / Details: Left shoulder flexion, abduction 3+/5 initially, however weakens with AROM reps. elbow flexion, and extension 4/5           Communication     Cognition Arousal/Alertness: Awake/alert Behavior During Therapy: WFL for tasks assessed/performed Overall Cognitive Status: Within Functional Limits for tasks assessed                                     General Comments       Exercises Other Exercises Other Exercises: BUE with  yellow theraband for right shoulder flexion, horizontal abduction, diagonal Abduction, Bilateral elbow flexion, and extension.   Shoulder Instructions      Home Living Family/patient expects to be discharged to:: Private residence Living Arrangements: Alone Available Help at Discharge: Family;Available PRN/intermittently Type of Home: House Home Access: Level entry     Home Layout: Multi-level Alternate Level Stairs-Number of Steps: 14 Alternate Level Stairs-Rails: Left     Bathroom Toilet: Standard     Home Equipment: Cane - single point          Prior Functioning/Environment Level of Independence: Independent                 OT Problem List: Decreased strength;Impaired UE functional use;Decreased activity tolerance;Decreased knowledge of use of DME or AE      OT Treatment/Interventions: Self-care/ADL  training;Therapeutic exercise;Neuromuscular education;Patient/family education;Therapeutic activities;DME and/or AE instruction    OT Goals(Current goals can be found in the care plan section)    OT Frequency: Min 2X/week   Barriers to D/C:            Co-evaluation              AM-PAC PT "6 Clicks" Daily Activity     Outcome Measure Help from another person eating meals?: None Help from another person taking care of personal grooming?: A Little Help from another person toileting, which includes using toliet, bedpan, or urinal?: Total Help from another person bathing (including washing, rinsing, drying)?: A Lot Help from another person to put on and taking off regular upper body clothing?: A Little Help from another person to put on and taking off regular lower body clothing?: A Lot 6 Click Score: 15   End of Session Equipment Utilized During Treatment: Gait belt  Activity Tolerance: Patient tolerated treatment well Patient left: in bed                   Time: 1031-1102 OT Time Calculation (min): 31 min Charges:  OT General Charges $OT Visit: 1 Visit OT Evaluation $OT Eval Low Complexity: 1 Low OT Treatments $Self Care/Home Management : 8-22 mins G-Codes:     Harrel Carina, MS, OTR/L   Harrel Carina, MS, OTR/L 12/13/2017, 11:55 AM

## 2017-12-13 NOTE — Progress Notes (Signed)
PT Cancellation Note  Patient Details Name: Andrew [name] MRN: [id number] DOB: 04/24/1933   Cancelled Treatment:    Reason Eval/Treat Not Completed: Other (comment).  Upon PT entering room, nursing present who was working on pt's medications and also nurse requesting PT to attempt therapy later d/t plan for pt to attempt to eat at this time.  Will re-attempt PT treatment session later as able.  Per MD Posey Pronto, plan for temporary fem cath removal after dialysis today and permcath placement Monday.  Leitha Bleak, PT 12/13/17, 9:53 AM [phone]

## 2017-12-13 NOTE — Progress Notes (Signed)
Post HD assessment. Pt tolerated tx well without c/o or complications. Net UF 2025, goal met.    12/13/17 1704  Vital Signs  Temp (!) 97.5 F (36.4 C)  Temp Source Oral  Pulse Rate 82  Pulse Rate Source Monitor  Resp 19  BP (!) 98/59  BP Location Right Arm  BP Method Automatic  Patient Position (if appropriate) Lying  Oxygen Therapy  SpO2 96 %  O2 Device Room Air  Dialysis Weight  Weight 76 kg (167 lb 8.8 oz)  Type of Weight Post-Dialysis  Post-Hemodialysis Assessment  Rinseback Volume (mL) 250 mL  KECN 53.6 V  Dialyzer Clearance Lightly streaked  Duration of HD Treatment -hour(s) 3 hour(s)  Hemodialysis Intake (mL) 500 mL  UF Total -Machine (mL) 2525 mL  Net UF (mL) 2025 mL  Tolerated HD Treatment Yes  Education / Care Plan  Dialysis Education Provided Yes  Documented Education in Care Plan Yes  Hemodialysis Catheter Right Femoral vein Triple-lumen  Placement Date/Time: 12/09/17 1900   Orientation: Right  Access Location: Femoral vein  Hemodialysis Catheter Type: Triple-lumen  Site Condition No complications  Blue Lumen Status Heparin locked  Red Lumen Status Heparin locked  Purple Lumen Status N/A  Catheter fill solution Heparin 1000 units/ml  Catheter fill volume (Arterial) 1.8 cc  Catheter fill volume (Venous) 1.8  Dressing Type Biopatch  Dressing Status Clean;Dry;Intact  Drainage Description None  Post treatment catheter status Capped and Clamped

## 2017-12-13 NOTE — Progress Notes (Signed)
Post HD assessment.    12/13/17 1708  Neurological  Level of Consciousness Alert  Orientation Level Oriented X4  Respiratory  Respiratory Pattern Regular;Unlabored  Chest Assessment Chest expansion symmetrical  Cardiac  ECG Monitor Yes  Vascular  R Radial Pulse +2  L Radial Pulse +2  Edema Right lower extremity;Left lower extremity;Right upper extremity;Left upper extremity  Integumentary  Integumentary (WDL) X  Skin Color Appropriate for ethnicity  Musculoskeletal  Musculoskeletal (WDL) X  Generalized Weakness Yes  Assistive Device None  GU Assessment  Genitourinary (WDL) X  Genitourinary Symptoms  (HD)  Psychosocial  Psychosocial (WDL) WDL

## 2017-12-13 NOTE — Progress Notes (Signed)
Post HD assessment    12/13/17 1655  Vital Signs  Pulse Rate 79  Pulse Rate Source Monitor  Resp 20  BP 93/64  BP Location Right Arm  BP Method Automatic  Patient Position (if appropriate) Lying  Oxygen Therapy  SpO2 98 %  O2 Device Room Air  During Hemodialysis Assessment  Dialysis Fluid Bolus Normal Saline  Bolus Amount (mL) 250 mL  Intra-Hemodialysis Comments Tx completed

## 2017-12-14 LAB — CBC
HEMATOCRIT: 25.5 % — AB (ref 40.0–52.0)
Hemoglobin: 7.9 g/dL — ABNORMAL LOW (ref 13.0–18.0)
MCH: 21.8 pg — ABNORMAL LOW (ref 26.0–34.0)
MCHC: 30.9 g/dL — ABNORMAL LOW (ref 32.0–36.0)
MCV: 70.4 fL — AB (ref 80.0–100.0)
PLATELETS: 205 10*3/uL (ref 150–440)
RBC: 3.62 MIL/uL — AB (ref 4.40–5.90)
RDW: 20.6 % — ABNORMAL HIGH (ref 11.5–14.5)
WBC: 9.9 10*3/uL (ref 3.8–10.6)

## 2017-12-14 LAB — BASIC METABOLIC PANEL
ANION GAP: 13 (ref 5–15)
BUN: 46 mg/dL — ABNORMAL HIGH (ref 6–20)
CO2: 27 mmol/L (ref 22–32)
Calcium: 8.6 mg/dL — ABNORMAL LOW (ref 8.9–10.3)
Chloride: 95 mmol/L — ABNORMAL LOW (ref 101–111)
Creatinine, Ser: 4.15 mg/dL — ABNORMAL HIGH (ref 0.61–1.24)
GFR calc Af Amer: 14 mL/min — ABNORMAL LOW (ref >60)
GFR, EST NON AFRICAN AMERICAN: 12 mL/min — AB (ref >60)
GLUCOSE: 168 mg/dL — AB (ref 65–99)
POTASSIUM: 3.6 mmol/L (ref 3.5–5.1)
Sodium: 135 mmol/L (ref 135–145)

## 2017-12-14 MED ORDER — MENTHOL 3 MG MT LOZG
1.0000 | LOZENGE | OROMUCOSAL | Status: DC | PRN
  Administered 2017-12-14: 3 mg via ORAL
  Filled 2017-12-14: qty 9

## 2017-12-14 NOTE — Evaluation (Signed)
Physical Therapy Re-Evaluation Patient Details Name: Andrew [name] MRN: [id number] DOB: 12-30-1932 Today's Date: 12/14/2017   History of Present Illness  Pt. is an 84 yo male with onset of renal failure and LE edema with dizziness, SOB, elevated troponin, and concern for stroke was admitted to [facility]. Pt. is new to dialysis. Pt. has been having VT and acute CHF. PMHx:  CKD 4, mitral valve disorder, HTN, atherosclerosis, lung nodule, OA. Had his temp fem cath removed after HD on 12/13/2017.   Clinical Impression  Patient had temp fem cath removed after HD yesterday and was able to perform OOB mobility in this session. He demonstrates generalized deconditioning requiring rest break in chair after initial transfer and required multiple rest breaks in subsequent gait training bouts. He required assistance to perform transfers in and out of bed, and did not seem to have insight into his current physical state. He would benefit from SNF placement at this time due to generalized deconditioning to allow improved functional mobility after discharge.     Follow Up Recommendations SNF    Equipment Recommendations  Rolling walker with 5" wheels    Recommendations for Other Services       Precautions / Restrictions Precautions Precautions: Fall Restrictions Weight Bearing Restrictions: No      Mobility  Bed Mobility Overal bed mobility: Needs Assistance Bed Mobility: Supine to Sit     Supine to sit: Min guard;Min assist     General bed mobility comments: Patient required min A to bring torso upright and performed transfer slowly, no loss of balance noted.   Transfers Overall transfer level: Needs assistance Equipment used: Rolling walker (2 wheeled);1 person hand held assist Transfers: Sit to/from Stand Sit to Stand: Min guard;Min assist         General transfer comment: Patient is able to transfer with assistance from UEs on RW without loss of balance, performed slowly.    Ambulation/Gait Ambulation/Gait assistance: Min guard Ambulation Distance (Feet): 5 Feet Assistive device: Rolling walker (2 wheeled) Gait Pattern/deviations: Drifts right/left;Shuffle;Decreased stride length;Narrow base of support   Gait velocity interpretation: <1.31 ft/sec, indicative of household ambulator General Gait Details: Patient transfers from bed to chair with use of RW with minimal step lengths.   Stairs            Wheelchair Mobility    Modified Rankin (Stroke Patients Only)       Balance Overall balance assessment: Needs assistance Sitting-balance support: Feet supported;Bilateral upper extremity supported Sitting balance-Leahy Scale: Fair     Standing balance support: Bilateral upper extremity supported;During functional activity Standing balance-Leahy Scale: Fair                               Pertinent Vitals/Pain Pain Assessment: No/denies pain    Home Living Family/patient expects to be discharged to:: Private residence Living Arrangements: Alone Available Help at Discharge: Family;Available PRN/intermittently Type of Home: House Home Access: Level entry     Home Layout: Multi-level Home Equipment: Cane - single point      Prior Function Level of Independence: Independent         Comments: per pt he is still driving     Hand Dominance   Dominant Hand: Right    Extremity/Trunk Assessment   Upper Extremity Assessment Upper Extremity Assessment: Generalized weakness    Lower Extremity Assessment Lower Extremity Assessment: Generalized weakness    Cervical / Trunk Assessment Cervical / Trunk Assessment:  Normal  Communication   Communication: HOH  Cognition Arousal/Alertness: Awake/alert Behavior During Therapy: WFL for tasks assessed/performed Overall Cognitive Status: Within Functional Limits for tasks assessed                                        General Comments General comments  (skin integrity, edema, etc.): No drainage noted from incision site before or after activity.     Exercises Other Exercises Other Exercises: several additional bouts of ambulation performed after patient had rest break in chair. He was able to ambulate roughly 15-30' in each of 2 separate bouts (required rest breaks secondary to fatigue). Cuing for appropriate use of RW.    Assessment/Plan    PT Assessment Patient needs continued PT services  PT Problem List         PT Treatment Interventions DME instruction;Gait training;Stair training;Functional mobility training;Therapeutic activities;Therapeutic exercise;Balance training;Neuromuscular re-education;Patient/family education    PT Goals (Current goals can be found in the Care Plan section)  Acute Rehab PT Goals Patient Stated Goal: to walk and get home soon PT Goal Formulation: With patient Time For Goal Achievement: 12/28/17 Potential to Achieve Goals: Good    Frequency Min 2X/week   Barriers to discharge Inaccessible home environment;Decreased caregiver support Multifloor home    Co-evaluation               AM-PAC PT "6 Clicks" Daily Activity  Outcome Measure Difficulty turning over in bed (including adjusting bedclothes, sheets and blankets)?: A Little Difficulty moving from lying on back to sitting on the side of the bed? : A Little Difficulty sitting down on and standing up from a chair with arms (e.g., wheelchair, bedside commode, etc,.)?: A Little Help needed moving to and from a bed to chair (including a wheelchair)?: A Little Help needed walking in hospital room?: A Little Help needed climbing 3-5 steps with a railing? : Total 6 Click Score: 16    End of Session Equipment Utilized During Treatment: Gait belt Activity Tolerance: Patient tolerated treatment well;Patient limited by fatigue Patient left: with call bell/phone within reach;in chair;with chair alarm set Nurse Communication: Mobility status PT  Visit Diagnosis: Unsteadiness on feet (R26.81);Muscle weakness (generalized) (M62.81);Adult, failure to thrive (R62.7)    Time: 0093-8182 PT Time Calculation (min) (ACUTE ONLY): 26 min   Charges:   PT Evaluation $PT Re-evaluation: 1 Re-eval PT Treatments $Gait Training: 8-22 mins   PT G Codes:        Royce Macadamia PT, DPT, CSCS    12/14/2017, 1:20 PM

## 2017-12-14 NOTE — Progress Notes (Signed)
CCMD reporting 10 beat run of v.tach / pt asymptotic/ MD paged to make aware/ will continue to monitor

## 2017-12-14 NOTE — Progress Notes (Addendum)
[place] [facility] [place] NAME: Andrew [name]    MR#:  [id number]  DATE OF BIRTH:  11/22/1932  SUBJECTIVE:  CHIEF COMPLAINT:   Chief Complaint  Patient presents with  . Dizziness  . Leg Swelling  . Shortness of Breath   -Still has lower extremity edema.  Had dialysis yesterday and temporary catheter removed. -Scheduled for permacath on Monday  REVIEW OF SYSTEMS:  Review of Systems  Constitutional: Positive for malaise/fatigue. Negative for chills and fever.  HENT: Negative for congestion, ear discharge, hearing loss and nosebleeds.   Eyes: Negative for blurred vision and double vision.  Respiratory: Negative for cough, shortness of breath and wheezing.   Cardiovascular: Positive for leg swelling. Negative for chest pain and palpitations.  Gastrointestinal: Negative for abdominal pain, constipation, diarrhea, nausea and vomiting.  Genitourinary: Negative for dysuria.  Musculoskeletal: Negative for myalgias.  Neurological: Negative for dizziness, focal weakness, seizures, weakness and headaches.  Psychiatric/Behavioral: Negative for depression.    DRUG ALLERGIES:  No Known Allergies  VITALS:  Blood pressure 100/65, pulse 77, temperature 98 F (36.7 C), temperature source Oral, resp. rate 18, height 5\' 9"  (1.753 m), weight 74.8 kg (165 lb), SpO2 95 %.  PHYSICAL EXAMINATION:  Physical Exam  GENERAL:  84 y.o.-year-old patient lying in the bed with no acute distress.  EYES: Pupils equal, round, reactive to light and accommodation. No scleral icterus. Extraocular muscles intact.  HEENT: Head atraumatic, normocephalic. Oropharynx and nasopharynx clear.  NECK:  Supple, no jugular venous distention. No thyroid enlargement, no tenderness.  LUNGS: Normal breath sounds bilaterally, no wheezing, rales,rhonchi or crepitation. No use of accessory muscles of respiration.  Decreased bibasilar breath sounds noted CARDIOVASCULAR: S1, S2 normal. No  murmurs, rubs, or gallops.  ABDOMEN: Soft, nontender, nondistended. Bowel sounds present. No organomegaly or mass.  EXTREMITIES: 3+ pedal edema noted. No  cyanosis, or clubbing. Right groin catheter removed, no bleeding NEUROLOGIC: Cranial nerves II through XII are intact. Muscle strength 5/5 in all extremities. Sensation intact. Gait not checked. Global weakness PSYCHIATRIC: The patient is alert and oriented x 3.  SKIN: No obvious rash, lesion, or ulcer.    LABORATORY PANEL:   CBC Recent Labs  Lab 12/10/17 0534  WBC 9.7  HGB 7.7*  HCT 23.4*  PLT 202   ------------------------------------------------------------------------------------------------------------------  Chemistries  Recent Labs  Lab 12/11/17 0822 12/13/17 1416  NA 134*  --   K 3.4*  --   CL 96*  --   CO2 26  --   GLUCOSE 109*  --   BUN 78*  --   CREATININE 4.97*  --   CALCIUM 8.3*  --   MG  --  2.2   ------------------------------------------------------------------------------------------------------------------  Cardiac Enzymes No results for input(s): TROPONINI in the last 168 hours. ------------------------------------------------------------------------------------------------------------------  RADIOLOGY:  No results found.  EKG:   Orders placed or performed during the hospital encounter of 12/01/17  . EKG 12-Lead  . EKG 12-Lead  . ED EKG  . ED EKG    ASSESSMENT AND PLAN:   84 y.o.malewith a known history of multiple medical problemsincluding CAD, hypertension, hyperlipidemia, CKD, prostate cancer and a lung nodule came in secondary to worsening lower extremity edema and dizziness.  * Acute renal failure on CKD stage3.  -Could be secondary to ATN.  Creatinine worsened up to 6.3, -Started on hemodialysis here on 12/09/17 -With minimal urine output -Temporary dialysis catheter removed yesterday after dialysis.  Plan to place permacath on Monday and  dialysis after that.  Will need  outpatient dialysis arrangement -Appreciate nephrology consult  * AF with RVR and multiple runs of VT---pt is now in NSR -On oral amiodarone at this time. -Eliquis was started at a low dose, however held due to melena and anemia  * CAD.  -Continue aspirin and atorvastatin.  * Hyperlipidemia. -Continue atorvastatin.  *Anemia of chronic disease -hgb 9.8-- 8.9-- 8.7-- 7.7  -No indication for transfusion yet unless less than 7.  Monitor hemoglobin  * acute on chronic systolic & diastolic CHF: -Anasarca, lower extremity edema. -Improving with dialysis.  Monitor  *DVT prophylaxis-teds and SCDs at this time.  Physical therapy consulted Updated daughter at bedside    All the records are reviewed and case discussed with Care Management/Social Workerr. Management plans discussed with the patient, family and they are in agreement.  CODE STATUS: DNR  TOTAL TIME TAKING CARE OF THIS PATIENT: 39 minutes.   POSSIBLE D/C IN 2-3 DAYS, DEPENDING ON CLINICAL CONDITION.   Gladstone Lighter M.D on 12/14/2017 at 10:15 AM  Between 7am to 6pm - Pager - [phone]  After 6pm go to [email or web address] - password [email or web address] [facility]  Office  [phone]  CC: Primary care physician; [name], MD

## 2017-12-14 NOTE — Progress Notes (Signed)
Occupational Therapy Treatment Patient Details Name: Andrew [name] MRN: [id number] DOB: July 25, 1933 Today's Date: 12/14/2017    History of present illness Pt. is an 84 yo male with onset of renal failure and LE edema with dizziness, SOB, elevated troponin, and concern for stroke was admitted to [facility]. Pt. is new to dialysis. Pt. has been having VT and acute CHF. PMHx:  CKD 4, mitral valve disorder, HTN, atherosclerosis, lung nodule, OA. Had his temp fem cath removed after HD on 12/13/2017.    OT comments  Pt in bed - had PT earlier - report he did some exercises with band yesterday - but don't have on in room - family members present - pt tolerate there ex , well - but shoulder on L  Decrease strength and did not do well with band - family to assist pt with some stabilization HEP for L shoulder and band HEP provided for R arm and L elbow - pt cont to show decrease functional mobility, act tolerance and UE strength - limiting his Independence in ADL's. Pt can benefit from cont OT services    Follow Up Recommendations    STR    Equipment Recommendations       Recommendations for Other Services      Precautions / Restrictions Precautions Precautions: Fall Restrictions Weight Bearing Restrictions: No       Mobility Bed Mobility Overal bed mobility: Needs Assistance Bed Mobility: Supine to Sit     Supine to sit: Min guard;Min assist      Transfer this date  Sit to Stand: Min guard;Min assist         .     Balance Overall balance assessment: Needs assistance Sitting-balance support: Feet supported;Bilateral upper extremity supported Sitting balance-Leahy Scale: Fair     Standing balance support: Bilateral upper extremity supported;During functional activity Standing balance-Leahy Scale: Fair                             ADL either performed or assessed with clinical judgement   ADL      ed pt and family vebally in AE reacher and sockaid use - and will be  educated further in OT for LB dressing                                          Vision       Perception     Praxis      Cognition Arousal/Alertness: Awake/alert Behavior During Therapy: WFL for tasks assessed/performed Overall Cognitive Status: Within Functional Limits for tasks assessed                                          Exercises Other Exercises Other Exercises: Bilateral UE exercises done with YTB and left with pt in room - family present to assist with pt  Other Exercises: stabilization to L shoulder flexion - 15 sec to 30 sec in supine  Other Exercises: Bilateral tricep and bicep 10 reps, shoulder ABD for R , and ext rotation seperate for  R and L - 10 reps    Shoulder Instructions       General Comments No drainage noted from incision site before or after activity.     Pertinent  Vitals/ Pain       Pain Assessment: No/denies pain  Home Living Family/patient expects to be discharged to:: Private residence Living Arrangements: Alone Available Help at Discharge: Family;Available PRN/intermittently Type of Home: House Home Access: Level entry     Home Layout: Multi-level Alternate Level Stairs-Number of Steps: 14 Alternate Level Stairs-Rails: Left     Bathroom Toilet: Standard     Home Equipment: Cane - single point          Prior Functioning/Environment Level of Independence: Independent        Comments: per pt he is still driving   Frequency           Progress Toward Goals  OT Goals(current goals can now be found in the care plan section)     Acute Rehab OT Goals Patient Stated Goal: to walk and get home soon  Plan      Co-evaluation                 AM-PAC PT "6 Clicks" Daily Activity     Outcome Measure                    End of Session  cont with progress to goals       Activity Tolerance     Patient Left     Nurse Communication          Time: 6720-9470 OT Time  Calculation (min): 19 min  Charges: OT General Charges $OT Visit: 1 Visit OT Treatments $Therapeutic Exercise: 8-22 mins     Zelig Gacek OTR/L,CLT  12/14/2017, 3:32 PM

## 2017-12-14 NOTE — Progress Notes (Signed)
[facility]  ROUNDING NOTE   Subjective:   Feels fair today.  Appetite is still poor. continues to have lower extremity edema. Urine output remains poor, almost anuric 2000 cc removed with hemodialysis yesterday Femoral dialysis catheter has been removed for mobility   Objective:  Vital signs in last 24 hours:  Temp:  [97.5 F (36.4 C)-98.1 F (36.7 C)] 98 F (36.7 C) (05/04 0817) Pulse Rate:  [68-82] 77 (05/04 0817) Resp:  [12-24] 18 (05/04 0817) BP: (93-112)/(54-90) 100/65 (05/04 0817) SpO2:  [94 %-100 %] 95 % (05/04 0817) Weight:  [165 lb (74.8 kg)-171 lb 4.8 oz (77.7 kg)] 165 lb (74.8 kg) (05/04 0415)  Weight change: 5 lb 3.2 oz (2.358 kg) Filed Weights   12/13/17 1338 12/13/17 1704 12/14/17 0415  Weight: 171 lb 4.8 oz (77.7 kg) 167 lb 8.8 oz (76 kg) 165 lb (74.8 kg)    Intake/Output: I/O last 3 completed shifts: In: -  Out: 2025 [Other:2025]   Intake/Output this shift:  No intake/output data recorded.  Physical Exam: General: NAD, sitting up in the chair  Head: Normocephalic, atraumatic. Moist oral mucosal membranes  Eyes: Anicteric,   Neck: Supple, trachea midline  Lungs:  Clear to auscultation  Heart: No rub  Abdomen:  Soft, nontender,   Extremities:  3+ peripheral edema.   Neurologic: Nonfocal, moving all four extremities  Skin: warm         Basic Metabolic Panel: Recent Labs  Lab 12/08/17 0748 12/09/17 1038 12/11/17 0822 12/13/17 1416 12/14/17 1034  NA 132* 132* 134*  --  135  K 6.0* 4.8 3.4*  --  3.6  CL 101 96* 96*  --  95*  CO2 16* 17* 26  --  27  GLUCOSE 90 104* 109*  --  168*  BUN 118* 129* 78*  --  46*  CREATININE 5.57* 6.39* 4.97*  --  4.15*  CALCIUM 8.0* 8.3* 8.3*  --  8.6*  MG  --   --   --  2.2  --   PHOS 6.1* 7.1* 4.6  --   --     Liver Function Tests: Recent Labs  Lab 12/08/17 0748 12/09/17 1038 12/11/17 0822  ALBUMIN 3.5 4.6 3.8   No results for input(s): LIPASE, AMYLASE in the last 168 hours. No  results for input(s): AMMONIA in the last 168 hours.  CBC: Recent Labs  Lab 12/08/17 0748 12/09/17 1038 12/10/17 0534 12/14/17 1034  WBC 10.1 9.1 9.7 9.9  HGB 8.7* 8.1* 7.7* 7.9*  HCT 27.1* 25.3* 23.4* 25.5*  MCV 70.5* 70.2* 69.6* 70.4*  PLT 219 215 202 205    Cardiac Enzymes: No results for input(s): CKTOTAL, CKMB, CKMBINDEX, TROPONINI in the last 168 hours.  BNP: Invalid input(s): POCBNP  CBG: Recent Labs  Lab 12/10/17 0751  GLUCAP 131*    Microbiology: Results for orders placed or performed during the hospital encounter of 12/01/17  MRSA PCR Screening     Status: None   Collection Time: 12/05/17  8:00 AM  Result Value Ref Range Status   MRSA by PCR NEGATIVE NEGATIVE Final    Comment:        The GeneXpert MRSA Assay (FDA approved for NASAL specimens only), is one component of a comprehensive MRSA colonization surveillance program. It is not intended to diagnose MRSA infection nor to guide or monitor treatment for MRSA infections. Performed at [facility], [address], [place], [place] [address]     Coagulation Studies: No results for  input(s): LABPROT, INR in the last 72 hours.  Urinalysis: No results for input(s): COLORURINE, LABSPEC, PHURINE, GLUCOSEU, HGBUR, BILIRUBINUR, KETONESUR, PROTEINUR, UROBILINOGEN, NITRITE, LEUKOCYTESUR in the last 72 hours.  Invalid input(s): APPERANCEUR    Imaging: No results found.   Medications:    . amiodarone  200 mg Oral BID  . aspirin  325 mg Oral Daily  . atorvastatin  40 mg Oral Daily  . guaiFENesin  600 mg Oral BID   And  . dextromethorphan  30 mg Oral BID  . feeding supplement (NEPRO CARB STEADY)  237 mL Oral TID BM  . finasteride  5 mg Oral Daily  . fluticasone  2 spray Each Nare Daily  . midodrine  5 mg Oral TID WC  . multivitamin  1 tablet Oral QHS  . multivitamin-lutein  1 capsule Oral Daily  . pantoprazole  40 mg Oral Daily   acetaminophen **OR** acetaminophen, albuterol,  bisacodyl, HYDROcodone-acetaminophen, menthol-cetylpyridinium, metoprolol tartrate, ondansetron **OR** ondansetron (ZOFRAN) IV, phenazopyridine, senna-docusate, silver nitrate applicators, sodium chloride  Assessment/ Plan:  [name] is a 84 y.o. black male with hypertension, hyperlipidemia, gout, ? history of prostate cancer, coronary artery disease, who was admitted to [facility] on 12/01/2017    1. Acute renal failure with hyperkalemia, hyponatremia and metabolic acidosis on chronic kidney disease stage III with bland urinalysis. Baseline creatinine of 2.1, GFR of 37 on 10/15/17.  Acute renal failure secondary to acute cardiorenal syndrome, ATN from hypotensive event and then with overdiuresis Tunneled dialysis catheter placement early next week Start d/c planning for outpatient HD (as ARF)  2. Hyperkalemia now hypokalemia Corrected with HD   3. Gout: with hyperuricemia.  - Appreciate rheumatology, podiatry input  4. LE edema, CAD, A Fib, Severe MR, pulmonary hypertension - likely cause of his severe lower extremity edema - volume removal with will be difficult with low BP, A Fib - continued on midodrine  5.  H/o prostate cancer 2016-2017 Adenocarcinoma- Seen by Dr Erlene Quan in 07/2015- missed f/u since then Treated with Finasteride PSA is elevated.  Patient will need continued follow-up with urology  6. Cardiac- CAD with DES, A Fib, Ch Sys CHF 40-45% Seen by Dr Saralyn Pilar this admission Volume removal with dialysis as tolerated Not able to use ACE inhibitor due to low blood pressure   LOS: 13 Andrew [name] 5/4/201911:57 AM

## 2017-12-15 LAB — MAGNESIUM: MAGNESIUM: 2.1 mg/dL (ref 1.7–2.4)

## 2017-12-15 MED ORDER — AMIODARONE HCL 200 MG PO TABS
400.0000 mg | ORAL_TABLET | Freq: Two times a day (BID) | ORAL | Status: DC
  Administered 2017-12-15 – 2017-12-17 (×3): 400 mg via ORAL
  Filled 2017-12-15 (×3): qty 2

## 2017-12-15 MED ORDER — AMIODARONE HCL 200 MG PO TABS
200.0000 mg | ORAL_TABLET | Freq: Once | ORAL | Status: AC
  Administered 2017-12-15: 200 mg via ORAL
  Filled 2017-12-15: qty 1

## 2017-12-15 MED ORDER — CEFAZOLIN SODIUM-DEXTROSE 1-4 GM/50ML-% IV SOLN
1.0000 g | INTRAVENOUS | Status: AC
  Filled 2017-12-15 (×2): qty 50

## 2017-12-15 MED ORDER — POTASSIUM CHLORIDE CRYS ER 20 MEQ PO TBCR
40.0000 meq | EXTENDED_RELEASE_TABLET | Freq: Once | ORAL | Status: DC
  Filled 2017-12-15: qty 2

## 2017-12-15 NOTE — Progress Notes (Signed)
Dressing to right groin is clean, dry and intact.

## 2017-12-15 NOTE — Progress Notes (Signed)
Pt. Refused HS multivitamin, guaifenesin and dextromethorphan because he is sleepy and tired. Per his daughter he is trying to sort out which medication is making him this way. It was explained these medications would not make him sleepy.

## 2017-12-15 NOTE — Progress Notes (Signed)
Paged regarding pt with several runs of nonsustained wide complex tachycardia. Review of telemetry strips and chart. Pt was admitted4/21/19 with increaseing sob.  Pt with history of dilated cardiomyopathy with ef of 45-50%, with moderate mr and tr. . Has been treated with amiodarone and is currently at 200 mg bid. K is 3.6. Baseline rhythm is nsr.  Ruled out for an mi with trivial troponin elevation in face of renal insufficiency. Was treated with lasix drip initially but started on HD due to worsening renal function and hyperkalemia. Remains somewhat hypotensive at 97/60 with heart rate of 60.  Will increase amiodarone back to 400 mg po bid and follow response.  Keep K near 4 and Mg near 2.0 Follow for further dysrhythmia.

## 2017-12-15 NOTE — Progress Notes (Signed)
Patient refused his potassium pills.  Please try again on night shift.  He accepted most of his medications after RN explained in detail why each was important.  Patient was agitated, so RN asked if he was refusing because he wanted comfort care.  RN asked patient what are his goals while in the hospital.  He said he wanted to discuss comfort care, then agreed to take his heart and BP medications.  RN informed patient to discuss comfort care with his physician if that is what he wants.  Phillis Knack, RN

## 2017-12-15 NOTE — Progress Notes (Signed)
[facility]  Daily Progress Note   Will plan on PermCath insertion on Monday (12/16/17) with Dr. Lucky Cowboy.  Marcelle Overlie PA-C 12/15/2017 2:05 PM

## 2017-12-15 NOTE — Plan of Care (Signed)
Patient is interested in comfort care.  Family is in denial.  Patient refuses medicines.  It takes a lot of effort to convince him to take any medicines.

## 2017-12-15 NOTE — Progress Notes (Signed)
[facility] [place] NAME: Andrew [name]    MR#:  [id number]  DATE OF BIRTH:  1933-07-21  SUBJECTIVE:  CHIEF COMPLAINT:   Chief Complaint  Patient presents with  . Dizziness  . Leg Swelling  . Shortness of Breath    -Doing well today.  Worked with physical therapy.  Having several runs of PVCs, patient has been asymptomatic  REVIEW OF SYSTEMS:  Review of Systems  Constitutional: Positive for malaise/fatigue. Negative for chills and fever.  HENT: Negative for congestion, ear discharge, hearing loss and nosebleeds.   Eyes: Negative for blurred vision and double vision.  Respiratory: Negative for cough, shortness of breath and wheezing.   Cardiovascular: Positive for leg swelling. Negative for chest pain and palpitations.  Gastrointestinal: Negative for abdominal pain, constipation, diarrhea, nausea and vomiting.  Genitourinary: Negative for dysuria.  Musculoskeletal: Negative for myalgias.  Neurological: Negative for dizziness, focal weakness, seizures, weakness and headaches.  Psychiatric/Behavioral: Negative for depression.    DRUG ALLERGIES:  No Known Allergies  VITALS:  Blood pressure 97/60, pulse 61, temperature 97.8 F (36.6 C), temperature source Oral, resp. rate 18, height 5\' 9"  (1.753 m), weight 75.5 kg (166 lb 6.4 oz), SpO2 99 %.  PHYSICAL EXAMINATION:  Physical Exam  GENERAL:  84 y.o.-year-old patient lying in the bed with no acute distress.  EYES: Pupils equal, round, reactive to light and accommodation. No scleral icterus. Extraocular muscles intact.  HEENT: Head atraumatic, normocephalic. Oropharynx and nasopharynx clear.  NECK:  Supple, no jugular venous distention. No thyroid enlargement, no tenderness.  LUNGS: Normal breath sounds bilaterally, no wheezing, rales,rhonchi or crepitation. No use of accessory muscles of respiration.  Decreased bibasilar breath sounds noted CARDIOVASCULAR: S1, S2 normal. No rubs, or  gallops.  2/6 systolic murmur is present ABDOMEN: Soft, nontender, nondistended. Bowel sounds present. No organomegaly or mass.  EXTREMITIES: 3+ pedal edema noted. No  cyanosis, or clubbing. Right groin catheter removed, no bleeding NEUROLOGIC: Cranial nerves II through XII are intact. Muscle strength 5/5 in all extremities. Sensation intact. Gait not checked. Global weakness PSYCHIATRIC: The patient is alert and oriented x 3.  SKIN: No obvious rash, lesion, or ulcer.    LABORATORY PANEL:   CBC Recent Labs  Lab 12/14/17 1034  WBC 9.9  HGB 7.9*  HCT 25.5*  PLT 205   ------------------------------------------------------------------------------------------------------------------  Chemistries  Recent Labs  Lab 12/13/17 1416 12/14/17 1034  NA  --  135  K  --  3.6  CL  --  95*  CO2  --  27  GLUCOSE  --  168*  BUN  --  46*  CREATININE  --  4.15*  CALCIUM  --  8.6*  MG 2.2  --    ------------------------------------------------------------------------------------------------------------------  Cardiac Enzymes No results for input(s): TROPONINI in the last 168 hours. ------------------------------------------------------------------------------------------------------------------  RADIOLOGY:  No results found.  EKG:   Orders placed or performed during the hospital encounter of 12/01/17  . EKG 12-Lead  . EKG 12-Lead  . ED EKG  . ED EKG    ASSESSMENT AND PLAN:   84 y.o.malewith a known history of multiple medical problemsincluding CAD, hypertension, hyperlipidemia, CKD, prostate cancer and a lung nodule came in secondary to worsening lower extremity edema and dizziness.  * Acute renal failure on CKD stage3.  -Could be secondary to ATN.  Creatinine worsened up to 6.3, -Started on hemodialysis here on 12/09/17 -With minimal urine output and significant anasarca -Temporary dialysis catheter removed after  dialysis.  Plan to place permacath on Monday and dialysis  after that.  Will need outpatient dialysis arrangement -Appreciate nephrology consult  * AF with RVR and multiple runs of VT---pt is now in NSR -I think several PVCs.  Appreciate cardiology input.  Increase the oral dose of amiodarone.  Also correct his potassium.  Check magnesium level. -Eliquis was started at a low dose, however held due to melena and anemia  * CAD.  -Continue aspirin and atorvastatin.  * Hyperlipidemia. -Continue atorvastatin.  *Anemia of chronic disease -hgb at 7.9 -No indication for transfusion yet unless less than 7.  Monitor hemoglobin  * acute on chronic systolic & diastolic CHF: -Anasarca, lower extremity edema. -Improving with dialysis.  Monitor  *DVT prophylaxis-teds and SCDs at this time.  Physical therapy consulted-family wants to take patient home.    All the records are reviewed and case discussed with Care Management/Social Workerr. Management plans discussed with the patient, family and they are in agreement.  CODE STATUS: DNR  TOTAL TIME TAKING CARE OF THIS PATIENT: 39 minutes.   POSSIBLE D/C IN 2-3 DAYS, DEPENDING ON CLINICAL CONDITION.   Theodosia Bahena M.D on 12/15/2017 at 1:34 PM  Between 7am to 6pm - Pager - [phone]  After 6pm go to [email or web address] - password [email or web address] [facility]  Office  [phone]  CC: Primary care physician; [name], MD

## 2017-12-15 NOTE — Progress Notes (Signed)
RN spoke with DR Tressia Miners who said to notify the cardiologist.  RN spoke with Dr. Ubaldo Glassing who said thank you after being notified of the 3 runs of v tach.  No new orders received from either MD.  Will continue to monitor.  Phillis Knack, RN

## 2017-12-15 NOTE — Progress Notes (Addendum)
Pt. c/o he needed to urinate but could not. Bladder was not distended. Pt. Was bladder scanned urine amounts arranged between 30 to  14mls. I gave pt. Tylenol given per pt  Request. Will continue to monitor pt.

## 2017-12-15 NOTE — Progress Notes (Signed)
Pt.'s daughter is concerned pt. Is sleeping to much and not eating enough. Pt. Disease process was discussed in length with daughter. She wants pt. To try homeopathic remedies to possibly "cleanse" the body. It was suggested pt., daughter and her sisters have a conference with the doctor so they could have their questions answered and get a better understanding of what is going on with the pt.

## 2017-12-15 NOTE — Progress Notes (Signed)
[facility]  ROUNDING NOTE   Subjective:   Feels fair today.  Appetite is still poor. continues to have lower extremity edema. Urine output remains poor, almost anuric 2000 cc removed with hemodialysis Friday Femoral dialysis catheter has been removed for mobility Patient continues to have large amount of lower extremity edema   Objective:  Vital signs in last 24 hours:  Temp:  [97.8 F (36.6 C)-98.2 F (36.8 C)] 97.8 F (36.6 C) (05/05 0835) Pulse Rate:  [61-70] 61 (05/05 0835) Resp:  [17-18] 18 (05/05 0835) BP: (96-100)/(59-62) 97/60 (05/05 0835) SpO2:  [95 %-100 %] 99 % (05/05 0835) Weight:  [166 lb 6.4 oz (75.5 kg)] 166 lb 6.4 oz (75.5 kg) (05/05 0457)  Weight change: -4 lb 14.4 oz (-2.221 kg) Filed Weights   12/13/17 1704 12/14/17 0415 12/15/17 0457  Weight: 167 lb 8.8 oz (76 kg) 165 lb (74.8 kg) 166 lb 6.4 oz (75.5 kg)    Intake/Output: No intake/output data recorded.   Intake/Output this shift:  No intake/output data recorded.  Physical Exam: General: NAD, sitting up in the chair  Head: Normocephalic, atraumatic. Moist oral mucosal membranes  Eyes: Anicteric,   Neck: Supple, trachea midline  Lungs:  Clear to auscultation  Heart: No rub  Abdomen:  Soft, nontender,   Extremities:  3+ peripheral edema.   Neurologic: Nonfocal, moving all four extremities  Skin: warm         Basic Metabolic Panel: Recent Labs  Lab 12/09/17 1038 12/11/17 0822 12/13/17 1416 12/14/17 1034 12/15/17 1453  NA 132* 134*  --  135  --   K 4.8 3.4*  --  3.6  --   CL 96* 96*  --  95*  --   CO2 17* 26  --  27  --   GLUCOSE 104* 109*  --  168*  --   BUN 129* 78*  --  46*  --   CREATININE 6.39* 4.97*  --  4.15*  --   CALCIUM 8.3* 8.3*  --  8.6*  --   MG  --   --  2.2  --  2.1  PHOS 7.1* 4.6  --   --   --     Liver Function Tests: Recent Labs  Lab 12/09/17 1038 12/11/17 0822  ALBUMIN 4.6 3.8   No results for input(s): LIPASE, AMYLASE in the last 168  hours. No results for input(s): AMMONIA in the last 168 hours.  CBC: Recent Labs  Lab 12/09/17 1038 12/10/17 0534 12/14/17 1034  WBC 9.1 9.7 9.9  HGB 8.1* 7.7* 7.9*  HCT 25.3* 23.4* 25.5*  MCV 70.2* 69.6* 70.4*  PLT 215 202 205    Cardiac Enzymes: No results for input(s): CKTOTAL, CKMB, CKMBINDEX, TROPONINI in the last 168 hours.  BNP: Invalid input(s): POCBNP  CBG: Recent Labs  Lab 12/10/17 0751  GLUCAP 131*    Microbiology: Results for orders placed or performed during the hospital encounter of 12/01/17  MRSA PCR Screening     Status: None   Collection Time: 12/05/17  8:00 AM  Result Value Ref Range Status   MRSA by PCR NEGATIVE NEGATIVE Final    Comment:        The GeneXpert MRSA Assay (FDA approved for NASAL specimens only), is one component of a comprehensive MRSA colonization surveillance program. It is not intended to diagnose MRSA infection nor to guide or monitor treatment for MRSA infections. Performed at [facility], [address] [place], [place] [address]  Coagulation Studies: No results for input(s): LABPROT, INR in the last 72 hours.  Urinalysis: No results for input(s): COLORURINE, LABSPEC, PHURINE, GLUCOSEU, HGBUR, BILIRUBINUR, KETONESUR, PROTEINUR, UROBILINOGEN, NITRITE, LEUKOCYTESUR in the last 72 hours.  Invalid input(s): APPERANCEUR    Imaging: No results found.   Medications:   . [START ON 12/16/2017]  ceFAZolin (ANCEF) IV     . amiodarone  400 mg Oral BID  . aspirin  325 mg Oral Daily  . atorvastatin  40 mg Oral Daily  . guaiFENesin  600 mg Oral BID   And  . dextromethorphan  30 mg Oral BID  . feeding supplement (NEPRO CARB STEADY)  237 mL Oral TID BM  . finasteride  5 mg Oral Daily  . fluticasone  2 spray Each Nare Daily  . midodrine  5 mg Oral TID WC  . multivitamin  1 tablet Oral QHS  . multivitamin-lutein  1 capsule Oral Daily  . pantoprazole  40 mg Oral Daily  . potassium chloride  40 mEq Oral  Once   acetaminophen **OR** acetaminophen, albuterol, bisacodyl, HYDROcodone-acetaminophen, menthol-cetylpyridinium, metoprolol tartrate, ondansetron **OR** ondansetron (ZOFRAN) IV, phenazopyridine, senna-docusate, silver nitrate applicators, sodium chloride  Assessment/ Plan:  [name] is a 84 y.o. black male with hypertension, hyperlipidemia, gout, ? history of prostate cancer, coronary artery disease, who was admitted to [facility] on 12/01/2017    1. Acute renal failure with hyperkalemia, hyponatremia and metabolic acidosis on chronic kidney disease stage III with bland urinalysis. Baseline creatinine of 2.1, GFR of 37 on 10/15/17.  Acute renal failure secondary to acute cardiorenal syndrome, ATN from hypotensive event and then with overdiuresis Tunneled dialysis catheter placement on Monday d/c planning for outpatient HD (as ARF)/ [place]  2. Hyperkalemia now hypokalemia Corrected with HD   3. Gout: with hyperuricemia.  - Appreciate rheumatology, podiatry input  4. LE edema, CAD, A Fib, Severe MR, pulmonary hypertension - likely cause of his severe lower extremity edema - volume removal with will be difficult with low BP, A Fib - continued on midodrine  5.  H/o prostate cancer 2016-2017 Adenocarcinoma- Seen by Dr Andrew [name] in 07/2015- missed f/u since then Treated with Finasteride PSA is elevated.  Patient will need continued follow-up with urology  6. Cardiac- CAD with DES, A Fib, Ch Sys CHF 40-45% Seen by Dr Andrew [name] this admission Volume removal with dialysis as tolerated Not able to use ACE inhibitor due to low blood pressure  Patient has multiple daughters that got involved in his medical care recently.   they did not know about his various medical conditions until recently.  Family conference with palliative care in the next 1 to 2 days may be appropriate for global understanding of his overall poor health and to establish future goals of care   LOS: [place] 5/5/20193:44 PM

## 2017-12-15 NOTE — Progress Notes (Signed)
Patient had a 5 beat run of v tach ~ 11am Patient had a 7 beat run of v tach ~ 12:12 Patient had a 10 beat run of v tach ~ 12:20  Paged MD twice.  Phillis Knack, RN

## 2017-12-15 NOTE — Progress Notes (Signed)
RN notified per CCMD of run VT while in room attempting to give medications to patient.  Patient eventually compliant with taking scheduled medications.  Skin warm, dry.  No voiced complains of shortness of breath or palpitations.

## 2017-12-15 NOTE — Progress Notes (Signed)
Patient's family said that patient has veterans benefits available and needs PT to specifically mention patient would benefit from a chair lift in order to use those benefits for a discount.  Patient's family is planning to take him home which is a 3 story home.  His bedroom is on the 3rd floor.  Family feels he will recover better at home.  Patient will need two chairs because the stairs are not in the same location.   Phillis Knack, RN

## 2017-12-16 ENCOUNTER — Telehealth: Payer: Self-pay | Admitting: Urology

## 2017-12-16 ENCOUNTER — Encounter
Admission: EM | Disposition: A | Discharge status: Home Health | Payer: Self-pay | Source: Home / Self Care | Attending: Internal Medicine

## 2017-12-16 DIAGNOSIS — N186 End stage renal disease: Secondary | ICD-10-CM

## 2017-12-16 HISTORY — PX: DIALYSIS/PERMA CATHETER INSERTION: CATH118288

## 2017-12-16 LAB — BASIC METABOLIC PANEL
Anion gap: 14 (ref 5–15)
BUN: 64 mg/dL — ABNORMAL HIGH (ref 6–20)
CO2: 26 mmol/L (ref 22–32)
Calcium: 8.5 mg/dL — ABNORMAL LOW (ref 8.9–10.3)
Chloride: 94 mmol/L — ABNORMAL LOW (ref 101–111)
Creatinine, Ser: 5.9 mg/dL — ABNORMAL HIGH (ref 0.61–1.24)
GFR calc Af Amer: 9 mL/min — ABNORMAL LOW (ref >60)
GFR calc non Af Amer: 8 mL/min — ABNORMAL LOW (ref >60)
Glucose, Bld: 94 mg/dL (ref 65–99)
POTASSIUM: 3.9 mmol/L (ref 3.5–5.1)
SODIUM: 134 mmol/L — AB (ref 135–145)

## 2017-12-16 LAB — PHOSPHORUS: PHOSPHORUS: 5.1 mg/dL — AB (ref 2.5–4.6)

## 2017-12-16 SURGERY — DIALYSIS/PERMA CATHETER INSERTION
Anesthesia: Moderate Sedation

## 2017-12-16 MED ORDER — MIDAZOLAM HCL 2 MG/2ML IJ SOLN
INTRAMUSCULAR | Status: DC | PRN
  Administered 2017-12-16: 2 mg via INTRAVENOUS

## 2017-12-16 MED ORDER — FENTANYL CITRATE (PF) 100 MCG/2ML IJ SOLN
INTRAMUSCULAR | Status: AC
  Filled 2017-12-16: qty 2

## 2017-12-16 MED ORDER — LIDOCAINE-EPINEPHRINE (PF) 1 %-1:200000 IJ SOLN
INTRAMUSCULAR | Status: AC
  Filled 2017-12-16: qty 30

## 2017-12-16 MED ORDER — FENTANYL CITRATE (PF) 100 MCG/2ML IJ SOLN
INTRAMUSCULAR | Status: DC | PRN
  Administered 2017-12-16: 50 ug via INTRAVENOUS

## 2017-12-16 MED ORDER — SODIUM CHLORIDE 0.9 % IV SOLN
INTRAVENOUS | Status: DC
  Administered 2017-12-16: 16:00:00 via INTRAVENOUS

## 2017-12-16 MED ORDER — HEPARIN SODIUM (PORCINE) 10000 UNIT/ML IJ SOLN
INTRAMUSCULAR | Status: AC
  Filled 2017-12-16: qty 1

## 2017-12-16 MED ORDER — HEPARIN (PORCINE) IN NACL 1000-0.9 UT/500ML-% IV SOLN
INTRAVENOUS | Status: AC
  Filled 2017-12-16: qty 500

## 2017-12-16 MED ORDER — MIDAZOLAM HCL 5 MG/5ML IJ SOLN
INTRAMUSCULAR | Status: AC
  Filled 2017-12-16: qty 5

## 2017-12-16 MED ORDER — BENZONATATE 100 MG PO CAPS
200.0000 mg | ORAL_CAPSULE | Freq: Three times a day (TID) | ORAL | Status: DC
  Administered 2017-12-17: 200 mg via ORAL
  Filled 2017-12-16: qty 2

## 2017-12-16 MED ORDER — CEFAZOLIN SODIUM-DEXTROSE 2-4 GM/100ML-% IV SOLN: 2.0000 g | INTRAVENOUS | Status: DC

## 2017-12-16 SURGICAL SUPPLY — 6 items
CATH PALINDROME RT-P 15FX19CM (CATHETERS) ×3 IMPLANT
DERMABOND ADVANCED (GAUZE/BANDAGES/DRESSINGS) ×2
DERMABOND ADVANCED .7 DNX12 (GAUZE/BANDAGES/DRESSINGS) ×1 IMPLANT
PACK ANGIOGRAPHY (CUSTOM PROCEDURE TRAY) ×3 IMPLANT
SUT MNCRL AB 4-0 PS2 18 (SUTURE) ×3 IMPLANT
SUT PROLENE 0 CT 1 30 (SUTURE) ×3 IMPLANT

## 2017-12-16 NOTE — Care Management Note (Signed)
Case Management Note  Patient Details  Name: Andrew [name] MRN: [id number] Date of Birth: July 29, 1933  Subjective/Objective:  Case discussed with Dr. Dede Query. Patient to discharge tomorrow. Spoke with daughter Celine Mans [phone]). She lives with patient and cares for him 24 hours per day. She states patient will need a walker. Wanted a chair lift but I explained this is not covered by insurance. Ordered walker from [place] with [facility]. Discussed home health providers and referral to [place] with  [facility] for RN, PT, OT, SW and HHA. PCP is Dr. Kary Kos. Provided daughter RNCM contact information should she have questions.   Action/Plan:   Expected Discharge Date:  12/03/17               Expected Discharge Plan:  [place]  In-House Referral:     Discharge planning Services  CM Consult  Post Acute Care Choice:  Durable Medical Equipment, Home Health Choice offered to:  Adult Children  DME Arranged:  Walker rolling DME Agency:  [facility] Arranged:  RN, PT, OT, Nurse's Aide, Social Work [employer] Agency:  [facility]  Status of Service:  In process, will continue to follow  If discussed at Long Length of Stay Meetings, dates discussed:    Additional Comments:  Jolly Mango, RN 12/16/2017, 3:35 PM

## 2017-12-16 NOTE — Op Note (Signed)
OPERATIVE NOTE    PRE-OPERATIVE DIAGNOSIS: 1. ESRD  POST-OPERATIVE DIAGNOSIS: same as above  PROCEDURE: 1. Ultrasound guidance for vascular access to the right internal jugular vein 2. Fluoroscopic guidance for placement of catheter 3. Placement of a 19 cm tip to cuff tunneled hemodialysis catheter via the right internal jugular vein  SURGEON: Leotis Pain, MD  ANESTHESIA:  Local with Moderate conscious sedation for approximately 20 minutes using 2 mg of Versed and 50 mcg of Fentanyl  ESTIMATED BLOOD LOSS: 15 cc  FLUORO TIME: less than one minute  CONTRAST: none  FINDING(S): 1.  Patent right internal jugular vein  SPECIMEN(S):  None  INDICATIONS:   Andrew [name] is a 84 y.o.male who presents with renal failure.  The patient needs long term dialysis access for their ESRD, and a Permcath is necessary.  Risks and benefits are discussed and informed consent is obtained.    DESCRIPTION: After obtaining full informed written consent, the patient was brought back to the vascular suited. The patient's right neck and chest were sterilely prepped and draped in a sterile surgical field was created. Moderate conscious sedation was administered during a face to face encounter with the patient throughout the procedure with my supervision of the RN administering medicines and monitoring the patient's vital signs, pulse oximetry, telemetry and mental status throughout from the start of the procedure until the patient was taken to the recovery room.  The right internal jugular vein was visualized with ultrasound and found to be patent. It was then accessed under direct ultrasound guidance and a permanent image was recorded. A wire was placed. After skin nick and dilatation, the peel-away sheath was placed over the wire. I then turned my attention to an area under the clavicle. Approximately 1-2 fingerbreadths below the clavicle a small counterincision was created and tunneled from the subclavicular  incision to the access site. Using fluoroscopic guidance, a 19 centimeter tip to cuff tunneled hemodialysis catheter was selected, and tunneled from the subclavicular incision to the access site. It was then placed through the peel-away sheath and the peel-away sheath was removed. Using fluoroscopic guidance the catheter tips were parked in the right atrium. The appropriate distal connectors were placed. It withdrew blood well and flushed easily with heparinized saline and a concentrated heparin solution was then placed. It was secured to the chest wall with 2 Prolene sutures. The access incision was closed single 4-0 Monocryl. A 4-0 Monocryl pursestring suture was placed around the exit site. Sterile dressings were placed. The patient tolerated the procedure well and was taken to the recovery room in stable condition.  COMPLICATIONS: None  CONDITION: Stable  Leotis Pain, MD 12/16/2017 5:24 PM   This note was created with Dragon Medical transcription system. Any errors in dictation are purely unintentional.

## 2017-12-16 NOTE — Progress Notes (Signed)
[facility]  ROUNDING NOTE   Subjective:  Patient seen at bedside. Due for dialysis today. He is also due for PermCath placement today.    Objective:  Vital signs in last 24 hours:  Temp:  [97.6 F (36.4 C)-98.1 F (36.7 C)] 98.1 F (36.7 C) (05/06 0742) Pulse Rate:  [61-67] 65 (05/06 0742) Resp:  [18-19] 19 (05/06 0742) BP: (89-103)/(53-63) 95/63 (05/06 0742) SpO2:  [100 %] 100 % (05/06 0742) Weight:  [77.7 kg (171 lb 4.8 oz)] 77.7 kg (171 lb 4.8 oz) (05/06 0416)  Weight change: 2.223 kg (4 lb 14.4 oz) Filed Weights   12/14/17 0415 12/15/17 0457 12/16/17 0416  Weight: 74.8 kg (165 lb) 75.5 kg (166 lb 6.4 oz) 77.7 kg (171 lb 4.8 oz)    Intake/Output: No intake/output data recorded.   Intake/Output this shift:  No intake/output data recorded.  Physical Exam: General: NAD, laying in bed  Head: Normocephalic, atraumatic. Moist oral mucosal membranes  Eyes: Anicteric,   Neck: Supple, trachea midline  Lungs:  Clear to auscultation  Heart: No rub S1S2  Abdomen:  Soft, nontender, BS present  Extremities:  3+ peripheral edema.   Neurologic: Nonfocal, moving all four extremities  Skin: Warm, dry, no rash         Basic Metabolic Panel: Recent Labs  Lab 12/11/17 0822 12/13/17 1416 12/14/17 1034 12/15/17 1453 12/16/17 0524  NA 134*  --  135  --  134*  K 3.4*  --  3.6  --  3.9  CL 96*  --  95*  --  94*  CO2 26  --  27  --  26  GLUCOSE 109*  --  168*  --  94  BUN 78*  --  46*  --  64*  CREATININE 4.97*  --  4.15*  --  5.90*  CALCIUM 8.3*  --  8.6*  --  8.5*  MG  --  2.2  --  2.1  --   PHOS 4.6  --   --   --   --     Liver Function Tests: Recent Labs  Lab 12/11/17 0822  ALBUMIN 3.8   No results for input(s): LIPASE, AMYLASE in the last 168 hours. No results for input(s): AMMONIA in the last 168 hours.  CBC: Recent Labs  Lab 12/10/17 0534 12/14/17 1034  WBC 9.7 9.9  HGB 7.7* 7.9*  HCT 23.4* 25.5*  MCV 69.6* 70.4*  PLT 202 205     Cardiac Enzymes: No results for input(s): CKTOTAL, CKMB, CKMBINDEX, TROPONINI in the last 168 hours.  BNP: Invalid input(s): POCBNP  CBG: Recent Labs  Lab 12/10/17 0751  GLUCAP 131*    Microbiology: Results for orders placed or performed during the hospital encounter of 12/01/17  MRSA PCR Screening     Status: None   Collection Time: 12/05/17  8:00 AM  Result Value Ref Range Status   MRSA by PCR NEGATIVE NEGATIVE Final    Comment:        The GeneXpert MRSA Assay (FDA approved for NASAL specimens only), is one component of a comprehensive MRSA colonization surveillance program. It is not intended to diagnose MRSA infection nor to guide or monitor treatment for MRSA infections. Performed at [facility], [address] [place], [place] [address]     Coagulation Studies: No results for input(s): LABPROT, INR in the last 72 hours.  Urinalysis: No results for input(s): COLORURINE, LABSPEC, PHURINE, GLUCOSEU, HGBUR, BILIRUBINUR, KETONESUR, PROTEINUR, UROBILINOGEN, NITRITE, LEUKOCYTESUR in  the last 72 hours.  Invalid input(s): APPERANCEUR    Imaging: No results found.   Medications:   .  ceFAZolin (ANCEF) IV     . amiodarone  400 mg Oral BID  . aspirin  325 mg Oral Daily  . atorvastatin  40 mg Oral Daily  . benzonatate  200 mg Oral TID  . guaiFENesin  600 mg Oral BID   And  . dextromethorphan  30 mg Oral BID  . feeding supplement (NEPRO CARB STEADY)  237 mL Oral TID BM  . finasteride  5 mg Oral Daily  . fluticasone  2 spray Each Nare Daily  . midodrine  5 mg Oral TID WC  . multivitamin  1 tablet Oral QHS  . multivitamin-lutein  1 capsule Oral Daily  . pantoprazole  40 mg Oral Daily  . potassium chloride  40 mEq Oral Once   acetaminophen **OR** acetaminophen, albuterol, bisacodyl, HYDROcodone-acetaminophen, menthol-cetylpyridinium, metoprolol tartrate, ondansetron **OR** ondansetron (ZOFRAN) IV, phenazopyridine, senna-docusate, silver nitrate  applicators, sodium chloride  Assessment/ Plan:  [name] is a 84 y.o. black male with hypertension, hyperlipidemia, gout, ? history of prostate cancer, coronary artery disease, who was admitted to [facility] on 12/01/2017    1. Acute renal failure with hyperkalemia, hyponatremia and metabolic acidosis on chronic kidney disease stage III with bland urinalysis. Baseline creatinine of 2.1, GFR of 37 on 10/15/17.  Acute renal failure secondary to acute cardiorenal syndrome, ATN from hypotensive event and then with overdiuresis -Patient due for hemodialysis today.  Orders have been prepared.  He is also to have a PermCath placed today.  2. Hyperkalemia now hypokalemia Corrected with HD   3. Gout: with hyperuricemia.  - Appreciate rheumatology, podiatry input  4. LE edema, CAD, A Fib, Severe MR, pulmonary hypertension - likely cause of his severe lower extremity edema -Continue midodrine.  Ultrafiltration target 1.5 kg.  5.  H/o prostate cancer 2016-2017 Adenocarcinoma- Seen by Dr Andrew [name] in 07/2015- missed f/u since then Treated with Finasteride PSA is elevated.  Patient will need continued follow-up with urology  6. Cardiac- CAD with DES, A Fib, Ch Sys CHF 40-45% Seen by Dr Andrew [name] this admission Volume removal with dialysis as tolerated Not able to use ACE inhibitor due to low blood pressure    LOS: 15 Andrew [name] 5/6/201912:42 PM

## 2017-12-16 NOTE — Progress Notes (Signed)
Report given/recieved. Waiting on vascular (tunneled CVC placement) to initiate HD tx.    12/16/17 1440  Hand-Off documentation  Report given to (Full Name) Stark Bray  Report received from (Full Name) Joaquin Music

## 2017-12-16 NOTE — Progress Notes (Signed)
Patient left the floor to specials for hemodialysis catheter placement.Patient is alert and oriented x4 and vital signs within normal limits upon departure.

## 2017-12-16 NOTE — Telephone Encounter (Signed)
Pt's daughter, Elisha Headland, called from [facility] to let us know pt is in hospital at [facility] (room 253A)  You saw him in 07/2015.  He has prostate cancer and is having other issues with kidneys according to daughter.  She requested a urologist round on him.

## 2017-12-16 NOTE — Progress Notes (Signed)
[facility] NAME: Andrew [name]    MR#:  [id number]  DATE OF BIRTH:  01/11/1933  SUBJECTIVE:  CHIEF COMPLAINT:   Chief Complaint  Patient presents with  . Dizziness  . Leg Swelling  . Shortness of Breath   -Feels better this morning.  For permacath placement today and dialysis after that.  REVIEW OF SYSTEMS:  Review of Systems  Constitutional: Positive for malaise/fatigue. Negative for chills and fever.  HENT: Negative for congestion, ear discharge, hearing loss and nosebleeds.   Eyes: Negative for blurred vision and double vision.  Respiratory: Negative for cough, shortness of breath and wheezing.   Cardiovascular: Positive for leg swelling. Negative for chest pain and palpitations.  Gastrointestinal: Negative for abdominal pain, constipation, diarrhea, nausea and vomiting.  Genitourinary: Negative for dysuria.  Musculoskeletal: Negative for myalgias.  Neurological: Negative for dizziness, focal weakness, seizures, weakness and headaches.  Psychiatric/Behavioral: Negative for depression.    DRUG ALLERGIES:  No Known Allergies  VITALS:  Blood pressure 95/63, pulse 65, temperature 98.1 F (36.7 C), temperature source Oral, resp. rate 19, height 5\' 9"  (1.753 m), weight 77.7 kg (171 lb 4.8 oz), SpO2 100 %.  PHYSICAL EXAMINATION:  Physical Exam  GENERAL:  84 y.o.-year-old patient lying in the bed with no acute distress.  EYES: Pupils equal, round, reactive to light and accommodation. No scleral icterus. Extraocular muscles intact.  HEENT: Head atraumatic, normocephalic. Oropharynx and nasopharynx clear.  NECK:  Supple, no jugular venous distention. No thyroid enlargement, no tenderness.  LUNGS: Normal breath sounds bilaterally, no wheezing, rales,rhonchi or crepitation. No use of accessory muscles of respiration.  Decreased bibasilar breath sounds noted CARDIOVASCULAR: S1, S2 normal. No rubs, or gallops.  2/6 systolic  murmur is present ABDOMEN: Soft, nontender, nondistended. Bowel sounds present. No organomegaly or mass.  EXTREMITIES: 3+ pedal edema noted. No  cyanosis, or clubbing. Right groin catheter removed, no bleeding NEUROLOGIC: Cranial nerves II through XII are intact. Muscle strength 5/5 in all extremities. Sensation intact. Gait not checked. Global weakness PSYCHIATRIC: The patient is alert and oriented x 3.  SKIN: No obvious rash, lesion, or ulcer.    LABORATORY PANEL:   CBC Recent Labs  Lab 12/14/17 1034  WBC 9.9  HGB 7.9*  HCT 25.5*  PLT 205   ------------------------------------------------------------------------------------------------------------------  Chemistries  Recent Labs  Lab 12/15/17 1453 12/16/17 0524  NA  --  134*  K  --  3.9  CL  --  94*  CO2  --  26  GLUCOSE  --  94  BUN  --  64*  CREATININE  --  5.90*  CALCIUM  --  8.5*  MG 2.1  --    ------------------------------------------------------------------------------------------------------------------  Cardiac Enzymes No results for input(s): TROPONINI in the last 168 hours. ------------------------------------------------------------------------------------------------------------------  RADIOLOGY:  No results found.  EKG:   Orders placed or performed during the hospital encounter of 12/01/17  . EKG 12-Lead  . EKG 12-Lead  . ED EKG  . ED EKG    ASSESSMENT AND PLAN:   84 y.o.malewith a known history of multiple medical problemsincluding CAD, hypertension, hyperlipidemia, CKD, prostate cancer and a lung nodule came in secondary to worsening lower extremity edema and dizziness.  * Acute renal failure on CKD stage3.  -Could be secondary to ATN.  Creatinine worsened up to 6.3, -Started on hemodialysis here on 12/09/17 -With minimal urine output and significant anasarca -Temporary dialysis catheter removed.  Plan to place permacath today and  dialysis after that.  Will need outpatient dialysis  arrangement- likely will be set up this week. -Appreciate nephrology consult  * AF with RVR and multiple runs of VT---pt is now in NSR -has several PVCs.  Appreciate cardiology input.  Increase the oral dose of amiodarone.  Continue to monitor potassium and magnesium level. -Eliquis was started at a low dose, however held due to melena and anemia -Continue aspirin  * CAD.  -Continue aspirin and atorvastatin.  * Hyperlipidemia. -Continue atorvastatin.  *Anemia of chronic disease -hgb at 7.9 -No indication for transfusion yet unless less than 7.  Monitor hemoglobin  * acute on chronic systolic & diastolic CHF: -Anasarca, lower extremity edema. Unable to pull off fluid during dialysis due to low BP - Monitor  * Elevated PSA-patient was followed by urology in 2016, PSA was 3 back then and prostate biopsy showed low risk adenocarcinoma at the time.  He was advised radiation therapy and outpatient follow-up which he did not follow back then. -PSA elevated to 11. -Discussed with daughter, he needs to follow-up as outpatient  *DVT prophylaxis-teds and SCDs at this time.  Physical therapy consulted-family wants to take patient home. Home health will be arranged at discharge    All the records are reviewed and case discussed with Care Management/Social Workerr. Management plans discussed with the patient, family and they are in agreement.  CODE STATUS: DNR  TOTAL TIME TAKING CARE OF THIS PATIENT: 36 minutes.   POSSIBLE D/C IN 1-2 DAYS, DEPENDING ON CLINICAL CONDITION.   Gladstone Lighter M.D on 12/16/2017 at 2:37 PM  Between 7am to 6pm - Pager - [phone]  After 6pm go to [email or web address] - password [email or web address] [facility]  Office  [phone]  CC: Primary care physician; [name], MD

## 2017-12-16 NOTE — Progress Notes (Signed)
Post HD assessment. Pt tolerated tx well without c/o or complications. Newly placed perm cath ran well, without complication. Net UF 561, goal met.    12/16/17 2010  Vital Signs  Temp (!) 97.4 F (36.3 C)  Temp Source Oral  Pulse Rate 71  Pulse Rate Source Monitor  Resp 19  BP (!) 94/58  BP Location Right Arm  BP Method Automatic  Patient Position (if appropriate) Lying  Oxygen Therapy  SpO2 98 %  O2 Device Room Air  Dialysis Weight  Weight 78 kg (171 lb 15.3 oz)  Type of Weight Post-Dialysis  Post-Hemodialysis Assessment  Rinseback Volume (mL) 250 mL  KECN 50.5 V  Dialyzer Clearance Lightly streaked  Duration of HD Treatment -hour(s) 2 hour(s)  Hemodialysis Intake (mL) 500 mL  UF Total -Machine (mL) 1061 mL  Net UF (mL) 561 mL  Tolerated HD Treatment Yes  Education / Care Plan  Dialysis Education Provided Yes  Documented Education in Care Plan Yes

## 2017-12-16 NOTE — Clinical Social Work Note (Signed)
CSW received referral for SNF.  Case discussed with case manager and plan is to discharge home with home health.  CSW to sign off please re-consult if social work needs arise.  Denham Mose R. Ciaran Begay, MSW, LCSWA [phone]  

## 2017-12-16 NOTE — Progress Notes (Signed)
Physical Therapy Treatment Patient Details Name: Andrew [name] MRN: [id number] DOB: 1933/03/30 Today's Date: 12/16/2017    History of Present Illness Pt. is an 84 yo male with onset of renal failure and LE edema with dizziness, SOB, elevated troponin, and concern for stroke was admitted to [facility]. Pt. is new to dialysis. Pt. has been having VT and acute CHF. PMHx:  CKD 4, mitral valve disorder, HTN, atherosclerosis, lung nodule, OA. Had his temp fem cath removed after HD on 12/13/2017.     PT Comments    Pt NPO for procedure today (plan for permcath placement).  Pt able to ambulate 10 feet x2 with RW CGA but pt appearing fatigued with distance ambulated and tending to turn and sit quickly (so only one hip lined up to touch chair once sitting) requiring assist of therapist so both hips touching chair safely upon sitting.  Pt reporting tolerating ambulating trials well but pt appearing fatigued post ambulation and requiring a few minutes sitting rest break between ambulation trials.  Will continue to progress pt with strengthening and progressive ambulation distance next session.  Currently therapy recommending discharge to STR but pt/pt's family requesting pt discharge home and pt's daughter (who was present during session) was discussing benefit of chair lift (pt's bedroom on 3rd floor of home); d/t noted mobility levels, generalized weakness, and impaired activity tolerance, anticipate pt may benefit from consideration of a chair lift.   Follow Up Recommendations  SNF     Equipment Recommendations  Rolling walker with 5" wheels    Recommendations for Other Services OT consult     Precautions / Restrictions Precautions Precautions: Fall Restrictions Weight Bearing Restrictions: No    Mobility  Bed Mobility Overal bed mobility: Needs Assistance Bed Mobility: Supine to Sit     Supine to sit: Supervision;HOB elevated     General bed mobility comments: increased effort to perform on  own  Transfers Overall transfer level: Needs assistance Equipment used: Rolling walker (2 wheeled) Transfers: Sit to/from Stand Sit to Stand: Min guard;Min assist         General transfer comment: CGA standing from bed x1 trial and from recliner x2 trials with RW; pt requiring min assist to sit down safely in chair x2 trials d/t pt turning and sitting quickly (and not lining up to chair safely) d/t fatigue post ambulation  Ambulation/Gait Ambulation/Gait assistance: Min guard Ambulation Distance (Feet): (10 feet x2) Assistive device: Rolling walker (2 wheeled)   Gait velocity: decreased   General Gait Details: decreased B step length/foot clearance/heelstrike   Stairs             Wheelchair Mobility    Modified Rankin (Stroke Patients Only)       Balance Overall balance assessment: Needs assistance Sitting-balance support: No upper extremity supported;Feet supported Sitting balance-Leahy Scale: Good Sitting balance - Comments: steady sitting reaching within BOS   Standing balance support: Single extremity supported Standing balance-Leahy Scale: Poor Standing balance comment: requires at least single UE support for standing balance                            Cognition Arousal/Alertness: Awake/alert Behavior During Therapy: Flat affect Overall Cognitive Status: Within Functional Limits for tasks assessed  Exercises      General Comments General comments (skin integrity, edema, etc.): Pt resting in bed upon PT arrival; pt's daughter present.  Nursing cleared pt for participation in physical therapy.  Pt agreeable to PT session.      Pertinent Vitals/Pain Pain Assessment: No/denies pain  Vitals (HR and O2 on room air) stable and WFL throughout treatment session.    Home Living                      Prior Function            PT Goals (current goals can now be found in the care  plan section) Acute Rehab PT Goals Patient Stated Goal: to walk and get home soon PT Goal Formulation: With patient Time For Goal Achievement: 12/28/17 Potential to Achieve Goals: Good Progress towards PT goals: Progressing toward goals    Frequency    Min 2X/week      PT Plan Current plan remains appropriate    Co-evaluation              AM-PAC PT "6 Clicks" Daily Activity  Outcome Measure  Difficulty turning over in bed (including adjusting bedclothes, sheets and blankets)?: A Little Difficulty moving from lying on back to sitting on the side of the bed? : A Little Difficulty sitting down on and standing up from a chair with arms (e.g., wheelchair, bedside commode, etc,.)?: Unable Help needed moving to and from a bed to chair (including a wheelchair)?: A Little Help needed walking in hospital room?: A Little Help needed climbing 3-5 steps with a railing? : A Lot 6 Click Score: 15    End of Session Equipment Utilized During Treatment: Gait belt Activity Tolerance: Patient limited by fatigue Patient left: in chair;with call bell/phone within reach;with chair alarm set;with family/visitor present;Other (comment)(chair reclined and B LE's elevated via 2 pillows d/t LE swelling (per nursing request)) Nurse Communication: Mobility status;Precautions PT Visit Diagnosis: Unsteadiness on feet (R26.81);Muscle weakness (generalized) (M62.81);Adult, failure to thrive (R62.7);Other abnormalities of gait and mobility (R26.89)     Time: 1610-9604 PT Time Calculation (min) (ACUTE ONLY): 24 min  Charges:  $Therapeutic Exercise: 8-22 mins $Therapeutic Activity: 8-22 mins                    G CodesLeitha Bleak, PT 12/16/17, 12:36 PM [phone]

## 2017-12-16 NOTE — Care Management Important Message (Signed)
Copy of signed IM left in patient's room.    

## 2017-12-16 NOTE — H&P (Signed)
[facility] History & Physical Update  The patient was interviewed and re-examined.  The patient's previous History and Physical has been reviewed and is unchanged.  There is no change in the plan of care. We plan to proceed with the scheduled procedure.  Leotis Pain, MD  12/16/2017, 4:18 PM

## 2017-12-16 NOTE — Telephone Encounter (Signed)
He does not have any urologist issues as inpatient that need to be addressed at this time as far as I am aware.  If our services are needed, the attending physician will page Urology on call.    Hollice Espy, MD

## 2017-12-16 NOTE — Progress Notes (Signed)
HD tx end   12/16/17 2004  Vital Signs  Pulse Rate 68  Pulse Rate Source Monitor  Resp 20  BP (!) 95/58  BP Location Right Arm  BP Method Automatic  Patient Position (if appropriate) Lying  Oxygen Therapy  SpO2 100 %  O2 Device Room Air  During Hemodialysis Assessment  Dialysis Fluid Bolus Normal Saline  Bolus Amount (mL) 250 mL  Intra-Hemodialysis Comments Tx completed

## 2017-12-16 NOTE — Progress Notes (Signed)
Pre HD assessment    12/16/17 1739  Vital Signs  Temp (!) 96.9 F (36.1 C)  Temp Source Oral  Pulse Rate 67  Pulse Rate Source Monitor  Resp (!) 22  BP (!) 93/58  BP Location Right Arm  BP Method Automatic  Patient Position (if appropriate) Lying  Oxygen Therapy  SpO2 97 %  O2 Device Room Air  Pain Assessment  Pain Scale 0-10  Pain Score 0  Dialysis Weight  Weight 79.5 kg (175 lb 4.3 oz)  Type of Weight Pre-Dialysis  Time-Out for Hemodialysis  What Procedure? HD  Pt Identifiers(min of two) First/Last Name;MRN/Account#  Correct Site? Yes  Correct Side? Yes  Correct Procedure? Yes  Consents Verified? Yes  Rad Studies Available? N/A  Safety Precautions Reviewed? Yes  Engineer, civil (consulting) Number  ([id number])  Station Number 4  UF/Alarm Test Passed  Conductivity: Meter 13.8  Conductivity: Machine  13.6  pH 7.4  Reverse Osmosis main  Normal Saline Lot Number [id number]  Dialyzer Lot Number [id number]  Disposable Set Lot Number [id number]  Machine Temperature 98.6 F (37 C)  Musician and Audible Yes  Blood Lines Intact and Secured Yes  Pre Treatment Patient Checks  Vascular access used during treatment Catheter  Hepatitis B Surface Antigen Results Negative  Date Hepatitis B Surface Antigen Drawn 12/09/17  Hepatitis B Surface Antibody  (<10)  Date Hepatitis B Surface Antibody Drawn 12/09/17  Hemodialysis Consent Verified Yes  Hemodialysis Standing Orders Initiated Yes  ECG (Telemetry) Monitor On Yes  Prime Ordered Normal Saline  Length of  DialysisTreatment -hour(s) 2 Hour(s)  Dialyzer Elisio 17H NR  Dialysate 2K, 2.5 Ca  Dialysis Anticoagulant None  Dialysate Flow Ordered 600  Blood Flow Rate Ordered 400 mL/min  Ultrafiltration Goal 0.5 Liters  Pre Treatment Labs Phosphorus  Dialysis Blood Pressure Support Ordered Normal Saline  Education / Care Plan  Dialysis Education Provided Yes  Documented Education in Care Plan Yes

## 2017-12-16 NOTE — Progress Notes (Signed)
HD tx start    12/16/17 1749  Vital Signs  Pulse Rate 66  Pulse Rate Source Monitor  Resp 16  BP (!) 97/53  BP Location Right Arm  BP Method Automatic  Patient Position (if appropriate) Lying  Oxygen Therapy  SpO2 100 %  O2 Device Room Air  During Hemodialysis Assessment  Blood Flow Rate (mL/min) 400 mL/min  Arterial Pressure (mmHg) -130 mmHg  Venous Pressure (mmHg) 140 mmHg  Transmembrane Pressure (mmHg) 70 mmHg  Ultrafiltration Rate (mL/min) 500 mL/min  Dialysate Flow Rate (mL/min) 600 ml/min  Conductivity: Machine  13.6  HD Safety Checks Performed Yes  Dialysis Fluid Bolus Normal Saline  Bolus Amount (mL) 250 mL  Intra-Hemodialysis Comments Tx initiated

## 2017-12-16 NOTE — Progress Notes (Signed)
Post HD assessment    12/16/17 2016  Neurological  Level of Consciousness Responds to Voice  Orientation Level Oriented to person  Respiratory  Respiratory Pattern Regular;Unlabored  Chest Assessment Chest expansion symmetrical  Cardiac  ECG Monitor Yes  Vascular  R Radial Pulse +2  L Radial Pulse +2  Integumentary  Integumentary (WDL) X  Skin Color Appropriate for ethnicity  Musculoskeletal  Musculoskeletal (WDL) X  Generalized Weakness Yes  Assistive Device None  GU Assessment  Genitourinary (WDL) X  Genitourinary Symptoms  (HD)  Psychosocial  Psychosocial (WDL) WDL

## 2017-12-16 NOTE — Progress Notes (Signed)
Pre HD assessment    12/16/17 1740  Neurological  Level of Consciousness Responds to Voice  Orientation Level Oriented to person  Respiratory  Respiratory Pattern Regular;Unlabored  Chest Assessment Chest expansion symmetrical  Cardiac  ECG Monitor Yes  Vascular  R Radial Pulse +2  L Radial Pulse +2  Integumentary  Integumentary (WDL) X  Skin Color Appropriate for ethnicity  Musculoskeletal  Musculoskeletal (WDL) X  Generalized Weakness Yes  Assistive Device None  GU Assessment  Genitourinary (WDL) X  Genitourinary Symptoms  (HD)  Psychosocial  Psychosocial (WDL) WDL

## 2017-12-16 NOTE — Care Management (Signed)
Andrew [name] with [employer] familiar with patient. He will be be [name] MWF at 6:30. He can start this Wednesday. Dr. Dede Query updated. Patient will be ready for discharge tomorrow per attending.

## 2017-12-17 ENCOUNTER — Encounter: Payer: Self-pay | Admitting: Vascular Surgery

## 2017-12-17 LAB — CBC
HCT: 25.2 % — ABNORMAL LOW (ref 40.0–52.0)
HEMOGLOBIN: 7.8 g/dL — AB (ref 13.0–18.0)
MCH: 21.7 pg — ABNORMAL LOW (ref 26.0–34.0)
MCHC: 30.9 g/dL — ABNORMAL LOW (ref 32.0–36.0)
MCV: 70.5 fL — ABNORMAL LOW (ref 80.0–100.0)
PLATELETS: 191 10*3/uL (ref 150–440)
RBC: 3.58 MIL/uL — AB (ref 4.40–5.90)
RDW: 20.7 % — ABNORMAL HIGH (ref 11.5–14.5)
WBC: 12.6 10*3/uL — AB (ref 3.8–10.6)

## 2017-12-17 LAB — BASIC METABOLIC PANEL
ANION GAP: 12 (ref 5–15)
BUN: 47 mg/dL — ABNORMAL HIGH (ref 6–20)
CALCIUM: 8.3 mg/dL — AB (ref 8.9–10.3)
CO2: 29 mmol/L (ref 22–32)
CREATININE: 4.76 mg/dL — AB (ref 0.61–1.24)
Chloride: 95 mmol/L — ABNORMAL LOW (ref 101–111)
GFR calc non Af Amer: 10 mL/min — ABNORMAL LOW (ref >60)
GFR, EST AFRICAN AMERICAN: 12 mL/min — AB (ref >60)
Glucose, Bld: 66 mg/dL (ref 65–99)
Potassium: 3.8 mmol/L (ref 3.5–5.1)
SODIUM: 136 mmol/L (ref 135–145)

## 2017-12-17 MED ORDER — GUAIFENESIN ER 600 MG PO TB12: 600.0000 mg | ORAL_TABLET | Freq: Two times a day (BID) | ORAL | 0 refills | Status: DC

## 2017-12-17 MED ORDER — AMIODARONE HCL 400 MG PO TABS: 400.0000 mg | ORAL_TABLET | Freq: Two times a day (BID) | ORAL | 0 refills | Status: DC

## 2017-12-17 MED ORDER — RENA-VITE PO TABS: 1.0000 | ORAL_TABLET | Freq: Every day | ORAL | 0 refills | Status: AC

## 2017-12-17 MED ORDER — SALINE SPRAY 0.65 % NA SOLN: 1.0000 | NASAL | 0 refills | Status: AC | PRN

## 2017-12-17 MED ORDER — FLUTICASONE PROPIONATE 50 MCG/ACT NA SUSP: 2.0000 | Freq: Every day | NASAL | 0 refills | Status: AC

## 2017-12-17 MED ORDER — MIDODRINE HCL 5 MG PO TABS: 5.0000 mg | ORAL_TABLET | Freq: Three times a day (TID) | ORAL | 2 refills | Status: DC

## 2017-12-17 MED ORDER — ASPIRIN EC 81 MG PO TBEC
81.0000 mg | DELAYED_RELEASE_TABLET | Freq: Every day | ORAL | 2 refills | Status: AC
Start: 2017-12-17 — End: 2018-03-17

## 2017-12-17 MED ORDER — BENZONATATE 200 MG PO CAPS: 200.0000 mg | ORAL_CAPSULE | Freq: Three times a day (TID) | ORAL | 0 refills | Status: DC

## 2017-12-17 MED ORDER — NEPRO/CARBSTEADY PO LIQD: 237.0000 mL | Freq: Three times a day (TID) | ORAL | 0 refills | Status: AC

## 2017-12-17 MED ORDER — GUAIFENESIN-DM 100-10 MG/5ML PO SYRP
5.0000 mL | ORAL_SOLUTION | ORAL | Status: DC | PRN
  Administered 2017-12-17: 5 mL via ORAL
  Filled 2017-12-17: qty 5

## 2017-12-17 NOTE — Progress Notes (Signed)
Nutrition Follow Up Note   DOCUMENTATION CODES:   Not applicable  INTERVENTION:   Recommend appetite stimulant   Pt would likely need feeding tube placement with nutrition support to meet his estimated needs, encourage wound healing, preserve lean muscle, and support losses from HD; this may not be in line with pt's goals of care.   Nepro Shake po TID, each supplement provides 425 kcal and 19 grams protein  Magic cup TID with meals, each supplement provides 290 kcal and 9 grams of protein  Liberalize diet  Rena-vite daily   Ocuvite daily for wound healing (provides zinc, vitamin A, vitamin C, Vitamin E, copper, and selenium)  NUTRITION DIAGNOSIS:   Increased nutrient needs related to wound healing, chronic illness(CHF, prostate cancer ) as evidenced by increased estimated needs from protein.   GOAL:   Patient will meet greater than or equal to 90% of their needs  -not met  MONITOR:   PO intake, Supplement acceptance, Labs, Weight trends, I & O's, Skin  ASSESSMENT:   84 y.o. male with a known history of multiple medical problems including CAD, hypertension, hyperlipidemia, CKD, prostate cancer and a lung nodule.  The patient came to ED due to dizziness and leg swelling for 1 week.  He is found acute on chronic renal failure    Pt with continued poor appetite and oral intake; eating <25% of meals and drinking some nepro. Pt would likely need feeding tube placement with nutrition support to meet his estimated needs, encourage wound healing, preserve lean muscle and support losses from HD; this may not be in line with pt's goals of care. Per chart, pt wanting to discuss comfort care. Pt with weight gain since admit. Pt s/p HD initiation 4/29. Pt s/p tunneled HD catheter placement 5/6. RD will liberalize diet as pt is not eating enough to surpass potassium and phosphorus restrictions. Continue supplements. RD will monitor for GOC.    Medications reviewed and include: aspirin,  protonix, rena-vite, ocuvite, KCl   Labs reviewed: K 3.8 wnl, Cl 95(L), BUN 47(H), creat 4.76(H), Ca 8.3(L) P 5.1(H)- 5/6 Wbc- 12.6(H), Hgb 7.8(L), Hct 25.2(L)  Diet Order:   Diet Order           Diet - low sodium heart healthy        Diet renal with fluid restriction Fluid restriction: 1200 mL Fluid; Room service appropriate? Yes; Fluid consistency: Thin  Diet effective now         EDUCATION NEEDS:   No education needs have been identified at this time  Skin:  Skin Assessment: (stage II to coccyx)  Last BM:  5/5- type 4  Height:   Ht Readings from Last 1 Encounters:  12/09/17 _0  (1.753 m)    Weight:   Wt Readings from Last 1 Encounters:  12/16/17 171 lb 15.3 oz (78 kg)    Ideal Body Weight:  72.7 kg  BMI:  Body mass index is 25.39 kg/m.  Estimated Nutritional Needs:   Kcal:  1700-2000kcal/day   Protein:  89-104g/day   Fluid:  >1.7L/day or per MD  Koleen Distance MS, RD, LDN Pager #[phone] After Hours Pager: [phone]

## 2017-12-17 NOTE — Discharge Summary (Addendum)
[facility] NAME: Andrew [name]    MR#:  [id number]  DATE OF BIRTH:  Apr 24, 1933  DATE OF ADMISSION:  12/01/2017   ADMITTING PHYSICIAN: Demetrios Loll, MD  DATE OF DISCHARGE: 12/17/17  PRIMARY CARE PHYSICIAN: [name], MD   ADMISSION DIAGNOSIS:   Peripheral edema [R60.9] AKI (acute kidney injury) ([place]) [N17.9]  DISCHARGE DIAGNOSIS:   Active Problems:   Renal failure (ARF), acute on chronic (HCC)   Pressure injury of skin   SECONDARY DIAGNOSIS:   Past Medical History:  Diagnosis Date  . Arteriosclerosis of coronary artery 01/27/2014  . Benign essential HTN 12/11/2013  . Carpal tunnel syndrome   . Coronary atherosclerosis of native coronary artery   . Disorder of mitral valve 12/11/2013  . Encounter for long-term (current) use of antiplatelets/antithrombotics   . Gout   . Hyperlipidemia   . Hypertension   . Lung nodule    found on xray in Aug 2018  . Mitral valve disorder   . Osteoarthritis   . Prostate cancer [facility])     HOSPITAL COURSE:   84 y.o.malewith a known history of multiple medical problemsincluding CAD, hypertension, hyperlipidemia, CKD, prostate cancer and a lung nodule came in secondary to worsening lower extremity edema and dizziness.  * Acute renal failure on CKD stage3.  -Could be secondary to ATN.  Creatinine worsened up to 6.3, -Started on hemodialysis here on 12/09/17 -With minimal urine output and significant anasarca -Temporary dialysis catheter removed.  right chest permacath placed  - outpatient dialysis arranged-Monday, Wednesday and Friday dialysis schedule set up -Last dialysis on 12/16/2017 -Appreciate nephrology consult  * AF with RVR and multiple runs of VT---pt is now in NSR -has several PVCs.  Appreciate cardiology input.   -On high dose of oral amiodarone.  Taper to once a day as outpatient within 1 week and cardiology follow-up after that -Eliquis was started at a low dose,  however held due to melena and anemia -on aspirin  * CAD.  -Continue aspirin and atorvastatin.  * Hyperlipidemia. -Continue atorvastatin.  *Anemia of chronic disease -hgb at 7.9 -No indication for transfusion yet unless less than 7.  Monitor hemoglobin  * acute on chronic systolic & diastolic CHF: -Anasarca, lower extremity edema. Unable to pull off fluid during dialysis due to low BP - Monitor  * Elevated PSA-patient was followed by urology in 2016, PSA was 3 back then and prostate biopsy showed low risk adenocarcinoma at the time.  He was advised radiation therapy and outpatient follow-up which he did not follow back then. -PSA elevated to 11. -Discussed with daughter, he needs to follow-up as outpatient   Physical therapy consulted-family wants to take patient home. Home health will be arranged at discharge    DISCHARGE CONDITIONS:   Guarded  CONSULTS OBTAINED:   Treatment Team:  Anthonette Legato, MD Troxler, Rodman Key, DPM Emmaline Kluver., MD Algernon Huxley, MD Teodoro Spray, MD  DRUG ALLERGIES:   No Known Allergies DISCHARGE MEDICATIONS:   Allergies as of 12/17/2017   No Known Allergies     Medication List    STOP taking these medications   aspirin 325 MG tablet Replaced by:  aspirin EC 81 MG tablet   loratadine 10 MG tablet Commonly known as:  CLARITIN   torsemide 20 MG tablet Commonly known as:  DEMADEX     TAKE these medications   amiodarone 400 MG tablet Commonly known as:  PACERONE Take  1 tablet (400 mg total) by mouth 2 (two) times daily. Take 400mg  oral twice a day for 1 week and then change to once a day after that   aspirin EC 81 MG tablet Take 1 tablet (81 mg total) by mouth daily. Replaces:  aspirin 325 MG tablet   atorvastatin 40 MG tablet Commonly known as:  LIPITOR Take 40 mg by mouth daily.   benzonatate 200 MG capsule Commonly known as:  TESSALON Take 1 capsule (200 mg total) by mouth 3 (three) times daily.     feeding supplement (NEPRO CARB STEADY) Liqd Take 237 mLs by mouth 3 (three) times daily between meals.   finasteride 5 MG tablet Commonly known as:  PROSCAR Take 5 mg by mouth daily.   fluticasone 50 MCG/ACT nasal spray Commonly known as:  FLONASE Place 2 sprays into both nostrils daily. Start taking on:  12/18/2017   guaiFENesin 600 MG 12 hr tablet Commonly known as:  MUCINEX Take 1 tablet (600 mg total) by mouth 2 (two) times daily.   midodrine 5 MG tablet Commonly known as:  PROAMATINE Take 1 tablet (5 mg total) by mouth 3 (three) times daily with meals.   multivitamin Tabs tablet Take 1 tablet by mouth at bedtime.   omeprazole 20 MG capsule Commonly known as:  PRILOSEC Take 1 capsule (20 mg total) by mouth 2 (two) times daily.   sodium chloride 0.65 % Soln nasal spray Commonly known as:  OCEAN Place 1 spray into both nostrils as needed for congestion.            Durable Medical Equipment  (From admission, onward)        Start     Ordered   12/16/17 1543  For home use only DME Walker rolling  Once    Question:  Patient needs a walker to treat with the following condition  Answer:  Weakness   12/16/17 1542       DISCHARGE INSTRUCTIONS:   1. PCP f/u in 1-2 weeks 2. For dialysis tomorrow per schedule 3.  Cardiology follow-up in 2 weeks 3.  Urology follow-up in 2 to 3 weeks  DIET:   Renal diet  ACTIVITY:   Activity as tolerated  OXYGEN:   Home Oxygen: No.  Oxygen Delivery: room air  DISCHARGE LOCATION:   home   If you experience worsening of your admission symptoms, develop shortness of breath, life threatening emergency, suicidal or homicidal thoughts you must seek medical attention immediately by calling 911 or calling your MD immediately  if symptoms less severe.  You Must read complete instructions/literature along with all the possible adverse reactions/side effects for all the Medicines you take and that have been prescribed to you. Take  any new Medicines after you have completely understood and accpet all the possible adverse reactions/side effects.   Please note  You were cared for by a hospitalist during your hospital stay. If you have any questions about your discharge medications or the care you received while you were in the hospital after you are discharged, you can call the unit and asked to speak with the hospitalist on call if the hospitalist that took care of you is not available. Once you are discharged, your primary care physician will handle any further medical issues. Please note that NO REFILLS for any discharge medications will be authorized once you are discharged, as it is imperative that you return to your primary care physician (or establish a relationship with a primary care physician  if you do not have one) for your aftercare needs so that they can reassess your need for medications and monitor your lab values.    On the day of Discharge:  VITAL SIGNS:   Blood pressure (!) 94/54, pulse 66, temperature 97.8 F (36.6 C), temperature source Oral, resp. rate 18, height 5\' 9"  (1.753 m), weight 78 kg (171 lb 15.3 oz), SpO2 96 %.  PHYSICAL EXAMINATION:    GENERAL:  84 y.o.-year-old patient lying in the bed with no acute distress.  EYES: Pupils equal, round, reactive to light and accommodation. No scleral icterus. Extraocular muscles intact.  HEENT: Head atraumatic, normocephalic. Oropharynx and nasopharynx clear.  NECK:  Supple, no jugular venous distention. No thyroid enlargement, no tenderness.  LUNGS: Normal breath sounds bilaterally, no wheezing, rales,rhonchi or crepitation. No use of accessory muscles of respiration.  Decreased bibasilar breath sounds noted CARDIOVASCULAR: S1, S2 normal. No rubs, or gallops.  2/6 systolic murmur is present -Right chest permacath present ABDOMEN: Soft, nontender, nondistended. Bowel sounds present. No organomegaly or mass.  EXTREMITIES: 3+ pedal edema noted. No   cyanosis, or clubbing. Right groin catheter removed, no bleeding NEUROLOGIC: Cranial nerves II through XII are intact. Muscle strength 5/5 in all extremities. Sensation intact. Gait not checked. Global weakness PSYCHIATRIC: The patient is alert and oriented x 3.  SKIN: No obvious rash, lesion, or ulcer.    DATA REVIEW:   CBC Recent Labs  Lab 12/17/17 0718  WBC 12.6*  HGB 7.8*  HCT 25.2*  PLT 191    Chemistries  Recent Labs  Lab 12/15/17 1453  12/17/17 0718  NA  --    < > 136  K  --    < > 3.8  CL  --    < > 95*  CO2  --    < > 29  GLUCOSE  --    < > 66  BUN  --    < > 47*  CREATININE  --    < > 4.76*  CALCIUM  --    < > 8.3*  MG 2.1  --   --    < > = values in this interval not displayed.     Microbiology Results  Results for orders placed or performed during the hospital encounter of 12/01/17  MRSA PCR Screening     Status: None   Collection Time: 12/05/17  8:00 AM  Result Value Ref Range Status   MRSA by PCR NEGATIVE NEGATIVE Final    Comment:        The GeneXpert MRSA Assay (FDA approved for NASAL specimens only), is one component of a comprehensive MRSA colonization surveillance program. It is not intended to diagnose MRSA infection nor to guide or monitor treatment for MRSA infections. Performed at [facility], [address] [place], [place] [address]     RADIOLOGY:  No results found.   Management plans discussed with the patient, family and they are in agreement.  CODE STATUS:     Code Status Orders  (From admission, onward)        Start     Ordered   12/02/17 1128  Do not attempt resuscitation (DNR)  Continuous    Question Answer Comment  In the event of cardiac or respiratory ARREST Do not call a "code blue"   In the event of cardiac or respiratory ARREST Do not perform Intubation, CPR, defibrillation or ACLS   In the event of cardiac or respiratory ARREST Use medication by  any route, position, wound care, and other  measures to relive pain and suffering. May use oxygen, suction and manual treatment of airway obstruction as needed for comfort.      12/02/17 1128    Code Status History    Date Active Date Inactive Code Status Order ID Comments User Context   12/01/2017 1522 12/02/2017 1128 Full Code [id number]  Demetrios Loll, MD Inpatient   10/30/2017 2256 11/02/2017 1709 Full Code [id number]  Lance Coon, MD Inpatient   07/26/2017 1728 07/28/2017 1620 Full Code [id number]  Saundra Shelling, MD Inpatient   04/08/2017 1555 04/10/2017 1526 Full Code [id number]  Dustin Flock, MD Inpatient    [facility] Directive Documentation     Most Recent Value  Type of [facility] Directive  Living will  Pre-existing out of facility DNR order (yellow form or pink MOST form)  -  "MOST" Form in Place?  -      TOTAL TIME TAKING CARE OF THIS PATIENT: 38 minutes.    Gladstone Lighter M.D on 12/17/2017 at 10:36 AM  Between 7am to 6pm - Pager - [phone]  After 6pm go to [email or web address] - Proofreader  [employer]  Hospitalists  Office  [phone]  CC: Primary care physician; [name], MD   Note: This dictation was prepared with Dragon dictation along with smaller phrase technology. Any transcriptional errors that result from this process are unintentional.

## 2017-12-17 NOTE — Progress Notes (Signed)
Pt discharged home today with daughter, walker has been delivered by [facility], pt VSS at this time, no complaints, IV removed, and pt and family/caregiver educated on medications, and regimen as well as scheduled follow up appointments, pt will be taken to dialysis tomorrow morning.

## 2017-12-17 NOTE — Plan of Care (Signed)
  Problem: Clinical Measurements: Goal: Dialysis access will remain free of complications Outcome: Adequate for Discharge   Problem: Elimination: Goal: Will not experience complications related to bowel motility Outcome: Adequate for Discharge

## 2017-12-17 NOTE — Progress Notes (Signed)
NT unable to document daily weight due to 40 lb difference from yesterday's weight. Will pass on to oncoming shift. Earleen Reaper, RN

## 2017-12-17 NOTE — Care Management Note (Signed)
Case Management Note  Patient Details  Name: Andrew [name] MRN: [id number] Date of Birth: 1933/05/27  Subjective/Objective:  Discharging today                  Action/Plan: [facility] notified of discharge.Jason with [facility] to deliver walker. Spoke with daughter, patient will discharge home by car.   Expected Discharge Date:  12/17/17               Expected Discharge Plan:  [place]  In-House Referral:     Discharge planning Services  CM Consult  Post Acute Care Choice:  Durable Medical Equipment, Home Health Choice offered to:  Adult Children  DME Arranged:  Walker rolling DME Agency:  [place] Arranged:  RN, PT, OT, Nurse's Aide, Social Work [employer] Agency:  [facility]  Status of Service:  Completed, signed off  If discussed at [employer], dates discussed:    Additional Comments:  Jolly Mango, RN 12/17/2017, 11:02 AM

## 2017-12-17 NOTE — Progress Notes (Signed)
[facility]  ROUNDING NOTE   Subjective:  Late entry, patient was seen prior to discharge at bedside.  Patient underwent hemodialysis yesterday. Right internal jugular PermCath was placed yesterday.    Objective:  Vital signs in last 24 hours:  Temp:  [96.9 F (36.1 C)-97.9 F (36.6 C)] 97.8 F (36.6 C) (05/07 0825) Pulse Rate:  [64-72] 66 (05/07 0825) Resp:  [10-22] 18 (05/07 0825) BP: (90-103)/(53-62) 94/54 (05/07 0825) SpO2:  [90 %-100 %] 96 % (05/07 0825) Weight:  [78 kg (171 lb 15.3 oz)-79.5 kg (175 lb 4.3 oz)] 78 kg (171 lb 15.3 oz) (05/06 2010)  Weight change: 1.799 kg (3 lb 15.5 oz) Filed Weights   12/16/17 0416 12/16/17 1739 12/16/17 2010  Weight: 77.7 kg (171 lb 4.8 oz) 79.5 kg (175 lb 4.3 oz) 78 kg (171 lb 15.3 oz)    Intake/Output: I/O last 3 completed shifts: In: -  Out: 561 [Other:561]   Intake/Output this shift:  No intake/output data recorded.  Physical Exam: General: NAD, laying in bed  Head: Normocephalic, atraumatic. Moist oral mucosal membranes  Eyes: Anicteric,   Neck: Supple, trachea midline  Lungs:  Clear to auscultation  Heart: No rub S1S2  Abdomen:  Soft, nontender, BS present  Extremities:  3+ peripheral edema.   Neurologic: Nonfocal, moving all four extremities  Skin: Warm, dry, no rash  Access:  Right internal jugular permcath    Basic Metabolic Panel: Recent Labs  Lab 12/11/17 0822 12/13/17 1416 12/14/17 1034 12/15/17 1453 12/16/17 0524 12/16/17 1516 12/17/17 0718  NA 134*  --  135  --  134*  --  136  K 3.4*  --  3.6  --  3.9  --  3.8  CL 96*  --  95*  --  94*  --  95*  CO2 26  --  27  --  26  --  29  GLUCOSE 109*  --  168*  --  94  --  66  BUN 78*  --  46*  --  64*  --  47*  CREATININE 4.97*  --  4.15*  --  5.90*  --  4.76*  CALCIUM 8.3*  --  8.6*  --  8.5*  --  8.3*  MG  --  2.2  --  2.1  --   --   --   PHOS 4.6  --   --   --   --  5.1*  --     Liver Function Tests: Recent Labs  Lab 12/11/17 0822   ALBUMIN 3.8   No results for input(s): LIPASE, AMYLASE in the last 168 hours. No results for input(s): AMMONIA in the last 168 hours.  CBC: Recent Labs  Lab 12/14/17 1034 12/17/17 0718  WBC 9.9 12.6*  HGB 7.9* 7.8*  HCT 25.5* 25.2*  MCV 70.4* 70.5*  PLT 205 191    Cardiac Enzymes: No results for input(s): CKTOTAL, CKMB, CKMBINDEX, TROPONINI in the last 168 hours.  BNP: Invalid input(s): POCBNP  CBG: No results for input(s): GLUCAP in the last 168 hours.  Microbiology: Results for orders placed or performed during the hospital encounter of 12/01/17  MRSA PCR Screening     Status: None   Collection Time: 12/05/17  8:00 AM  Result Value Ref Range Status   MRSA by PCR NEGATIVE NEGATIVE Final    Comment:        The GeneXpert MRSA Assay (FDA approved for NASAL specimens only), is one component of a comprehensive  MRSA colonization surveillance program. It is not intended to diagnose MRSA infection nor to guide or monitor treatment for MRSA infections. Performed at [facility], [address] [place], [place] [address]     Coagulation Studies: No results for input(s): LABPROT, INR in the last 72 hours.  Urinalysis: No results for input(s): COLORURINE, LABSPEC, PHURINE, GLUCOSEU, HGBUR, BILIRUBINUR, KETONESUR, PROTEINUR, UROBILINOGEN, NITRITE, LEUKOCYTESUR in the last 72 hours.  Invalid input(s): APPERANCEUR    Imaging: No results found.   Medications:    . amiodarone  400 mg Oral BID  . aspirin  325 mg Oral Daily  . atorvastatin  40 mg Oral Daily  . benzonatate  200 mg Oral TID  . guaiFENesin  600 mg Oral BID   And  . dextromethorphan  30 mg Oral BID  . feeding supplement (NEPRO CARB STEADY)  237 mL Oral TID BM  . finasteride  5 mg Oral Daily  . fluticasone  2 spray Each Nare Daily  . midodrine  5 mg Oral TID WC  . multivitamin  1 tablet Oral QHS  . multivitamin-lutein  1 capsule Oral Daily  . pantoprazole  40 mg Oral Daily  .  potassium chloride  40 mEq Oral Once   acetaminophen **OR** acetaminophen, albuterol, bisacodyl, guaiFENesin-dextromethorphan, HYDROcodone-acetaminophen, menthol-cetylpyridinium, metoprolol tartrate, ondansetron **OR** ondansetron (ZOFRAN) IV, phenazopyridine, senna-docusate, silver nitrate applicators, sodium chloride  Assessment/ Plan:  [name] is a 84 y.o. black male with hypertension, hyperlipidemia, gout, ? history of prostate cancer, coronary artery disease, who was admitted to [facility] on 12/01/2017    1. Acute renal failure with hyperkalemia, hyponatremia and metabolic acidosis on chronic kidney disease stage III with bland urinalysis. Baseline creatinine of 2.1, GFR of 37 on 10/15/17.  Acute renal failure secondary to acute cardiorenal syndrome, ATN from hypotensive event and then with overdiuresis -overall renal function remains quite diminished.  Most recent EGFR was 12.  Patient has been admitted for outpatient dialysis at [facility], we have prepared orders.   2. Hyperkalemia now hypokalemia Corrected with HD   3. Gout: with hyperuricemia.  - Appreciate rheumatology, podiatry input  4. LE edema, CAD, A Fib, Severe MR, pulmonary hypertension - likely cause of his severe lower extremity edema -Will continue UF with outpt HD.   5.  H/o prostate cancer 2016-2017 Adenocarcinoma- Seen by Dr Erlene Quan in 07/2015- missed f/u since then Treated with Finasteride PSA is elevated.  Patient will need continued follow-up with urology.  6. Cardiac- CAD with DES, A Fib, Ch Sys CHF 40-45% Seen by Dr Saralyn Pilar this admission Volume removal with dialysis as tolerated Not able to use ACE inhibitor due to low blood pressure  7.  Anemia chronic kidney disease.  Hemoglobin currently 7.8.  We may need to consider use of Epogen as an outpatient.    LOS: [place] 5/7/20193:11 PM

## 2017-12-18 NOTE — Progress Notes (Signed)
Patient ID: Andrew [name], male    DOB: Oct 18, 1932, 84 y.o.   MRN: [id number]  HPI  Mr Wease is a 84 y/o male with a history of CAD, hyperlipidemia, HTN, CKD (ESRD), atrial fibrillation, gout and chronic heart failure.   Echo report from 12/02/17 reviewed and showed an EF of 45-50% along with mild MS, moderate MR/TR and severely PA pressure of 65 mm Hg.   Admitted 12/01/17 due to acute renal failure along with atrial fibrillation with RVR. Cardiology, nephrology and vascular consults obtained. Dialysis started. Medications adjusted and he was discharged home with home health after 16 days.   He presents today for his initial visit with a chief complaint of moderate fatigue upon minimal exertion. He says this has been present for several months. He has associated shortness of breath, pedal edema, discharge from left eye and beginning of a bedsore. He denies any difficulty sleeping, abdominal distention, palpitations, chest pain, dizziness or weight gain. Has been going to dialysis for ~ the last week and says that he's still adjusting to "everything".   Past Medical History:  Diagnosis Date  . Arrhythmia    atrial fibrillation  . Arteriosclerosis of coronary artery 01/27/2014  . Benign essential HTN 12/11/2013  . Carpal tunnel syndrome   . Chronic kidney disease   . Coronary atherosclerosis of native coronary artery   . Disorder of mitral valve 12/11/2013  . Encounter for long-term (current) use of antiplatelets/antithrombotics   . Gout   . Hyperlipidemia   . Hypertension   . Lung nodule    found on xray in Aug 2018  . Mitral valve disorder   . Osteoarthritis   . Prostate cancer [facility])    Past Surgical History:  Procedure Laterality Date  . COLONOSCOPY    . COLONOSCOPY WITH PROPOFOL N/A 07/23/2016   Procedure: COLONOSCOPY WITH PROPOFOL;  Surgeon: Manya Silvas, MD;  Location: [facility] ENDOSCOPY;  Service: Endoscopy;  Laterality: N/A;  . CORONARY ANGIOPLASTY WITH STENT PLACEMENT    .  DIALYSIS/PERMA CATHETER INSERTION N/A 12/09/2017   Procedure: DIALYSIS/PERMA CATHETER INSERTION;  Surgeon: Algernon Huxley, MD;  Location: [place] CV LAB;  Service: Cardiovascular;  Laterality: N/A;  . DIALYSIS/PERMA CATHETER INSERTION N/A 12/16/2017   Procedure: DIALYSIS/PERMA CATHETER INSERTION;  Surgeon: Algernon Huxley, MD;  Location: [place] CV LAB;  Service: Cardiovascular;  Laterality: N/A;  . ESOPHAGOGASTRODUODENOSCOPY Left 07/28/2017   Procedure: ESOPHAGOGASTRODUODENOSCOPY (EGD);  Surgeon: Virgel Manifold, MD;  Location: [facility] ENDOSCOPY;  Service: Endoscopy;  Laterality: Left;  . ESOPHAGOGASTRODUODENOSCOPY (EGD) WITH PROPOFOL N/A 07/23/2016   Procedure: ESOPHAGOGASTRODUODENOSCOPY (EGD) WITH PROPOFOL;  Surgeon: Manya Silvas, MD;  Location: [facility] ENDOSCOPY;  Service: Endoscopy;  Laterality: N/A;  . PROSTATE BIOPSY     Family History  Problem Relation Age of Onset  . Cancer Mother        stomach and uterus  . Cancer Sister        breast   Social History   Tobacco Use  . Smoking status: Never Smoker  . Smokeless tobacco: Never Used  Substance Use Topics  . Alcohol use: No   No Known Allergies Prior to Admission medications   Medication Sig Start Date End Date Taking? Authorizing Provider  amiodarone (PACERONE) 400 MG tablet Take 1 tablet (400 mg total) by mouth 2 (two) times daily. Take 400mg  oral twice a day for 1 week and then change to once a day after that 12/17/17  Yes Gladstone Lighter, MD  aspirin EC  81 MG tablet Take 1 tablet (81 mg total) by mouth daily. 12/17/17 03/17/18 Yes Gladstone Lighter, MD  atorvastatin (LIPITOR) 40 MG tablet Take 40 mg by mouth daily. 04/24/15  Yes [provider]  benzonatate (TESSALON) 200 MG capsule Take 1 capsule (200 mg total) by mouth 3 (three) times daily. 12/17/17  Yes Gladstone Lighter, MD  finasteride (PROSCAR) 5 MG tablet Take 5 mg by mouth daily. 04/13/15  Yes [provider]  fluticasone (FLONASE) 50 MCG/ACT  nasal spray Place 2 sprays into both nostrils daily. 12/18/17  Yes Gladstone Lighter, MD  guaiFENesin (MUCINEX) 600 MG 12 hr tablet Take 1 tablet (600 mg total) by mouth 2 (two) times daily. 12/17/17  Yes Gladstone Lighter, MD  midodrine (PROAMATINE) 5 MG tablet Take 1 tablet (5 mg total) by mouth 3 (three) times daily with meals. 12/17/17  Yes Gladstone Lighter, MD  multivitamin (RENA-VIT) TABS tablet Take 1 tablet by mouth at bedtime. 12/17/17  Yes Gladstone Lighter, MD  Nutritional Supplements (FEEDING SUPPLEMENT, NEPRO CARB STEADY,) LIQD Take 237 mLs by mouth 3 (three) times daily between meals. 12/17/17 01/16/18 Yes Gladstone Lighter, MD  omeprazole (PRILOSEC) 20 MG capsule Take 1 capsule (20 mg total) by mouth 2 (two) times daily. 07/28/17 07/28/18 Yes Mody, Ulice Bold, MD  sodium chloride (OCEAN) 0.65 % SOLN nasal spray Place 1 spray into both nostrils as needed for congestion. 12/17/17  Yes Gladstone Lighter, MD    Review of Systems  Constitutional: Positive for fatigue. Negative for appetite change.  HENT: Negative for congestion, postnasal drip and sore throat.   Eyes: Positive for discharge (left eye).  Respiratory: Positive for shortness of breath. Negative for chest tightness.   Cardiovascular: Positive for leg swelling. Negative for chest pain and palpitations.  Gastrointestinal: Negative for abdominal distention and abdominal pain.  Endocrine: Negative.   Musculoskeletal: Negative for back pain and neck pain.  Skin: Positive for wound (bedsore on "backside").  Allergic/Immunologic: Negative.   Neurological: Negative for dizziness and light-headedness.  Hematological: Negative for adenopathy. Does not bruise/bleed easily.  Psychiatric/Behavioral: Negative for dysphoric mood and sleep disturbance (sleeping on 2 pillows). The patient is not nervous/anxious.     Vitals:   12/19/17 1127  BP: (!) 91/53  Pulse: 79  Resp: 18  SpO2: 98%  Weight: 160 lb (72.6 kg)  Height: 5\' 9"  (1.753 m)    Wt Readings from Last 3 Encounters:  12/19/17 160 lb (72.6 kg)  12/16/17 171 lb 15.3 oz (78 kg)  11/02/17 159 lb 9.6 oz (72.4 kg)   Lab Results  Component Value Date   CREATININE 4.76 (H) 12/17/2017   CREATININE 5.90 (H) 12/16/2017   CREATININE 4.15 (H) 12/14/2017    Physical Exam  Constitutional: He is oriented to person, place, and time. He appears well-developed and well-nourished.  HENT:  Head: Normocephalic and atraumatic.  Neck: Normal range of motion. Neck supple. No JVD present.  Cardiovascular: Normal rate and regular rhythm.  Pulmonary/Chest: Effort normal. No respiratory distress. He has no wheezes. He has no rales.  Abdominal: Soft. He exhibits no distension.  Musculoskeletal: He exhibits edema (2+ pitting edema in bilateral lower legs). He exhibits no tenderness.  Neurological: He is alert and oriented to person, place, and time.  Skin: Skin is warm and dry.  Psychiatric: He has a normal mood and affect. His behavior is normal. Thought content normal.  Nursing note and vitals reviewed.   Assessment & Plan:  1: Chronic heart failure with reduced ejection fraction- - NYHA  class III - moderately fluid overloaded with pedal edema - not weighing daily although he does have scales. Instructed him to start weighing every day and calling for an overnight weight gain of >2 pounds or a weekly weight gain of >5 pounds - did encourage him and his daughter to ask nephrologist if he needs diuretic or not - not adding salt and says that he's trying to adjust to his diet - daughter says nephrology has patient on 32 ounce daily fluid restriction - encouraged him to elevate his legs when sitting for long periods of time - saw cardiology (Fath) 11/28/17 - BNP 12/01/17 was 3281.0  2: HTN- - BP on the low side today - saw PCP Kary Kos) 10/15/17 - BMP from 12/17/17 reviewed and showed sodium 136, potassium 3.8 and GFR 12  3: ESRD- - dialysis M, W, F - patient says that he doesn't  make urine - sees urology 12/31/17  4: Atrial fibrillation- - rate controlled at this time  Medication list was reviewed.  Advised daughter to follow-up with PCP regarding eye discharge and sore on his backside.   Return in 6 weeks or sooner for any questions/problems before then.

## 2017-12-19 ENCOUNTER — Encounter: Payer: Self-pay | Admitting: Family

## 2017-12-19 ENCOUNTER — Ambulatory Visit: Payer: Medicare Other | Admitting: Family

## 2017-12-19 VITALS — BP 91/53 | HR 79 | Resp 18 | Ht 69.0 in | Wt 160.0 lb

## 2017-12-19 DIAGNOSIS — Z86010 Personal history of colonic polyps: Secondary | ICD-10-CM | POA: Diagnosis not present

## 2017-12-19 DIAGNOSIS — M1990 Unspecified osteoarthritis, unspecified site: Secondary | ICD-10-CM | POA: Diagnosis not present

## 2017-12-19 DIAGNOSIS — I248 Other forms of acute ischemic heart disease: Principal | ICD-10-CM | POA: Diagnosis present

## 2017-12-19 DIAGNOSIS — Z992 Dependence on renal dialysis: Secondary | ICD-10-CM | POA: Diagnosis not present

## 2017-12-19 DIAGNOSIS — I5043 Acute on chronic combined systolic (congestive) and diastolic (congestive) heart failure: Secondary | ICD-10-CM | POA: Diagnosis present

## 2017-12-19 DIAGNOSIS — E785 Hyperlipidemia, unspecified: Secondary | ICD-10-CM | POA: Insufficient documentation

## 2017-12-19 DIAGNOSIS — I340 Nonrheumatic mitral (valve) insufficiency: Secondary | ICD-10-CM | POA: Diagnosis present

## 2017-12-19 DIAGNOSIS — Z9889 Other specified postprocedural states: Secondary | ICD-10-CM

## 2017-12-19 DIAGNOSIS — K219 Gastro-esophageal reflux disease without esophagitis: Secondary | ICD-10-CM | POA: Diagnosis present

## 2017-12-19 DIAGNOSIS — Z7982 Long term (current) use of aspirin: Secondary | ICD-10-CM

## 2017-12-19 DIAGNOSIS — R972 Elevated prostate specific antigen [PSA]: Secondary | ICD-10-CM | POA: Diagnosis present

## 2017-12-19 DIAGNOSIS — Z66 Do not resuscitate: Secondary | ICD-10-CM | POA: Diagnosis present

## 2017-12-19 DIAGNOSIS — E782 Mixed hyperlipidemia: Secondary | ICD-10-CM | POA: Diagnosis not present

## 2017-12-19 DIAGNOSIS — Z8049 Family history of malignant neoplasm of other genital organs: Secondary | ICD-10-CM

## 2017-12-19 DIAGNOSIS — I480 Paroxysmal atrial fibrillation: Secondary | ICD-10-CM

## 2017-12-19 DIAGNOSIS — M109 Gout, unspecified: Secondary | ICD-10-CM | POA: Diagnosis present

## 2017-12-19 DIAGNOSIS — Z800 Family history of malignant neoplasm of digestive organs: Secondary | ICD-10-CM

## 2017-12-19 DIAGNOSIS — R011 Cardiac murmur, unspecified: Secondary | ICD-10-CM | POA: Diagnosis present

## 2017-12-19 DIAGNOSIS — Z8546 Personal history of malignant neoplasm of prostate: Secondary | ICD-10-CM

## 2017-12-19 DIAGNOSIS — Z79899 Other long term (current) drug therapy: Secondary | ICD-10-CM

## 2017-12-19 DIAGNOSIS — Z803 Family history of malignant neoplasm of breast: Secondary | ICD-10-CM

## 2017-12-19 DIAGNOSIS — R0602 Shortness of breath: Secondary | ICD-10-CM | POA: Insufficient documentation

## 2017-12-19 DIAGNOSIS — I132 Hypertensive heart and chronic kidney disease with heart failure and with stage 5 chronic kidney disease, or end stage renal disease: Secondary | ICD-10-CM

## 2017-12-19 DIAGNOSIS — Y92230 Patient room in hospital as the place of occurrence of the external cause: Secondary | ICD-10-CM | POA: Diagnosis not present

## 2017-12-19 DIAGNOSIS — I2510 Atherosclerotic heart disease of native coronary artery without angina pectoris: Secondary | ICD-10-CM | POA: Diagnosis present

## 2017-12-19 DIAGNOSIS — N170 Acute kidney failure with tubular necrosis: Secondary | ICD-10-CM | POA: Diagnosis present

## 2017-12-19 DIAGNOSIS — I4891 Unspecified atrial fibrillation: Secondary | ICD-10-CM | POA: Insufficient documentation

## 2017-12-19 DIAGNOSIS — I953 Hypotension of hemodialysis: Secondary | ICD-10-CM | POA: Diagnosis present

## 2017-12-19 DIAGNOSIS — T502X5A Adverse effect of carbonic-anhydrase inhibitors, benzothiadiazides and other diuretics, initial encounter: Secondary | ICD-10-CM | POA: Diagnosis not present

## 2017-12-19 DIAGNOSIS — I5022 Chronic systolic (congestive) heart failure: Secondary | ICD-10-CM | POA: Insufficient documentation

## 2017-12-19 DIAGNOSIS — D631 Anemia in chronic kidney disease: Secondary | ICD-10-CM | POA: Diagnosis not present

## 2017-12-19 DIAGNOSIS — N186 End stage renal disease: Secondary | ICD-10-CM | POA: Diagnosis present

## 2017-12-19 DIAGNOSIS — I272 Pulmonary hypertension, unspecified: Secondary | ICD-10-CM | POA: Diagnosis present

## 2017-12-19 DIAGNOSIS — R748 Abnormal levels of other serum enzymes: Secondary | ICD-10-CM | POA: Diagnosis present

## 2017-12-19 DIAGNOSIS — I10 Essential (primary) hypertension: Secondary | ICD-10-CM

## 2017-12-19 DIAGNOSIS — Z955 Presence of coronary angioplasty implant and graft: Secondary | ICD-10-CM

## 2017-12-19 NOTE — Patient Instructions (Addendum)
Begin weighing daily and call for an overnight weight gain of > 2 pounds or a weekly weight gain of >5 pounds. 

## 2017-12-20 ENCOUNTER — Ambulatory Visit: Payer: Medicare Other | Admitting: Family

## 2017-12-20 ENCOUNTER — Inpatient Hospital Stay
Admission: EM | Admit: 2017-12-20 | Discharge: 2017-12-21 | DRG: 311 | Disposition: A | Discharge status: Home Health | Payer: Medicare Other | Attending: Internal Medicine | Admitting: Internal Medicine

## 2017-12-20 ENCOUNTER — Other Ambulatory Visit: Payer: Self-pay

## 2017-12-20 ENCOUNTER — Emergency Department: Payer: Medicare Other

## 2017-12-20 DIAGNOSIS — I132 Hypertensive heart and chronic kidney disease with heart failure and with stage 5 chronic kidney disease, or end stage renal disease: Secondary | ICD-10-CM | POA: Diagnosis not present

## 2017-12-20 DIAGNOSIS — Z800 Family history of malignant neoplasm of digestive organs: Secondary | ICD-10-CM | POA: Diagnosis not present

## 2017-12-20 DIAGNOSIS — I2510 Atherosclerotic heart disease of native coronary artery without angina pectoris: Secondary | ICD-10-CM | POA: Diagnosis not present

## 2017-12-20 DIAGNOSIS — Z8049 Family history of malignant neoplasm of other genital organs: Secondary | ICD-10-CM | POA: Diagnosis not present

## 2017-12-20 DIAGNOSIS — I248 Other forms of acute ischemic heart disease: Secondary | ICD-10-CM | POA: Diagnosis not present

## 2017-12-20 DIAGNOSIS — M1990 Unspecified osteoarthritis, unspecified site: Secondary | ICD-10-CM | POA: Diagnosis not present

## 2017-12-20 DIAGNOSIS — Y92230 Patient room in hospital as the place of occurrence of the external cause: Secondary | ICD-10-CM | POA: Diagnosis not present

## 2017-12-20 DIAGNOSIS — M109 Gout, unspecified: Secondary | ICD-10-CM | POA: Diagnosis not present

## 2017-12-20 DIAGNOSIS — R7989 Other specified abnormal findings of blood chemistry: Secondary | ICD-10-CM

## 2017-12-20 DIAGNOSIS — I5043 Acute on chronic combined systolic (congestive) and diastolic (congestive) heart failure: Secondary | ICD-10-CM | POA: Diagnosis not present

## 2017-12-20 DIAGNOSIS — Z8546 Personal history of malignant neoplasm of prostate: Secondary | ICD-10-CM | POA: Diagnosis not present

## 2017-12-20 DIAGNOSIS — N170 Acute kidney failure with tubular necrosis: Secondary | ICD-10-CM | POA: Diagnosis not present

## 2017-12-20 DIAGNOSIS — Z79899 Other long term (current) drug therapy: Secondary | ICD-10-CM | POA: Diagnosis not present

## 2017-12-20 DIAGNOSIS — N186 End stage renal disease: Secondary | ICD-10-CM | POA: Diagnosis not present

## 2017-12-20 DIAGNOSIS — R748 Abnormal levels of other serum enzymes: Secondary | ICD-10-CM | POA: Diagnosis present

## 2017-12-20 DIAGNOSIS — R778 Other specified abnormalities of plasma proteins: Secondary | ICD-10-CM

## 2017-12-20 DIAGNOSIS — R079 Chest pain, unspecified: Secondary | ICD-10-CM

## 2017-12-20 DIAGNOSIS — Z7982 Long term (current) use of aspirin: Secondary | ICD-10-CM | POA: Diagnosis not present

## 2017-12-20 DIAGNOSIS — I953 Hypotension of hemodialysis: Secondary | ICD-10-CM | POA: Diagnosis not present

## 2017-12-20 DIAGNOSIS — Z803 Family history of malignant neoplasm of breast: Secondary | ICD-10-CM | POA: Diagnosis not present

## 2017-12-20 DIAGNOSIS — D631 Anemia in chronic kidney disease: Secondary | ICD-10-CM | POA: Diagnosis not present

## 2017-12-20 DIAGNOSIS — I214 Non-ST elevation (NSTEMI) myocardial infarction: Secondary | ICD-10-CM | POA: Diagnosis present

## 2017-12-20 DIAGNOSIS — I480 Paroxysmal atrial fibrillation: Secondary | ICD-10-CM | POA: Diagnosis not present

## 2017-12-20 DIAGNOSIS — Z992 Dependence on renal dialysis: Secondary | ICD-10-CM | POA: Diagnosis not present

## 2017-12-20 LAB — CBC
HCT: 24.3 % — ABNORMAL LOW (ref 40.0–52.0)
HEMATOCRIT: 25.1 % — AB (ref 40.0–52.0)
HEMOGLOBIN: 8.3 g/dL — AB (ref 13.0–18.0)
Hemoglobin: 7.6 g/dL — ABNORMAL LOW (ref 13.0–18.0)
MCH: 22.1 pg — AB (ref 26.0–34.0)
MCH: 23 pg — AB (ref 26.0–34.0)
MCHC: 31.4 g/dL — AB (ref 32.0–36.0)
MCHC: 33 g/dL (ref 32.0–36.0)
MCV: 69.7 fL — AB (ref 80.0–100.0)
MCV: 70.3 fL — ABNORMAL LOW (ref 80.0–100.0)
Platelets: 214 10*3/uL (ref 150–440)
Platelets: 249 10*3/uL (ref 150–440)
RBC: 3.45 MIL/uL — ABNORMAL LOW (ref 4.40–5.90)
RBC: 3.6 MIL/uL — AB (ref 4.40–5.90)
RDW: 21.4 % — AB (ref 11.5–14.5)
RDW: 21.5 % — AB (ref 11.5–14.5)
WBC: 10.8 10*3/uL — AB (ref 3.8–10.6)
WBC: 9.7 10*3/uL (ref 3.8–10.6)

## 2017-12-20 LAB — BASIC METABOLIC PANEL
Anion gap: 13 (ref 5–15)
BUN: 39 mg/dL — AB (ref 6–20)
CALCIUM: 8.1 mg/dL — AB (ref 8.9–10.3)
CO2: 28 mmol/L (ref 22–32)
CREATININE: 4.77 mg/dL — AB (ref 0.61–1.24)
Chloride: 96 mmol/L — ABNORMAL LOW (ref 101–111)
GFR calc non Af Amer: 10 mL/min — ABNORMAL LOW (ref >60)
GFR, EST AFRICAN AMERICAN: 12 mL/min — AB (ref >60)
GLUCOSE: 107 mg/dL — AB (ref 65–99)
Potassium: 3.2 mmol/L — ABNORMAL LOW (ref 3.5–5.1)
SODIUM: 137 mmol/L (ref 135–145)

## 2017-12-20 LAB — COMPREHENSIVE METABOLIC PANEL
ALBUMIN: 3 g/dL — AB (ref 3.5–5.0)
ALT: 47 U/L (ref 17–63)
AST: 43 U/L — ABNORMAL HIGH (ref 15–41)
Alkaline Phosphatase: 181 U/L — ABNORMAL HIGH (ref 38–126)
Anion gap: 11 (ref 5–15)
BILIRUBIN TOTAL: 2.5 mg/dL — AB (ref 0.3–1.2)
BUN: 37 mg/dL — ABNORMAL HIGH (ref 6–20)
CALCIUM: 8.2 mg/dL — AB (ref 8.9–10.3)
CHLORIDE: 98 mmol/L — AB (ref 101–111)
CO2: 27 mmol/L (ref 22–32)
CREATININE: 4.67 mg/dL — AB (ref 0.61–1.24)
GFR, EST AFRICAN AMERICAN: 12 mL/min — AB (ref >60)
GFR, EST NON AFRICAN AMERICAN: 11 mL/min — AB (ref >60)
Glucose, Bld: 123 mg/dL — ABNORMAL HIGH (ref 65–99)
Potassium: 3.6 mmol/L (ref 3.5–5.1)
SODIUM: 136 mmol/L (ref 135–145)
TOTAL PROTEIN: 5.6 g/dL — AB (ref 6.5–8.1)

## 2017-12-20 LAB — APTT: aPTT: 36 seconds (ref 24–36)

## 2017-12-20 LAB — BRAIN NATRIURETIC PEPTIDE: B Natriuretic Peptide: 3648 pg/mL — ABNORMAL HIGH (ref 0.0–100.0)

## 2017-12-20 LAB — PROTIME-INR
INR: 1.45
Prothrombin Time: 17.5 seconds — ABNORMAL HIGH (ref 11.4–15.2)

## 2017-12-20 LAB — GLUCOSE, CAPILLARY
GLUCOSE-CAPILLARY: 93 mg/dL (ref 65–99)
GLUCOSE-CAPILLARY: 64 mg/dL — AB (ref 65–99)
Glucose-Capillary: 64 mg/dL — ABNORMAL LOW (ref 65–99)
Glucose-Capillary: 109 mg/dL — ABNORMAL HIGH (ref 65–99)

## 2017-12-20 LAB — HEPARIN LEVEL (UNFRACTIONATED): Heparin Unfractionated: 0.83 IU/mL — ABNORMAL HIGH (ref 0.30–0.70)

## 2017-12-20 LAB — PHOSPHORUS: PHOSPHORUS: 3.9 mg/dL (ref 2.5–4.6)

## 2017-12-20 LAB — TROPONIN I
TROPONIN I: 0.23 ng/mL — AB (ref <0.03)
TROPONIN I: 0.24 ng/mL — AB (ref <0.03)
TROPONIN I: 0.25 ng/mL — AB (ref <0.03)
Troponin I: 0.19 ng/mL (ref <0.03)

## 2017-12-20 MED ORDER — BACITRACIN-POLYMYXIN B 500-10000 UNIT/GM OP OINT
TOPICAL_OINTMENT | Freq: Four times a day (QID) | OPHTHALMIC | Status: DC
  Administered 2017-12-20 – 2017-12-21 (×3): via OPHTHALMIC
  Filled 2017-12-20: qty 3.5

## 2017-12-20 MED ORDER — EPOETIN ALFA 10000 UNIT/ML IJ SOLN: 4000.0000 [IU] | INTRAMUSCULAR | Status: DC

## 2017-12-20 MED ORDER — HEPARIN (PORCINE) IN NACL 100-0.45 UNIT/ML-% IJ SOLN
INTRAMUSCULAR | Status: AC
  Administered 2017-12-20: 900 [IU]/h via INTRAVENOUS
  Filled 2017-12-20: qty 250

## 2017-12-20 MED ORDER — MIDODRINE HCL 5 MG PO TABS
5.0000 mg | ORAL_TABLET | Freq: Three times a day (TID) | ORAL | Status: DC
  Administered 2017-12-20 – 2017-12-21 (×5): 5 mg via ORAL
  Filled 2017-12-20 (×5): qty 1

## 2017-12-20 MED ORDER — BENZONATATE 100 MG PO CAPS
200.0000 mg | ORAL_CAPSULE | Freq: Three times a day (TID) | ORAL | Status: DC
  Administered 2017-12-20 – 2017-12-21 (×3): 200 mg via ORAL
  Filled 2017-12-20 (×3): qty 2

## 2017-12-20 MED ORDER — HEPARIN (PORCINE) IN NACL 100-0.45 UNIT/ML-% IJ SOLN
750.0000 [IU]/h | INTRAMUSCULAR | Status: DC
  Administered 2017-12-20: 900 [IU]/h via INTRAVENOUS

## 2017-12-20 MED ORDER — FINASTERIDE 5 MG PO TABS
5.0000 mg | ORAL_TABLET | Freq: Every day | ORAL | Status: DC
  Administered 2017-12-21: 5 mg via ORAL
  Filled 2017-12-20: qty 1

## 2017-12-20 MED ORDER — HEPARIN SODIUM (PORCINE) 5000 UNIT/ML IJ SOLN
5000.0000 [IU] | Freq: Three times a day (TID) | INTRAMUSCULAR | Status: DC
  Administered 2017-12-21 (×2): 5000 [IU] via SUBCUTANEOUS
  Filled 2017-12-20 (×3): qty 1

## 2017-12-20 MED ORDER — ACETAMINOPHEN 325 MG PO TABS: 650.0000 mg | ORAL_TABLET | Freq: Four times a day (QID) | ORAL | Status: DC | PRN

## 2017-12-20 MED ORDER — ATORVASTATIN CALCIUM 20 MG PO TABS
40.0000 mg | ORAL_TABLET | Freq: Every day | ORAL | Status: DC
  Administered 2017-12-20: 40 mg via ORAL
  Filled 2017-12-20: qty 2

## 2017-12-20 MED ORDER — RENA-VITE PO TABS
1.0000 | ORAL_TABLET | Freq: Every day | ORAL | Status: DC
  Administered 2017-12-20: 1 via ORAL
  Filled 2017-12-20 (×2): qty 1

## 2017-12-20 MED ORDER — TRAZODONE HCL 50 MG PO TABS: 25.0000 mg | ORAL_TABLET | Freq: Every evening | ORAL | Status: DC | PRN

## 2017-12-20 MED ORDER — SALINE SPRAY 0.65 % NA SOLN
1.0000 | NASAL | Status: DC | PRN
  Filled 2017-12-20: qty 44

## 2017-12-20 MED ORDER — BISACODYL 5 MG PO TBEC: 5.0000 mg | DELAYED_RELEASE_TABLET | Freq: Every day | ORAL | Status: DC | PRN

## 2017-12-20 MED ORDER — HEPARIN BOLUS VIA INFUSION
4000.0000 [IU] | Freq: Once | INTRAVENOUS | Status: AC
  Administered 2017-12-20: 4000 [IU] via INTRAVENOUS
  Filled 2017-12-20: qty 4000

## 2017-12-20 MED ORDER — ASPIRIN 81 MG PO CHEW
324.0000 mg | CHEWABLE_TABLET | Freq: Once | ORAL | Status: AC
  Administered 2017-12-20: 324 mg via ORAL
  Filled 2017-12-20: qty 4

## 2017-12-20 MED ORDER — ASPIRIN EC 325 MG PO TBEC
325.0000 mg | DELAYED_RELEASE_TABLET | Freq: Every day | ORAL | Status: DC
  Administered 2017-12-21: 325 mg via ORAL
  Filled 2017-12-20: qty 1

## 2017-12-20 MED ORDER — ACETAMINOPHEN 650 MG RE SUPP: 650.0000 mg | Freq: Four times a day (QID) | RECTAL | Status: DC | PRN

## 2017-12-20 MED ORDER — MORPHINE SULFATE (PF) 2 MG/ML IV SOLN
2.0000 mg | Freq: Once | INTRAVENOUS | Status: AC
  Administered 2017-12-20: 2 mg via INTRAVENOUS
  Filled 2017-12-20: qty 1

## 2017-12-20 MED ORDER — AMIODARONE HCL 200 MG PO TABS
400.0000 mg | ORAL_TABLET | Freq: Two times a day (BID) | ORAL | Status: DC
  Administered 2017-12-21: 400 mg via ORAL
  Filled 2017-12-20 (×2): qty 2

## 2017-12-20 MED ORDER — DOCUSATE SODIUM 100 MG PO CAPS
100.0000 mg | ORAL_CAPSULE | Freq: Two times a day (BID) | ORAL | Status: DC
  Administered 2017-12-20 – 2017-12-21 (×2): 100 mg via ORAL
  Filled 2017-12-20 (×2): qty 1

## 2017-12-20 MED ORDER — FLUTICASONE PROPIONATE 50 MCG/ACT NA SUSP
2.0000 | Freq: Every day | NASAL | Status: DC
  Administered 2017-12-21: 2 via NASAL
  Filled 2017-12-20: qty 16

## 2017-12-20 MED ORDER — NEPRO/CARBSTEADY PO LIQD
237.0000 mL | Freq: Three times a day (TID) | ORAL | Status: DC
  Administered 2017-12-20 – 2017-12-21 (×3): 237 mL via ORAL

## 2017-12-20 MED ORDER — ONDANSETRON HCL 4 MG PO TABS: 4.0000 mg | ORAL_TABLET | Freq: Four times a day (QID) | ORAL | Status: DC | PRN

## 2017-12-20 MED ORDER — ONDANSETRON HCL 4 MG/2ML IJ SOLN: 4.0000 mg | Freq: Four times a day (QID) | INTRAMUSCULAR | Status: DC | PRN

## 2017-12-20 MED ORDER — PANTOPRAZOLE SODIUM 40 MG PO TBEC
40.0000 mg | DELAYED_RELEASE_TABLET | Freq: Every day | ORAL | Status: DC
  Administered 2017-12-21: 40 mg via ORAL
  Filled 2017-12-20: qty 1

## 2017-12-20 MED ORDER — HEPARIN BOLUS VIA INFUSION
4000.0000 [IU] | Freq: Once | INTRAVENOUS | Status: DC
  Filled 2017-12-20: qty 4000

## 2017-12-20 MED ORDER — HYDROCODONE-ACETAMINOPHEN 5-325 MG PO TABS
1.0000 | ORAL_TABLET | ORAL | Status: DC | PRN
  Administered 2017-12-20: 1 via ORAL
  Administered 2017-12-21: 2 via ORAL
  Filled 2017-12-20: qty 1
  Filled 2017-12-20: qty 2

## 2017-12-20 NOTE — Consult Note (Signed)
[facility] Consultation Note  Patient ID: Andrew [name], MRN: [id number], DOB/AGE: [birth year] 84 y.o. Admit date: 12/20/2017   Date of Consult: 12/20/2017 Primary Physician: [name], MD Primary Cardiologist: [name]  Chief Complaint:  Chief Complaint  Patient presents with  . Chest Pain  . Shortness of Breath   Reason for Consult: Heart failure  HPI: 84 y.o. male with known stage V chronic kidney disease on dialysis with essential hypertension mixed hyperlipidemia anemia with known coronary disease status post previous coronary artery stenting having a significant hypotension with dialysis with significant lower extremity edema shortness of breath and chest discomfort over the last several days.  He was seen in the emergency room at which time he had pulmonary edema severe lower extremity edema and a BNP of 3648 consistent with acute on chronic systolic dysfunction congestive heart failure.  This is also associated with a troponin of 0.25 most consistent with demand ischemia rather than acute coronary syndrome.  His chest pain now is resolved although his heart failure is multifactorial in nature including LV systolic dysfunction with an echocardiogram earlier this year showing an ejection fraction of 45% with moderate to severe mitral regurgitation and significant anemia.  EKG currently shows normal sinus rhythm with nonspecific ST and T wave changes for which she is also been treated with amiodarone due to paroxysmal nonvalvular atrial fibrillation.  The patient is currently not on any anticoagulation due to the significant anemia which is appropriate at this time.  With his coronary artery disease he does have high intensity cholesterol therapy which he tolerates well.  Currently he still is short of breath weak and fatigued with multiple issues as above  Past Medical History:  Diagnosis Date  . Arrhythmia    atrial fibrillation  . Arteriosclerosis of coronary artery  01/27/2014  . Benign essential HTN 12/11/2013  . Carpal tunnel syndrome   . Chronic kidney disease   . Coronary atherosclerosis of native coronary artery   . Disorder of mitral valve 12/11/2013  . Encounter for long-term (current) use of antiplatelets/antithrombotics   . Gout   . Hyperlipidemia   . Hypertension   . Lung nodule    found on xray in Aug 2018  . Mitral valve disorder   . Osteoarthritis   . Prostate cancer [facility])       Surgical History:  Past Surgical History:  Procedure Laterality Date  . COLONOSCOPY    . COLONOSCOPY WITH PROPOFOL N/A 07/23/2016   Procedure: COLONOSCOPY WITH PROPOFOL;  Surgeon: Manya Silvas, MD;  Location: [facility] ENDOSCOPY;  Service: Endoscopy;  Laterality: N/A;  . CORONARY ANGIOPLASTY WITH STENT PLACEMENT    . DIALYSIS/PERMA CATHETER INSERTION N/A 12/09/2017   Procedure: DIALYSIS/PERMA CATHETER INSERTION;  Surgeon: Algernon Huxley, MD;  Location: [place] [facility] LAB;  Service: Cardiovascular;  Laterality: N/A;  . DIALYSIS/PERMA CATHETER INSERTION N/A 12/16/2017   Procedure: DIALYSIS/PERMA CATHETER INSERTION;  Surgeon: Algernon Huxley, MD;  Location: [place] [facility];  Service: Cardiovascular;  Laterality: N/A;  . ESOPHAGOGASTRODUODENOSCOPY Left 07/28/2017   Procedure: ESOPHAGOGASTRODUODENOSCOPY (EGD);  Surgeon: Virgel Manifold, MD;  Location: [facility] ENDOSCOPY;  Service: Endoscopy;  Laterality: Left;  . ESOPHAGOGASTRODUODENOSCOPY (EGD) WITH PROPOFOL N/A 07/23/2016   Procedure: ESOPHAGOGASTRODUODENOSCOPY (EGD) WITH PROPOFOL;  Surgeon: Manya Silvas, MD;  Location: [facility] ENDOSCOPY;  Service: Endoscopy;  Laterality: N/A;  . PROSTATE BIOPSY       Home Meds: Prior to Admission medications   Medication Sig Start Date End Date Taking?  Authorizing Provider  amiodarone (PACERONE) 400 MG tablet Take 1 tablet (400 mg total) by mouth 2 (two) times daily. Take 400mg  oral twice a day for 1 week and then change to once a day after that 12/17/17  Yes Gladstone Lighter,  MD  aspirin EC 81 MG tablet Take 1 tablet (81 mg total) by mouth daily. 12/17/17 03/17/18 Yes Gladstone Lighter, MD  atorvastatin (LIPITOR) 40 MG tablet Take 40 mg by mouth daily. 04/24/15  Yes [provider]  benzonatate (TESSALON) 200 MG capsule Take 1 capsule (200 mg total) by mouth 3 (three) times daily. 12/17/17  Yes Gladstone Lighter, MD  finasteride (PROSCAR) 5 MG tablet Take 5 mg by mouth daily. 04/13/15  Yes [provider]  fluticasone (FLONASE) 50 MCG/ACT nasal spray Place 2 sprays into both nostrils daily. 12/18/17  Yes Gladstone Lighter, MD  guaiFENesin (MUCINEX) 600 MG 12 hr tablet Take 1 tablet (600 mg total) by mouth 2 (two) times daily. 12/17/17  Yes Gladstone Lighter, MD  midodrine (PROAMATINE) 5 MG tablet Take 1 tablet (5 mg total) by mouth 3 (three) times daily with meals. 12/17/17  Yes Gladstone Lighter, MD  multivitamin (RENA-VIT) TABS tablet Take 1 tablet by mouth at bedtime. 12/17/17  Yes Gladstone Lighter, MD  Nutritional Supplements (FEEDING SUPPLEMENT, NEPRO CARB STEADY,) LIQD Take 237 mLs by mouth 3 (three) times daily between meals. 12/17/17 01/16/18 Yes Gladstone Lighter, MD  omeprazole (PRILOSEC) 20 MG capsule Take 1 capsule (20 mg total) by mouth 2 (two) times daily. 07/28/17 07/28/18 Yes Mody, Ulice Bold, MD  sodium chloride (OCEAN) 0.65 % SOLN nasal spray Place 1 spray into both nostrils as needed for congestion. 12/17/17  Yes Gladstone Lighter, MD    Inpatient Medications:  . amiodarone  400 mg Oral BID  . aspirin EC  325 mg Oral Daily  . atorvastatin  40 mg Oral Daily  . benzonatate  200 mg Oral TID  . docusate sodium  100 mg Oral BID  . feeding supplement (NEPRO CARB STEADY)  237 mL Oral TID BM  . finasteride  5 mg Oral Daily  . fluticasone  2 spray Each Nare Daily  . midodrine  5 mg Oral TID WC  . multivitamin  1 tablet Oral QHS  . pantoprazole  40 mg Oral Daily   . heparin 900 Units/hr (12/20/17 0351)    Allergies: No Known Allergies  Social  History   Socioeconomic History  . Marital status: Married    Spouse name: Not on file  . Number of children: Not on file  . Years of education: Not on file  . Highest education level: Not on file  Occupational History  . Not on file  Social Needs  . Financial resource strain: Not on file  . Food insecurity:    Worry: Not on file    Inability: Not on file  . Transportation needs:    Medical: Not on file    Non-medical: Not on file  Tobacco Use  . Smoking status: Never Smoker  . Smokeless tobacco: Never Used  Substance and Sexual Activity  . Alcohol use: No  . Drug use: No  . Sexual activity: Never  Lifestyle  . Physical activity:    Days per week: Not on file    Minutes per session: Not on file  . Stress: Not on file  Relationships  . Social connections:    Talks on phone: Not on file    Gets together: Not on file    Attends  religious service: Not on file    Active member of club or organization: Not on file    Attends meetings of clubs or organizations: Not on file    Relationship status: Not on file  . Intimate partner violence:    Fear of current or ex partner: Not on file    Emotionally abused: Not on file    Physically abused: Not on file    Forced sexual activity: Not on file  Other Topics Concern  . Not on file  Social History Narrative  . Not on file     Family History  Problem Relation Age of Onset  . Cancer Mother        stomach and uterus  . Cancer Sister        breast     Review of Systems Positive for shortness of breath weakness fatigue cough congestion Negative for: General:  chills, fever, night sweats or weight changes.  Cardiovascular: PND orthopnea syncope dizziness  Dermatological skin lesions rashes Respiratory: Positive for cough congestion Urologic: Frequent urination urination at night and hematuria Abdominal: negative for nausea, vomiting, diarrhea, bright red blood per rectum, melena, or hematemesis Neurologic: negative for  visual changes, and/or hearing changes  All other systems reviewed and are otherwise negative except as noted above.  Labs: Recent Labs    12/20/17 0000  TROPONINI 0.25*   Lab Results  Component Value Date   WBC 9.7 12/20/2017   HGB 7.6 (L) 12/20/2017   HCT 24.3 (L) 12/20/2017   MCV 70.3 (L) 12/20/2017   PLT 214 12/20/2017    Recent Labs  Lab 12/20/17 0000 12/20/17 0409  NA 136 137  K 3.6 3.2*  CL 98* 96*  CO2 27 28  BUN 37* 39*  CREATININE 4.67* 4.77*  CALCIUM 8.2* 8.1*  PROT 5.6*  --   BILITOT 2.5*  --   ALKPHOS 181*  --   ALT 47  --   AST 43*  --   GLUCOSE 123* 107*   No results found for: CHOL, HDL, LDLCALC, TRIG No results found for: DDIMER  Radiology/Studies:  Dg Chest 2 View  Result Date: 12/20/2017 CLINICAL DATA:  Chest tightness with mild dyspnea. EXAM: CHEST - 2 VIEW COMPARISON:  12/01/2017 FINDINGS: Stable cardiomegaly with moderate aortic atherosclerosis. New bilateral small pleural effusions obscuring the diaphragms and spanning approximately of 1-1/2 to 2 vertebral body heights on the lateral view. New right IJ dialysis catheter tip terminates in the distal SVC. IMPRESSION: Cardiomegaly with new small bilateral pleural effusions. No overt pulmonary edema. New right IJ dialysis catheter is noted without apparent complication. Electronically Signed   By: Ashley Royalty M.D.   On: 12/20/2017 00:53   Dg Chest 2 View  Result Date: 12/01/2017 CLINICAL DATA:  Patient reports SOB, dizziness and bilateral LE swelling onset 2 days ago. Hx prostate ca, mitral valve disorder, lung nodule, HTN, cardiac stent placement. Non-smoker. EXAM: CHEST - 2 VIEW COMPARISON:  10/31/2017 FINDINGS: Heart is enlarged and stable in configuration. There no focal consolidations. Small RIGHT pleural effusion or pleural thickening persists and is smaller. No pulmonary edema. IMPRESSION: 1. Stable cardiomegaly. 2. Smaller RIGHT pleural effusion. Electronically Signed   By: Nolon Nations M.D.    On: 12/01/2017 11:06   US Renal  Result Date: 12/01/2017 CLINICAL DATA:  Renal failure, acute on chronic EXAM: RENAL / URINARY TRACT ULTRASOUND COMPLETE COMPARISON:  Ultrasound 04/09/2017.  MRI 05/28/2017 FINDINGS: Right Kidney: Length: 10.3 cm. Multiple cysts, the largest 7.2 cm in  the lower pole with thin septations. No hydronephrosis. Diffusely increased echotexture Left Kidney: Length: 10.9 cm. Multiple cysts, the largest 15 mm in the midpole. Increased echotexture. No hydronephrosis. Bladder: Slight wall irregularity and thickening.  Prostate enlargement. IMPRESSION: No hydronephrosis. Increased echotexture in the kidneys bilaterally compatible chronic medical renal disease. Bilateral benign appearing renal cysts. Mild bladder wall thickening, likely related to prostate enlargement. Electronically Signed   By: Rolm Baptise M.D.   On: 12/01/2017 17:22   Mr Foot Left Wo Contrast  Result Date: 12/03/2017 CLINICAL DATA:  Left foot and toe pain over the past few weeks. EXAM: MRI OF THE LEFT FOOT WITHOUT CONTRAST TECHNIQUE: Multiplanar, multisequence MR imaging of the left mid and forefoot was performed. No intravenous contrast was administered. COMPARISON:  Same day great toe radiographs. FINDINGS: Bones/Joint/Cartilage Osteoarthritis of the interphalangeal, first MTP and TMT joints of the great toe. Associated subchondral degenerative cystic change is noted involving the base and head of the first metatarsal and across the interphalangeal joint of the great toe. Lesser degrees of degenerative joint space narrowing of the DIP and PIP joints of the second through fifth digits. Areas of minimal edema involving the tuft of the great toe and cortical destruction of the middle phalanx of the second toe with minimal edema raise concern for early changes of osteomyelitis versus erosive changes possibly from an inflammatory arthropathy. Ligaments Noncontributory Muscles and Tendons No intramuscular fluid  collection or abscess. No significant muscle atrophy. No evidence of a tenosynovitis. The tarsal Soft tissues Soft tissue edema and swelling is noted along the dorsum of the mid and forefoot. No focal fluid collections are identified. IMPRESSION: 1. Osteoarthritis of the forefoot as above more notably involving the interphalangeal, first MTP and TMT joints with joint space narrowing and subchondral cystic change. 2. Lesser degrees of osteoarthritic joint space narrowing is seen of the DIP and PIP joints of the second through fifth digits and fourth TMT joints. 3. Faint marrow edema involving the tuft of the great toe cannot exclude posttraumatic or reactive edema versus early changes of an osteomyelitis. 4. Cortical irregularity of the second middle phalangeal head with slight marrow edema also raise concern for changes of osteomyelitis although changes from inflammatory arthropathy may also have a similar appearance. Correlate with dedicated foot and/or toe radiographs. 5. Soft tissue swelling over the dorsum of the included foot. Electronically Signed   By: Ashley Royalty M.D.   On: 12/03/2017 00:31   US Venous Img Lower Bilateral  Result Date: 12/02/2017 CLINICAL DATA:  Swelling in both legs, pain, history prostate cancer EXAM: BILATERAL LOWER EXTREMITY VENOUS DOPPLER ULTRASOUND TECHNIQUE: Gray-scale sonography with graded compression, as well as color Doppler and duplex ultrasound were performed to evaluate the lower extremity deep venous systems from the level of the common femoral vein and including the common femoral, femoral, profunda femoral, popliteal and calf veins including the posterior tibial, peroneal and gastrocnemius veins when visible. The superficial great saphenous vein was also interrogated. Spectral Doppler was utilized to evaluate flow at rest and with distal augmentation maneuvers in the common femoral, femoral and popliteal veins. COMPARISON:  None. FINDINGS: RIGHT LOWER EXTREMITY Common  Femoral Vein: No evidence of thrombus. Normal compressibility, respiratory phasicity and response to augmentation. Saphenofemoral Junction: No evidence of thrombus. Normal compressibility and flow on color Doppler imaging. Profunda Femoral Vein: No evidence of thrombus. Normal compressibility and flow on color Doppler imaging. Femoral Vein: No evidence of thrombus. Normal compressibility, respiratory phasicity and response to augmentation. Popliteal  Vein: No evidence of thrombus. Normal compressibility, respiratory phasicity and response to augmentation. Calf Veins: No evidence of thrombus. Normal compressibility and flow on color Doppler imaging. Superficial Great Saphenous Vein: No evidence of thrombus. Normal compressibility. Venous Reflux:  None. Other Findings:  None. LEFT LOWER EXTREMITY Common Femoral Vein: No evidence of thrombus. Normal compressibility, respiratory phasicity and response to augmentation. Saphenofemoral Junction: No evidence of thrombus. Normal compressibility and flow on color Doppler imaging. Profunda Femoral Vein: No evidence of thrombus. Normal compressibility and flow on color Doppler imaging. Femoral Vein: No evidence of thrombus. Normal compressibility, respiratory phasicity and response to augmentation. Popliteal Vein: No evidence of thrombus. Normal compressibility, respiratory phasicity and response to augmentation. Calf Veins: No evidence of thrombus. Normal compressibility and flow on color Doppler imaging. Superficial Great Saphenous Vein: No evidence of thrombus. Normal compressibility. Venous Reflux:  None. Other Findings:  None. IMPRESSION: No evidence of deep venous thrombosis in either lower extremity. Electronically Signed   By: Lavonia Dana M.D.   On: 12/02/2017 14:15   Dg Toe Great Left  Result Date: 12/02/2017 CLINICAL DATA:  Left great toe pain swelling. EXAM: LEFT GREAT TOE COMPARISON:  None. FINDINGS: No acute osseous abnormality. Chondrocalcinosis is seen in the  first metatarsophalangeal joint. IMPRESSION: Chondrocalcinosis in the first metatarsophalangeal joint can be seen with calcium pyrophosphate deposition disease. Electronically Signed   By: Lorin Picket M.D.   On: 12/02/2017 16:45    EKG: Normal sinus rhythm with nonspecific ST and T wave changes  Weights: Filed Weights   12/20/17 0005 12/20/17 0326  Weight: 160 lb (72.6 kg) 169 lb 15.6 oz (77.1 kg)     Physical Exam: Blood pressure 92/61, pulse 69, temperature 97.9 F (36.6 C), temperature source Oral, resp. rate 18, height 5\' 9"  (1.753 m), weight 169 lb 15.6 oz (77.1 kg), SpO2 93 %. Body mass index is 25.1 kg/m. General: Well developed, well nourished, in no acute distress. Head eyes ears nose throat: Normocephalic, atraumatic, sclera non-icteric, no xanthomas, nares are without discharge. No apparent thyromegaly and/or mass  Lungs: Normal respiratory effort.  Some wheezes, basilar rales, few rhonchi.  Heart: RRR with normal S1 S2.  2-3+ apical murmur gallop, no rub, PMI is normal size and placement, carotid upstroke normal without bruit, jugular venous pressure is normal Abdomen: Soft, non-tender, non-distended with normoactive bowel sounds. No hepatomegaly. No rebound/guarding. No obvious abdominal masses. Abdominal aorta is normal size without bruit Extremities: 2+ edema. no cyanosis, no clubbing, no ulcers  Peripheral : 2+ bilateral upper extremity pulses, 2+ bilateral femoral pulses, 2+ bilateral dorsal pedal pulse Neuro: Alert and oriented. No facial asymmetry. No focal deficit. Moves all extremities spontaneously. Musculoskeletal: Normal muscle tone without kyphosis Psych:  Responds to questions appropriately with a normal affect.    Assessment: 84 year old male with acute on chronic systolic dysfunction congestive heart failure with moderate to severe mitral regurgitation and chest pain with elevated troponin most consistent with demand ischemia rather than acute coronary  syndrome multifactorial in nature including a significant anemia and hypotension without significant improvements today  Plan: 1.  Continue supportive care of hypotension without additional medication management to exacerbate 2.  Continue amiodarone for heart rate control and maintenance of normal sinus rhythm 3.  No anticoagulation at this time due to significant anemia 4.  Further evaluation and treatment options of possible bleeding or other reasons for anemia 5.  Proceed to dialysis as before for significant lower extremity edema chronic kidney disease and heart failure not responding  to diuresis and/or other medication management at this time 6.  No further intervention of elevation of troponin more consistent with Mandt ischemia rather than acute coronary syndrome 7.  No further cardiac diagnostics necessary at this time until further treatment of above  Signed, Corey Skains M.D. [facility] 12/20/2017, 8:35 AM

## 2017-12-20 NOTE — Progress Notes (Signed)
[facility]  ROUNDING NOTE   Subjective:  Patient readmitted with chest pain. Had HD as outpt on Wednesday. Did exert himself a bit on Thursday. Subsequently developed chest pain and came back for evaluation.    Objective:  Vital signs in last 24 hours:  Temp:  [97.6 F (36.4 C)-98 F (36.7 C)] 97.6 F (36.4 C) (05/10 1505) Pulse Rate:  [66-78] 71 (05/10 1505) Resp:  [11-27] 14 (05/10 1510) BP: (90-104)/(54-66) 101/58 (05/10 1510) SpO2:  [90 %-100 %] 98 % (05/10 1505) Weight:  [72.6 kg (160 lb)-77.1 kg (169 lb 15.6 oz)] 73.8 kg (162 lb 11.2 oz) (05/10 1505)  Weight change:  Filed Weights   12/20/17 0326 12/20/17 1108 12/20/17 1505  Weight: 77.1 kg (169 lb 15.6 oz) 75.7 kg (166 lb 14.2 oz) 73.8 kg (162 lb 11.2 oz)    Intake/Output: No intake/output data recorded.   Intake/Output this shift:  Total I/O In: -  Out: 1533 [Other:1533]  Physical Exam: General: NAD, laying in bed  Head: Normocephalic, atraumatic. Moist oral mucosal membranes  Eyes: Anicteric,   Neck: Supple, trachea midline  Lungs:  Clear to auscultation  Heart: No rub S1S2  Abdomen:  Soft, nontender, BS present  Extremities: 2+ peripheral edema.   Neurologic: Nonfocal, moving all four extremities  Skin: Warm, dry, no rash  Access:  Right internal jugular permcath    Basic Metabolic Panel: Recent Labs  Lab 12/14/17 1034 12/15/17 1453 12/16/17 0524 12/16/17 1516 12/17/17 0718 12/20/17 0000 12/20/17 0409 12/20/17 1220  NA 135  --  134*  --  136 136 137  --   K 3.6  --  3.9  --  3.8 3.6 3.2*  --   CL 95*  --  94*  --  95* 98* 96*  --   CO2 27  --  26  --  29 27 28   --   GLUCOSE 168*  --  94  --  66 123* 107*  --   BUN 46*  --  64*  --  47* 37* 39*  --   CREATININE 4.15*  --  5.90*  --  4.76* 4.67* 4.77*  --   CALCIUM 8.6*  --  8.5*  --  8.3* 8.2* 8.1*  --   MG  --  2.1  --   --   --   --   --   --   PHOS  --   --   --  5.1*  --   --   --  3.9    Liver Function  Tests: Recent Labs  Lab 12/20/17 0000  AST 43*  ALT 47  ALKPHOS 181*  BILITOT 2.5*  PROT 5.6*  ALBUMIN 3.0*   No results for input(s): LIPASE, AMYLASE in the last 168 hours. No results for input(s): AMMONIA in the last 168 hours.  CBC: Recent Labs  Lab 12/14/17 1034 12/17/17 0718 12/20/17 0000 12/20/17 0409  WBC 9.9 12.6* 10.8* 9.7  HGB 7.9* 7.8* 8.3* 7.6*  HCT 25.5* 25.2* 25.1* 24.3*  MCV 70.4* 70.5* 69.7* 70.3*  PLT 205 191 249 214    Cardiac Enzymes: Recent Labs  Lab 12/20/17 0000 12/20/17 0907 12/20/17 1220  TROPONINI 0.25* 0.24* 0.23*    BNP: Invalid input(s): POCBNP  CBG: Recent Labs  Lab 12/20/17 1937  TKWIOX 73    Microbiology: Results for orders placed or performed during the hospital encounter of 12/01/17  MRSA PCR Screening     Status: None   Collection  Time: 12/05/17  8:00 AM  Result Value Ref Range Status   MRSA by PCR NEGATIVE NEGATIVE Final    Comment:        The GeneXpert MRSA Assay (FDA approved for NASAL specimens only), is one component of a comprehensive MRSA colonization surveillance program. It is not intended to diagnose MRSA infection nor to guide or monitor treatment for MRSA infections. Performed at [facility], [address] [place], [place] [address]     Coagulation Studies: Recent Labs    12/20/17 0200  LABPROT 17.5*  INR 1.45    Urinalysis: No results for input(s): COLORURINE, LABSPEC, PHURINE, GLUCOSEU, HGBUR, BILIRUBINUR, KETONESUR, PROTEINUR, UROBILINOGEN, NITRITE, LEUKOCYTESUR in the last 72 hours.  Invalid input(s): APPERANCEUR    Imaging: Dg Chest 2 View  Result Date: 12/20/2017 CLINICAL DATA:  Chest tightness with mild dyspnea. EXAM: CHEST - 2 VIEW COMPARISON:  12/01/2017 FINDINGS: Stable cardiomegaly with moderate aortic atherosclerosis. New bilateral small pleural effusions obscuring the diaphragms and spanning approximately of 1-1/2 to 2 vertebral body heights on the lateral view.  New right IJ dialysis catheter tip terminates in the distal SVC. IMPRESSION: Cardiomegaly with new small bilateral pleural effusions. No overt pulmonary edema. New right IJ dialysis catheter is noted without apparent complication. Electronically Signed   By: Andrew [name] M.D.   On: 12/20/2017 00:53     Medications:    . amiodarone  400 mg Oral BID  . aspirin EC  325 mg Oral Daily  . atorvastatin  40 mg Oral Daily  . benzonatate  200 mg Oral TID  . docusate sodium  100 mg Oral BID  . feeding supplement (NEPRO CARB STEADY)  237 mL Oral TID BM  . finasteride  5 mg Oral Daily  . fluticasone  2 spray Each Nare Daily  . [START ON 12/21/2017] heparin injection (subcutaneous)  5,000 Units Subcutaneous Q8H  . midodrine  5 mg Oral TID WC  . multivitamin  1 tablet Oral QHS  . pantoprazole  40 mg Oral Daily   acetaminophen **OR** acetaminophen, bisacodyl, HYDROcodone-acetaminophen, ondansetron **OR** ondansetron (ZOFRAN) IV, sodium chloride, traZODone  Assessment/ Plan:  [name] is a 84 y.o. black male with hypertension, hyperlipidemia, gout, ? history of prostate cancer, coronary artery disease, who was admitted to [facility] on 12/01/2017    1. Acute renal failure with chronic kidney disease stage III with bland urinalysis. Baseline creatinine of 2.1, GFR of 37 on 10/15/17.  Acute renal failure secondary to acute cardiorenal syndrome, ATN from hypotensive event and then with overdiuresis -Continues to have severe acute renal failure, no significant sign of renal recovery yet, due for HD today, orders prepared.    2.  Chest pain:  Evaluation and management per hospitalist and cardiology.   3. LE edema, CAD s/p stent placement, A Fib, Severe MR, pulmonary hypertension - likely cause of his severe lower extremity edema - Continue UF with HD.   4.  H/o prostate cancer 2016-2017 Adenocarcinoma- Seen by Dr Andrew [name] in 07/2015- missed f/u since then Treated with Finasteride PSA is elevated.   Patient will need continued follow-up with urology.  5..  Anemia chronic kidney disease.  Start patient on epogen 4000 IV with HD.    LOS: 0 Andrew [name] 5/10/20193:43 PM

## 2017-12-20 NOTE — H&P (Signed)
[place] [facility] [place] NAME: Andrew [name]    MR#:  [id number]  DATE OF BIRTH:  Mar 07, 1933  DATE OF ADMISSION:  12/20/2017  PRIMARY CARE PHYSICIAN: [name], MD   REQUESTING/REFERRING PHYSICIAN:   CHIEF COMPLAINT:   Chief Complaint  Patient presents with  . Chest Pain  . Shortness of Breath    HISTORY OF PRESENT ILLNESS: Andrew [name]  is a 84 y.o. male with a known history of atrial fibrillation, hypertension, coronary artery disease, recently diagnosed end-stage renal disease, started on hemodialysis. Patient presented to emergency room for acute onset of central chest tightness, described as continuous, 7 out of 10 in severity, without any radiation.  Patient denies having similar episodes in the past.  His pain did not improve with nitroglycerin. He denies any nausea, vomiting, dizziness, lightheadedness.  Patient was recently discharged from the hospital, treated for new onset renal failure started on dialysis.  Blood test done emergency room show troponin level is 0.25.  Hemoglobin level is 8.3.  Chest x-ray and EKG are noted without any acute changes.    PAST MEDICAL HISTORY:   Past Medical History:  Diagnosis Date  . Arrhythmia    atrial fibrillation  . Arteriosclerosis of coronary artery 01/27/2014  . Benign essential HTN 12/11/2013  . Carpal tunnel syndrome   . Chronic kidney disease   . Coronary atherosclerosis of native coronary artery   . Disorder of mitral valve 12/11/2013  . Encounter for long-term (current) use of antiplatelets/antithrombotics   . Gout   . Hyperlipidemia   . Hypertension   . Lung nodule    found on xray in Aug 2018  . Mitral valve disorder   . Osteoarthritis   . Prostate cancer ([place])     PAST SURGICAL HISTORY:  Past Surgical History:  Procedure Laterality Date  . COLONOSCOPY    . COLONOSCOPY WITH PROPOFOL N/A 07/23/2016   Procedure: COLONOSCOPY WITH PROPOFOL;  Surgeon: Manya Silvas,  MD;  Location: [facility] ENDOSCOPY;  Service: Endoscopy;  Laterality: N/A;  . CORONARY ANGIOPLASTY WITH STENT PLACEMENT    . DIALYSIS/PERMA CATHETER INSERTION N/A 12/09/2017   Procedure: DIALYSIS/PERMA CATHETER INSERTION;  Surgeon: Algernon Huxley, MD;  Location: [place] CV LAB;  Service: Cardiovascular;  Laterality: N/A;  . DIALYSIS/PERMA CATHETER INSERTION N/A 12/16/2017   Procedure: DIALYSIS/PERMA CATHETER INSERTION;  Surgeon: Algernon Huxley, MD;  Location: [place] CV LAB;  Service: Cardiovascular;  Laterality: N/A;  . ESOPHAGOGASTRODUODENOSCOPY Left 07/28/2017   Procedure: ESOPHAGOGASTRODUODENOSCOPY (EGD);  Surgeon: Virgel Manifold, MD;  Location: [facility] ENDOSCOPY;  Service: Endoscopy;  Laterality: Left;  . ESOPHAGOGASTRODUODENOSCOPY (EGD) WITH PROPOFOL N/A 07/23/2016   Procedure: ESOPHAGOGASTRODUODENOSCOPY (EGD) WITH PROPOFOL;  Surgeon: Manya Silvas, MD;  Location: [facility] ENDOSCOPY;  Service: Endoscopy;  Laterality: N/A;  . PROSTATE BIOPSY      SOCIAL HISTORY:  Social History   Tobacco Use  . Smoking status: Never Smoker  . Smokeless tobacco: Never Used  Substance Use Topics  . Alcohol use: No    FAMILY HISTORY:  Family History  Problem Relation Age of Onset  . Cancer Mother        stomach and uterus  . Cancer Sister        breast    DRUG ALLERGIES: No Known Allergies  REVIEW OF SYSTEMS:   CONSTITUTIONAL: No fever, fatigue or weakness.  EYES: No blurred or double vision.  EARS, NOSE, AND THROAT: No tinnitus or ear pain.  RESPIRATORY:  No cough, shortness of breath, wheezing or hemoptysis.  CARDIOVASCULAR: Positive for chest pain, no orthopnea, edema.  GASTROINTESTINAL: No nausea, vomiting, diarrhea or abdominal pain.  GENITOURINARY: No dysuria, hematuria.  ENDOCRINE: No polyuria, nocturia,  HEMATOLOGY: No bleeding SKIN: No rash or lesion. MUSCULOSKELETAL: No joint pain.   NEUROLOGIC: No focal weakness.  PSYCHIATRY: No anxiety or depression.   MEDICATIONS AT  HOME:  Prior to Admission medications   Medication Sig Start Date End Date Taking? Authorizing Provider  amiodarone (PACERONE) 400 MG tablet Take 1 tablet (400 mg total) by mouth 2 (two) times daily. Take 400mg  oral twice a day for 1 week and then change to once a day after that 12/17/17  Yes Gladstone Lighter, MD  aspirin EC 81 MG tablet Take 1 tablet (81 mg total) by mouth daily. 12/17/17 03/17/18 Yes Gladstone Lighter, MD  atorvastatin (LIPITOR) 40 MG tablet Take 40 mg by mouth daily. 04/24/15  Yes [provider]  benzonatate (TESSALON) 200 MG capsule Take 1 capsule (200 mg total) by mouth 3 (three) times daily. 12/17/17  Yes Gladstone Lighter, MD  finasteride (PROSCAR) 5 MG tablet Take 5 mg by mouth daily. 04/13/15  Yes [provider]  fluticasone (FLONASE) 50 MCG/ACT nasal spray Place 2 sprays into both nostrils daily. 12/18/17  Yes Gladstone Lighter, MD  guaiFENesin (MUCINEX) 600 MG 12 hr tablet Take 1 tablet (600 mg total) by mouth 2 (two) times daily. 12/17/17  Yes Gladstone Lighter, MD  midodrine (PROAMATINE) 5 MG tablet Take 1 tablet (5 mg total) by mouth 3 (three) times daily with meals. 12/17/17  Yes Gladstone Lighter, MD  multivitamin (RENA-VIT) TABS tablet Take 1 tablet by mouth at bedtime. 12/17/17  Yes Gladstone Lighter, MD  Nutritional Supplements (FEEDING SUPPLEMENT, NEPRO CARB STEADY,) LIQD Take 237 mLs by mouth 3 (three) times daily between meals. 12/17/17 01/16/18 Yes Gladstone Lighter, MD  omeprazole (PRILOSEC) 20 MG capsule Take 1 capsule (20 mg total) by mouth 2 (two) times daily. 07/28/17 07/28/18 Yes Mody, Ulice Bold, MD  sodium chloride (OCEAN) 0.65 % SOLN nasal spray Place 1 spray into both nostrils as needed for congestion. 12/17/17  Yes Gladstone Lighter, MD      PHYSICAL EXAMINATION:   VITAL SIGNS: Blood pressure (!) 91/58, pulse 68, temperature 97.7 F (36.5 C), temperature source Oral, resp. rate 20, height 5\' 9"  (1.753 m), weight 77.1 kg (169 lb 15.6 oz), SpO2  90 %.  GENERAL:  84 y.o.-year-old patient lying in the bed with mild distress, secondary to chest pain.  EYES: Pupils equal, round, reactive to light and accommodation. No scleral icterus. Extraocular muscles intact.  HEENT: Head atraumatic, normocephalic. Oropharynx and nasopharynx clear.  NECK:  Supple, no jugular venous distention. No thyroid enlargement, no tenderness.  LUNGS: Reduced breath sounds bilaterally, no wheezing, rales,rhonchi or crepitation. No use of accessory muscles of respiration.  CARDIOVASCULAR: S1, S2 normal. No S3/S4.  ABDOMEN: Soft, nontender, nondistended. Bowel sounds present. No organomegaly or mass.  EXTREMITIES: No pedal edema, cyanosis, or clubbing.  NEUROLOGIC: Cranial nerves II through XII are intact. Muscle strength 5/5 in all extremities. Sensation intact.  PSYCHIATRIC: The patient is alert and oriented x 3.  SKIN: No obvious rash, lesion, or ulcer.   LABORATORY PANEL:   CBC Recent Labs  Lab 12/14/17 1034 12/17/17 0718 12/20/17 0000 12/20/17 0409  WBC 9.9 12.6* 10.8* 9.7  HGB 7.9* 7.8* 8.3* 7.6*  HCT 25.5* 25.2* 25.1* 24.3*  PLT 205 191 249 214  MCV 70.4* 70.5* 69.7* 70.3*  MCH 21.8* 21.7* 23.0* 22.1*  MCHC 30.9* 30.9* 33.0 31.4*  RDW 20.6* 20.7* 21.4* 21.5*   ------------------------------------------------------------------------------------------------------------------  Chemistries  Recent Labs  Lab 12/13/17 1416 12/14/17 1034 12/15/17 1453 12/16/17 0524 12/17/17 0718 12/20/17 0000 12/20/17 0409  NA  --  135  --  134* 136 136 137  K  --  3.6  --  3.9 3.8 3.6 3.2*  CL  --  95*  --  94* 95* 98* 96*  CO2  --  27  --  26 29 27 28   GLUCOSE  --  168*  --  94 66 123* 107*  BUN  --  46*  --  64* 47* 37* 39*  CREATININE  --  4.15*  --  5.90* 4.76* 4.67* 4.77*  CALCIUM  --  8.6*  --  8.5* 8.3* 8.2* 8.1*  MG 2.2  --  2.1  --   --   --   --   AST  --   --   --   --   --  43*  --   ALT  --   --   --   --   --  47  --   ALKPHOS  --   --    --   --   --  181*  --   BILITOT  --   --   --   --   --  2.5*  --    ------------------------------------------------------------------------------------------------------------------ estimated creatinine clearance is 11.9 mL/min (A) (by C-G formula based on SCr of 4.77 mg/dL (H)). ------------------------------------------------------------------------------------------------------------------ No results for input(s): TSH, T4TOTAL, T3FREE, THYROIDAB in the last 72 hours.  Invalid input(s): FREET3   Coagulation profile Recent Labs  Lab 12/20/17 0200  INR 1.45   ------------------------------------------------------------------------------------------------------------------- No results for input(s): DDIMER in the last 72 hours. -------------------------------------------------------------------------------------------------------------------  Cardiac Enzymes Recent Labs  Lab 12/20/17 0000  TROPONINI 0.25*   ------------------------------------------------------------------------------------------------------------------ Invalid input(s): POCBNP  ---------------------------------------------------------------------------------------------------------------  Urinalysis    Component Value Date/Time   COLORURINE YELLOW (A) 12/01/2017 2009   APPEARANCEUR CLEAR (A) 12/01/2017 2009   LABSPEC 1.012 12/01/2017 2009   PHURINE 5.0 12/01/2017 2009   GLUCOSEU NEGATIVE 12/01/2017 2009   HGBUR NEGATIVE 12/01/2017 2009   BILIRUBINUR NEGATIVE 12/01/2017 2009   [place] NEGATIVE 12/01/2017 2009   PROTEINUR NEGATIVE 12/01/2017 2009   NITRITE NEGATIVE 12/01/2017 2009   LEUKOCYTESUR NEGATIVE 12/01/2017 2009     RADIOLOGY: Dg Chest 2 View  Result Date: 12/20/2017 CLINICAL DATA:  Chest tightness with mild dyspnea. EXAM: CHEST - 2 VIEW COMPARISON:  12/01/2017 FINDINGS: Stable cardiomegaly with moderate aortic atherosclerosis. New bilateral small pleural effusions obscuring the  diaphragms and spanning approximately of 1-1/2 to 2 vertebral body heights on the lateral view. New right IJ dialysis catheter tip terminates in the distal SVC. IMPRESSION: Cardiomegaly with new small bilateral pleural effusions. No overt pulmonary edema. New right IJ dialysis catheter is noted without apparent complication. Electronically Signed   By: Ashley Royalty M.D.   On: 12/20/2017 00:53    EKG: Orders placed or performed during the hospital encounter of 12/20/17  . EKG 12-Lead  . EKG 12-Lead    IMPRESSION AND PLAN:  1.  Chest pain, will rule out ACS.  Will start patient on heparin drip and aspirin.  Continue to monitor on telemetry and follow the troponin levels.  Cardiology is consulted for further evaluation and treatment.  2. ESRD, newly diagnosed, recently started on hemodialysis.  Continue management per nephrology.  3.  Anemia  of chronic disease, hemoglobin level is stable at 8.3.  No active bleeding.  Continue to monitor hemoglobin level closely.  4.  Paroxysmal atrial fibrillation, rate controlled in SR. continue amiodarone.  Per family, patient is not on anticoagulation due to history of bleeding.  5.  Hypotension, continue Midrin 3 times a day, per nephrology.  Avoid medications with potential of lowering the blood pressure.  All the records are reviewed and case discussed with ED provider. Management plans discussed with the patient, family and they are in agreement.  CODE STATUS: DNR    Code Status Orders  (From admission, onward)        Start     Ordered   12/20/17 0308  Do not attempt resuscitation (DNR)  Continuous    Question Answer Comment  In the event of cardiac or respiratory ARREST Do not call a "code blue"   In the event of cardiac or respiratory ARREST Do not perform Intubation, CPR, defibrillation or ACLS   In the event of cardiac or respiratory ARREST Use medication by any route, position, wound care, and other measures to relive pain and suffering. May  use oxygen, suction and manual treatment of airway obstruction as needed for comfort.      12/20/17 0308    Code Status History    Date Active Date Inactive Code Status Order ID Comments User Context   12/02/2017 1128 12/17/2017 1611 DNR [id number]  Max Sane, MD Inpatient   12/01/2017 1522 12/02/2017 1128 Full Code [id number]  Demetrios Loll, MD Inpatient   10/30/2017 2256 11/02/2017 1709 Full Code [id number]  Lance Coon, MD Inpatient   07/26/2017 1728 07/28/2017 1620 Full Code [id number]  Saundra Shelling, MD Inpatient   04/08/2017 1555 04/10/2017 1526 Full Code [id number]  Dustin Flock, MD Inpatient    Advance Directive Documentation     Most Recent Value  Type of Advance Directive  Living will  Pre-existing out of facility DNR order (yellow form or pink MOST form)  -  "MOST" Form in Place?  -       TOTAL TIME TAKING CARE OF THIS PATIENT: 45 minutes.    Amelia Jo M.D on 12/20/2017 at 5:50 AM  Between 7am to 6pm - Pager - [phone]  After 6pm go to [email or web address] - password [email or web address] [facility]  [place]  Office  [phone]  CC: Primary care physician; [name], MD

## 2017-12-20 NOTE — Progress Notes (Signed)
ANTICOAGULATION CONSULT NOTE - Initial Consult  Pharmacy Consult for heparin Indication: chest pain/ACS  No Known Allergies  Patient Measurements: Height: 5\' 9"  (175.3 cm) Weight: 160 lb (72.6 kg) IBW/kg (Calculated) : 70.7 Heparin Dosing Weight: 72.6 kg  Vital Signs: Temp: 97.6 F (36.4 C) (05/10 0006) Temp Source: Oral (05/10 0006) BP: 104/54 (05/10 0130) Pulse Rate: 71 (05/10 0100)  Labs: Recent Labs    12/17/17 0718 12/20/17 0000  HGB 7.8* 8.3*  HCT 25.2* 25.1*  PLT 191 249  CREATININE 4.76* 4.67*  TROPONINI  --  0.25*    Estimated Creatinine Clearance: 12.2 mL/min (A) (by C-G formula based on SCr of 4.67 mg/dL (H)).   Medical History: Past Medical History:  Diagnosis Date  . Arrhythmia    atrial fibrillation  . Arteriosclerosis of coronary artery 01/27/2014  . Benign essential HTN 12/11/2013  . Carpal tunnel syndrome   . Chronic kidney disease   . Coronary atherosclerosis of native coronary artery   . Disorder of mitral valve 12/11/2013  . Encounter for long-term (current) use of antiplatelets/antithrombotics   . Gout   . Hyperlipidemia   . Hypertension   . Lung nodule    found on xray in Aug 2018  . Mitral valve disorder   . Osteoarthritis   . Prostate cancer ([place])     Medications:  Scheduled:  . heparin  4,000 Units Intravenous Once    Assessment: Patient admitted for CP, was admitted prior for perm cath placement for dialysis. Patient also has h/o of afib was on eliquis for a few days, last dose was 04/29 then stopped. Patient now presents with CP w/ trops 0.25, EKG NSR. Patient is being started on heparin drip -- has a h/o supratherapeutic anti-Xa's @ 1100 units/hr last admission.  Goal of Therapy:  Anti-Xa level goal: 0.3 - 0.7 Monitor platelets by anticoagulation protocol: Yes   Plan:  Will bolus patient w/ heparin 4000 units IV x 1 Will start drip @ 900 units/hr Baseline labs ordered Baseline Hgb 8.3 Will draw next anti-Xa @  1000 Will continue to monitor daily CBC's and adjust per anti-Xa levels.  Tobie Lords, PharmD, BCPS Clinical Pharmacist 12/20/2017

## 2017-12-20 NOTE — Progress Notes (Signed)
HD tx start    12/20/17 1114  Vital Signs  Pulse Rate 66  Pulse Rate Source Monitor  Resp 11  BP 93/60  BP Location Left Arm  BP Method Automatic  Patient Position (if appropriate) Lying  Oxygen Therapy  SpO2 98 %  O2 Device Room Air  During Hemodialysis Assessment  Blood Flow Rate (mL/min) 400 mL/min  Arterial Pressure (mmHg) -130 mmHg  Venous Pressure (mmHg) 120 mmHg  Transmembrane Pressure (mmHg) 70 mmHg  Ultrafiltration Rate (mL/min) 570 mL/min  Dialysate Flow Rate (mL/min) 800 ml/min  Conductivity: Machine  14.2  HD Safety Checks Performed Yes  Dialysis Fluid Bolus Normal Saline  Bolus Amount (mL) 250 mL  Intra-Hemodialysis Comments Tx initiated

## 2017-12-20 NOTE — ED Provider Notes (Signed)
[facility] Emergency Department Provider Note   ____________________________________________   First MD Initiated Contact with Patient 12/20/17 0005     (approximate)  I have reviewed the triage vital signs and the nursing notes.   HISTORY  Chief Complaint Chest Pain and Shortness of Breath    HPI Andrew [name] is a 84 y.o. male who comes into the hospital today with some chest tightness.  He was released from the hospital a couple of days ago with some new onset renal failure and has recently started dialysis.  The patient is getting dialysis Monday Wednesday and Friday and did have dialysis yesterday.  He is due tomorrow.  He states that his pain is a 7 out of 10 in intensity and again feels like tightness.  He has some significant edema in his lower extremities.  The patient states that the chest tightness started around 4 PM.  He was just sitting in its in his mid chest.  There is no radiation to the pain but he has some mild shortness of breath.  He denies any nausea, vomiting, dizziness, lightheadedness.  The patient is here today for evaluation of his symptoms.   Past Medical History:  Diagnosis Date  . Arrhythmia    atrial fibrillation  . Arteriosclerosis of coronary artery 01/27/2014  . Benign essential HTN 12/11/2013  . Carpal tunnel syndrome   . Chronic kidney disease   . Coronary atherosclerosis of native coronary artery   . Disorder of mitral valve 12/11/2013  . Encounter for long-term (current) use of antiplatelets/antithrombotics   . Gout   . Hyperlipidemia   . Hypertension   . Lung nodule    found on xray in Aug 2018  . Mitral valve disorder   . Osteoarthritis   . Prostate cancer [facility])     Patient Active Problem List   Diagnosis Date Noted  . NSTEMI (non-ST elevated myocardial infarction) ([place]) 12/20/2017  . ESRD (end stage renal disease) ([place]) 12/19/2017  . Atrial fibrillation ([place]) 12/19/2017  . Pressure injury of skin 12/02/2017   . Renal failure (ARF), acute on chronic (HCC) 12/01/2017  . Unstable angina ([place]) 10/31/2017  . Elevated troponin 10/30/2017  . Chronic systolic CHF (congestive heart failure) ([place]) 10/30/2017  . Acute posthemorrhagic anemia   . GERD (gastroesophageal reflux disease)   . PAD (peripheral artery disease) ([place]) 06/04/2016  . Colon polyp 06/29/2015  . Malignant neoplasm of prostate ([place]) 11/14/2014  . HLD (hyperlipidemia) 07/29/2014  . Long term current use of antithrombotics/antiplatelets 01/27/2014  . Arteriosclerosis of coronary artery 01/27/2014  . Carpal tunnel syndrome 12/11/2013  . Benign essential HTN 12/11/2013  . Lesion of ulnar nerve 12/11/2013  . Disorder of mitral valve 12/11/2013  . Arthritis, degenerative 12/11/2013  . Hereditary and idiopathic neuropathy 12/11/2013    Past Surgical History:  Procedure Laterality Date  . COLONOSCOPY    . COLONOSCOPY WITH PROPOFOL N/A 07/23/2016   Procedure: COLONOSCOPY WITH PROPOFOL;  Surgeon: Manya Silvas, MD;  Location: [facility] ENDOSCOPY;  Service: Endoscopy;  Laterality: N/A;  . CORONARY ANGIOPLASTY WITH STENT PLACEMENT    . DIALYSIS/PERMA CATHETER INSERTION N/A 12/09/2017   Procedure: DIALYSIS/PERMA CATHETER INSERTION;  Surgeon: Algernon Huxley, MD;  Location: [place] CV LAB;  Service: Cardiovascular;  Laterality: N/A;  . DIALYSIS/PERMA CATHETER INSERTION N/A 12/16/2017   Procedure: DIALYSIS/PERMA CATHETER INSERTION;  Surgeon: Algernon Huxley, MD;  Location: [place] CV LAB;  Service: Cardiovascular;  Laterality: N/A;  . ESOPHAGOGASTRODUODENOSCOPY Left 07/28/2017  Procedure: ESOPHAGOGASTRODUODENOSCOPY (EGD);  Surgeon: Virgel Manifold, MD;  Location: [facility] ENDOSCOPY;  Service: Endoscopy;  Laterality: Left;  . ESOPHAGOGASTRODUODENOSCOPY (EGD) WITH PROPOFOL N/A 07/23/2016   Procedure: ESOPHAGOGASTRODUODENOSCOPY (EGD) WITH PROPOFOL;  Surgeon: Manya Silvas, MD;  Location: [facility] ENDOSCOPY;  Service: Endoscopy;  Laterality: N/A;    . PROSTATE BIOPSY      Prior to Admission medications   Medication Sig Start Date End Date Taking? Authorizing Provider  amiodarone (PACERONE) 400 MG tablet Take 1 tablet (400 mg total) by mouth 2 (two) times daily. Take 400mg  oral twice a day for 1 week and then change to once a day after that 12/17/17  Yes Gladstone Lighter, MD  aspirin EC 81 MG tablet Take 1 tablet (81 mg total) by mouth daily. 12/17/17 03/17/18 Yes Gladstone Lighter, MD  atorvastatin (LIPITOR) 40 MG tablet Take 40 mg by mouth daily. 04/24/15  Yes [provider]  benzonatate (TESSALON) 200 MG capsule Take 1 capsule (200 mg total) by mouth 3 (three) times daily. 12/17/17  Yes Gladstone Lighter, MD  finasteride (PROSCAR) 5 MG tablet Take 5 mg by mouth daily. 04/13/15  Yes [provider]  fluticasone (FLONASE) 50 MCG/ACT nasal spray Place 2 sprays into both nostrils daily. 12/18/17  Yes Gladstone Lighter, MD  guaiFENesin (MUCINEX) 600 MG 12 hr tablet Take 1 tablet (600 mg total) by mouth 2 (two) times daily. 12/17/17  Yes Gladstone Lighter, MD  midodrine (PROAMATINE) 5 MG tablet Take 1 tablet (5 mg total) by mouth 3 (three) times daily with meals. 12/17/17  Yes Gladstone Lighter, MD  multivitamin (RENA-VIT) TABS tablet Take 1 tablet by mouth at bedtime. 12/17/17  Yes Gladstone Lighter, MD  Nutritional Supplements (FEEDING SUPPLEMENT, NEPRO CARB STEADY,) LIQD Take 237 mLs by mouth 3 (three) times daily between meals. 12/17/17 01/16/18 Yes Gladstone Lighter, MD  omeprazole (PRILOSEC) 20 MG capsule Take 1 capsule (20 mg total) by mouth 2 (two) times daily. 07/28/17 07/28/18 Yes Mody, Ulice Bold, MD  sodium chloride (OCEAN) 0.65 % SOLN nasal spray Place 1 spray into both nostrils as needed for congestion. 12/17/17  Yes Gladstone Lighter, MD    Allergies Patient has no known allergies.  Family History  Problem Relation Age of Onset  . Cancer Mother        stomach and uterus  . Cancer Sister        breast    Social  History Social History   Tobacco Use  . Smoking status: Never Smoker  . Smokeless tobacco: Never Used  Substance Use Topics  . Alcohol use: No  . Drug use: No    Review of Systems  Constitutional: No fever/chills Eyes: No visual changes. ENT: No sore throat. Cardiovascular:  chest pain. Respiratory:  shortness of breath. Gastrointestinal: No abdominal pain.  No nausea, no vomiting.  No diarrhea.  No constipation. Genitourinary: Negative for dysuria. Musculoskeletal: Negative for back pain. Skin: Negative for rash. Neurological: Negative for headaches, focal weakness or numbness.   ____________________________________________   PHYSICAL EXAM:  VITAL SIGNS: ED Triage Vitals  Enc Vitals Group     BP 12/20/17 0006 99/62     Pulse Rate 12/20/17 0006 78     Resp 12/20/17 0006 (!) 22     Temp 12/20/17 0006 97.6 F (36.4 C)     Temp Source 12/20/17 0006 Oral     SpO2 12/20/17 0006 99 %     Weight 12/20/17 0005 160 lb (72.6 kg)     Height 12/20/17 0005 5'  9" (1.753 m)     Head Circumference --      Peak Flow --      Pain Score 12/20/17 0005 8     Pain Loc --      Pain Edu? --      Excl. in [place]? --     Constitutional: Alert and oriented. Well appearing and in mild to moderate distress. Eyes: Conjunctivae are normal. PERRL. EOMI. Head: Atraumatic. Nose: No congestion/rhinnorhea. Mouth/Throat: Mucous membranes are moist.  Oropharynx non-erythematous. Cardiovascular: Normal rate, regular rhythm.  Systolic murmur.  Good peripheral circulation. Respiratory: Normal respiratory effort.  No retractions. Lungs CTAB. Gastrointestinal: Soft and nontender. No distention.  Positive bowel sounds Musculoskeletal: Significant lower extremity pitting edema Neurologic:  Normal speech and language.  Skin:  Skin is warm, dry and intact.  Psychiatric: Mood and affect are normal.   ____________________________________________   LABS (all labs ordered are listed, but only abnormal  results are displayed)  Labs Reviewed  CBC - Abnormal; Notable for the following components:      Result Value   WBC 10.8 (*)    RBC 3.60 (*)    Hemoglobin 8.3 (*)    HCT 25.1 (*)    MCV 69.7 (*)    MCH 23.0 (*)    RDW 21.4 (*)    All other components within normal limits  COMPREHENSIVE METABOLIC PANEL - Abnormal; Notable for the following components:   Chloride 98 (*)    Glucose, Bld 123 (*)    BUN 37 (*)    Creatinine, Ser 4.67 (*)    Calcium 8.2 (*)    Total Protein 5.6 (*)    Albumin 3.0 (*)    AST 43 (*)    Alkaline Phosphatase 181 (*)    Total Bilirubin 2.5 (*)    GFR calc non Af Amer 11 (*)    GFR calc Af Amer 12 (*)    All other components within normal limits  TROPONIN I - Abnormal; Notable for the following components:   Troponin I 0.25 (*)    All other components within normal limits  BRAIN NATRIURETIC PEPTIDE - Abnormal; Notable for the following components:   B Natriuretic Peptide 3,648.0 (*)    All other components within normal limits  APTT  PROTIME-INR  HEPARIN LEVEL (UNFRACTIONATED)   ____________________________________________  EKG  ED ECG REPORT I, Loney Hering, the attending physician, personally viewed and interpreted this ECG.   Date: 12/20/2017  EKG Time: 0005  Rate: 78  Rhythm: normal sinus rhythm  Axis: normal  Intervals:none  ST&T Change: none  ____________________________________________  RADIOLOGY  ED MD interpretation:  CXR: Cardiomegaly with new small bilateral pleural effusions, no overt pulmonary edema, new right IJ dialysis catheter noted  Official radiology report(s): Dg Chest 2 View  Result Date: 12/20/2017 CLINICAL DATA:  Chest tightness with mild dyspnea. EXAM: CHEST - 2 VIEW COMPARISON:  12/01/2017 FINDINGS: Stable cardiomegaly with moderate aortic atherosclerosis. New bilateral small pleural effusions obscuring the diaphragms and spanning approximately of 1-1/2 to 2 vertebral body heights on the lateral view.  New right IJ dialysis catheter tip terminates in the distal SVC. IMPRESSION: Cardiomegaly with new small bilateral pleural effusions. No overt pulmonary edema. New right IJ dialysis catheter is noted without apparent complication. Electronically Signed   By: Ashley Royalty M.D.   On: 12/20/2017 00:53    ____________________________________________   PROCEDURES  Procedure(s) performed: None  Procedures  Critical Care performed: No  ____________________________________________   INITIAL IMPRESSION /  ASSESSMENT AND PLAN / ED COURSE  As part of my medical decision making, I reviewed the following data within the electronic MEDICAL RECORD NUMBER Notes from prior ED visits and [place] Controlled Substance Database   This is an 84 year old male with a history of end-stage renal disease on dialysis who comes into the hospital today with some chest tightness and shortness of breath.  My differential diagnosis includes fluid overload, acute coronary syndrome, pneumonia, effusion.  We will check a CBC CMP troponin BNP and a chest x-ray on the patient.  He will be reassessed once I received the results.  He is not in any distress nor is he hypoxic at this time.  The patient's troponin returned at 0.25.  His BNP is 3648.  Given the patient's chest pain and his elevated troponin I will bring him into the hospital for further evaluation.  The patient will receive 325 mg of aspirin as well as 2 mg of morphine and he will be placed on a heparin drip.  The patient will be admitted to the hospitalist service.      ____________________________________________   FINAL CLINICAL IMPRESSION(S) / ED DIAGNOSES  Final diagnoses:  Chest pain, unspecified type  Elevated troponin     ED Discharge Orders    None       Note:  This document was prepared using Dragon voice recognition software and may include unintentional dictation errors.    Loney Hering, MD 12/20/17 Reece Agar

## 2017-12-20 NOTE — Progress Notes (Signed)
84 year old male with medical problems including CAD, hypertension, hyperlipidemia who was just discharged from the hospital 3 days ago when he was here for A. fib RVR, acute renal failure being started on dialysis is brought back in secondary to chest pain. -Patient admitted by Dr. Duane Boston just this morning.  For full details please review H&P -Patient denies any chest pain at this time.  Appears very comfortable.  Would be from fluid overload.  Will benefit from extra session of dialysis.  Last dialysis was Wednesday.   Echo done last time showing decrease in EF and some wall motion abnormalities but because of his renal function cardiac cath was not done. -His troponins are elevated but plataeued.  Could be from his renal insufficiency. -Nephrology and cardiology consults pending.  Patient remains on heparin drip.  But will not continue that if cardiology does not think this is NSTEMI.  Due to his significant anemia

## 2017-12-20 NOTE — ED Notes (Signed)
Pt moved to Room 232

## 2017-12-20 NOTE — Progress Notes (Signed)
Post HD assessment    12/20/17 1510  Neurological  Level of Consciousness Alert  Orientation Level Oriented X4  Respiratory  Respiratory Pattern Regular;Unlabored  Chest Assessment Chest expansion symmetrical  Cardiac  ECG Monitor Yes  Vascular  R Radial Pulse +2  L Radial Pulse +2  Edema Generalized  Integumentary  Integumentary (WDL) X  Skin Color Appropriate for ethnicity  Musculoskeletal  Musculoskeletal (WDL) X  Generalized Weakness Yes  Assistive Device None  GU Assessment  Genitourinary (WDL) X  Genitourinary Symptoms  (HD)  Psychosocial  Psychosocial (WDL) WDL

## 2017-12-20 NOTE — Progress Notes (Signed)
Pts CBG is 64. RN instructs NT to give 4oz of juice and recheck in 15 minutes. I will continue to assess.

## 2017-12-20 NOTE — Progress Notes (Signed)
Post HD assessment. Pt tolerated tx well without c/o or complications. Net UF 1533, goal met. Dressing changed, lines clamped and capped.    12/20/17 1505  Vital Signs  Temp 97.6 F (36.4 C)  Temp Source Oral  Pulse Rate 71  Pulse Rate Source Monitor  Resp (!) 22  BP 100/61  BP Location Left Arm  BP Method Automatic  Patient Position (if appropriate) Lying  Oxygen Therapy  SpO2 98 %  O2 Device Room Air  Dialysis Weight  Weight 73.8 kg (162 lb 11.2 oz)  Type of Weight Post-Dialysis  Post-Hemodialysis Assessment  Rinseback Volume (mL) 250 mL  KECN 80.1 V  Dialyzer Clearance Lightly streaked  Duration of HD Treatment -hour(s) 3.5 hour(s)  Hemodialysis Intake (mL) 500 mL  UF Total -Machine (mL) 2033 mL  Net UF (mL) 1533 mL  Tolerated HD Treatment Yes  Education / Care Plan  Dialysis Education Provided Yes  Documented Education in Care Plan Yes

## 2017-12-20 NOTE — Progress Notes (Signed)
ANTICOAGULATION CONSULT NOTE - Initial Consult  Pharmacy Consult for heparin Indication: chest pain/ACS  No Known Allergies  Patient Measurements: Height: 5\' 9"  (175.3 cm) Weight: 169 lb 15.6 oz (77.1 kg) IBW/kg (Calculated) : 70.7 Heparin Dosing Weight: 77.1 kg  Vital Signs: Temp: 97.9 F (36.6 C) (05/10 0730) Temp Source: Oral (05/10 0730) BP: 92/61 (05/10 0730) Pulse Rate: 69 (05/10 0730)  Labs: Recent Labs    12/20/17 0000 12/20/17 0200 12/20/17 0409 12/20/17 0907  HGB 8.3*  --  7.6*  --   HCT 25.1*  --  24.3*  --   PLT 249  --  214  --   APTT  --  36  --   --   LABPROT  --  17.5*  --   --   INR  --  1.45  --   --   HEPARINUNFRC  --   --   --  0.83*  CREATININE 4.67*  --  4.77*  --   TROPONINI 0.25*  --   --  0.24*    Estimated Creatinine Clearance: 11.9 mL/min (A) (by C-G formula based on SCr of 4.77 mg/dL (H)).   Medical History: Past Medical History:  Diagnosis Date  . Arrhythmia    atrial fibrillation  . Arteriosclerosis of coronary artery 01/27/2014  . Benign essential HTN 12/11/2013  . Carpal tunnel syndrome   . Chronic kidney disease   . Coronary atherosclerosis of native coronary artery   . Disorder of mitral valve 12/11/2013  . Encounter for long-term (current) use of antiplatelets/antithrombotics   . Gout   . Hyperlipidemia   . Hypertension   . Lung nodule    found on xray in Aug 2018  . Mitral valve disorder   . Osteoarthritis   . Prostate cancer ([employer])     Medications:  Scheduled:  . amiodarone  400 mg Oral BID  . aspirin EC  325 mg Oral Daily  . atorvastatin  40 mg Oral Daily  . benzonatate  200 mg Oral TID  . docusate sodium  100 mg Oral BID  . feeding supplement (NEPRO CARB STEADY)  237 mL Oral TID BM  . finasteride  5 mg Oral Daily  . fluticasone  2 spray Each Nare Daily  . midodrine  5 mg Oral TID WC  . multivitamin  1 tablet Oral QHS  . pantoprazole  40 mg Oral Daily    Assessment: Patient admitted for CP, was admitted  prior for perm cath placement for dialysis. Patient also has h/o of afib was on eliquis for a few days, last dose was 04/29 then stopped. Patient now presents with CP w/ trops 0.25, EKG NSR. Patient is being started on heparin drip -- has a h/o supratherapeutic anti-Xa's @ 1100 units/hr last admission.  Goal of Therapy:  Anti-Xa level goal: 0.3 - 0.7 Monitor platelets by anticoagulation protocol: Yes   Plan:  HL supratherapeutic at 0.83. Will decrease rate to 750 units/hr. Recheck in 6 hours.  Ramond Dial, Pharm.D, BCPS Clinical Pharmacist  12/20/2017

## 2017-12-20 NOTE — ED Triage Notes (Signed)
Patient from home via [facility]. Reports chest tightness that started today. Reports mild SOB. Denies N/V. Patient recently started on hemodialysis and has catheter in right chest. Had dialysis treatment yesterday as scheduled. Alert and oriented upon arrival. MD at bedside.

## 2017-12-20 NOTE — Progress Notes (Signed)
Pre HD assessment    12/20/17 1108  Vital Signs  Temp 98 F (36.7 C)  Temp Source Oral  Pulse Rate 68  Pulse Rate Source Monitor  Resp 13  BP (!) 91/58  BP Location Left Arm  BP Method Automatic  Patient Position (if appropriate) Lying  Oxygen Therapy  SpO2 94 %  O2 Device Room Air  Pain Assessment  Pain Scale 0-10  Pain Score 0  Dialysis Weight  Weight 75.7 kg (166 lb 14.2 oz)  Type of Weight Pre-Dialysis  Time-Out for Hemodialysis  What Procedure? HD  Pt Identifiers(min of two) First/Last Name;MRN/Account#  Correct Site? Yes  Correct Side? Yes  Correct Procedure? Yes  Consents Verified? Yes  Rad Studies Available? N/A  Safety Precautions Reviewed? Yes  Engineer, civil (consulting) Number  ([id number])  Station Number 1  UF/Alarm Test Passed  Conductivity: Meter 13.8  Conductivity: Machine  14.1  pH 7.6  Reverse Osmosis main  Normal Saline Lot Number [id number]  Dialyzer Lot Number [id number]  Disposable Set Lot Number [id number]  Machine Temperature 98.6 F (37 C)  Musician and Audible Yes  Blood Lines Intact and Secured Yes  Pre Treatment Patient Checks  Vascular access used during treatment Catheter  Hepatitis B Surface Antigen Results Negative  Date Hepatitis B Surface Antigen Drawn 12/09/17  Hepatitis B Surface Antibody  (<10)  Date Hepatitis B Surface Antibody Drawn 12/09/17  Hemodialysis Consent Verified Yes  Hemodialysis Standing Orders Initiated Yes  ECG (Telemetry) Monitor On Yes  Prime Ordered Normal Saline  Length of  DialysisTreatment -hour(s) 3.5 Hour(s)  Dialyzer Elisio 17H NR  Dialysate 3K, 2.5 Ca  Dialysis Anticoagulant None  Dialysate Flow Ordered 800  Blood Flow Rate Ordered 400 mL/min  Ultrafiltration Goal 1.5 Liters  Pre Treatment Labs Phosphorus  Dialysis Blood Pressure Support Ordered Normal Saline  Education / Care Plan  Dialysis Education Provided Yes  Documented Education in Care Plan Yes

## 2017-12-20 NOTE — Progress Notes (Signed)
Pre HD assessment    12/20/17 1109  Neurological  Level of Consciousness Responds to Voice  Orientation Level Oriented X4  Respiratory  Respiratory Pattern Regular;Unlabored  Chest Assessment Chest expansion symmetrical  Cardiac  ECG Monitor Yes  Vascular  R Radial Pulse +2  L Radial Pulse +2  Edema Generalized  Integumentary  Integumentary (WDL) X  Skin Color Appropriate for ethnicity  Musculoskeletal  Musculoskeletal (WDL) X  Generalized Weakness Yes  Assistive Device None  GU Assessment  Genitourinary (WDL) X  Genitourinary Symptoms  (HD)  Psychosocial  Psychosocial (WDL) X  Patient Behaviors Cooperative;Calm;Flat affect

## 2017-12-20 NOTE — Progress Notes (Signed)
HD tx end   12/20/17 1455  Vital Signs  Pulse Rate 72  Pulse Rate Source Monitor  Resp 16  BP (!) 95/54  BP Location Left Arm  BP Method Automatic  Patient Position (if appropriate) Lying  Oxygen Therapy  SpO2 100 %  O2 Device Room Air  During Hemodialysis Assessment  Dialysis Fluid Bolus Normal Saline  Bolus Amount (mL) 250 mL  Intra-Hemodialysis Comments Tx completed

## 2017-12-21 DIAGNOSIS — I248 Other forms of acute ischemic heart disease: Secondary | ICD-10-CM | POA: Diagnosis not present

## 2017-12-21 DIAGNOSIS — R748 Abnormal levels of other serum enzymes: Secondary | ICD-10-CM | POA: Diagnosis not present

## 2017-12-21 LAB — CBC
HEMATOCRIT: 25.2 % — AB (ref 40.0–52.0)
Hemoglobin: 8 g/dL — ABNORMAL LOW (ref 13.0–18.0)
MCH: 22.2 pg — AB (ref 26.0–34.0)
MCHC: 31.6 g/dL — AB (ref 32.0–36.0)
MCV: 70.3 fL — ABNORMAL LOW (ref 80.0–100.0)
Platelets: 234 10*3/uL (ref 150–440)
RBC: 3.58 MIL/uL — ABNORMAL LOW (ref 4.40–5.90)
RDW: 21.8 % — AB (ref 11.5–14.5)
WBC: 10.4 10*3/uL (ref 3.8–10.6)

## 2017-12-21 LAB — BASIC METABOLIC PANEL
Anion gap: 8 (ref 5–15)
BUN: 26 mg/dL — AB (ref 6–20)
CALCIUM: 8.1 mg/dL — AB (ref 8.9–10.3)
CO2: 27 mmol/L (ref 22–32)
Chloride: 100 mmol/L — ABNORMAL LOW (ref 101–111)
Creatinine, Ser: 3.89 mg/dL — ABNORMAL HIGH (ref 0.61–1.24)
GFR calc Af Amer: 15 mL/min — ABNORMAL LOW (ref >60)
GFR calc non Af Amer: 13 mL/min — ABNORMAL LOW (ref >60)
GLUCOSE: 128 mg/dL — AB (ref 65–99)
Potassium: 3.7 mmol/L (ref 3.5–5.1)
Sodium: 135 mmol/L (ref 135–145)

## 2017-12-21 LAB — GLUCOSE, CAPILLARY: Glucose-Capillary: 87 mg/dL (ref 65–99)

## 2017-12-21 MED ORDER — AMIODARONE HCL 400 MG PO TABS: 200.0000 mg | ORAL_TABLET | Freq: Two times a day (BID) | ORAL | 0 refills | Status: DC

## 2017-12-21 NOTE — Progress Notes (Signed)
Per daughter, patient's H.H.C[facility] has already been set up, and she doesn't want to wait for me to hear back from our case manager that this service has indeed been set up. I proposed waiting about 15 minutes to hear back from the case manager, who I called and left a message with, before formally discharging the patient. Will wait about 10 more minutes for her to call me back, if not, will discharge patient upon daughter's request. Daron Offer

## 2017-12-21 NOTE — Discharge Instructions (Signed)

## 2017-12-21 NOTE — Progress Notes (Signed)
[facility] Cardiology [facility] Encounter Note  Patient: Andrew [name] / Admit Date: 12/20/2017 / Date of Encounter: 12/21/2017, 7:05 AM   Subjective: Patient is more attentive today and able to talk better.  No evidence of significant chest discomfort or worsening shortness of breath.  Patient appears to have tolerated dialysis relatively well.  Patient does have elevated troponin although consistent with kidney disease rather than acute coronary syndrome at peak of 0.25.  Patient still anemic likely contributing to current issues  Review of Systems: Positive for: Shortness of breath weakness Negative for: Vision change, hearing change, syncope, dizziness, nausea, vomiting,diarrhea, bloody stool, stomach pain, cough, congestion, diaphoresis, urinary frequency, urinary pain,skin lesions, skin rashes Others previously listed  Objective: Telemetry: Normal sinus rhythm Physical Exam: Blood pressure (!) 89/59, pulse 76, temperature 98 F (36.7 C), temperature source Oral, resp. rate 16, height 5\' 9"  (1.753 m), weight 159 lb 11.2 oz (72.4 kg), SpO2 97 %. Body mass index is 23.58 kg/m. General: Well developed, well nourished, in no acute distress. Head: Normocephalic, atraumatic, sclera non-icteric, no xanthomas, nares are without discharge. Neck: No apparent masses Lungs: Normal respirations with no wheezes, no rhonchi, few rales , no crackles   Heart: Regular rate and rhythm, normal S1 S2, 2-3+ apical murmur, no rub, no gallop, PMI is normal size and placement, carotid upstroke normal without bruit, jugular venous pressure normal Abdomen: Soft, non-tender, non-distended with normoactive bowel sounds. No hepatosplenomegaly. Abdominal aorta is normal size without bruit Extremities: 1-2+ edema, no clubbing, no cyanosis, no ulcers,  Peripheral: 2+ radial, 2+ femoral, 2+ dorsal pedal pulses Neuro: Alert and oriented. Moves all extremities spontaneously. Psych:  Responds to questions appropriately  with a normal affect.   Intake/Output Summary (Last 24 hours) at 12/21/2017 0705 Last data filed at 12/21/2017 0615 Gross per 24 hour  Intake -  Output 1833 ml  Net -1833 ml    Inpatient Medications:  . amiodarone  400 mg Oral BID  . aspirin EC  325 mg Oral Daily  . atorvastatin  40 mg Oral Daily  . bacitracin-polymyxin b   Left Eye QID  . benzonatate  200 mg Oral TID  . docusate sodium  100 mg Oral BID  . [START ON 12/23/2017] epoetin (EPOGEN/PROCRIT) injection  4,000 Units Intravenous Q M,W,F-HD  . feeding supplement (NEPRO CARB STEADY)  237 mL Oral TID BM  . finasteride  5 mg Oral Daily  . fluticasone  2 spray Each Nare Daily  . heparin injection (subcutaneous)  5,000 Units Subcutaneous Q8H  . midodrine  5 mg Oral TID WC  . multivitamin  1 tablet Oral QHS  . pantoprazole  40 mg Oral Daily   Infusions:   Labs: Recent Labs    12/20/17 0000 12/20/17 0409 12/20/17 1220  NA 136 137  --   K 3.6 3.2*  --   CL 98* 96*  --   CO2 27 28  --   GLUCOSE 123* 107*  --   BUN 37* 39*  --   CREATININE 4.67* 4.77*  --   CALCIUM 8.2* 8.1*  --   PHOS  --   --  3.9   Recent Labs    12/20/17 0000  AST 43*  ALT 47  ALKPHOS 181*  BILITOT 2.5*  PROT 5.6*  ALBUMIN 3.0*   Recent Labs    12/20/17 0000 12/20/17 0409  WBC 10.8* 9.7  HGB 8.3* 7.6*  HCT 25.1* 24.3*  MCV 69.7* 70.3*  PLT 249 214  Recent Labs    12/20/17 0000 12/20/17 0907 12/20/17 1220 12/20/17 2011  TROPONINI 0.25* 0.24* 0.23* 0.19*   Invalid input(s): POCBNP No results for input(s): HGBA1C in the last 72 hours.   Weights: Filed Weights   12/20/17 1505 12/20/17 1700 12/21/17 0430  Weight: 162 lb 11.2 oz (73.8 kg) 158 lb (71.7 kg) 159 lb 11.2 oz (72.4 kg)     Radiology/Studies:  Dg Chest 2 View  Result Date: 12/20/2017 CLINICAL DATA:  Chest tightness with mild dyspnea. EXAM: CHEST - 2 VIEW COMPARISON:  12/01/2017 FINDINGS: Stable cardiomegaly with moderate aortic atherosclerosis. New bilateral  small pleural effusions obscuring the diaphragms and spanning approximately of 1-1/2 to 2 vertebral body heights on the lateral view. New right IJ dialysis catheter tip terminates in the distal SVC. IMPRESSION: Cardiomegaly with new small bilateral pleural effusions. No overt pulmonary edema. New right IJ dialysis catheter is noted without apparent complication. Electronically Signed   By: Ashley Royalty M.D.   On: 12/20/2017 00:53   Dg Chest 2 View  Result Date: 12/01/2017 CLINICAL DATA:  Patient reports SOB, dizziness and bilateral LE swelling onset 2 days ago. Hx prostate ca, mitral valve disorder, lung nodule, HTN, cardiac stent placement. Non-smoker. EXAM: CHEST - 2 VIEW COMPARISON:  10/31/2017 FINDINGS: Heart is enlarged and stable in configuration. There no focal consolidations. Small RIGHT pleural effusion or pleural thickening persists and is smaller. No pulmonary edema. IMPRESSION: 1. Stable cardiomegaly. 2. Smaller RIGHT pleural effusion. Electronically Signed   By: Nolon Nations M.D.   On: 12/01/2017 11:06   US Renal  Result Date: 12/01/2017 CLINICAL DATA:  Renal failure, acute on chronic EXAM: RENAL / URINARY TRACT ULTRASOUND COMPLETE COMPARISON:  Ultrasound 04/09/2017.  MRI 05/28/2017 FINDINGS: Right Kidney: Length: 10.3 cm. Multiple cysts, the largest 7.2 cm in the lower pole with thin septations. No hydronephrosis. Diffusely increased echotexture Left Kidney: Length: 10.9 cm. Multiple cysts, the largest 15 mm in the midpole. Increased echotexture. No hydronephrosis. Bladder: Slight wall irregularity and thickening.  Prostate enlargement. IMPRESSION: No hydronephrosis. Increased echotexture in the kidneys bilaterally compatible chronic medical renal disease. Bilateral benign appearing renal cysts. Mild bladder wall thickening, likely related to prostate enlargement. Electronically Signed   By: Rolm Baptise M.D.   On: 12/01/2017 17:22   Mr Foot Left Wo Contrast  Result Date:  12/03/2017 CLINICAL DATA:  Left foot and toe pain over the past few weeks. EXAM: MRI OF THE LEFT FOOT WITHOUT CONTRAST TECHNIQUE: Multiplanar, multisequence MR imaging of the left mid and forefoot was performed. No intravenous contrast was administered. COMPARISON:  Same day great toe radiographs. FINDINGS: Bones/Joint/Cartilage Osteoarthritis of the interphalangeal, first MTP and TMT joints of the great toe. Associated subchondral degenerative cystic change is noted involving the base and head of the first metatarsal and across the interphalangeal joint of the great toe. Lesser degrees of degenerative joint space narrowing of the DIP and PIP joints of the second through fifth digits. Areas of minimal edema involving the tuft of the great toe and cortical destruction of the middle phalanx of the second toe with minimal edema raise concern for early changes of osteomyelitis versus erosive changes possibly from an inflammatory arthropathy. Ligaments Noncontributory Muscles and Tendons No intramuscular fluid collection or abscess. No significant muscle atrophy. No evidence of a tenosynovitis. The tarsal Soft tissues Soft tissue edema and swelling is noted along the dorsum of the mid and forefoot. No focal fluid collections are identified. IMPRESSION: 1. Osteoarthritis of the forefoot as above  more notably involving the interphalangeal, first MTP and TMT joints with joint space narrowing and subchondral cystic change. 2. Lesser degrees of osteoarthritic joint space narrowing is seen of the DIP and PIP joints of the second through fifth digits and fourth TMT joints. 3. Faint marrow edema involving the tuft of the great toe cannot exclude posttraumatic or reactive edema versus early changes of an osteomyelitis. 4. Cortical irregularity of the second middle phalangeal head with slight marrow edema also raise concern for changes of osteomyelitis although changes from inflammatory arthropathy may also have a similar  appearance. Correlate with dedicated foot and/or toe radiographs. 5. Soft tissue swelling over the dorsum of the included foot. Electronically Signed   By: Ashley Royalty M.D.   On: 12/03/2017 00:31   US Venous Img Lower Bilateral  Result Date: 12/02/2017 CLINICAL DATA:  Swelling in both legs, pain, history prostate cancer EXAM: BILATERAL LOWER EXTREMITY VENOUS DOPPLER ULTRASOUND TECHNIQUE: Gray-scale sonography with graded compression, as well as color Doppler and duplex ultrasound were performed to evaluate the lower extremity deep venous systems from the level of the common femoral vein and including the common femoral, femoral, profunda femoral, popliteal and calf veins including the posterior tibial, peroneal and gastrocnemius veins when visible. The superficial great saphenous vein was also interrogated. Spectral Doppler was utilized to evaluate flow at rest and with distal augmentation maneuvers in the common femoral, femoral and popliteal veins. COMPARISON:  None. FINDINGS: RIGHT LOWER EXTREMITY Common Femoral Vein: No evidence of thrombus. Normal compressibility, respiratory phasicity and response to augmentation. Saphenofemoral Junction: No evidence of thrombus. Normal compressibility and flow on color Doppler imaging. Profunda Femoral Vein: No evidence of thrombus. Normal compressibility and flow on color Doppler imaging. Femoral Vein: No evidence of thrombus. Normal compressibility, respiratory phasicity and response to augmentation. Popliteal Vein: No evidence of thrombus. Normal compressibility, respiratory phasicity and response to augmentation. Calf Veins: No evidence of thrombus. Normal compressibility and flow on color Doppler imaging. Superficial Great Saphenous Vein: No evidence of thrombus. Normal compressibility. Venous Reflux:  None. Other Findings:  None. LEFT LOWER EXTREMITY Common Femoral Vein: No evidence of thrombus. Normal compressibility, respiratory phasicity and response to  augmentation. Saphenofemoral Junction: No evidence of thrombus. Normal compressibility and flow on color Doppler imaging. Profunda Femoral Vein: No evidence of thrombus. Normal compressibility and flow on color Doppler imaging. Femoral Vein: No evidence of thrombus. Normal compressibility, respiratory phasicity and response to augmentation. Popliteal Vein: No evidence of thrombus. Normal compressibility, respiratory phasicity and response to augmentation. Calf Veins: No evidence of thrombus. Normal compressibility and flow on color Doppler imaging. Superficial Great Saphenous Vein: No evidence of thrombus. Normal compressibility. Venous Reflux:  None. Other Findings:  None. IMPRESSION: No evidence of deep venous thrombosis in either lower extremity. Electronically Signed   By: Lavonia Dana M.D.   On: 12/02/2017 14:15   Dg Toe Great Left  Result Date: 12/02/2017 CLINICAL DATA:  Left great toe pain swelling. EXAM: LEFT GREAT TOE COMPARISON:  None. FINDINGS: No acute osseous abnormality. Chondrocalcinosis is seen in the first metatarsophalangeal joint. IMPRESSION: Chondrocalcinosis in the first metatarsophalangeal joint can be seen with calcium pyrophosphate deposition disease. Electronically Signed   By: Lorin Picket M.D.   On: 12/02/2017 16:45     Assessment and Recommendation  84 y.o. male with known paroxysmal nonvalvular atrial fibrillation anginal hypertension mixed hyperlipidemia on dialysis with acute fine systolic and diastolic dysfunction congestive heart failure lightly improved without evidence of further chest discomfort or primary myocardial infarction  with significant contribution likely from anemia 1.  Continue supportive care and treatment of chronic kidney disease with dialysis following for worsening hypotension during this.  As well as any worsening chest discomfort needing further intervention 2.  No further intervention of chest pain and or stable troponin resolved and most consistent  with chronic kidney disease rather than acute coronary syndrome 3.  No further cardiac diagnostics necessary at this time 4.  Continue medication management for high intensity cholesterol therapy 5.  Amiodarone 4 maintenance of normal sinus rhythm and possible decrease dose to 200 mg 6.  Begin ambulation and follow for improvements of issues and possible discharged home from cardiac standpoint with follow-up next week for further adjustments  Signed, Serafina Royals M.D. FACC

## 2017-12-21 NOTE — Progress Notes (Signed)
Patient ambulated around the unit with Myriam Jacobson, NT using walker. Per her patient did fairly well. Will let Dr. Anselm Jungling know. Will continue to monitor. Wenda Low [facility]

## 2017-12-21 NOTE — Progress Notes (Signed)
[facility]  ROUNDING NOTE   Subjective:  Patient had hemodialysis yesterday. Cardiology has evaluated the patient. No further cardiac diagnostics at this time.   Objective:  Vital signs in last 24 hours:  Temp:  [97.4 F (36.3 C)-98 F (36.7 C)] 98 F (36.7 C) (05/11 0407) Pulse Rate:  [67-78] 76 (05/11 0917) Resp:  [11-27] 19 (05/11 0917) BP: (85-101)/(48-63) 95/63 (05/11 0917) SpO2:  [93 %-100 %] 95 % (05/11 0917) Weight:  [71.7 kg (158 lb)-73.8 kg (162 lb 11.2 oz)] 72.4 kg (159 lb 11.2 oz) (05/11 0430)  Weight change: 3.124 kg (6 lb 14.2 oz) Filed Weights   12/20/17 1505 12/20/17 1700 12/21/17 0430  Weight: 73.8 kg (162 lb 11.2 oz) 71.7 kg (158 lb) 72.4 kg (159 lb 11.2 oz)    Intake/Output: I/O last 3 completed shifts: In: -  Out: 2376 [Urine:300; Other:1533]   Intake/Output this shift:  Total I/O In: 240 [P.O.:240] Out: -   Physical Exam: General: NAD, laying in bed  Head: Normocephalic, atraumatic. Moist oral mucosal membranes  Eyes: Anicteric,   Neck: Supple, trachea midline  Lungs:  Clear to auscultation  Heart: No rub S1S2  Abdomen:  Soft, nontender, BS present  Extremities: 3+ peripheral edema.   Neurologic: Nonfocal, moving all four extremities  Skin: Warm, dry, no rash  Access:  Right internal jugular permcath    Basic Metabolic Panel: Recent Labs  Lab 12/15/17 1453  12/16/17 0524 12/16/17 1516 12/17/17 0718 12/20/17 0000 12/20/17 0409 12/20/17 1220 12/21/17 1051  NA  --   --  134*  --  136 136 137  --  135  K  --   --  3.9  --  3.8 3.6 3.2*  --  3.7  CL  --   --  94*  --  95* 98* 96*  --  100*  CO2  --   --  26  --  29 27 28   --  27  GLUCOSE  --   --  94  --  66 123* 107*  --  128*  BUN  --   --  64*  --  47* 37* 39*  --  26*  CREATININE  --   --  5.90*  --  4.76* 4.67* 4.77*  --  3.89*  CALCIUM  --    < > 8.5*  --  8.3* 8.2* 8.1*  --  8.1*  MG 2.1  --   --   --   --   --   --   --   --   PHOS  --   --   --  5.1*  --    --   --  3.9  --    < > = values in this interval not displayed.    Liver Function Tests: Recent Labs  Lab 12/20/17 0000  AST 43*  ALT 47  ALKPHOS 181*  BILITOT 2.5*  PROT 5.6*  ALBUMIN 3.0*   No results for input(s): LIPASE, AMYLASE in the last 168 hours. No results for input(s): AMMONIA in the last 168 hours.  CBC: Recent Labs  Lab 12/17/17 0718 12/20/17 0000 12/20/17 0409 12/21/17 1051  WBC 12.6* 10.8* 9.7 10.4  HGB 7.8* 8.3* 7.6* 8.0*  HCT 25.2* 25.1* 24.3* 25.2*  MCV 70.5* 69.7* 70.3* 70.3*  PLT 191 249 214 234    Cardiac Enzymes: Recent Labs  Lab 12/20/17 0000 12/20/17 0907 12/20/17 1220 12/20/17 2011  TROPONINI 0.25* 0.24* 0.23* 0.19*  BNP: Invalid input(s): POCBNP  CBG: Recent Labs  Lab 12/20/17 0734 12/20/17 1649 12/20/17 1717 12/20/17 1745 12/21/17 0750  GLUCAP 93 64* 64* 109* 33    Microbiology: Results for orders placed or performed during the hospital encounter of 12/01/17  MRSA PCR Screening     Status: None   Collection Time: 12/05/17  8:00 AM  Result Value Ref Range Status   MRSA by PCR NEGATIVE NEGATIVE Final    Comment:        The GeneXpert MRSA Assay (FDA approved for NASAL specimens only), is one component of a comprehensive MRSA colonization surveillance program. It is not intended to diagnose MRSA infection nor to guide or monitor treatment for MRSA infections. Performed at [facility], [address] [place], [place] [address]     Coagulation Studies: Recent Labs    12/20/17 0200  LABPROT 17.5*  INR 1.45    Urinalysis: No results for input(s): COLORURINE, LABSPEC, PHURINE, GLUCOSEU, HGBUR, BILIRUBINUR, KETONESUR, PROTEINUR, UROBILINOGEN, NITRITE, LEUKOCYTESUR in the last 72 hours.  Invalid input(s): APPERANCEUR    Imaging: Dg Chest 2 View  Result Date: 12/20/2017 CLINICAL DATA:  Chest tightness with mild dyspnea. EXAM: CHEST - 2 VIEW COMPARISON:  12/01/2017 FINDINGS: Stable cardiomegaly  with moderate aortic atherosclerosis. New bilateral small pleural effusions obscuring the diaphragms and spanning approximately of 1-1/2 to 2 vertebral body heights on the lateral view. New right IJ dialysis catheter tip terminates in the distal SVC. IMPRESSION: Cardiomegaly with new small bilateral pleural effusions. No overt pulmonary edema. New right IJ dialysis catheter is noted without apparent complication. Electronically Signed   By: Ashley Royalty M.D.   On: 12/20/2017 00:53     Medications:    . amiodarone  400 mg Oral BID  . aspirin EC  325 mg Oral Daily  . atorvastatin  40 mg Oral Daily  . bacitracin-polymyxin b   Left Eye QID  . benzonatate  200 mg Oral TID  . docusate sodium  100 mg Oral BID  . [START ON 12/23/2017] epoetin (EPOGEN/PROCRIT) injection  4,000 Units Intravenous Q M,W,F-HD  . feeding supplement (NEPRO CARB STEADY)  237 mL Oral TID BM  . finasteride  5 mg Oral Daily  . fluticasone  2 spray Each Nare Daily  . heparin injection (subcutaneous)  5,000 Units Subcutaneous Q8H  . midodrine  5 mg Oral TID WC  . multivitamin  1 tablet Oral QHS  . pantoprazole  40 mg Oral Daily   acetaminophen **OR** acetaminophen, bisacodyl, HYDROcodone-acetaminophen, ondansetron **OR** ondansetron (ZOFRAN) IV, sodium chloride, traZODone  Assessment/ Plan:  [name] is a 84 y.o. black male with hypertension, hyperlipidemia, gout, ? history of prostate cancer, coronary artery disease, who was admitted to [facility] on 12/01/2017    1. Acute renal failure with chronic kidney disease stage III with bland urinalysis. Baseline creatinine of 2.1, GFR of 37 on 10/15/17.  Acute renal failure secondary to acute cardiorenal syndrome, ATN from hypotensive event and then with overdiuresis -Patient did have hemodialysis yesterday.  No acute indication for dialysis today.  We will plan for dialysis again on Monday as an outpatient.  2.  Chest pain: No further cardiac diagnostic studies at this time.  He  has been seen by cardiology.  Appreciate Dr. Alveria Apley input.  3. LE edema, CAD s/p stent placement, A Fib, Severe MR, pulmonary hypertension - likely cause of his severe lower extremity edema -Patient still has considerable lower extremity edema.  We will plan for additional  ultrafiltration on Monday.  4.  H/o prostate cancer 2016-2017 Adenocarcinoma- Seen by Dr Erlene Quan in 07/2015- missed f/u since then Treated with Finasteride PSA is elevated.  Patient will need continued follow-up with urology.  5..  Anemia chronic kidney disease.  Hemoglobin was down to 8.0.  We will likely need to initiate the patient on Epogen as an outpatient as well.   LOS: 1 Bhavika Schnider 5/11/201912:42 PM

## 2017-12-25 NOTE — Discharge Summary (Signed)
[facility] [place] NAME: Andrew [name]    MR#:  [id number]  DATE OF BIRTH:  [birth year]  DATE OF ADMISSION:  12/20/2017 ADMITTING PHYSICIAN: Amelia Jo, MD  DATE OF DISCHARGE: 12/21/2017  3:00 PM  PRIMARY CARE PHYSICIAN: [name], MD    ADMISSION DIAGNOSIS:  Elevated troponin [R74.8] Chest pain, unspecified type [R07.9]  DISCHARGE DIAGNOSIS:  Active Problems:  Acute CAD ruled out.  SECONDARY DIAGNOSIS:   Past Medical History:  Diagnosis Date  . Arrhythmia    atrial fibrillation  . Arteriosclerosis of coronary artery 01/27/2014  . Benign essential HTN 12/11/2013  . Carpal tunnel syndrome   . Chronic kidney disease   . Coronary atherosclerosis of native coronary artery   . Disorder of mitral valve 12/11/2013  . Encounter for long-term (current) use of antiplatelets/antithrombotics   . Gout   . Hyperlipidemia   . Hypertension   . Lung nodule    found on xray in Aug 2018  . Mitral valve disorder   . Osteoarthritis   . Prostate cancer [facility])     HOSPITAL COURSE:   1.  Chest pain, ruled out ACS.   started patient on heparin drip and aspirin.  Continue to monitor on telemetry and follow the troponin levels- remained stable.  Cardiology is consulted for further evaluation and treatment.   suggested no acute CAD- no further work ups. Pt felt better after HD> 2. ESRD, newly diagnosed, recently started on hemodialysis.  Continue management per nephrology.  2times HD done in hospital. 3.  Anemia of chronic disease, hemoglobin level is stable at 8.3.  No active bleeding.  Continue to monitor hemoglobin level closely.  4.  Paroxysmal atrial fibrillation, rate controlled in SR. continue amiodarone.  Per family, patient is not on anticoagulation due to history of bleeding.   Cardio suggested change in amio dose. 5.  Hypotension, continue Midrin 3 times a day, per nephrology.  Avoid medications with potential of lowering the  blood pressure.   DISCHARGE CONDITIONS:   Stable.  CONSULTS OBTAINED:  Treatment Team:  Corey Skains, MD Anthonette Legato, MD  DRUG ALLERGIES:  No Known Allergies  DISCHARGE MEDICATIONS:   Allergies as of 12/21/2017   No Known Allergies     Medication List    TAKE these medications   amiodarone 400 MG tablet Commonly known as:  PACERONE Take 0.5 tablets (200 mg total) by mouth 2 (two) times daily. Take 400mg  oral twice a day for 1 week and then change to once a day after that What changed:  how much to take   aspirin EC 81 MG tablet Take 1 tablet (81 mg total) by mouth daily.   atorvastatin 40 MG tablet Commonly known as:  LIPITOR Take 40 mg by mouth daily.   benzonatate 200 MG capsule Commonly known as:  TESSALON Take 1 capsule (200 mg total) by mouth 3 (three) times daily.   feeding supplement (NEPRO CARB STEADY) Liqd Take 237 mLs by mouth 3 (three) times daily between meals.   finasteride 5 MG tablet Commonly known as:  PROSCAR Take 5 mg by mouth daily.   fluticasone 50 MCG/ACT nasal spray Commonly known as:  FLONASE Place 2 sprays into both nostrils daily.   guaiFENesin 600 MG 12 hr tablet Commonly known as:  MUCINEX Take 1 tablet (600 mg total) by mouth 2 (two) times daily.   midodrine 5 MG tablet Commonly known as:  PROAMATINE Take 1 tablet (5 mg  total) by mouth 3 (three) times daily with meals.   multivitamin Tabs tablet Take 1 tablet by mouth at bedtime.   omeprazole 20 MG capsule Commonly known as:  PRILOSEC Take 1 capsule (20 mg total) by mouth 2 (two) times daily.   sodium chloride 0.65 % Soln nasal spray Commonly known as:  OCEAN Place 1 spray into both nostrils as needed for congestion.        DISCHARGE INSTRUCTIONS:    Follow with PMD in 1-2 weeks.  If you experience worsening of your admission symptoms, develop shortness of breath, life threatening emergency, suicidal or homicidal thoughts you must seek medical attention  immediately by calling 911 or calling your MD immediately  if symptoms less severe.  You Must read complete instructions/literature along with all the possible adverse reactions/side effects for all the Medicines you take and that have been prescribed to you. Take any new Medicines after you have completely understood and accept all the possible adverse reactions/side effects.   Please note  You were cared for by a hospitalist during your hospital stay. If you have any questions about your discharge medications or the care you received while you were in the hospital after you are discharged, you can call the unit and asked to speak with the hospitalist on call if the hospitalist that took care of you is not available. Once you are discharged, your primary care physician will handle any further medical issues. Please note that NO REFILLS for any discharge medications will be authorized once you are discharged, as it is imperative that you return to your primary care physician (or establish a relationship with a primary care physician if you do not have one) for your aftercare needs so that they can reassess your need for medications and monitor your lab values.    Today   CHIEF COMPLAINT:   Chief Complaint  Patient presents with  . Chest Pain  . Shortness of Breath    HISTORY OF PRESENT ILLNESS:  Andrew [name]  is a 84 y.o. male with a known history of atrial fibrillation, hypertension, coronary artery disease, recently diagnosed end-stage renal disease, started on hemodialysis. Patient presented to emergency room for acute onset of central chest tightness, described as continuous, 7 out of 10 in severity, without any radiation.  Patient denies having similar episodes in the past.  His pain did not improve with nitroglycerin. He denies any nausea, vomiting, dizziness, lightheadedness.  Patient was recently discharged from the hospital, treated for new onset renal failure started on dialysis.   Blood test done emergency room show troponin level is 0.25.  Hemoglobin level is 8.3.  Chest x-ray and EKG are noted without any acute changes.    VITAL SIGNS:  Blood pressure 95/63, pulse 76, temperature 98 F (36.7 C), temperature source Oral, resp. rate 19, height 5\' 9"  (1.753 m), weight 72.4 kg (159 lb 11.2 oz), SpO2 95 %.  I/O:  No intake or output data in the 24 hours ending 12/25/17 0807  PHYSICAL EXAMINATION:   GENERAL:  84 y.o.-year-old patient lying in the bed with mild distress, secondary to chest pain.  EYES: Pupils equal, round, reactive to light and accommodation. No scleral icterus. Extraocular muscles intact.  HEENT: Head atraumatic, normocephalic. Oropharynx and nasopharynx clear.  NECK:  Supple, no jugular venous distention. No thyroid enlargement, no tenderness.  LUNGS: Reduced breath sounds bilaterally, no wheezing, rales,rhonchi or crepitation. No use of accessory muscles of respiration.  CARDIOVASCULAR: S1, S2 normal. No S3/S4.  ABDOMEN: Soft, nontender, nondistended. Bowel sounds present. No organomegaly or mass.  EXTREMITIES: No pedal edema, cyanosis, or clubbing.  NEUROLOGIC: Cranial nerves II through XII are intact. Muscle strength 5/5 in all extremities. Sensation intact.  PSYCHIATRIC: The patient is alert and oriented x 3.  SKIN: No obvious rash, lesion, or ulcer.      DATA REVIEW:   CBC Recent Labs  Lab 12/21/17 1051  WBC 10.4  HGB 8.0*  HCT 25.2*  PLT 234    Chemistries  Recent Labs  Lab 12/20/17 0000  12/21/17 1051  NA 136   < > 135  K 3.6   < > 3.7  CL 98*   < > 100*  CO2 27   < > 27  GLUCOSE 123*   < > 128*  BUN 37*   < > 26*  CREATININE 4.67*   < > 3.89*  CALCIUM 8.2*   < > 8.1*  AST 43*  --   --   ALT 47  --   --   ALKPHOS 181*  --   --   BILITOT 2.5*  --   --    < > = values in this interval not displayed.    Cardiac Enzymes Recent Labs  Lab 12/20/17 2011  TROPONINI 0.19*    Microbiology Results  Results for  orders placed or performed during the hospital encounter of 12/01/17  MRSA PCR Screening     Status: None   Collection Time: 12/05/17  8:00 AM  Result Value Ref Range Status   MRSA by PCR NEGATIVE NEGATIVE Final    Comment:        The GeneXpert MRSA Assay (FDA approved for NASAL specimens only), is one component of a comprehensive MRSA colonization surveillance program. It is not intended to diagnose MRSA infection nor to guide or monitor treatment for MRSA infections. Performed at [facility], [address] [place], [place] [address]     RADIOLOGY:  No results found.  EKG:   Orders placed or performed during the hospital encounter of 12/20/17  . EKG 12-Lead  . EKG 12-Lead  . EKG      Management plans discussed with the patient, family and they are in agreement.  CODE STATUS:  Code Status History    Date Active Date Inactive Code Status Order ID Comments User Context   12/20/2017 0645 12/21/2017 1922 Full Code [id number]  Arta Silence, MD Inpatient   12/20/2017 0308 12/20/2017 0645 DNR [id number]  Amelia Jo, MD ED   12/02/2017 1128 12/17/2017 1611 DNR [id number]  Max Sane, MD Inpatient   12/01/2017 1522 12/02/2017 1128 Full Code [id number]  Demetrios Loll, MD Inpatient   10/30/2017 2256 11/02/2017 1709 Full Code [id number]  Lance Coon, MD Inpatient   07/26/2017 1728 07/28/2017 1620 Full Code [id number]  Saundra Shelling, MD Inpatient   04/08/2017 1555 04/10/2017 1526 Full Code [id number]  Dustin Flock, MD Inpatient    Advance Directive Documentation     Most Recent Value  Type of Advance Directive  Living will  Pre-existing out of facility DNR order (yellow form or pink MOST form)  -  "MOST" Form in Place?  -      TOTAL TIME TAKING CARE OF THIS PATIENT: 35 minutes.    Vaughan Basta M.D on 12/25/2017 at 8:07 AM  Between 7am to 6pm - Pager - [phone]  After 6pm go to [email or web address] - password [email or web address] [facility]  Office   [phone]  CC: Primary care  physician; [name], MD   Note: This dictation was prepared with Dragon dictation along with smaller phrase technology. Any transcriptional errors that result from this process are unintentional.

## 2017-12-26 ENCOUNTER — Other Ambulatory Visit: Payer: Self-pay

## 2017-12-26 ENCOUNTER — Inpatient Hospital Stay
Admission: EM | Admit: 2017-12-26 | Discharge: 2017-12-29 | DRG: 291 | Disposition: A | Discharge status: Home Health | Payer: Medicare Other | Attending: Internal Medicine | Admitting: Internal Medicine

## 2017-12-26 ENCOUNTER — Emergency Department: Payer: Medicare Other

## 2017-12-26 DIAGNOSIS — N186 End stage renal disease: Secondary | ICD-10-CM | POA: Diagnosis present

## 2017-12-26 DIAGNOSIS — Z7982 Long term (current) use of aspirin: Secondary | ICD-10-CM

## 2017-12-26 DIAGNOSIS — R0602 Shortness of breath: Secondary | ICD-10-CM

## 2017-12-26 DIAGNOSIS — Z7951 Long term (current) use of inhaled steroids: Secondary | ICD-10-CM

## 2017-12-26 DIAGNOSIS — N179 Acute kidney failure, unspecified: Secondary | ICD-10-CM | POA: Diagnosis present

## 2017-12-26 DIAGNOSIS — J189 Pneumonia, unspecified organism: Secondary | ICD-10-CM | POA: Diagnosis present

## 2017-12-26 DIAGNOSIS — J9601 Acute respiratory failure with hypoxia: Secondary | ICD-10-CM | POA: Diagnosis present

## 2017-12-26 DIAGNOSIS — I2510 Atherosclerotic heart disease of native coronary artery without angina pectoris: Secondary | ICD-10-CM | POA: Diagnosis present

## 2017-12-26 DIAGNOSIS — J40 Bronchitis, not specified as acute or chronic: Secondary | ICD-10-CM | POA: Diagnosis present

## 2017-12-26 DIAGNOSIS — I248 Other forms of acute ischemic heart disease: Secondary | ICD-10-CM | POA: Diagnosis present

## 2017-12-26 DIAGNOSIS — I5043 Acute on chronic combined systolic (congestive) and diastolic (congestive) heart failure: Secondary | ICD-10-CM | POA: Diagnosis present

## 2017-12-26 DIAGNOSIS — Z992 Dependence on renal dialysis: Secondary | ICD-10-CM

## 2017-12-26 DIAGNOSIS — E785 Hyperlipidemia, unspecified: Secondary | ICD-10-CM | POA: Diagnosis present

## 2017-12-26 DIAGNOSIS — I509 Heart failure, unspecified: Secondary | ICD-10-CM

## 2017-12-26 DIAGNOSIS — Z8546 Personal history of malignant neoplasm of prostate: Secondary | ICD-10-CM

## 2017-12-26 DIAGNOSIS — G5600 Carpal tunnel syndrome, unspecified upper limb: Secondary | ICD-10-CM | POA: Diagnosis present

## 2017-12-26 DIAGNOSIS — E8770 Fluid overload, unspecified: Secondary | ICD-10-CM | POA: Diagnosis present

## 2017-12-26 DIAGNOSIS — R748 Abnormal levels of other serum enzymes: Secondary | ICD-10-CM | POA: Diagnosis present

## 2017-12-26 DIAGNOSIS — N2581 Secondary hyperparathyroidism of renal origin: Secondary | ICD-10-CM | POA: Diagnosis present

## 2017-12-26 DIAGNOSIS — D631 Anemia in chronic kidney disease: Secondary | ICD-10-CM | POA: Diagnosis present

## 2017-12-26 DIAGNOSIS — M109 Gout, unspecified: Secondary | ICD-10-CM | POA: Diagnosis present

## 2017-12-26 DIAGNOSIS — I059 Rheumatic mitral valve disease, unspecified: Secondary | ICD-10-CM | POA: Diagnosis present

## 2017-12-26 DIAGNOSIS — M1990 Unspecified osteoarthritis, unspecified site: Secondary | ICD-10-CM | POA: Diagnosis present

## 2017-12-26 DIAGNOSIS — I959 Hypotension, unspecified: Secondary | ICD-10-CM | POA: Diagnosis present

## 2017-12-26 DIAGNOSIS — I482 Chronic atrial fibrillation: Secondary | ICD-10-CM | POA: Diagnosis present

## 2017-12-26 DIAGNOSIS — I480 Paroxysmal atrial fibrillation: Secondary | ICD-10-CM | POA: Diagnosis present

## 2017-12-26 DIAGNOSIS — I132 Hypertensive heart and chronic kidney disease with heart failure and with stage 5 chronic kidney disease, or end stage renal disease: Secondary | ICD-10-CM | POA: Diagnosis not present

## 2017-12-26 DIAGNOSIS — Z79899 Other long term (current) drug therapy: Secondary | ICD-10-CM

## 2017-12-26 LAB — PATHOLOGIST SMEAR REVIEW

## 2017-12-26 LAB — COMPREHENSIVE METABOLIC PANEL
ALK PHOS: 194 U/L — AB (ref 38–126)
ALT: 52 U/L (ref 17–63)
ANION GAP: 13 (ref 5–15)
AST: 68 U/L — ABNORMAL HIGH (ref 15–41)
Albumin: 3.1 g/dL — ABNORMAL LOW (ref 3.5–5.0)
BILIRUBIN TOTAL: 3 mg/dL — AB (ref 0.3–1.2)
BUN: 25 mg/dL — ABNORMAL HIGH (ref 6–20)
CALCIUM: 8.6 mg/dL — AB (ref 8.9–10.3)
CO2: 24 mmol/L (ref 22–32)
Chloride: 98 mmol/L — ABNORMAL LOW (ref 101–111)
Creatinine, Ser: 3.85 mg/dL — ABNORMAL HIGH (ref 0.61–1.24)
GFR calc non Af Amer: 13 mL/min — ABNORMAL LOW (ref >60)
GFR, EST AFRICAN AMERICAN: 15 mL/min — AB (ref >60)
Glucose, Bld: 127 mg/dL — ABNORMAL HIGH (ref 65–99)
Potassium: 3.5 mmol/L (ref 3.5–5.1)
SODIUM: 135 mmol/L (ref 135–145)
TOTAL PROTEIN: 6.2 g/dL — AB (ref 6.5–8.1)

## 2017-12-26 LAB — CBC WITH DIFFERENTIAL/PLATELET
BASOS ABS: 0 10*3/uL (ref 0–0.1)
Band Neutrophils: 0 %
Basophils Relative: 0 %
Blasts: 0 %
EOS PCT: 1 %
Eosinophils Absolute: 0.1 10*3/uL (ref 0–0.7)
HCT: 27.5 % — ABNORMAL LOW (ref 40.0–52.0)
Hemoglobin: 8.6 g/dL — ABNORMAL LOW (ref 13.0–18.0)
LYMPHS ABS: 1.2 10*3/uL (ref 1.0–3.6)
Lymphocytes Relative: 14 %
MCH: 22.1 pg — AB (ref 26.0–34.0)
MCHC: 31.1 g/dL — AB (ref 32.0–36.0)
MCV: 71 fL — AB (ref 80.0–100.0)
METAMYELOCYTES PCT: 0 %
Monocytes Absolute: 1 10*3/uL (ref 0.2–1.0)
Monocytes Relative: 11 %
Myelocytes: 0 %
NEUTROS ABS: 6.5 10*3/uL (ref 1.4–6.5)
Neutrophils Relative %: 74 %
Other: 0 %
PLATELETS: 281 10*3/uL (ref 150–440)
Promyelocytes Relative: 0 %
RBC: 3.87 MIL/uL — AB (ref 4.40–5.90)
RDW: 23 % — AB (ref 11.5–14.5)
WBC: 8.8 10*3/uL (ref 3.8–10.6)
nRBC: 3 /100 WBC — ABNORMAL HIGH

## 2017-12-26 LAB — PHOSPHORUS: PHOSPHORUS: 3.4 mg/dL (ref 2.5–4.6)

## 2017-12-26 LAB — CREATININE, SERUM
Creatinine, Ser: 3.93 mg/dL — ABNORMAL HIGH (ref 0.61–1.24)
GFR calc Af Amer: 15 mL/min — ABNORMAL LOW (ref >60)
GFR calc non Af Amer: 13 mL/min — ABNORMAL LOW (ref >60)

## 2017-12-26 LAB — MAGNESIUM: Magnesium: 2 mg/dL (ref 1.7–2.4)

## 2017-12-26 LAB — BRAIN NATRIURETIC PEPTIDE: B Natriuretic Peptide: >4500 pg/mL — ABNORMAL HIGH (ref 0.0–100.0)

## 2017-12-26 LAB — TROPONIN I: Troponin I: 0.16 ng/mL (ref <0.03)

## 2017-12-26 MED ORDER — ALBUTEROL SULFATE (2.5 MG/3ML) 0.083% IN NEBU: 2.5000 mg | INHALATION_SOLUTION | RESPIRATORY_TRACT | Status: DC | PRN

## 2017-12-26 MED ORDER — AMIODARONE HCL 200 MG PO TABS
400.0000 mg | ORAL_TABLET | Freq: Every day | ORAL | Status: DC
  Administered 2017-12-27 – 2017-12-29 (×4): 400 mg via ORAL
  Filled 2017-12-26 (×4): qty 2

## 2017-12-26 MED ORDER — SALINE SPRAY 0.65 % NA SOLN
1.0000 | NASAL | Status: DC | PRN
  Filled 2017-12-26: qty 44

## 2017-12-26 MED ORDER — SODIUM CHLORIDE 0.9% FLUSH: 3.0000 mL | INTRAVENOUS | Status: DC | PRN

## 2017-12-26 MED ORDER — HEPARIN SODIUM (PORCINE) 5000 UNIT/ML IJ SOLN
5000.0000 [IU] | Freq: Three times a day (TID) | INTRAMUSCULAR | Status: DC
  Administered 2017-12-26 – 2017-12-29 (×7): 5000 [IU] via SUBCUTANEOUS
  Filled 2017-12-26 (×8): qty 1

## 2017-12-26 MED ORDER — ACETAMINOPHEN 325 MG PO TABS
650.0000 mg | ORAL_TABLET | Freq: Four times a day (QID) | ORAL | Status: DC | PRN
Start: 2017-12-26 — End: 2017-12-29
  Administered 2017-12-29: 650 mg via ORAL
  Filled 2017-12-26: qty 2

## 2017-12-26 MED ORDER — PANTOPRAZOLE SODIUM 40 MG PO TBEC
40.0000 mg | DELAYED_RELEASE_TABLET | Freq: Every day | ORAL | Status: DC
  Administered 2017-12-27 – 2017-12-29 (×3): 40 mg via ORAL
  Filled 2017-12-26 (×3): qty 1

## 2017-12-26 MED ORDER — ONDANSETRON HCL 4 MG/2ML IJ SOLN: 4.0000 mg | Freq: Four times a day (QID) | INTRAMUSCULAR | Status: DC | PRN

## 2017-12-26 MED ORDER — MIDODRINE HCL 5 MG PO TABS
5.0000 mg | ORAL_TABLET | Freq: Three times a day (TID) | ORAL | Status: DC
  Administered 2017-12-27 – 2017-12-28 (×5): 5 mg via ORAL
  Filled 2017-12-26 (×6): qty 1

## 2017-12-26 MED ORDER — BENZONATATE 100 MG PO CAPS
200.0000 mg | ORAL_CAPSULE | Freq: Three times a day (TID) | ORAL | Status: DC
  Administered 2017-12-26 – 2017-12-29 (×7): 200 mg via ORAL
  Filled 2017-12-26 (×7): qty 2

## 2017-12-26 MED ORDER — GUAIFENESIN ER 600 MG PO TB12
600.0000 mg | ORAL_TABLET | Freq: Two times a day (BID) | ORAL | Status: DC
  Administered 2017-12-26 – 2017-12-29 (×5): 600 mg via ORAL
  Filled 2017-12-26 (×5): qty 1

## 2017-12-26 MED ORDER — SODIUM CHLORIDE 0.9 % IV SOLN: 250.0000 mL | INTRAVENOUS | Status: DC | PRN

## 2017-12-26 MED ORDER — HYDROCODONE-ACETAMINOPHEN 5-325 MG PO TABS: 1.0000 | ORAL_TABLET | ORAL | Status: DC | PRN

## 2017-12-26 MED ORDER — SODIUM CHLORIDE 0.9 % IV BOLUS
250.0000 mL | Freq: Once | INTRAVENOUS | Status: AC
  Administered 2017-12-26: 250 mL via INTRAVENOUS

## 2017-12-26 MED ORDER — ATORVASTATIN CALCIUM 20 MG PO TABS
40.0000 mg | ORAL_TABLET | Freq: Every day | ORAL | Status: DC
  Administered 2017-12-27 – 2017-12-29 (×3): 40 mg via ORAL
  Filled 2017-12-26 (×3): qty 2

## 2017-12-26 MED ORDER — ONDANSETRON HCL 4 MG PO TABS: 4.0000 mg | ORAL_TABLET | Freq: Four times a day (QID) | ORAL | Status: DC | PRN

## 2017-12-26 MED ORDER — ACETAMINOPHEN 650 MG RE SUPP
650.0000 mg | Freq: Four times a day (QID) | RECTAL | Status: DC | PRN
Start: 2017-12-26 — End: 2017-12-29

## 2017-12-26 MED ORDER — SODIUM CHLORIDE 0.9% FLUSH
3.0000 mL | Freq: Two times a day (BID) | INTRAVENOUS | Status: DC
  Administered 2017-12-26 – 2017-12-29 (×5): 3 mL via INTRAVENOUS

## 2017-12-26 MED ORDER — BISACODYL 5 MG PO TBEC
5.0000 mg | DELAYED_RELEASE_TABLET | Freq: Every day | ORAL | Status: DC | PRN
  Administered 2017-12-28: 5 mg via ORAL
  Filled 2017-12-26: qty 1

## 2017-12-26 MED ORDER — FLUTICASONE PROPIONATE 50 MCG/ACT NA SUSP
2.0000 | Freq: Every day | NASAL | Status: DC
  Administered 2017-12-28 – 2017-12-29 (×2): 2 via NASAL
  Filled 2017-12-26: qty 16

## 2017-12-26 MED ORDER — SENNOSIDES-DOCUSATE SODIUM 8.6-50 MG PO TABS
1.0000 | ORAL_TABLET | Freq: Every evening | ORAL | Status: DC | PRN
  Administered 2017-12-28 – 2017-12-29 (×2): 1 via ORAL
  Filled 2017-12-26 (×2): qty 1

## 2017-12-26 MED ORDER — NEPRO/CARBSTEADY PO LIQD
237.0000 mL | Freq: Three times a day (TID) | ORAL | Status: DC
  Administered 2017-12-26 – 2017-12-28 (×4): 237 mL via ORAL

## 2017-12-26 MED ORDER — FINASTERIDE 5 MG PO TABS
5.0000 mg | ORAL_TABLET | Freq: Every day | ORAL | Status: DC
  Administered 2017-12-27 – 2017-12-29 (×3): 5 mg via ORAL
  Filled 2017-12-26 (×3): qty 1

## 2017-12-26 MED ORDER — RENA-VITE PO TABS
1.0000 | ORAL_TABLET | Freq: Every day | ORAL | Status: DC
  Administered 2017-12-26 – 2017-12-28 (×3): 1 via ORAL
  Filled 2017-12-26 (×3): qty 1

## 2017-12-26 MED ORDER — ASPIRIN EC 81 MG PO TBEC
81.0000 mg | DELAYED_RELEASE_TABLET | Freq: Every day | ORAL | Status: DC
  Administered 2017-12-27 – 2017-12-29 (×3): 81 mg via ORAL
  Filled 2017-12-26 (×3): qty 1

## 2017-12-26 NOTE — ED Triage Notes (Signed)
Pt to the er via ems for sob. Pt has a hx of CHF, lung sounds dimished. Low sats on site. FD placed on O2. Sats now 100% on 2L. Pt is a dialysis pt who was dialysed yesterday. Access in right chest. No fever. Pt says he gets SOb with ambulation. No chest pain. Pt reports weakness. Symptoms started at 1400.

## 2017-12-26 NOTE — Progress Notes (Signed)
Advanced Care Plan.  Purpose of Encounter: CODE STATUS. Parties in Attendance: The patient, his son and daughter, and me. Patient's Decisional Capacity: Not sure. Medical Story: Andrew [name]  is a 84 y.o. male with a known history of ESRD on hemodialysis since last month, CAD, hypertension, hyperlipidemia, hypotension on Midodrin, prostate cancer and A. fib etc.  Has had shortness of breath and cough for 2 days.    He is being admitted for acute respiratory failure with hypoxia due to fluid overload.  Discussed the patient condition, poor prognosis and CODE STATUS.  The patient was seen DNR status and then changed to full code during last hospitalization.  The patient is AAO x2 and want to be resuscitated and intubated if necessary.  His son and daughter stated that the patient is full code.  Plan:  Code Status: Full code Time spent discussing advance care planning: 18 minutes.

## 2017-12-26 NOTE — ED Notes (Signed)
Family at bedside. 

## 2017-12-26 NOTE — ED Provider Notes (Signed)
[facility] Emergency Department Provider Note  ___________________________________________   First MD Initiated Contact with Patient 12/26/17 1514     (approximate)  I have reviewed the triage vital signs and the nursing notes.   HISTORY  Chief Complaint Shortness of Breath   HPI Andrew [name] is a 84 y.o. male history of atrial for ablation as well as end-stage renal disease on dialysis was presented to the emergency department today with shortness of breath over the past 2 days.  EMS reports that he was 70% on room air upon arrival of fire rescue.  He has been on nasal cannula oxygen since he was found to be hypoxic and he has since been in the 90s to 100%.  Patient denies any chest pain or pressure.  Says he was last dialyzed yesterday.  Says that he urinates about once per day.  However, says that he only urinates a small amount.  Says that the shortness of breath is worse when he lays back.  Does not report any cough or fever.  Bilateral leg swelling which is chronic.  Past Medical History:  Diagnosis Date  . Arrhythmia    atrial fibrillation  . Arteriosclerosis of coronary artery 01/27/2014  . Benign essential HTN 12/11/2013  . Carpal tunnel syndrome   . Chronic kidney disease   . Coronary atherosclerosis of native coronary artery   . Disorder of mitral valve 12/11/2013  . Encounter for long-term (current) use of antiplatelets/antithrombotics   . Gout   . Hyperlipidemia   . Hypertension   . Lung nodule    found on xray in Aug 2018  . Mitral valve disorder   . Osteoarthritis   . Prostate cancer [facility])     Patient Active Problem List   Diagnosis Date Noted  . NSTEMI (non-ST elevated myocardial infarction) ([place]) 12/20/2017  . ESRD (end stage renal disease) ([place]) 12/19/2017  . Atrial fibrillation ([place]) 12/19/2017  . Pressure injury of skin 12/02/2017  . Renal failure (ARF), acute on chronic (HCC) 12/01/2017  . Unstable angina ([place]) 10/31/2017  .  Elevated troponin 10/30/2017  . Chronic systolic CHF (congestive heart failure) ([place]) 10/30/2017  . Acute posthemorrhagic anemia   . GERD (gastroesophageal reflux disease)   . PAD (peripheral artery disease) ([place]) 06/04/2016  . Colon polyp 06/29/2015  . Malignant neoplasm of prostate ([place]) 11/14/2014  . HLD (hyperlipidemia) 07/29/2014  . Long term current use of antithrombotics/antiplatelets 01/27/2014  . Arteriosclerosis of coronary artery 01/27/2014  . Carpal tunnel syndrome 12/11/2013  . Benign essential HTN 12/11/2013  . Lesion of ulnar nerve 12/11/2013  . Disorder of mitral valve 12/11/2013  . Arthritis, degenerative 12/11/2013  . Hereditary and idiopathic neuropathy 12/11/2013    Past Surgical History:  Procedure Laterality Date  . COLONOSCOPY    . COLONOSCOPY WITH PROPOFOL N/A 07/23/2016   Procedure: COLONOSCOPY WITH PROPOFOL;  Surgeon: Manya Silvas, MD;  Location: [facility] ENDOSCOPY;  Service: Endoscopy;  Laterality: N/A;  . CORONARY ANGIOPLASTY WITH STENT PLACEMENT    . DIALYSIS/PERMA CATHETER INSERTION N/A 12/09/2017   Procedure: DIALYSIS/PERMA CATHETER INSERTION;  Surgeon: Algernon Huxley, MD;  Location: [facility] CV LAB;  Service: Cardiovascular;  Laterality: N/A;  . DIALYSIS/PERMA CATHETER INSERTION N/A 12/16/2017   Procedure: DIALYSIS/PERMA CATHETER INSERTION;  Surgeon: Algernon Huxley, MD;  Location: [place] CV LAB;  Service: Cardiovascular;  Laterality: N/A;  . ESOPHAGOGASTRODUODENOSCOPY Left 07/28/2017   Procedure: ESOPHAGOGASTRODUODENOSCOPY (EGD);  Surgeon: Virgel Manifold, MD;  Location: [facility] ENDOSCOPY;  Service: Endoscopy;  Laterality: Left;  . ESOPHAGOGASTRODUODENOSCOPY (EGD) WITH PROPOFOL N/A 07/23/2016   Procedure: ESOPHAGOGASTRODUODENOSCOPY (EGD) WITH PROPOFOL;  Surgeon: Manya Silvas, MD;  Location: [facility] ENDOSCOPY;  Service: Endoscopy;  Laterality: N/A;  . PROSTATE BIOPSY      Prior to Admission medications   Medication Sig Start Date End Date  Taking? Authorizing Provider  amiodarone (PACERONE) 400 MG tablet Take 0.5 tablets (200 mg total) by mouth 2 (two) times daily. Take 400mg  oral twice a day for 1 week and then change to once a day after that Patient taking differently: Take 400 mg by mouth daily.  12/21/17  Yes Vaughan Basta, MD  aspirin EC 81 MG tablet Take 1 tablet (81 mg total) by mouth daily. 12/17/17 03/17/18 Yes Gladstone Lighter, MD  atorvastatin (LIPITOR) 40 MG tablet Take 40 mg by mouth daily. 04/24/15  Yes [provider]  benzonatate (TESSALON) 200 MG capsule Take 1 capsule (200 mg total) by mouth 3 (three) times daily. 12/17/17  Yes Gladstone Lighter, MD  finasteride (PROSCAR) 5 MG tablet Take 5 mg by mouth daily. 04/13/15  Yes [provider]  fluticasone (FLONASE) 50 MCG/ACT nasal spray Place 2 sprays into both nostrils daily. 12/18/17  Yes Gladstone Lighter, MD  guaiFENesin (MUCINEX) 600 MG 12 hr tablet Take 1 tablet (600 mg total) by mouth 2 (two) times daily. 12/17/17  Yes Gladstone Lighter, MD  midodrine (PROAMATINE) 5 MG tablet Take 1 tablet (5 mg total) by mouth 3 (three) times daily with meals. 12/17/17  Yes Gladstone Lighter, MD  multivitamin (RENA-VIT) TABS tablet Take 1 tablet by mouth at bedtime. 12/17/17  Yes Gladstone Lighter, MD  Nutritional Supplements (FEEDING SUPPLEMENT, NEPRO CARB STEADY,) LIQD Take 237 mLs by mouth 3 (three) times daily between meals. 12/17/17 01/16/18 Yes Gladstone Lighter, MD  omeprazole (PRILOSEC) 20 MG capsule Take 1 capsule (20 mg total) by mouth 2 (two) times daily. 07/28/17 07/28/18 Yes Mody, Ulice Bold, MD  sodium chloride (OCEAN) 0.65 % SOLN nasal spray Place 1 spray into both nostrils as needed for congestion. 12/17/17  Yes Gladstone Lighter, MD    Allergies Patient has no known allergies.  Family History  Problem Relation Age of Onset  . Cancer Mother        stomach and uterus  . Cancer Sister        breast    Social History Social History   Tobacco Use   . Smoking status: Never Smoker  . Smokeless tobacco: Never Used  Substance Use Topics  . Alcohol use: No  . Drug use: No    Review of Systems  Constitutional: No fever/chills Eyes: No visual changes. ENT: No sore throat. Cardiovascular: Denies chest pain. Respiratory: As above Gastrointestinal: No abdominal pain.  No nausea, no vomiting.  No diarrhea.  No constipation. Genitourinary: Negative for dysuria. Musculoskeletal: Negative for back pain. Skin: Negative for rash. Neurological: Negative for headaches, focal weakness or numbness. ____________________________________________   PHYSICAL EXAM:  VITAL SIGNS: ED Triage Vitals  Enc Vitals Group     BP 12/26/17 1523 (!) 83/63     Pulse Rate 12/26/17 1523 80     Resp --      Temp 12/26/17 1523 97.6 F (36.4 C)     Temp Source 12/26/17 1523 Oral     SpO2 12/26/17 1523 100 %     Weight 12/26/17 1534 162 lb (73.5 kg)     Height 12/26/17 1534 5\' 9"  (1.753 m)     Head Circumference --  Peak Flow --      Pain Score 12/26/17 1534 0     Pain Loc --      Pain Edu? --      Excl. in [place]? --    Constitutional: Alert and oriented. Well appearing and in no acute distress. Eyes: Conjunctivae are normal.  Head: Atraumatic. Nose: No congestion/rhinnorhea. Mouth/Throat: Mucous membranes are moist.  Neck: No stridor.   Cardiovascular: Normal rate, regular rhythm. Grossly normal heart sounds.  Right-sided permacath with dressings that are clean dry and intact. Respiratory: Normal respiratory effort.  No retractions. Lungs CTAB. Gastrointestinal: Soft and nontender. No distention. No CVA tenderness. Musculoskeletal: Moderate to severe bilateral lower extremity edema. Neurologic:  Normal speech and language. No gross focal neurologic deficits are appreciated. Skin:  Skin is warm, dry and intact. No rash noted. Psychiatric: Mood and affect are normal. Speech and behavior are normal.  ____________________________________________     LABS (all labs ordered are listed, but only abnormal results are displayed)  Labs Reviewed  CBC WITH DIFFERENTIAL/PLATELET - Abnormal; Notable for the following components:      Result Value   RBC 3.87 (*)    Hemoglobin 8.6 (*)    HCT 27.5 (*)    MCV 71.0 (*)    MCH 22.1 (*)    MCHC 31.1 (*)    RDW 23.0 (*)    nRBC 3 (*)    All other components within normal limits  BRAIN NATRIURETIC PEPTIDE - Abnormal; Notable for the following components:   B Natriuretic Peptide >4,500.0 (*)    All other components within normal limits  TROPONIN I - Abnormal; Notable for the following components:   Troponin I 0.16 (*)    All other components within normal limits  COMPREHENSIVE METABOLIC PANEL - Abnormal; Notable for the following components:   Chloride 98 (*)    Glucose, Bld 127 (*)    BUN 25 (*)    Creatinine, Ser 3.85 (*)    Calcium 8.6 (*)    Total Protein 6.2 (*)    Albumin 3.1 (*)    AST 68 (*)    Alkaline Phosphatase 194 (*)    Total Bilirubin 3.0 (*)    GFR calc non Af Amer 13 (*)    GFR calc Af Amer 15 (*)    All other components within normal limits  PATHOLOGIST SMEAR REVIEW   ____________________________________________  EKG  ED ECG REPORT I, Doran Stabler, the attending physician, personally viewed and interpreted this ECG.   Date: 12/26/2017  EKG Time: 1521  Rate: 81  Rhythm: normal sinus rhythm  Axis: Normal  Intervals:nonspecific intraventricular conduction delay  ST&T Change: No ST segment elevation or depression.  T wave inversions in V4 through 6.  Also inversions in 2 and aVF.   ____________________________________________  RADIOLOGY  Overall stable appearance of the chest with bilateral pleural effusions and lower lobe infiltrate/atelectasis. ____________________________________________   PROCEDURES  Procedure(s) performed:   Procedures  Critical Care performed:   ____________________________________________   INITIAL IMPRESSION /  ASSESSMENT AND PLAN / ED COURSE  Pertinent labs & imaging results that were available during my care of the patient were reviewed by me and considered in my medical decision making (see chart for details).  Differential diagnosis includes, but is not limited to, ACS, aortic dissection, pulmonary embolism, cardiac tamponade, pneumothorax, pneumonia, pericarditis, myocarditis, GI-related causes including esophagitis/gastritis, and musculoskeletal chest wall pain.   As part of my medical decision making, I reviewed the following data within  the electronic MEDICAL RECORD NUMBER Notes from prior ED visits  ----------------------------------------- 6:56 PM on 12/26/2017 -----------------------------------------  Patient at this time is asymptomatic.  However, says that if he were to get up and walk that he would be short of breath.  Blood pressure now in the 90s which is approximately the patient's baseline.  Discussed case Dr. Juleen China who agrees with admission to the hospital because of what appears to be low blood pressure, even for this patient's baseline.  Elevated BNP greater than on May 10 when was last checked.  Patient explained need for admission to the hospital and is understanding and willing to comply. ____________________________________________   FINAL CLINICAL IMPRESSION(S) / ED DIAGNOSES  Pulmonary edema.  Peripheral edema.  Shortness of breath.    NEW MEDICATIONS STARTED DURING THIS VISIT:  New Prescriptions   No medications on file     Note:  This document was prepared using Dragon voice recognition software and may include unintentional dictation errors.     Orbie Pyo, MD 12/26/17 [phone]

## 2017-12-26 NOTE — H&P (Signed)
[facility] NAME: Andrew [name]    MR#:  [id number]  DATE OF BIRTH:  07/18/1933  DATE OF ADMISSION:  12/26/2017  PRIMARY CARE PHYSICIAN: [name], MD   REQUESTING/REFERRING PHYSICIAN: Dr. Clearnce Hasten  CHIEF COMPLAINT:   Chief Complaint  Patient presents with  . Shortness of Breath   Shortness breath 2 days HISTORY OF PRESENT ILLNESS:  Andrew [name]  is a 84 y.o. male with a known history of ESRD on hemodialysis since last month, CAD, hypertension, hyperlipidemia, hypotension on Midodrin, prostate cancer and A. fib etc.  Has had shortness of breath and cough for 2 days.  He has had leg edema but denies any fever or chills.  He is found hypoxia in the ED and put on oxygen by nasal cannula.  He was dialyzed yesterday but still has shortness of breath.  Chest x-ray show bilateral pleural effusion.  Dr. Clearnce Hasten discussed with nephrologist, who suggested the patient has fluid overload and hemodialysis tomorrow.  The patient was just discharged from the hospital for chest pain several days ago.  PAST MEDICAL HISTORY:   Past Medical History:  Diagnosis Date  . Arrhythmia    atrial fibrillation  . Arteriosclerosis of coronary artery 01/27/2014  . Benign essential HTN 12/11/2013  . Carpal tunnel syndrome   . Chronic kidney disease   . Coronary atherosclerosis of native coronary artery   . Disorder of mitral valve 12/11/2013  . Encounter for long-term (current) use of antiplatelets/antithrombotics   . Gout   . Hyperlipidemia   . Hypertension   . Lung nodule    found on xray in Aug 2018  . Mitral valve disorder   . Osteoarthritis   . Prostate cancer ([place])     PAST SURGICAL HISTORY:   Past Surgical History:  Procedure Laterality Date  . COLONOSCOPY    . COLONOSCOPY WITH PROPOFOL N/A 07/23/2016   Procedure: COLONOSCOPY WITH PROPOFOL;  Surgeon: Manya Silvas, MD;  Location: [facility] ENDOSCOPY;  Service: Endoscopy;  Laterality: N/A;   . CORONARY ANGIOPLASTY WITH STENT PLACEMENT    . DIALYSIS/PERMA CATHETER INSERTION N/A 12/09/2017   Procedure: DIALYSIS/PERMA CATHETER INSERTION;  Surgeon: Algernon Huxley, MD;  Location: [facility] CV LAB;  Service: Cardiovascular;  Laterality: N/A;  . DIALYSIS/PERMA CATHETER INSERTION N/A 12/16/2017   Procedure: DIALYSIS/PERMA CATHETER INSERTION;  Surgeon: Algernon Huxley, MD;  Location: [place] CV LAB;  Service: Cardiovascular;  Laterality: N/A;  . ESOPHAGOGASTRODUODENOSCOPY Left 07/28/2017   Procedure: ESOPHAGOGASTRODUODENOSCOPY (EGD);  Surgeon: Virgel Manifold, MD;  Location: [facility] ENDOSCOPY;  Service: Endoscopy;  Laterality: Left;  . ESOPHAGOGASTRODUODENOSCOPY (EGD) WITH PROPOFOL N/A 07/23/2016   Procedure: ESOPHAGOGASTRODUODENOSCOPY (EGD) WITH PROPOFOL;  Surgeon: Manya Silvas, MD;  Location: [facility] ENDOSCOPY;  Service: Endoscopy;  Laterality: N/A;  . PROSTATE BIOPSY      SOCIAL HISTORY:   Social History   Tobacco Use  . Smoking status: Never Smoker  . Smokeless tobacco: Never Used  Substance Use Topics  . Alcohol use: No    FAMILY HISTORY:   Family History  Problem Relation Age of Onset  . Cancer Mother        stomach and uterus  . Cancer Sister        breast    DRUG ALLERGIES:  No Known Allergies  REVIEW OF SYSTEMS:   Review of Systems  Constitutional: Positive for malaise/fatigue. Negative for chills and fever.  HENT: Negative for sore throat.   Eyes: Negative  for blurred vision and double vision.  Respiratory: Positive for cough, sputum production and shortness of breath. Negative for hemoptysis, wheezing and stridor.   Cardiovascular: Positive for leg swelling. Negative for chest pain, palpitations and orthopnea.  Gastrointestinal: Negative for abdominal pain, blood in stool, diarrhea, melena, nausea and vomiting.  Genitourinary: Negative for dysuria, flank pain and hematuria.  Musculoskeletal: Negative for back pain and joint pain.  Neurological:  Negative for dizziness, sensory change, focal weakness, seizures, loss of consciousness, weakness and headaches.  Endo/Heme/Allergies: Negative for polydipsia.  Psychiatric/Behavioral: Negative for depression. The patient is not nervous/anxious.     MEDICATIONS AT HOME:   Prior to Admission medications   Medication Sig Start Date End Date Taking? Authorizing Provider  amiodarone (PACERONE) 400 MG tablet Take 0.5 tablets (200 mg total) by mouth 2 (two) times daily. Take 400mg  oral twice a day for 1 week and then change to once a day after that Patient taking differently: Take 400 mg by mouth daily.  12/21/17  Yes Vaughan Basta, MD  aspirin EC 81 MG tablet Take 1 tablet (81 mg total) by mouth daily. 12/17/17 03/17/18 Yes Gladstone Lighter, MD  atorvastatin (LIPITOR) 40 MG tablet Take 40 mg by mouth daily. 04/24/15  Yes [provider]  benzonatate (TESSALON) 200 MG capsule Take 1 capsule (200 mg total) by mouth 3 (three) times daily. 12/17/17  Yes Gladstone Lighter, MD  finasteride (PROSCAR) 5 MG tablet Take 5 mg by mouth daily. 04/13/15  Yes [provider]  fluticasone (FLONASE) 50 MCG/ACT nasal spray Place 2 sprays into both nostrils daily. 12/18/17  Yes Gladstone Lighter, MD  guaiFENesin (MUCINEX) 600 MG 12 hr tablet Take 1 tablet (600 mg total) by mouth 2 (two) times daily. 12/17/17  Yes Gladstone Lighter, MD  midodrine (PROAMATINE) 5 MG tablet Take 1 tablet (5 mg total) by mouth 3 (three) times daily with meals. 12/17/17  Yes Gladstone Lighter, MD  multivitamin (RENA-VIT) TABS tablet Take 1 tablet by mouth at bedtime. 12/17/17  Yes Gladstone Lighter, MD  Nutritional Supplements (FEEDING SUPPLEMENT, NEPRO CARB STEADY,) LIQD Take 237 mLs by mouth 3 (three) times daily between meals. 12/17/17 01/16/18 Yes Gladstone Lighter, MD  omeprazole (PRILOSEC) 20 MG capsule Take 1 capsule (20 mg total) by mouth 2 (two) times daily. 07/28/17 07/28/18 Yes Mody, Ulice Bold, MD  sodium chloride  (OCEAN) 0.65 % SOLN nasal spray Place 1 spray into both nostrils as needed for congestion. 12/17/17  Yes Gladstone Lighter, MD      VITAL SIGNS:  Blood pressure 90/64, pulse 80, temperature 97.6 F (36.4 C), temperature source Oral, resp. rate (!) 21, height 5\' 9"  (1.753 m), weight 162 lb (73.5 kg), SpO2 100 %.  PHYSICAL EXAMINATION:  Physical Exam  GENERAL:  84 y.o.-year-old patient lying in the bed with no acute distress.  EYES: Pupils equal, round, reactive to light and accommodation. No scleral icterus. Extraocular muscles intact.  HEENT: Head atraumatic, normocephalic. Oropharynx and nasopharynx clear.  NECK:  Supple, no jugular venous distention. No thyroid enlargement, no tenderness.  LUNGS: Normal breath sounds bilaterally, no wheezing, rales,rhonchi or crepitation. No use of accessory muscles of respiration.  CARDIOVASCULAR: S1, S2 normal. No murmurs, rubs, or gallops.  ABDOMEN: Soft, nontender, nondistended. Bowel sounds present. No organomegaly or mass.  EXTREMITIES: No cyanosis, or clubbing.  Bilateral leg edema 2+. NEUROLOGIC: Cranial nerves II through XII are intact. Muscle strength 4/5 in all extremities. Sensation intact. Gait not checked.  PSYCHIATRIC: The patient is alert and oriented x 2.  SKIN: No obvious rash, lesion, or ulcer.   LABORATORY PANEL:   CBC Recent Labs  Lab 12/26/17 1527  WBC 8.8  HGB 8.6*  HCT 27.5*  PLT 281   ------------------------------------------------------------------------------------------------------------------  Chemistries  Recent Labs  Lab 12/26/17 1527  NA 135  K 3.5  CL 98*  CO2 24  GLUCOSE 127*  BUN 25*  CREATININE 3.85*  CALCIUM 8.6*  AST 68*  ALT 52  ALKPHOS 194*  BILITOT 3.0*   ------------------------------------------------------------------------------------------------------------------  Cardiac Enzymes Recent Labs  Lab 12/26/17 1527  TROPONINI 0.16*    ------------------------------------------------------------------------------------------------------------------  RADIOLOGY:  Dg Chest 2 View  Result Date: 12/26/2017 CLINICAL DATA:  Shortness of breath and weakness for several hours EXAM: CHEST - 2 VIEW COMPARISON:  12/20/2017 FINDINGS: Dialysis catheter is again identified. Stable cardiomegaly is seen. Small pleural effusions and bilateral lower lobe infiltrate/atelectasis is noted slightly greater on the right than the left. The overall appearance is similar to that seen on the prior exam. No new bony abnormality is seen. IMPRESSION: Overall stable appearance of the chest with bilateral pleural effusions and lower lobe infiltrate/atelectasis. Electronically Signed   By: Inez Catalina M.D.   On: 12/26/2017 16:21      IMPRESSION AND PLAN:   Fluid overload with hypoxia. Patient will be placed for observation. Continue oxygen by nasal cannula, nebulizer as needed. Since the blood pressure is low, unable to give Lasix. Hemodialysis tomorrow per nephrologist.  ESRD on hemodialysis recently. Hemodialysis tomorrow per nephrologist.  Elevated troponin, due to demanding ischemia and ESRD. Troponin is lowers and days ago.  The patient is on aspirin.  History of chronic A. fib.  Continue amiodarone and aspirin.  The patient was taken off Eliquis due to GI bleeding.  All the records are reviewed and case discussed with ED provider. Management plans discussed with the patient, his son and daughter and they are in agreement.  CODE STATUS: Full code  TOTAL TIME TAKING CARE OF THIS PATIENT: 45 minutes.    Demetrios Loll M.D on 12/26/2017 at 7:38 PM  Between 7am to 6pm - Pager - [phone]  After 6pm go to [email or web address] - Proofreader  [facility]  Office  [phone]  CC: Primary care physician; [name], MD   Note: This dictation was prepared with Dragon dictation along with smaller phrase  technology. Any transcriptional errors that result from this process are unin

## 2017-12-27 DIAGNOSIS — N186 End stage renal disease: Secondary | ICD-10-CM | POA: Diagnosis present

## 2017-12-27 DIAGNOSIS — Z8546 Personal history of malignant neoplasm of prostate: Secondary | ICD-10-CM | POA: Diagnosis not present

## 2017-12-27 DIAGNOSIS — D631 Anemia in chronic kidney disease: Secondary | ICD-10-CM | POA: Diagnosis present

## 2017-12-27 DIAGNOSIS — I5043 Acute on chronic combined systolic (congestive) and diastolic (congestive) heart failure: Secondary | ICD-10-CM | POA: Diagnosis present

## 2017-12-27 DIAGNOSIS — J189 Pneumonia, unspecified organism: Secondary | ICD-10-CM | POA: Diagnosis present

## 2017-12-27 DIAGNOSIS — J9601 Acute respiratory failure with hypoxia: Secondary | ICD-10-CM | POA: Diagnosis present

## 2017-12-27 DIAGNOSIS — M109 Gout, unspecified: Secondary | ICD-10-CM | POA: Diagnosis present

## 2017-12-27 DIAGNOSIS — J40 Bronchitis, not specified as acute or chronic: Secondary | ICD-10-CM | POA: Diagnosis present

## 2017-12-27 DIAGNOSIS — Z992 Dependence on renal dialysis: Secondary | ICD-10-CM | POA: Diagnosis not present

## 2017-12-27 DIAGNOSIS — I059 Rheumatic mitral valve disease, unspecified: Secondary | ICD-10-CM | POA: Diagnosis present

## 2017-12-27 DIAGNOSIS — N179 Acute kidney failure, unspecified: Secondary | ICD-10-CM | POA: Diagnosis present

## 2017-12-27 DIAGNOSIS — I480 Paroxysmal atrial fibrillation: Secondary | ICD-10-CM | POA: Diagnosis present

## 2017-12-27 DIAGNOSIS — I248 Other forms of acute ischemic heart disease: Secondary | ICD-10-CM | POA: Diagnosis present

## 2017-12-27 DIAGNOSIS — R0602 Shortness of breath: Secondary | ICD-10-CM | POA: Diagnosis present

## 2017-12-27 DIAGNOSIS — I132 Hypertensive heart and chronic kidney disease with heart failure and with stage 5 chronic kidney disease, or end stage renal disease: Secondary | ICD-10-CM | POA: Diagnosis present

## 2017-12-27 DIAGNOSIS — R748 Abnormal levels of other serum enzymes: Secondary | ICD-10-CM | POA: Diagnosis present

## 2017-12-27 DIAGNOSIS — G5600 Carpal tunnel syndrome, unspecified upper limb: Secondary | ICD-10-CM | POA: Diagnosis present

## 2017-12-27 DIAGNOSIS — I2510 Atherosclerotic heart disease of native coronary artery without angina pectoris: Secondary | ICD-10-CM | POA: Diagnosis present

## 2017-12-27 DIAGNOSIS — I959 Hypotension, unspecified: Secondary | ICD-10-CM | POA: Diagnosis present

## 2017-12-27 DIAGNOSIS — I509 Heart failure, unspecified: Secondary | ICD-10-CM

## 2017-12-27 DIAGNOSIS — I482 Chronic atrial fibrillation: Secondary | ICD-10-CM | POA: Diagnosis present

## 2017-12-27 DIAGNOSIS — E785 Hyperlipidemia, unspecified: Secondary | ICD-10-CM | POA: Diagnosis present

## 2017-12-27 DIAGNOSIS — N2581 Secondary hyperparathyroidism of renal origin: Secondary | ICD-10-CM | POA: Diagnosis present

## 2017-12-27 DIAGNOSIS — Z7951 Long term (current) use of inhaled steroids: Secondary | ICD-10-CM | POA: Diagnosis not present

## 2017-12-27 DIAGNOSIS — Z7982 Long term (current) use of aspirin: Secondary | ICD-10-CM | POA: Diagnosis not present

## 2017-12-27 DIAGNOSIS — M1990 Unspecified osteoarthritis, unspecified site: Secondary | ICD-10-CM | POA: Diagnosis present

## 2017-12-27 LAB — BASIC METABOLIC PANEL
ANION GAP: 12 (ref 5–15)
BUN: 30 mg/dL — ABNORMAL HIGH (ref 6–20)
CO2: 24 mmol/L (ref 22–32)
Calcium: 8.5 mg/dL — ABNORMAL LOW (ref 8.9–10.3)
Chloride: 101 mmol/L (ref 101–111)
Creatinine, Ser: 4.04 mg/dL — ABNORMAL HIGH (ref 0.61–1.24)
GFR calc non Af Amer: 13 mL/min — ABNORMAL LOW (ref >60)
GFR, EST AFRICAN AMERICAN: 15 mL/min — AB (ref >60)
GLUCOSE: 92 mg/dL (ref 65–99)
Potassium: 3.5 mmol/L (ref 3.5–5.1)
Sodium: 137 mmol/L (ref 135–145)

## 2017-12-27 MED ORDER — OCUVITE-LUTEIN PO CAPS
1.0000 | ORAL_CAPSULE | Freq: Every day | ORAL | Status: DC
  Administered 2017-12-27 – 2017-12-28 (×2): 1 via ORAL
  Filled 2017-12-27 (×3): qty 1

## 2017-12-27 MED ORDER — LEVOFLOXACIN 250 MG PO TABS
250.0000 mg | ORAL_TABLET | ORAL | Status: DC
  Administered 2017-12-27: 250 mg via ORAL
  Filled 2017-12-27 (×3): qty 1

## 2017-12-27 MED ORDER — PREDNISONE 20 MG PO TABS
10.0000 mg | ORAL_TABLET | Freq: Every day | ORAL | Status: DC
  Administered 2017-12-27 – 2017-12-29 (×3): 10 mg via ORAL
  Filled 2017-12-27 (×3): qty 1
  Filled 2017-12-27: qty 4

## 2017-12-27 NOTE — Progress Notes (Signed)
Patient ID: Andrew [name], male   DOB: 11/20/1932, 84 y.o.   MRN: [id number]  [facility] PROGRESS NOTE  Andrew [name] GGY:[id number] DOB: [birth year] DOA: 12/26/2017 PCP: [name], MD  HPI/Subjective: Patient feels like he has a cold.  States he has not felt well for a few weeks.  Complains of some cough.  Some eye discharge.  Pain in his left fourth toe  Objective: Vitals:   12/27/17 1446 12/27/17 1451  BP: (!) 82/49   Pulse: 60 79  Resp: (!) 22   Temp: 97.7 F (36.5 C)   SpO2:  100%    Filed Weights   12/26/17 1534  Weight: 73.5 kg (162 lb)    ROS: Review of Systems  Constitutional: Positive for malaise/fatigue. Negative for chills and fever.  Eyes: Positive for discharge. Negative for blurred vision.  Respiratory: Positive for cough and shortness of breath.   Cardiovascular: Negative for chest pain.  Gastrointestinal: Negative for abdominal pain, constipation, diarrhea, nausea and vomiting.  Genitourinary: Negative for dysuria.  Musculoskeletal: Negative for joint pain.  Neurological: Positive for weakness. Negative for dizziness and headaches.   Exam: Physical Exam  Constitutional: He is oriented to person, place, and time.  HENT:  Nose: No mucosal edema.  Mouth/Throat: No oropharyngeal exudate or posterior oropharyngeal edema.  Eyes: Pupils are equal, round, and reactive to light. Conjunctivae and EOM are normal.  Patient has some crusting around the right eye  Neck: No JVD present. Carotid bruit is not present. No edema present. No thyroid mass and no thyromegaly present.  Cardiovascular: S1 normal and S2 normal. Exam reveals no gallop.  No murmur heard. Pulses:      Dorsalis pedis pulses are 2+ on the right side, and 2+ on the left side.  Respiratory: No respiratory distress. He has decreased breath sounds in the right lower field and the left lower field. He has no wheezes. He has no rhonchi. He has no rales.  GI: Soft. Bowel sounds are normal. There  is no tenderness.  Musculoskeletal:       Right ankle: He exhibits swelling.       Left ankle: He exhibits swelling.  Pain to palpation at the base of the left fourth toe.  Lymphadenopathy:    He has no cervical adenopathy.  Neurological: He is alert and oriented to person, place, and time. No cranial nerve deficit.  Skin: Skin is warm. No rash noted. Nails show no clubbing.  Psychiatric: He has a normal mood and affect.      Data Reviewed: Basic Metabolic Panel: Recent Labs  Lab 12/21/17 1051 12/26/17 1527 12/26/17 2118 12/27/17 0404  NA 135 135  --  137  K 3.7 3.5  --  3.5  CL 100* 98*  --  101  CO2 27 24  --  24  GLUCOSE 128* 127*  --  92  BUN 26* 25*  --  30*  CREATININE 3.89* 3.85* 3.93* 4.04*  CALCIUM 8.1* 8.6*  --  8.5*  MG  --   --  2.0  --   PHOS  --   --  3.4  --    Liver Function Tests: Recent Labs  Lab 12/26/17 1527  AST 68*  ALT 52  ALKPHOS 194*  BILITOT 3.0*  PROT 6.2*  ALBUMIN 3.1*   CBC: Recent Labs  Lab 12/21/17 1051 12/26/17 1527  WBC 10.4 8.8  NEUTROABS  --  6.5  HGB 8.0* 8.6*  HCT 25.2* 27.5*  MCV 70.3*  71.0*  PLT 234 281   Cardiac Enzymes: Recent Labs  Lab 12/20/17 2011 12/26/17 1527  TROPONINI 0.19* 0.16*   BNP (last 3 results) Recent Labs    12/01/17 1004 12/20/17 0000 12/26/17 1527  BNP 3,281.0* 3,648.0* >4,500.0*     CBG: Recent Labs  Lab 12/20/17 1649 12/20/17 1717 12/20/17 1745 12/21/17 0750  GLUCAP 64* 64* 109* 87      Studies: Dg Chest 2 View  Result Date: 12/26/2017 CLINICAL DATA:  Shortness of breath and weakness for several hours EXAM: CHEST - 2 VIEW COMPARISON:  12/20/2017 FINDINGS: Dialysis catheter is again identified. Stable cardiomegaly is seen. Small pleural effusions and bilateral lower lobe infiltrate/atelectasis is noted slightly greater on the right than the left. The overall appearance is similar to that seen on the prior exam. No new bony abnormality is seen. IMPRESSION: Overall stable  appearance of the chest with bilateral pleural effusions and lower lobe infiltrate/atelectasis. Electronically Signed   By: Inez Catalina M.D.   On: 12/26/2017 16:21    Scheduled Meds: . amiodarone  400 mg Oral Daily  . aspirin EC  81 mg Oral Daily  . atorvastatin  40 mg Oral Daily  . benzonatate  200 mg Oral TID  . feeding supplement (NEPRO CARB STEADY)  237 mL Oral TID BM  . finasteride  5 mg Oral Daily  . fluticasone  2 spray Each Nare Daily  . guaiFENesin  600 mg Oral BID  . heparin  5,000 Units Subcutaneous Q8H  . levofloxacin  250 mg Oral Q48H  . midodrine  5 mg Oral TID WC  . multivitamin  1 tablet Oral QHS  . multivitamin-lutein  1 capsule Oral Daily  . pantoprazole  40 mg Oral Daily  . predniSONE  10 mg Oral Q breakfast  . sodium chloride flush  3 mL Intravenous Q12H   Continuous Infusions: . sodium chloride      Assessment/Plan:  1. Acute mid range ejection fraction heart failure.  Dialysis to remove fluid. 2. Relative hypotension.  Limited with medication secondary to hypotension.  Currently on midodrine 5 mg 3 times daily. 3. Gout fourth toe left foot start prednisone 10 mg daily 4. Possibility of pneumonia.  Start Levaquin 250 mg every 48 hours 5. End-stage renal disease on dialysis today 6. Anemia of chronic disease 7. Paroxysmal atrial fibrillation on amiodarone and aspirin 8. Listed as hypoxia in admission note but I do not see any nursing documentation of this.  Check a pulse ox on room air to see if I can get him off the oxygen. 9. Weakness.  Physical therapy evaluation  Code Status:     Code Status Orders  (From admission, onward)        Start     Ordered   12/26/17 2020  Full code  Continuous     12/26/17 2019    Code Status History    Date Active Date Inactive Code Status Order ID Comments User Context   12/20/2017 0645 12/21/2017 1922 Full Code [id number]  Arta Silence, MD Inpatient   12/20/2017 0308 12/20/2017 0645 DNR [id number]  Amelia Jo, MD ED   12/02/2017 1128 12/17/2017 1611 DNR [id number]  Max Sane, MD Inpatient   12/01/2017 1522 12/02/2017 1128 Full Code [id number]  Demetrios Loll, MD Inpatient   10/30/2017 2256 11/02/2017 1709 Full Code [id number]  Lance Coon, MD Inpatient   07/26/2017 1728 07/28/2017 1620 Full Code [id number]  Saundra Shelling, MD Inpatient   04/08/2017 1555 04/10/2017 1526 Full  Code [id number]  Dustin Flock, MD Inpatient    Advance Directive Documentation     Most Recent Value  Type of Advance Directive  Healthcare Power of Attorney, Living will  Pre-existing out of facility DNR order (yellow form or pink MOST form)  -  "MOST" Form in Place?  -    Disposition Plan: Home once breathing better  Consultants:  Nephrology  Antibiotics: -Levaquin  Time spent: 28 minutes  [place]

## 2017-12-27 NOTE — Progress Notes (Signed)
Hd started  

## 2017-12-27 NOTE — Progress Notes (Signed)
Post dialysis assessment 

## 2017-12-27 NOTE — Progress Notes (Signed)
[facility]  ROUNDING NOTE   Subjective:   [name] admitted to [facility] on 12/26/2017 for Shortness of breath [R06.02] Hypervolemia, unspecified hypervolemia type [E87.70] Fluid overload [E87.70]  Seen and examined on hemodialysis. Tolerating treatment well. UF limited by hypotension. UF goal of 1-2 liters    HEMODIALYSIS FLOWSHEET:  Blood Flow Rate (mL/min): 400 mL/min Arterial Pressure (mmHg): -160 mmHg Venous Pressure (mmHg): 130 mmHg Transmembrane Pressure (mmHg): 50 mmHg Ultrafiltration Rate (mL/min): 330 mL/min Dialysate Flow Rate (mL/min): 600 ml/min Conductivity: Machine : 15.4 Conductivity: Machine : 15.4 Dialysis Fluid Bolus: Normal Saline Bolus Amount (mL): 250 mL   Objective:  Vital signs in last 24 hours:  Temp:  [97.4 F (36.3 C)-97.9 F (36.6 C)] 97.7 F (36.5 C) (05/17 1446) Pulse Rate:  [43-82] 79 (05/17 1452) Resp:  [11-28] 22 (05/17 1446) BP: (82-100)/(49-69) 96/58 (05/17 1452) SpO2:  [99 %-100 %] 100 % (05/17 1451)  Weight change:  Filed Weights   12/26/17 1534  Weight: 73.5 kg (162 lb)    Intake/Output: I/O last 3 completed shifts: In: 240 [P.O.:240] Out: 0    Intake/Output this shift:  Total I/O In: 120 [P.O.:120] Out: 1000 [Other:1000]  Physical Exam: General: NAD, laying in bed  Head: Normocephalic, atraumatic. Moist oral mucosal membranes  Eyes: Anicteric,   Neck: Supple, trachea midline  Lungs:  Clear to auscultation  Heart: No rub S1S2  Abdomen:  Soft, nontender, BS present  Extremities: 3+ peripheral edema.   Neurologic: Nonfocal, moving all four extremities  Skin: Warm, dry, no rash  Access:  Right internal jugular permcath    Basic Metabolic Panel: Recent Labs  Lab 12/21/17 1051 12/26/17 1527 12/26/17 2118 12/27/17 0404  NA 135 135  --  137  K 3.7 3.5  --  3.5  CL 100* 98*  --  101  CO2 27 24  --  24  GLUCOSE 128* 127*  --  92  BUN 26* 25*  --  30*  CREATININE 3.89* 3.85* 3.93* 4.04*   CALCIUM 8.1* 8.6*  --  8.5*  MG  --   --  2.0  --   PHOS  --   --  3.4  --     Liver Function Tests: Recent Labs  Lab 12/26/17 1527  AST 68*  ALT 52  ALKPHOS 194*  BILITOT 3.0*  PROT 6.2*  ALBUMIN 3.1*   No results for input(s): LIPASE, AMYLASE in the last 168 hours. No results for input(s): AMMONIA in the last 168 hours.  CBC: Recent Labs  Lab 12/21/17 1051 12/26/17 1527  WBC 10.4 8.8  NEUTROABS  --  6.5  HGB 8.0* 8.6*  HCT 25.2* 27.5*  MCV 70.3* 71.0*  PLT 234 281    Cardiac Enzymes: Recent Labs  Lab 12/20/17 2011 12/26/17 1527  TROPONINI 0.19* 0.16*    BNP: Invalid input(s): POCBNP  CBG: Recent Labs  Lab 12/20/17 1717 12/20/17 1745 12/21/17 0750  GLUCAP 64* 109* 47    Microbiology: Results for orders placed or performed during the hospital encounter of 12/01/17  MRSA PCR Screening     Status: None   Collection Time: 12/05/17  8:00 AM  Result Value Ref Range Status   MRSA by PCR NEGATIVE NEGATIVE Final    Comment:        The GeneXpert MRSA Assay (FDA approved for NASAL specimens only), is one component of a comprehensive MRSA colonization surveillance program. It is not intended to diagnose MRSA infection nor to guide or  monitor treatment for MRSA infections. Performed at [facility], [address] [place], [place] [address]     Coagulation Studies: No results for input(s): LABPROT, INR in the last 72 hours.  Urinalysis: No results for input(s): COLORURINE, LABSPEC, PHURINE, GLUCOSEU, HGBUR, BILIRUBINUR, KETONESUR, PROTEINUR, UROBILINOGEN, NITRITE, LEUKOCYTESUR in the last 72 hours.  Invalid input(s): APPERANCEUR    Imaging: Dg Chest 2 View  Result Date: 12/26/2017 CLINICAL DATA:  Shortness of breath and weakness for several hours EXAM: CHEST - 2 VIEW COMPARISON:  12/20/2017 FINDINGS: Dialysis catheter is again identified. Stable cardiomegaly is seen. Small pleural effusions and bilateral lower lobe  infiltrate/atelectasis is noted slightly greater on the right than the left. The overall appearance is similar to that seen on the prior exam. No new bony abnormality is seen. IMPRESSION: Overall stable appearance of the chest with bilateral pleural effusions and lower lobe infiltrate/atelectasis. Electronically Signed   By: Inez Catalina M.D.   On: 12/26/2017 16:21     Medications:   . sodium chloride     . amiodarone  400 mg Oral Daily  . aspirin EC  81 mg Oral Daily  . atorvastatin  40 mg Oral Daily  . benzonatate  200 mg Oral TID  . feeding supplement (NEPRO CARB STEADY)  237 mL Oral TID BM  . finasteride  5 mg Oral Daily  . fluticasone  2 spray Each Nare Daily  . guaiFENesin  600 mg Oral BID  . heparin  5,000 Units Subcutaneous Q8H  . levofloxacin  250 mg Oral Q48H  . midodrine  5 mg Oral TID WC  . multivitamin  1 tablet Oral QHS  . multivitamin-lutein  1 capsule Oral Daily  . pantoprazole  40 mg Oral Daily  . predniSONE  10 mg Oral Q breakfast  . sodium chloride flush  3 mL Intravenous Q12H   sodium chloride, acetaminophen **OR** acetaminophen, albuterol, bisacodyl, HYDROcodone-acetaminophen, ondansetron **OR** ondansetron (ZOFRAN) IV, senna-docusate, sodium chloride, sodium chloride flush  Assessment/ Plan:  [name] is a 84 y.o. black male with hypertension, hyperlipidemia, gout, ? history of prostate cancer, coronary artery disease, who was admitted to [facility] on 12/01/2017    1. Acute renal failure requiring hemodialysis intermittently. Currently Monday, Wednesday and Friday.  - Continues to have significant peripheral and pulmonary edema. May need daily dialysis to get volume off  2. Anemia of chronic kidney disease: hemoglobin 8.6 - holding epo due to prostate cancer  3. Hypotension with acute exacerbation of diastolic congestive heart failure, atrial fibrillation and possible pneumonia - Midodrine scheduled - levofloxacin ordered.   4. Acute gout flare -  prednisone  5. Secondary Hyperparathyroidism:  - not currently on any binders.   LOS: 1 Keondra Haydu 5/17/20194:57 PM

## 2017-12-27 NOTE — Evaluation (Signed)
Physical Therapy Evaluation Patient Details Name: Andrew [name] MRN: [id number] DOB: 1932-08-25 Today's Date: 12/27/2017   History of Present Illness  84 yo male here 2 weeks ago with renal failure and LE edema with dizziness, SOB, elevated troponin, now here for same with fluid overload (~ 4 weeks post start of dialysis). PMHx:  CKD 4, mitral valve disorder, HTN, atherosclerosis, lung nodule, OA. Had his temp fem cath removed after HD on 12/13/2017.   Clinical Impression  Pt showed good effort with mobility and brief bout of ambulation but quickly fatigued.  He needed to sit quickly after walking just a few feet to transfer to the chair.  Overall he showed reasonably functional strength and his vitals remained relatively stable (unable to get accurate O2 sats, no heavy DOE, remained on 2 liters t/o the session).  Daughter present and reports that she is able to help 24/7 if pt is able to go home, but realizes that per today's performance he is too weak to safely do so.  PT is recommending STR at this time once d/c'd from [facility].    Follow Up Recommendations SNF    Equipment Recommendations       Recommendations for Other Services       Precautions / Restrictions Precautions Precautions: Fall Restrictions Weight Bearing Restrictions: No      Mobility  Bed Mobility Overal bed mobility: Needs Assistance Bed Mobility: Supine to Sit     Supine to sit: Min assist     General bed mobility comments: Pt able to do most of transition to sitting with CGA, but did need light assist to fully get LEs off bed and to lift torso  Transfers Overall transfer level: Needs assistance Equipment used: Rolling walker (2 wheeled) Transfers: Sit to/from Stand Sit to Stand: Min guard         General transfer comment: Pt needed cues to use UEs appropriate, light assist to keep weight forward  Ambulation/Gait Ambulation/Gait assistance: Min guard Ambulation Distance (Feet): 4 Feet Assistive  device: Rolling walker (2 wheeled)       General Gait Details: Pt with cautious and guarded steppage.  He fatigued very quickly and despite plans/set up to try and walk further he was unable to and needed to sit down quickly.  Pt very tired (HR remained stable 80 -90s)  Stairs            Wheelchair Mobility    Modified Rankin (Stroke Patients Only)       Balance Overall balance assessment: Needs assistance Sitting-balance support: No upper extremity supported;Feet supported Sitting balance-Leahy Scale: Good     Standing balance support: Single extremity supported Standing balance-Leahy Scale: Fair Standing balance comment: highly reliant on the walker, poor confidence and standing tolerance                             Pertinent Vitals/Pain Pain Assessment: No/denies pain    Home Living Family/patient expects to be discharged to:: Skilled nursing facility Living Arrangements: Children Available Help at Discharge: Family;Available 24 hours/day Type of Home: House Home Access: Stairs to enter Entrance Stairs-Rails: (pt has chair lift to enter) Entrance Stairs-Number of Steps: [place]: Walker - 4 wheels;Cane - single point      Prior Function Level of Independence: Independent         Comments: Pt states that until ~1 month ago he was out of the home regularly, did not  need SPC. Has recently been more limited with activty     Hand Dominance        Extremity/Trunk Assessment   Upper Extremity Assessment Upper Extremity Assessment: Generalized weakness;Overall WFL for tasks assessed(L weaker than R)    Lower Extremity Assessment Lower Extremity Assessment: Generalized weakness;Overall WFL for tasks assessed(L ankle DF gout pain limited)       Communication      Cognition Arousal/Alertness: Awake/alert Behavior During Therapy: WFL for tasks assessed/performed Overall Cognitive Status: Within Functional Limits for tasks  assessed                                        General Comments      Exercises     Assessment/Plan    PT Assessment Patient needs continued PT services  PT Problem List Decreased strength;Decreased range of motion;Decreased activity tolerance;Decreased balance;Decreased mobility;Decreased coordination;Decreased knowledge of use of DME;Decreased safety awareness;Cardiopulmonary status limiting activity       PT Treatment Interventions DME instruction;Gait training;Stair training;Functional mobility training;Therapeutic activities;Therapeutic exercise;Balance training;Neuromuscular re-education;Patient/family education    PT Goals (Current goals can be found in the Care Plan section)  Acute Rehab PT Goals Patient Stated Goal: get back home PT Goal Formulation: With patient Time For Goal Achievement: 12/17/2017 Potential to Achieve Goals: Good    Frequency Min 2X/week   Barriers to discharge        Co-evaluation               AM-PAC PT "6 Clicks" Daily Activity  Outcome Measure Difficulty turning over in bed (including adjusting bedclothes, sheets and blankets)?: A Little Difficulty moving from lying on back to sitting on the side of the bed? : Unable Difficulty sitting down on and standing up from a chair with arms (e.g., wheelchair, bedside commode, etc,.)?: A Lot Help needed moving to and from a bed to chair (including a wheelchair)?: A Little Help needed walking in hospital room?: A Lot Help needed climbing 3-5 steps with a railing? : A Lot 6 Click Score: 13    End of Session Equipment Utilized During Treatment: Gait belt Activity Tolerance: Patient limited by fatigue Patient left: with chair alarm set;with call bell/phone within reach;with family/visitor present Nurse Communication: Mobility status PT Visit Diagnosis: Unsteadiness on feet (R26.81);Muscle weakness (generalized) (M62.81);Adult, failure to thrive (R62.7);Other abnormalities of gait  and mobility (R26.89)    Time: 5697-9480 PT Time Calculation (min) (ACUTE ONLY): 25 min   Charges:   PT Evaluation $PT Eval Low Complexity: 1 Low     PT G CodesKreg Shropshire, DPT 12/27/2017, 5:43 PM

## 2017-12-27 NOTE — Progress Notes (Signed)
Initial Nutrition Assessment   DOCUMENTATION CODES:   Not applicable  INTERVENTION:   Nepro Shake po TID, each supplement provides 425 kcal and 19 grams protein  Magic cup TID with meals, each supplement provides 290 kcal and 9 grams of protein  Liberalize diet  Rena-vite daily   Ocuvite daily for wound healing (provides zinc, vitamin A, vitamin C, Vitamin E, copper, and selenium)  NUTRITION DIAGNOSIS:   Increased nutrient needs related to chronic illness(ESRD on HD, CHF, wounds) as evidenced by increased estimated needs from protein.   GOAL:   Patient will meet greater than or equal to 90% of their needs   MONITOR:   PO intake, Supplement acceptance, Labs, Weight trends, I & O's, Skin  ASSESSMENT:   84 y.o. male with a known history of ESRD on hemodialysis since last month, CAD, hypertension, hyperlipidemia, hypotension on Midodrin, prostate cancer and A. fib etc.  Presented with shortness of breath, cough for 2 days and leg edema. Chest x-ray show bilateral pleural effusion.   Unable to see patient today as pt in HD at time of RD visit. RD familiar with this pt from multiple previous admits most recently was 1 week ago. Pt with poor appetite and oral intake at baseline recommended for PEG tube during last admit pending pt's goals of care. Pt had improved appetite and oral intake prior to discharge and was eating 75% of meals. Per chart, pt documented to have eaten 90% of his breakfast this morning. Per chart, pt appears weight stable however admit wt appears reported and not measured. Pt ordered for Rena-vite and Nepro; RD will add ocuvite and Magic Cups. Liberalize diet. RD will obtain nutrition related history and exam at follow up.   Medications reviewed and include: aspirin, heparin, protonix, rena-vite, ocuvite  Labs reviewed: K 3.5 wnl, BUN 30(H), creat 4.04(H), Ca 8.5(L) P 3.4 wnl, Mg 2.0 wnl- 5/16 BNP >4500(H)- 5/16 Wbc- Hgb 8.6(L), Hct 27.5(L)- 5/16  Diet Order:    Diet Order           Diet renal with fluid restriction Fluid restriction: 1200 mL Fluid; Room service appropriate? Yes; Fluid consistency: Thin  Diet effective now         EDUCATION NEEDS:   No education needs have been identified at this time  Skin:  Skin Assessment: (wound coccyx )  Last BM:  5/14  Height:   Ht Readings from Last 1 Encounters:  12/26/17 5\' 9"  (1.753 m)    Weight:   Wt Readings from Last 1 Encounters:  12/26/17 162 lb (73.5 kg)    Ideal Body Weight:  72.7 kg  BMI:  Body mass index is 23.92 kg/m.  Estimated Nutritional Needs:   Kcal:  1700-2000kcal/day   Protein:  90-104g/day   Fluid:  per MD  Koleen Distance MS, RD, LDN Pager #[phone] After Hours Pager: [phone]

## 2017-12-27 NOTE — Progress Notes (Signed)
This note also relates to the following rows which could not be included: Resp - Cannot attach notes to unvalidated device data BP - Cannot attach notes to unvalidated device data  Hd copleted  

## 2017-12-28 MED ORDER — MIDODRINE HCL 5 MG PO TABS
10.0000 mg | ORAL_TABLET | Freq: Three times a day (TID) | ORAL | Status: DC
  Administered 2017-12-28 – 2017-12-29 (×3): 10 mg via ORAL
  Filled 2017-12-28 (×4): qty 2

## 2017-12-28 MED ORDER — ALBUMIN HUMAN 25 % IV SOLN
25.0000 g | Freq: Once | INTRAVENOUS | Status: AC
Start: 2017-12-28 — End: 2017-12-28
  Administered 2017-12-28: 25 g via INTRAVENOUS
  Filled 2017-12-28: qty 100

## 2017-12-28 NOTE — Clinical Social Work Note (Signed)
The CSW met with the patient, his daughter, and his spouse at bedside to discuss discharge planning. The patient and his family decline SNF and are willing to return home with home health having made modifications to the home such as widening of doors and installing a stair lift. The CSW is signing off. Please consult should needs change or the decision change.  Santiago Bumpers, MSW, Latanya Presser [phone]

## 2017-12-28 NOTE — Progress Notes (Signed)
Did not ambulate, spent quite a while in HD.  Pivoted to [facility] only this evening.

## 2017-12-28 NOTE — Evaluation (Signed)
Occupational Therapy Evaluation Patient Details Name: Andrew [name] MRN: [id number] DOB: 1933-04-02 Today's Date: 12/28/2017    History of Present Illness 84 yo male here 2 weeks ago with renal failure and LE edema with dizziness, SOB, elevated troponin, now here for same with fluid overload (~ 4 weeks post start of dialysis). PMHx:  CKD 4, mitral valve disorder, HTN, atherosclerosis, lung nodule, OA. Had his temp fem cath removed after HD on 12/13/2017.    Clinical Impression   Patient had recent emesis episode, but states he is feeling better and can participate in OT evaluation. Daughter and family friend present. Patient was sitting up in recliner. Able to participate in all tasks, but fatigues quickly and needs frequent rest breaks. Patient struggled with lower body dressing due to fatigue, and could only stand for up to 1 minute before needing rest break. Education on AE and compensatory techniques provided to patient and family. Use of reacher and sock aid for lower body dressing demonstrated. Patient and family verbalize understanding. Patient would benefit from continued OT services and Home Health to focus on use of AE and energy conservation techniques to manage decreased activity tolerance and generalized weakness.     Follow Up Recommendations  Home health OT    Equipment Recommendations       Recommendations for Other Services       Precautions / Restrictions Precautions Precautions: Fall Restrictions Weight Bearing Restrictions: No      Mobility Bed Mobility                  Transfers Overall transfer level: Needs assistance Equipment used: Rolling walker (2 wheeled) Transfers: Sit to/from Stand Sit to Stand: Min guard              Balance Overall balance assessment: Needs assistance                                         ADL either performed or assessed with clinical judgement   ADL Overall ADL's : Needs  assistance/impaired Eating/Feeding: Independent;Set up               Upper Body Dressing : Set up   Lower Body Dressing: Minimal assistance   Toilet Transfer: Min guard;Supervision/safety   Toileting- Clothing Manipulation and Hygiene: Min guard;Supervision/safety       Functional mobility during ADLs: Min guard;Supervision/safety;Rolling walker General ADL Comments: Patient is fatiguing quickly with all tasks, so will need supervision for fall risk due to quick onset of fatigue and overall generalized weakness with all ADL tasks.     Vision Patient Visual Report: No change from baseline       Perception     Praxis      Pertinent Vitals/Pain Pain Assessment: No/denies pain     Hand Dominance Right   Extremity/Trunk Assessment Upper Extremity Assessment Upper Extremity Assessment: Overall WFL for tasks assessed;Generalized weakness   Lower Extremity Assessment Lower Extremity Assessment: Overall WFL for tasks assessed;Generalized weakness       Communication Communication Communication: HOH   Cognition Arousal/Alertness: Awake/alert Behavior During Therapy: WFL for tasks assessed/performed Overall Cognitive Status: Within Functional Limits for tasks assessed                                 General Comments: Patient able to answer all questions  and demonstrated good problem solving.   General Comments       Exercises     Shoulder Instructions      Home Living Family/patient expects to be discharged to:: Private residence Living Arrangements: Children Available Help at Discharge: Family;Available 24 hours/day Type of Home: House Home Access: Stairs to enter [employer] of Steps: 14(has lift system in home so can access all floors)   Home Layout: Multi-level Alternate Level Stairs-Number of Steps: 14       Bathroom Toilet: Standard Bathroom Accessibility: Yes   Home Equipment: Walker - 4 wheels;Cane - single point           Prior Functioning/Environment Level of Independence: Independent        Comments: Pt states that until ~1 month ago he was out of the home regularly, did not need SPC. Has recently been more limited with activty        OT Problem List: Decreased strength;Decreased activity tolerance;Impaired balance (sitting and/or standing);Decreased knowledge of use of DME or AE      OT Treatment/Interventions: Self-care/ADL training;Therapeutic exercise;Neuromuscular education;Patient/family education;Therapeutic activities;DME and/or AE instruction    OT Goals(Current goals can be found in the care plan section) Acute Rehab OT Goals Patient Stated Goal: get back home OT Goal Formulation: With patient/family Time For Goal Achievement: 01/11/18 Potential to Achieve Goals: Good  OT Frequency: Min 2X/week   Barriers to D/C:            Co-evaluation              AM-PAC PT "6 Clicks" Daily Activity     Outcome Measure Help from another person eating meals?: None Help from another person taking care of personal grooming?: A Little Help from another person toileting, which includes using toliet, bedpan, or urinal?: A Little Help from another person bathing (including washing, rinsing, drying)?: A Little Help from another person to put on and taking off regular upper body clothing?: A Little Help from another person to put on and taking off regular lower body clothing?: A Little 6 Click Score: 19   End of Session Equipment Utilized During Treatment: Gait belt;Rolling walker  Activity Tolerance: Patient tolerated treatment well;Patient limited by fatigue Patient left: in chair;with call bell/phone within reach;with family/visitor present  OT Visit Diagnosis: Unsteadiness on feet (R26.81);Muscle weakness (generalized) (M62.81)                Time: 5885-0277 OT Time Calculation (min): 35 min Charges:  OT General Charges $OT Visit: 1 Visit OT Evaluation $OT Eval Low  Complexity: 1 Low OT Treatments $Self Care/Home Management : 23-37 mins G-Codes:     Amie Portland, OTR/L Chaise Mahabir L 12/28/2017, 3:29 PM

## 2017-12-28 NOTE — Progress Notes (Signed)
This note also relates to the following rows which could not be included: Resp - Cannot attach notes to unvalidated device data BP - Cannot attach notes to unvalidated device data  Hd started  

## 2017-12-28 NOTE — Progress Notes (Signed)
This note also relates to the following rows which could not be included: Resp - Cannot attach notes to unvalidated device data  Hd completed

## 2017-12-28 NOTE — Progress Notes (Signed)
Patient ID: Andrew [name], male   DOB: 10-10-1932, 84 y.o.   MRN: [id number]  [employer] PROGRESS NOTE  Andrew [name] NWG:[id number] DOB: 20-May-1933 DOA: 12/26/2017 PCP: [name], MD  HPI/Subjective: Patient reports exertional dyspnea, 2 pain is improving.  Daughter [name] at bedsid.  Patient had hemodialysis yesterday , scheduled for another extra hemodialysis today Objective: Vitals:   12/27/17 2055 12/28/17 0443  BP: (!) 92/57 (!) 93/57  Pulse: 83 63  Resp:  20  Temp: 98.4 F (36.9 C) 98.3 F (36.8 C)  SpO2: 100% 94%    Filed Weights   12/26/17 1534  Weight: 73.5 kg (162 lb)    ROS: Review of Systems  Constitutional: Positive for malaise/fatigue. Negative for chills and fever.  Eyes: Positive for discharge. Negative for blurred vision.  Respiratory: Positive for cough and shortness of breath.   Cardiovascular: Negative for chest pain.  Gastrointestinal: Negative for abdominal pain, constipation, diarrhea, nausea and vomiting.  Genitourinary: Negative for dysuria.  Musculoskeletal: Negative for joint pain.  Neurological: Positive for weakness. Negative for dizziness and headaches.   Exam: Physical Exam  Constitutional: He is oriented to person, place, and time.  HENT:  Nose: No mucosal edema.  Mouth/Throat: No oropharyngeal exudate or posterior oropharyngeal edema.  Eyes: Pupils are equal, round, and reactive to light. Conjunctivae and EOM are normal.  Patient has some crusting around the right eye  Neck: No JVD present. Carotid bruit is not present. No edema present. No thyroid mass and no thyromegaly present.  Cardiovascular: S1 normal and S2 normal. Exam reveals no gallop.  No murmur heard. Pulses:      Dorsalis pedis pulses are 2+ on the right side, and 2+ on the left side.  Respiratory: No respiratory distress. He has decreased breath sounds in the right lower field and the left lower field. He has no wheezes. He has no rhonchi. He has no rales.  GI:  Soft. Bowel sounds are normal. There is no tenderness.  Musculoskeletal:       Right ankle: He exhibits swelling.       Left ankle: He exhibits swelling.  Pain to palpation at the base of the left fourth toe.  Lymphadenopathy:    He has no cervical adenopathy.  Neurological: He is alert and oriented to person, place, and time. No cranial nerve deficit.  Skin: Skin is warm. No rash noted. Nails show no clubbing.  Psychiatric: He has a normal mood and affect.      Data Reviewed: Basic Metabolic Panel: Recent Labs  Lab 12/26/17 1527 12/26/17 2118 12/27/17 0404  NA 135  --  137  K 3.5  --  3.5  CL 98*  --  101  CO2 24  --  24  GLUCOSE 127*  --  92  BUN 25*  --  30*  CREATININE 3.85* 3.93* 4.04*  CALCIUM 8.6*  --  8.5*  MG  --  2.0  --   PHOS  --  3.4  --    Liver Function Tests: Recent Labs  Lab 12/26/17 1527  AST 68*  ALT 52  ALKPHOS 194*  BILITOT 3.0*  PROT 6.2*  ALBUMIN 3.1*   CBC: Recent Labs  Lab 12/26/17 1527  WBC 8.8  NEUTROABS 6.5  HGB 8.6*  HCT 27.5*  MCV 71.0*  PLT 281   Cardiac Enzymes: Recent Labs  Lab 12/26/17 1527  TROPONINI 0.16*   BNP (last 3 results) Recent Labs    12/01/17 1004 12/20/17 0000  12/26/17 1527  BNP 3,281.0* 3,648.0* >4,500.0*     CBG: No results for input(s): GLUCAP in the last 168 hours.    Studies: Dg Chest 2 View  Result Date: 12/26/2017 CLINICAL DATA:  Shortness of breath and weakness for several hours EXAM: CHEST - 2 VIEW COMPARISON:  12/20/2017 FINDINGS: Dialysis catheter is again identified. Stable cardiomegaly is seen. Small pleural effusions and bilateral lower lobe infiltrate/atelectasis is noted slightly greater on the right than the left. The overall appearance is similar to that seen on the prior exam. No new bony abnormality is seen. IMPRESSION: Overall stable appearance of the chest with bilateral pleural effusions and lower lobe infiltrate/atelectasis. Electronically Signed   By: Inez Catalina M.D.    On: 12/26/2017 16:21    Scheduled Meds: . amiodarone  400 mg Oral Daily  . aspirin EC  81 mg Oral Daily  . atorvastatin  40 mg Oral Daily  . benzonatate  200 mg Oral TID  . feeding supplement (NEPRO CARB STEADY)  237 mL Oral TID BM  . finasteride  5 mg Oral Daily  . fluticasone  2 spray Each Nare Daily  . guaiFENesin  600 mg Oral BID  . heparin  5,000 Units Subcutaneous Q8H  . levofloxacin  250 mg Oral Q48H  . midodrine  10 mg Oral TID WC  . multivitamin  1 tablet Oral QHS  . multivitamin-lutein  1 capsule Oral Daily  . pantoprazole  40 mg Oral Daily  . predniSONE  10 mg Oral Q breakfast  . sodium chloride flush  3 mL Intravenous Q12H   Continuous Infusions: . sodium chloride    . albumin human      Assessment/Plan:  1. Acute mid range ejection fraction heart failure.  Dialysis to remove fluid.  Nephrology is following.  Had hemodialysis yesterday and another hemodialysis today.  Patient will be receiving IV albumin during hemodialysis 2. Relative hypotension.  Limited with medication secondary to hypotension.  Currently on midodrine 5 mg 3 times daily. 3. Gout fourth toe left foot.  Clinically improving with prednisone 10 mg daily 4. Possibility of pneumonia.   clinically improving.  Continue Levaquin 250 mg every 48 hours 5. End-stage renal disease on dialysis today, discussed with nephrology 6. Anemia of chronic disease 7. Paroxysmal atrial fibrillation on amiodarone and aspirin 8. Listed as hypoxia in admission note but I do not see any nursing documentation of this.  Check a pulse ox on room air to see if I can get him off the oxygen. 9. Weakness.  Physical therapy evaluation-recommending skilled nursing facility 10. Intermittent episodes of hypoxia-wean off oxygen and she can ability pulse ox and also nocturnal pulse ox to see if patient meets criteria for home O2  Code Status:     Code Status Orders  (From admission, onward)        Start     Ordered   12/26/17  2020  Full code  Continuous     12/26/17 2019    Code Status History    Date Active Date Inactive Code Status Order ID Comments User Context   12/20/2017 0645 12/21/2017 1922 Full Code [id number]  Arta Silence, MD Inpatient   12/20/2017 0308 12/20/2017 0645 DNR [id number]  Amelia Jo, MD ED   12/02/2017 1128 12/17/2017 1611 DNR [id number]  Max Sane, MD Inpatient   12/01/2017 1522 12/02/2017 1128 Full Code [id number]  Demetrios Loll, MD Inpatient   10/30/2017 2256 11/02/2017 1709 Full Code [id number]  Lance Coon, MD Inpatient  07/26/2017 1728 07/28/2017 1620 Full Code [id number]  Saundra Shelling, MD Inpatient   04/08/2017 1555 04/10/2017 1526 Full Code [id number]  Dustin Flock, MD Inpatient    Advance Directive Documentation     Most Recent Value  Type of Advance Directive  Healthcare Power of Attorney, Living will  Pre-existing out of facility DNR order (yellow form or pink MOST form)  -  "MOST" Form in Place?  -    Disposition Plan: Most likely home with home health as per my discussion with daughter.  They have all equipment at home.  She will discuss with the dad and let us know their final decision  Consultants:  Nephrology  Antibiotics: -Levaquin  Time spent: 32 minutes  Illene Silver Anik Wesch  [employer]

## 2017-12-28 NOTE — Progress Notes (Signed)
[facility]  ROUNDING NOTE   Subjective:   Girlfriend at bedside.   Silt 3 liters O2  Hemodialysis treatment yesterday. Tolerated treatment well. UF of 1 liter. UF limited due to hypotension.   Objective:  Vital signs in last 24 hours:  Temp:  [97.6 F (36.4 C)-98.4 F (36.9 C)] 98.3 F (36.8 C) (05/18 0443) Pulse Rate:  [43-83] 63 (05/18 0443) Resp:  [14-24] 20 (05/18 0443) BP: (82-99)/(49-65) 93/57 (05/18 0443) SpO2:  [94 %-100 %] 94 % (05/18 0443)  Weight change:  Filed Weights   12/26/17 1534  Weight: 73.5 kg (162 lb)    Intake/Output: I/O last 3 completed shifts: In: 360 [P.O.:360] Out: 1000 [Other:1000]   Intake/Output this shift:  No intake/output data recorded.  Physical Exam: General: NAD, laying in bed  Head: Normocephalic, atraumatic. Moist oral mucosal membranes  Eyes: Anicteric,   Neck: Supple, trachea midline  Lungs:  Crackles at bases  Heart: No rub S1S2  Abdomen:  Soft, nontender, BS present  Extremities: 3+ peripheral edema.   Neurologic: Nonfocal, moving all four extremities  Skin: Warm, dry, no rash  Access:  Right internal jugular permcath    Basic Metabolic Panel: Recent Labs  Lab 12/26/17 1527 12/26/17 2118 12/27/17 0404  NA 135  --  137  K 3.5  --  3.5  CL 98*  --  101  CO2 24  --  24  GLUCOSE 127*  --  92  BUN 25*  --  30*  CREATININE 3.85* 3.93* 4.04*  CALCIUM 8.6*  --  8.5*  MG  --  2.0  --   PHOS  --  3.4  --     Liver Function Tests: Recent Labs  Lab 12/26/17 1527  AST 68*  ALT 52  ALKPHOS 194*  BILITOT 3.0*  PROT 6.2*  ALBUMIN 3.1*   No results for input(s): LIPASE, AMYLASE in the last 168 hours. No results for input(s): AMMONIA in the last 168 hours.  CBC: Recent Labs  Lab 12/26/17 1527  WBC 8.8  NEUTROABS 6.5  HGB 8.6*  HCT 27.5*  MCV 71.0*  PLT 281    Cardiac Enzymes: Recent Labs  Lab 12/26/17 1527  TROPONINI 0.16*    BNP: Invalid input(s): POCBNP  CBG: No results for  input(s): GLUCAP in the last 168 hours.  Microbiology: Results for orders placed or performed during the hospital encounter of 12/01/17  MRSA PCR Screening     Status: None   Collection Time: 12/05/17  8:00 AM  Result Value Ref Range Status   MRSA by PCR NEGATIVE NEGATIVE Final    Comment:        The GeneXpert MRSA Assay (FDA approved for NASAL specimens only), is one component of a comprehensive MRSA colonization surveillance program. It is not intended to diagnose MRSA infection nor to guide or monitor treatment for MRSA infections. Performed at [facility], [address] [place], [place] [address]     Coagulation Studies: No results for input(s): LABPROT, INR in the last 72 hours.  Urinalysis: No results for input(s): COLORURINE, LABSPEC, PHURINE, GLUCOSEU, HGBUR, BILIRUBINUR, KETONESUR, PROTEINUR, UROBILINOGEN, NITRITE, LEUKOCYTESUR in the last 72 hours.  Invalid input(s): APPERANCEUR    Imaging: Dg Chest 2 View  Result Date: 12/26/2017 CLINICAL DATA:  Shortness of breath and weakness for several hours EXAM: CHEST - 2 VIEW COMPARISON:  12/20/2017 FINDINGS: Dialysis catheter is again identified. Stable cardiomegaly is seen. Small pleural effusions and bilateral lower lobe infiltrate/atelectasis is noted  slightly greater on the right than the left. The overall appearance is similar to that seen on the prior exam. No new bony abnormality is seen. IMPRESSION: Overall stable appearance of the chest with bilateral pleural effusions and lower lobe infiltrate/atelectasis. Electronically Signed   By: Inez Catalina M.D.   On: 12/26/2017 16:21     Medications:   . sodium chloride    . albumin human     . amiodarone  400 mg Oral Daily  . aspirin EC  81 mg Oral Daily  . atorvastatin  40 mg Oral Daily  . benzonatate  200 mg Oral TID  . feeding supplement (NEPRO CARB STEADY)  237 mL Oral TID BM  . finasteride  5 mg Oral Daily  . fluticasone  2 spray Each Nare Daily   . guaiFENesin  600 mg Oral BID  . heparin  5,000 Units Subcutaneous Q8H  . levofloxacin  250 mg Oral Q48H  . midodrine  5 mg Oral TID WC  . multivitamin  1 tablet Oral QHS  . multivitamin-lutein  1 capsule Oral Daily  . pantoprazole  40 mg Oral Daily  . predniSONE  10 mg Oral Q breakfast  . sodium chloride flush  3 mL Intravenous Q12H   sodium chloride, acetaminophen **OR** acetaminophen, albuterol, bisacodyl, HYDROcodone-acetaminophen, ondansetron **OR** ondansetron (ZOFRAN) IV, senna-docusate, sodium chloride, sodium chloride flush  Assessment/ Plan:  Mr. RAKEEN GAILLARD is a 84 y.o. black male with hypertension, hyperlipidemia, gout, ? history of prostate cancer, coronary artery disease, who was admitted to [facility] on 12/01/2017    1. Acute renal failure requiring hemodialysis intermittently. Currently Monday, Wednesday and Friday.  - Continues to have significant peripheral and pulmonary edema. - Extra dialysis treatment today. Will provide IV albumin during HD treatment to assist with ultrafiltration.   2. Anemia of chronic kidney disease: hemoglobin 8.6 - holding epo due to prostate cancer  3. Hypotension with acute exacerbation of diastolic congestive heart failure, atrial fibrillation and possible pneumonia - Midodrine- increase to 10mg  tid - levofloxacin .   4. Acute gout flare - prednisone  5. Secondary Hyperparathyroidism:  - not currently on any binders.   LOS: 2 Yancy Knoble 5/18/201912:12 PM

## 2017-12-28 NOTE — Progress Notes (Signed)
Set patient up on overnight. Order does not indicated if testing is to be performed on room air or o2. Patient is currently on 3 liters in no distress. Daughter stated there was some talk to see if patient could possibly get oxygen at home. RN not sure. Put patient on room air. sats in high 90s. Will perform on room air. o2 on stand by. Will continue to monitor

## 2017-12-29 MED ORDER — GUAIFENESIN ER 600 MG PO TB12: 600.0000 mg | ORAL_TABLET | Freq: Two times a day (BID) | ORAL | 0 refills | Status: AC | PRN

## 2017-12-29 MED ORDER — MIDODRINE HCL 10 MG PO TABS: 10.0000 mg | ORAL_TABLET | Freq: Three times a day (TID) | ORAL | 0 refills | Status: AC

## 2017-12-29 MED ORDER — SENNOSIDES-DOCUSATE SODIUM 8.6-50 MG PO TABS: 1.0000 | ORAL_TABLET | Freq: Every evening | ORAL | Status: AC | PRN

## 2017-12-29 MED ORDER — BENZONATATE 200 MG PO CAPS: 200.0000 mg | ORAL_CAPSULE | Freq: Three times a day (TID) | ORAL | 0 refills | Status: AC | PRN

## 2017-12-29 MED ORDER — PREDNISONE 10 MG PO TABS: 10.0000 mg | ORAL_TABLET | Freq: Two times a day (BID) | ORAL | 0 refills | Status: AC

## 2017-12-29 MED ORDER — HYDROCODONE-ACETAMINOPHEN 5-325 MG PO TABS: 1.0000 | ORAL_TABLET | ORAL | 0 refills | Status: AC | PRN

## 2017-12-29 MED ORDER — OCUVITE-LUTEIN PO CAPS: 1.0000 | ORAL_CAPSULE | Freq: Every day | ORAL | 0 refills | Status: AC

## 2017-12-29 MED ORDER — AMIODARONE HCL 400 MG PO TABS: 400.0000 mg | ORAL_TABLET | Freq: Every day | ORAL | 0 refills | Status: AC

## 2017-12-29 MED ORDER — LEVOFLOXACIN 250 MG PO TABS: 250.0000 mg | ORAL_TABLET | ORAL | 0 refills | Status: DC

## 2017-12-29 MED ORDER — ACETAMINOPHEN 325 MG PO TABS: 650.0000 mg | ORAL_TABLET | Freq: Four times a day (QID) | ORAL | Status: AC | PRN

## 2017-12-29 NOTE — Progress Notes (Signed)
Home now w/daughter Andrew [name], everything in place for him (O2, HH).

## 2017-12-29 NOTE — Progress Notes (Signed)
Janett Billow, Tech got pt up OOB and walking in hall,his Sp02 was 92 when started walk- got about 30 feet and had to sit down, his Sp02 now is 77% on RA. Will notify Dr Margaretmary Eddy.

## 2017-12-29 NOTE — Discharge Summary (Signed)
[facility] [place] NAME: Andrew [name]    MR#:  [id number]  DATE OF BIRTH:  01/22/1933  DATE OF ADMISSION:  12/26/2017 ADMITTING PHYSICIAN: Demetrios Loll, MD  DATE OF DISCHARGE:  12/29/17 PRIMARY CARE PHYSICIAN: [name], MD    ADMISSION DIAGNOSIS:  Shortness of breath [R06.02] Hypervolemia, unspecified hypervolemia type [E87.70] Fluid overload [E87.70]  DISCHARGE DIAGNOSIS:  Active Problems:   Fluid overload   CHF (congestive heart failure) (HCC) Pneumonia with bronchoconstriction with a component of bronchitis  SECONDARY DIAGNOSIS:   Past Medical History:  Diagnosis Date  . Arrhythmia    atrial fibrillation  . Arteriosclerosis of coronary artery 01/27/2014  . Benign essential HTN 12/11/2013  . Carpal tunnel syndrome   . Chronic kidney disease   . Coronary atherosclerosis of native coronary artery   . Disorder of mitral valve 12/11/2013  . Encounter for long-term (current) use of antiplatelets/antithrombotics   . Gout   . Hyperlipidemia   . Hypertension   . Lung nodule    found on xray in Aug 2018  . Mitral valve disorder   . Osteoarthritis   . Prostate cancer [facility])     HOSPITAL COURSE:  hpi  Andrew [name]  is a 84 y.o. male with a known history of ESRD on hemodialysis since last month, CAD, hypertension, hyperlipidemia, hypotension on Midodrin, prostate cancer and A. fib etc.  Has had shortness of breath and cough for 2 days.  He has had leg edema but denies any fever or chills.  He is found hypoxia in the ED and put on oxygen by nasal cannula.  He was dialyzed yesterday but still has shortness of breath.  Chest x-ray show bilateral pleural effusion.  Dr. Clearnce Hasten discussed with nephrologist, who suggested the patient has fluid overload and hemodialysis tomorrow.  The patient was just discharged from the hospital for chest pain several days ago.  1. Acute combined CHF exacerbation ejection fraction 45 to 50% Dialysis  performed daily to remove fluid.    Patient clinically improved and okay to discharge patient from nephrology standpoint.  Patient has to continue regular dialysis on Monday, Wednesday and Friday discussed with both patient and daughter and reinforced the importance of being compliant with the dialysis. Had hemodialysis on 12/27/2017 and 12/28/2017 .  Has received IV albumin during hemodialysis yesterday  2. Relative hypotension.  Limited with medication secondary to hypotension.  Currently on midodrine 10 mg 3 times daily dose increased from 5 mg, 3. Gout fourth toe left foot.  Clinically improving with prednisone 10 mg daily 4. Acute hypoxic respiratory failure from underlying clinical pneumonia with bronchoconstriction and component of bronchitis.   clinically improving.  Continue Levaquin 250 mg every 48 hours, 4 more doses and patient has qualified for 2 L of oxygen as he was desaturating to 77 to 78% with ambulation.  Patient is to continue Mucinex as needed basis 5. End-stage renal disease -had hemodialysis on 5/17 and 12/29/2017 discussed with nephrology.  Continue outpatient hemodialysis on Monday Wednesday and Friday as scheduled 6. Anemia of chronic disease people is on hold in view of prostate cancer outpatient follow-up with primary care physician and urologist for surveillance 7. Paroxysmal atrial fibrillation on amiodarone and aspirin  8. Weakness.  Physical therapy evaluation-recommending skilled nursing facility had a lengthy discussion with the patient and his daughter at bedside,they prefer going home with home health, discussed with case manager       DISCHARGE CONDITIONS:  fair  CONSULTS OBTAINED:  Treatment Team:  Lavonia Dana, MD   PROCEDURES HD   DRUG ALLERGIES:  No Known Allergies  DISCHARGE MEDICATIONS:   Allergies as of 12/29/2017   No Known Allergies     Medication List    TAKE these medications   acetaminophen 325 MG tablet Commonly known as:   TYLENOL Take 2 tablets (650 mg total) by mouth every 6 (six) hours as needed for mild pain (or Fever >/= 101).   amiodarone 400 MG tablet Commonly known as:  PACERONE Take 1 tablet (400 mg total) by mouth daily. Start taking on:  12/30/2017   aspirin EC 81 MG tablet Take 1 tablet (81 mg total) by mouth daily.   atorvastatin 40 MG tablet Commonly known as:  LIPITOR Take 40 mg by mouth daily.   benzonatate 200 MG capsule Commonly known as:  TESSALON Take 1 capsule (200 mg total) by mouth 3 (three) times daily as needed for cough. What changed:    when to take this  reasons to take this   feeding supplement (NEPRO CARB STEADY) Liqd Take 237 mLs by mouth 3 (three) times daily between meals.   finasteride 5 MG tablet Commonly known as:  PROSCAR Take 5 mg by mouth daily.   fluticasone 50 MCG/ACT nasal spray Commonly known as:  FLONASE Place 2 sprays into both nostrils daily.   guaiFENesin 600 MG 12 hr tablet Commonly known as:  MUCINEX Take 1 tablet (600 mg total) by mouth 2 (two) times daily as needed. What changed:    when to take this  reasons to take this   HYDROcodone-acetaminophen 5-325 MG tablet Commonly known as:  NORCO/VICODIN Take 1-2 tablets by mouth every 4 (four) hours as needed for moderate pain.   levofloxacin 250 MG tablet Commonly known as:  LEVAQUIN Take 1 tablet (250 mg total) by mouth every other day.   midodrine 10 MG tablet Commonly known as:  PROAMATINE Take 1 tablet (10 mg total) by mouth 3 (three) times daily with meals. What changed:    medication strength  how much to take   multivitamin Tabs tablet Take 1 tablet by mouth at bedtime.   multivitamin-lutein Caps capsule Take 1 capsule by mouth daily.   omeprazole 20 MG capsule Commonly known as:  PRILOSEC Take 1 capsule (20 mg total) by mouth 2 (two) times daily.   predniSONE 10 MG tablet Commonly known as:  DELTASONE Take 1 tablet (10 mg total) by mouth 2 (two) times daily  with a meal for 5 days.   senna-docusate 8.6-50 MG tablet Commonly known as:  Senokot-S Take 1 tablet by mouth at bedtime as needed for mild constipation.   sodium chloride 0.65 % Soln nasal spray Commonly known as:  OCEAN Place 1 spray into both nostrils as needed for congestion.            Durable Medical Equipment  (From admission, onward)        Start     Ordered   12/29/17 1107  For home use only DME oxygen  Once    Comments:  ESRD with fluid over load , pna with acute bronchitis  Question Answer Comment  Mode or (Route) Nasal cannula   Liters per Minute 2   Frequency Continuous (stationary and portable oxygen unit needed)   Oxygen conserving device Yes   Oxygen delivery system Gas      12/29/17 1107       DISCHARGE INSTRUCTIONS:   Continue 2  L of oxygen via nasal cannula Continue daily weight monitoring, intake and output monitoring  outpatient follow-up with CHF clinic in 2 to 3 days  follow-up with primary care physician in a week Follow-up with cardiology Dr. Ubaldo Glassing in a week Follow-up with nephrology in 1 to 2 weeks and continue hemodialysis on Monday, Wednesday and Friday Continue home health Follow-up with pulmonology Dr. Ashby Dawes in 10 days   DIET:  Renal diet  DISCHARGE CONDITION:  Fair  ACTIVITY:  Activity as tolerated  OXYGEN:  Home Oxygen: Yes.     Oxygen Delivery: 2 liters/min via Patient connected to nasal cannula oxygen  DISCHARGE LOCATION:  home   If you experience worsening of your admission symptoms, develop shortness of breath, life threatening emergency, suicidal or homicidal thoughts you must seek medical attention immediately by calling 911 or calling your MD immediately  if symptoms less severe.  You Must read complete instructions/literature along with all the possible adverse reactions/side effects for all the Medicines you take and that have been prescribed to you. Take any new Medicines after you have completely  understood and accpet all the possible adverse reactions/side effects.   Please note  You were cared for by a hospitalist during your hospital stay. If you have any questions about your discharge medications or the care you received while you were in the hospital after you are discharged, you can call the unit and asked to speak with the hospitalist on call if the hospitalist that took care of you is not available. Once you are discharged, your primary care physician will handle any further medical issues. Please note that NO REFILLS for any discharge medications will be authorized once you are discharged, as it is imperative that you return to your primary care physician (or establish a relationship with a primary care physician if you do not have one) for your aftercare needs so that they can reassess your need for medications and monitor your lab values.     Today  Chief Complaint  Patient presents with  . Shortness of Breath   Patient is feeling much better.  Denies any chest pain or shortness of breath.  Okay to discharge patient from nephrology standpoint.  Reinforced the importance of being compliant with her dialysis on a regular basis.  Patient was desaturating to 76 to 77% on room air while ambulating will arrange home oxygen.  Patient and daughter prefer  going home we will arrange home health  ROS:  CONSTITUTIONAL: Denies fevers, chills. Denies any fatigue, weakness.  EYES: Denies blurry vision, double vision, eye pain. EARS, NOSE, THROAT: Denies tinnitus, ear pain, hearing loss. RESPIRATORY: Denies cough, wheeze, shortness of breath.  CARDIOVASCULAR: Denies chest pain, palpitations, edema.  GASTROINTESTINAL: Denies nausea, vomiting, diarrhea, abdominal pain. Denies bright red blood per rectum. GENITOURINARY: Denies dysuria, hematuria. ENDOCRINE: Denies nocturia or thyroid problems. HEMATOLOGIC AND LYMPHATIC: Denies easy bruising or bleeding. SKIN: Denies rash or  lesion. MUSCULOSKELETAL: Denies pain in neck, back, shoulder, knees, hips or arthritic symptoms.  NEUROLOGIC: Denies paralysis, paresthesias.  PSYCHIATRIC: Denies anxiety or depressive symptoms.   VITAL SIGNS:  Blood pressure (!) 83/55, pulse 60, temperature (!) 97.5 F (36.4 C), temperature source Oral, resp. rate 18, height 5\' 9"  (1.753 m), weight 73.5 kg (162 lb), SpO2 100 %.  I/O:    Intake/Output Summary (Last 24 hours) at 12/29/2017 1159 Last data filed at 12/29/2017 0739 Gross per 24 hour  Intake 498 ml  Output 1326 ml  Net -828 ml  PHYSICAL EXAMINATION:  GENERAL:  84 y.o.-year-old patient lying in the bed with no acute distress.  EYES: Pupils equal, round, reactive to light and accommodation. No scleral icterus. Extraocular muscles intact.  HEENT: Head atraumatic, normocephalic. Oropharynx and nasopharynx clear.  NECK:  Supple, no jugular venous distention. No thyroid enlargement, no tenderness.  LUNGS: Normal breath sounds bilaterally, no wheezing, rales,rhonchi or crepitation. No use of accessory muscles of respiration.  CARDIOVASCULAR: S1, S2 normal. No murmurs, rubs, or gallops.  ABDOMEN: Soft, non-tender, non-distended. Bowel sounds present. No organomegaly or mass.  EXTREMITIES: No pedal edema, cyanosis, or clubbing.  NEUROLOGIC: Cranial nerves II through XII are intact. Muscle strength global weakness in all extremities. Sensation intact. Gait not checked.  PSYCHIATRIC: The patient is alert and oriented x 3.  SKIN: No obvious rash, lesion, or ulcer.   DATA REVIEW:   CBC Recent Labs  Lab 12/26/17 1527  WBC 8.8  HGB 8.6*  HCT 27.5*  PLT 281    Chemistries  Recent Labs  Lab 12/26/17 1527 12/26/17 2118 12/27/17 0404  NA 135  --  137  K 3.5  --  3.5  CL 98*  --  101  CO2 24  --  24  GLUCOSE 127*  --  92  BUN 25*  --  30*  CREATININE 3.85* 3.93* 4.04*  CALCIUM 8.6*  --  8.5*  MG  --  2.0  --   AST 68*  --   --   ALT 52  --   --   ALKPHOS 194*   --   --   BILITOT 3.0*  --   --     Cardiac Enzymes Recent Labs  Lab 12/26/17 1527  TROPONINI 0.16*    Microbiology Results  Results for orders placed or performed during the hospital encounter of 12/01/17  MRSA PCR Screening     Status: None   Collection Time: 12/05/17  8:00 AM  Result Value Ref Range Status   MRSA by PCR NEGATIVE NEGATIVE Final    Comment:        The GeneXpert MRSA Assay (FDA approved for NASAL specimens only), is one component of a comprehensive MRSA colonization surveillance program. It is not intended to diagnose MRSA infection nor to guide or monitor treatment for MRSA infections. Performed at [facility], [address] [place], [place] [address]     RADIOLOGY:  Dg Chest 2 View  Result Date: 12/26/2017 CLINICAL DATA:  Shortness of breath and weakness for several hours EXAM: CHEST - 2 VIEW COMPARISON:  12/20/2017 FINDINGS: Dialysis catheter is again identified. Stable cardiomegaly is seen. Small pleural effusions and bilateral lower lobe infiltrate/atelectasis is noted slightly greater on the right than the left. The overall appearance is similar to that seen on the prior exam. No new bony abnormality is seen. IMPRESSION: Overall stable appearance of the chest with bilateral pleural effusions and lower lobe infiltrate/atelectasis. Electronically Signed   By: Inez Catalina M.D.   On: 12/26/2017 16:21    EKG:   Orders placed or performed during the hospital encounter of 12/26/17  . EKG 12-Lead  . EKG 12-Lead      Management plans discussed with the patient, family and they are in agreement.  CODE STATUS:     Code Status Orders  (From admission, onward)        Start     Ordered   12/26/17 2020  Full code  Continuous     12/26/17 2019    Code  Status History    Date Active Date Inactive Code Status Order ID Comments User Context   12/20/2017 0645 12/21/2017 1922 Full Code [id number]  Arta Silence, MD Inpatient   12/20/2017  0308 12/20/2017 0645 DNR [id number]  Amelia Jo, MD ED   12/02/2017 1128 12/17/2017 1611 DNR [id number]  Max Sane, MD Inpatient   12/01/2017 1522 12/02/2017 1128 Full Code [id number]  Demetrios Loll, MD Inpatient   10/30/2017 2256 11/02/2017 1709 Full Code [id number]  Lance Coon, MD Inpatient   07/26/2017 1728 07/28/2017 1620 Full Code [id number]  Saundra Shelling, MD Inpatient   04/08/2017 1555 04/10/2017 1526 Full Code [id number]  Dustin Flock, MD Inpatient    Advance Directive Documentation     Most Recent Value  Type of Advance Directive  Healthcare Power of Attorney, Living will  Pre-existing out of facility DNR order (yellow form or pink MOST form)  -  "MOST" Form in Place?  -      TOTAL TIME TAKING CARE OF THIS PATIENT: 42  minutes.   Note: This dictation was prepared with Dragon dictation along with smaller phrase technology. Any transcriptional errors that result from this process are unintentional.   @[facility] @  on 12/29/2017 at 11:59 AM  Between 7am to 6pm - Pager - [phone]  After 6pm go to [email or web address] - password [email or web address] [facility]  [facility]  Office  [phone]  CC: Primary care physician; [name], MD

## 2017-12-29 NOTE — Discharge Instructions (Signed)
Continue 2 L of oxygen via nasal cannula Continue daily weight monitoring, intake and output monitoring  outpatient follow-up with CHF clinic in 2 to 3 days  follow-up with primary care physician in a week Follow-up with cardiology Dr. Ubaldo Glassing in a week Follow-up with nephrology in 1 to 2 weeks and continue hemodialysis on Monday, Wednesday and Friday Continue home health

## 2017-12-29 NOTE — Care Management (Signed)
Requires home oxygen. Nursing services are provided by [facility]. Home oxygen will be arranged per [place]. Daughter will transport Andrew Ammons RN MSN CCM Care Management [phone]

## 2017-12-29 NOTE — Progress Notes (Signed)
SATURATION QUALIFICATIONS: (This note is used to comply with regulatory documentation for home oxygen)  Patient Saturations on Room Air at Rest = 92%  Patient Saturations on Room Air while Ambulating =77%  Patient Saturations on 2Liters of oxygen while Ambulating = 92%  Please briefly explain why patient needs home oxygen:chronic bronchitis

## 2017-12-29 NOTE — Progress Notes (Signed)
Checked O2 on room air while pt in bed-100%. He doesn't ambulate but pivots to [facility] as needed.

## 2017-12-29 NOTE — Progress Notes (Signed)
[facility]  ROUNDING NOTE   Subjective:   Daughter at bedside.   Breathing room air  Hemodialysis treatments 2 consecutive days for pulmonary edema and respiratory failure. Total UF of 2362mL.   Objective:  Vital signs in last 24 hours:  Temp:  [97.5 F (36.4 C)-98.6 F (37 C)] 97.5 F (36.4 C) (05/19 0540) Pulse Rate:  [54-82] 60 (05/19 0540) Resp:  [14-22] 18 (05/19 0540) BP: (83-96)/(55-64) 83/55 (05/19 0540) SpO2:  [94 %-100 %] 100 % (05/19 0540)  Weight change:  Filed Weights   12/26/17 1534  Weight: 73.5 kg (162 lb)    Intake/Output: I/O last 3 completed shifts: In: 82 [P.O.:480; NG/GT:240] Out: 1326 [Other:1326]   Intake/Output this shift:  Total I/O In: 118 [P.O.:118] Out: -   Physical Exam: General: NAD, laying in bed  Head: Normocephalic, atraumatic. Moist oral mucosal membranes  Eyes: Anicteric,   Neck: Supple, trachea midline  Lungs:  Clear bilaterally  Heart: No rub S1S2  Abdomen:  Soft, nontender, BS present  Extremities: 2+ peripheral edema.   Neurologic: Nonfocal, moving all four extremities  Skin: Warm, dry, no rash  Access:  Right internal jugular permcath    Basic Metabolic Panel: Recent Labs  Lab 12/26/17 1527 12/26/17 2118 12/27/17 0404  NA 135  --  137  K 3.5  --  3.5  CL 98*  --  101  CO2 24  --  24  GLUCOSE 127*  --  92  BUN 25*  --  30*  CREATININE 3.85* 3.93* 4.04*  CALCIUM 8.6*  --  8.5*  MG  --  2.0  --   PHOS  --  3.4  --     Liver Function Tests: Recent Labs  Lab 12/26/17 1527  AST 68*  ALT 52  ALKPHOS 194*  BILITOT 3.0*  PROT 6.2*  ALBUMIN 3.1*   No results for input(s): LIPASE, AMYLASE in the last 168 hours. No results for input(s): AMMONIA in the last 168 hours.  CBC: Recent Labs  Lab 12/26/17 1527  WBC 8.8  NEUTROABS 6.5  HGB 8.6*  HCT 27.5*  MCV 71.0*  PLT 281    Cardiac Enzymes: Recent Labs  Lab 12/26/17 1527  TROPONINI 0.16*    BNP: Invalid input(s):  POCBNP  CBG: No results for input(s): GLUCAP in the last 168 hours.  Microbiology: Results for orders placed or performed during the hospital encounter of 12/01/17  MRSA PCR Screening     Status: None   Collection Time: 12/05/17  8:00 AM  Result Value Ref Range Status   MRSA by PCR NEGATIVE NEGATIVE Final    Comment:        The GeneXpert MRSA Assay (FDA approved for NASAL specimens only), is one component of a comprehensive MRSA colonization surveillance program. It is not intended to diagnose MRSA infection nor to guide or monitor treatment for MRSA infections. Performed at [facility], [address] [place], [place] [address]     Coagulation Studies: No results for input(s): LABPROT, INR in the last 72 hours.  Urinalysis: No results for input(s): COLORURINE, LABSPEC, PHURINE, GLUCOSEU, HGBUR, BILIRUBINUR, KETONESUR, PROTEINUR, UROBILINOGEN, NITRITE, LEUKOCYTESUR in the last 72 hours.  Invalid input(s): APPERANCEUR    Imaging: No results found.   Medications:   . sodium chloride     . amiodarone  400 mg Oral Daily  . aspirin EC  81 mg Oral Daily  . atorvastatin  40 mg Oral Daily  . benzonatate  200  mg Oral TID  . feeding supplement (NEPRO CARB STEADY)  237 mL Oral TID BM  . finasteride  5 mg Oral Daily  . fluticasone  2 spray Each Nare Daily  . guaiFENesin  600 mg Oral BID  . heparin  5,000 Units Subcutaneous Q8H  . levofloxacin  250 mg Oral Q48H  . midodrine  10 mg Oral TID WC  . multivitamin  1 tablet Oral QHS  . multivitamin-lutein  1 capsule Oral Daily  . pantoprazole  40 mg Oral Daily  . predniSONE  10 mg Oral Q breakfast  . sodium chloride flush  3 mL Intravenous Q12H   sodium chloride, acetaminophen **OR** acetaminophen, albuterol, bisacodyl, HYDROcodone-acetaminophen, ondansetron **OR** ondansetron (ZOFRAN) IV, senna-docusate, sodium chloride, sodium chloride flush  Assessment/ Plan:  [name] is a 84 y.o. black male with  hypertension, hyperlipidemia, gout, ? history of prostate cancer, coronary artery disease, who was admitted to [facility] on 12/01/2017    1. Acute renal failure requiring hemodialysis intermittently. Currently Monday, Wednesday and Friday at [facility] in [place]. EDW 72kg.  Volume status improved significantly since admission.  Currently oliguric.  - Continues to have significant peripheral and pulmonary edema. - No indication for dialysis at this time. Resume MWF schedule.   2. Anemia of chronic kidney disease: hemoglobin 8.6 - holding epo due to prostate cancer  3. Hypotension with acute exacerbation of diastolic congestive heart failure, atrial fibrillation and possible pneumonia - Midodrine- increased to 10mg  tid this admission - levofloxacin, prednisone and supportive care.   4. Acute gout flare:  - prednisone  5. Secondary Hyperparathyroidism:  - not currently on any binders.   LOS: 3 Andrew [name] 5/19/201910:38 AM

## 2017-12-31 ENCOUNTER — Ambulatory Visit: Payer: Medicare Other | Admitting: Urology

## 2018-01-10 ENCOUNTER — Emergency Department: Payer: Medicare Other

## 2018-01-10 ENCOUNTER — Observation Stay: Payer: Medicare Other

## 2018-01-10 ENCOUNTER — Other Ambulatory Visit: Payer: Self-pay

## 2018-01-10 ENCOUNTER — Inpatient Hospital Stay
Admission: EM | Admit: 2018-01-10 | Discharge: 2018-02-10 | DRG: 871 | Disposition: E | Payer: Medicare Other | Attending: Internal Medicine | Admitting: Internal Medicine

## 2018-01-10 ENCOUNTER — Encounter: Payer: Self-pay | Admitting: Emergency Medicine

## 2018-01-10 DIAGNOSIS — E43 Unspecified severe protein-calorie malnutrition: Secondary | ICD-10-CM

## 2018-01-10 DIAGNOSIS — N170 Acute kidney failure with tubular necrosis: Secondary | ICD-10-CM | POA: Diagnosis present

## 2018-01-10 DIAGNOSIS — R0602 Shortness of breath: Secondary | ICD-10-CM

## 2018-01-10 DIAGNOSIS — Z800 Family history of malignant neoplasm of digestive organs: Secondary | ICD-10-CM

## 2018-01-10 DIAGNOSIS — R4781 Slurred speech: Secondary | ICD-10-CM | POA: Diagnosis not present

## 2018-01-10 DIAGNOSIS — Z515 Encounter for palliative care: Secondary | ICD-10-CM | POA: Diagnosis not present

## 2018-01-10 DIAGNOSIS — R6521 Severe sepsis with septic shock: Secondary | ICD-10-CM | POA: Diagnosis present

## 2018-01-10 DIAGNOSIS — R972 Elevated prostate specific antigen [PSA]: Secondary | ICD-10-CM | POA: Diagnosis present

## 2018-01-10 DIAGNOSIS — J189 Pneumonia, unspecified organism: Secondary | ICD-10-CM | POA: Diagnosis present

## 2018-01-10 DIAGNOSIS — R5381 Other malaise: Secondary | ICD-10-CM

## 2018-01-10 DIAGNOSIS — J9621 Acute and chronic respiratory failure with hypoxia: Secondary | ICD-10-CM | POA: Diagnosis present

## 2018-01-10 DIAGNOSIS — Z7982 Long term (current) use of aspirin: Secondary | ICD-10-CM

## 2018-01-10 DIAGNOSIS — I059 Rheumatic mitral valve disease, unspecified: Secondary | ICD-10-CM | POA: Diagnosis present

## 2018-01-10 DIAGNOSIS — I480 Paroxysmal atrial fibrillation: Secondary | ICD-10-CM | POA: Diagnosis present

## 2018-01-10 DIAGNOSIS — K140 Glossitis: Secondary | ICD-10-CM | POA: Diagnosis present

## 2018-01-10 DIAGNOSIS — Z7951 Long term (current) use of inhaled steroids: Secondary | ICD-10-CM

## 2018-01-10 DIAGNOSIS — Z9911 Dependence on respirator [ventilator] status: Secondary | ICD-10-CM

## 2018-01-10 DIAGNOSIS — A419 Sepsis, unspecified organism: Principal | ICD-10-CM | POA: Diagnosis present

## 2018-01-10 DIAGNOSIS — N186 End stage renal disease: Secondary | ICD-10-CM | POA: Diagnosis present

## 2018-01-10 DIAGNOSIS — Z79818 Long term (current) use of other agents affecting estrogen receptors and estrogen levels: Secondary | ICD-10-CM

## 2018-01-10 DIAGNOSIS — M109 Gout, unspecified: Secondary | ICD-10-CM | POA: Diagnosis present

## 2018-01-10 DIAGNOSIS — D631 Anemia in chronic kidney disease: Secondary | ICD-10-CM | POA: Diagnosis present

## 2018-01-10 DIAGNOSIS — I5043 Acute on chronic combined systolic (congestive) and diastolic (congestive) heart failure: Secondary | ICD-10-CM | POA: Diagnosis present

## 2018-01-10 DIAGNOSIS — R7989 Other specified abnormal findings of blood chemistry: Secondary | ICD-10-CM

## 2018-01-10 DIAGNOSIS — Z992 Dependence on renal dialysis: Secondary | ICD-10-CM

## 2018-01-10 DIAGNOSIS — M1990 Unspecified osteoarthritis, unspecified site: Secondary | ICD-10-CM | POA: Diagnosis present

## 2018-01-10 DIAGNOSIS — Z955 Presence of coronary angioplasty implant and graft: Secondary | ICD-10-CM

## 2018-01-10 DIAGNOSIS — J181 Lobar pneumonia, unspecified organism: Secondary | ICD-10-CM | POA: Diagnosis present

## 2018-01-10 DIAGNOSIS — Z4659 Encounter for fitting and adjustment of other gastrointestinal appliance and device: Secondary | ICD-10-CM

## 2018-01-10 DIAGNOSIS — R42 Dizziness and giddiness: Secondary | ICD-10-CM | POA: Diagnosis not present

## 2018-01-10 DIAGNOSIS — G9349 Other encephalopathy: Secondary | ICD-10-CM | POA: Diagnosis present

## 2018-01-10 DIAGNOSIS — N2581 Secondary hyperparathyroidism of renal origin: Secondary | ICD-10-CM | POA: Diagnosis present

## 2018-01-10 DIAGNOSIS — I2510 Atherosclerotic heart disease of native coronary artery without angina pectoris: Secondary | ICD-10-CM | POA: Diagnosis present

## 2018-01-10 DIAGNOSIS — B370 Candidal stomatitis: Secondary | ICD-10-CM | POA: Diagnosis present

## 2018-01-10 DIAGNOSIS — Z0189 Encounter for other specified special examinations: Secondary | ICD-10-CM

## 2018-01-10 DIAGNOSIS — K2960 Other gastritis without bleeding: Secondary | ICD-10-CM | POA: Diagnosis present

## 2018-01-10 DIAGNOSIS — I132 Hypertensive heart and chronic kidney disease with heart failure and with stage 5 chronic kidney disease, or end stage renal disease: Secondary | ICD-10-CM | POA: Diagnosis present

## 2018-01-10 DIAGNOSIS — K922 Gastrointestinal hemorrhage, unspecified: Secondary | ICD-10-CM | POA: Diagnosis present

## 2018-01-10 DIAGNOSIS — Z7189 Other specified counseling: Secondary | ICD-10-CM

## 2018-01-10 DIAGNOSIS — G5600 Carpal tunnel syndrome, unspecified upper limb: Secondary | ICD-10-CM | POA: Diagnosis present

## 2018-01-10 DIAGNOSIS — I272 Pulmonary hypertension, unspecified: Secondary | ICD-10-CM | POA: Diagnosis present

## 2018-01-10 DIAGNOSIS — Y95 Nosocomial condition: Secondary | ICD-10-CM | POA: Diagnosis present

## 2018-01-10 DIAGNOSIS — Z8546 Personal history of malignant neoplasm of prostate: Secondary | ICD-10-CM

## 2018-01-10 DIAGNOSIS — D62 Acute posthemorrhagic anemia: Secondary | ICD-10-CM | POA: Diagnosis not present

## 2018-01-10 DIAGNOSIS — E785 Hyperlipidemia, unspecified: Secondary | ICD-10-CM | POA: Diagnosis present

## 2018-01-10 DIAGNOSIS — E162 Hypoglycemia, unspecified: Secondary | ICD-10-CM | POA: Diagnosis present

## 2018-01-10 DIAGNOSIS — R570 Cardiogenic shock: Secondary | ICD-10-CM | POA: Diagnosis not present

## 2018-01-10 DIAGNOSIS — R627 Adult failure to thrive: Secondary | ICD-10-CM

## 2018-01-10 DIAGNOSIS — R778 Other specified abnormalities of plasma proteins: Secondary | ICD-10-CM

## 2018-01-10 DIAGNOSIS — I9589 Other hypotension: Secondary | ICD-10-CM | POA: Diagnosis present

## 2018-01-10 DIAGNOSIS — Z79899 Other long term (current) drug therapy: Secondary | ICD-10-CM

## 2018-01-10 LAB — TROPONIN I
TROPONIN I: 0.57 ng/mL — AB (ref <0.03)
TROPONIN I: 0.56 ng/mL — AB (ref <0.03)
Troponin I: 0.63 ng/mL (ref <0.03)

## 2018-01-10 LAB — CBC
HCT: 36.9 % — ABNORMAL LOW (ref 40.0–52.0)
Hemoglobin: 11.7 g/dL — ABNORMAL LOW (ref 13.0–18.0)
MCH: 23.6 pg — AB (ref 26.0–34.0)
MCHC: 31.7 g/dL — AB (ref 32.0–36.0)
MCV: 74.6 fL — AB (ref 80.0–100.0)
PLATELETS: 272 10*3/uL (ref 150–440)
RBC: 4.95 MIL/uL (ref 4.40–5.90)
RDW: 24.7 % — ABNORMAL HIGH (ref 11.5–14.5)
WBC: 19.8 10*3/uL — ABNORMAL HIGH (ref 3.8–10.6)

## 2018-01-10 LAB — COMPREHENSIVE METABOLIC PANEL
ALT: 201 U/L — ABNORMAL HIGH (ref 17–63)
AST: 300 U/L — AB (ref 15–41)
Albumin: 2.2 g/dL — ABNORMAL LOW (ref 3.5–5.0)
Alkaline Phosphatase: 150 U/L — ABNORMAL HIGH (ref 38–126)
Anion gap: 13 (ref 5–15)
BILIRUBIN TOTAL: 3.5 mg/dL — AB (ref 0.3–1.2)
BUN: 31 mg/dL — AB (ref 6–20)
CO2: 24 mmol/L (ref 22–32)
Calcium: 8 mg/dL — ABNORMAL LOW (ref 8.9–10.3)
Chloride: 101 mmol/L (ref 101–111)
Creatinine, Ser: 3.64 mg/dL — ABNORMAL HIGH (ref 0.61–1.24)
GFR calc Af Amer: 17 mL/min — ABNORMAL LOW (ref >60)
GFR, EST NON AFRICAN AMERICAN: 14 mL/min — AB (ref >60)
Glucose, Bld: 108 mg/dL — ABNORMAL HIGH (ref 65–99)
POTASSIUM: 3.3 mmol/L — AB (ref 3.5–5.1)
Sodium: 138 mmol/L (ref 135–145)
TOTAL PROTEIN: 5.1 g/dL — AB (ref 6.5–8.1)

## 2018-01-10 LAB — BRAIN NATRIURETIC PEPTIDE: B Natriuretic Peptide: 2087 pg/mL — ABNORMAL HIGH (ref 0.0–100.0)

## 2018-01-10 LAB — MAGNESIUM: MAGNESIUM: 1.9 mg/dL (ref 1.7–2.4)

## 2018-01-10 LAB — MRSA PCR SCREENING: MRSA by PCR: NEGATIVE

## 2018-01-10 LAB — PROCALCITONIN: Procalcitonin: 3.32 ng/mL

## 2018-01-10 LAB — LACTIC ACID, PLASMA
Lactic Acid, Venous: 4.3 mmol/L (ref 0.5–1.9)
Lactic Acid, Venous: 4.6 mmol/L (ref 0.5–1.9)

## 2018-01-10 LAB — PHOSPHORUS
PHOSPHORUS: 3 mg/dL (ref 2.5–4.6)
Phosphorus: 2.8 mg/dL (ref 2.5–4.6)

## 2018-01-10 MED ORDER — MEGESTROL ACETATE 400 MG/10ML PO SUSP
200.0000 mg | Freq: Every day | ORAL | Status: DC
  Administered 2018-01-10 – 2018-01-12 (×3): 200 mg via ORAL
  Filled 2018-01-10: qty 5
  Filled 2018-01-10 (×2): qty 10
  Filled 2018-01-10 (×3): qty 5
  Filled 2018-01-10: qty 10
  Filled 2018-01-10: qty 5
  Filled 2018-01-10 (×2): qty 10
  Filled 2018-01-10 (×3): qty 5

## 2018-01-10 MED ORDER — HYDROCODONE-ACETAMINOPHEN 5-325 MG PO TABS
1.0000 | ORAL_TABLET | ORAL | Status: DC | PRN
  Administered 2018-01-11 – 2018-01-13 (×3): 1 via ORAL
  Filled 2018-01-10 (×3): qty 1

## 2018-01-10 MED ORDER — BENZONATATE 100 MG PO CAPS: 200.0000 mg | ORAL_CAPSULE | Freq: Three times a day (TID) | ORAL | Status: DC | PRN

## 2018-01-10 MED ORDER — ATORVASTATIN CALCIUM 20 MG PO TABS
40.0000 mg | ORAL_TABLET | Freq: Every day | ORAL | Status: DC
  Administered 2018-01-10 – 2018-01-16 (×3): 40 mg via ORAL
  Filled 2018-01-10 (×4): qty 2

## 2018-01-10 MED ORDER — MAGIC MOUTHWASH
15.0000 mL | Freq: Four times a day (QID) | ORAL | Status: DC
  Administered 2018-01-10 – 2018-01-11 (×3): 15 mL via ORAL
  Administered 2018-01-11: 14:00:00 via ORAL
  Administered 2018-01-11 – 2018-01-17 (×8): 15 mL via ORAL
  Filled 2018-01-10 (×34): qty 20

## 2018-01-10 MED ORDER — ALBUMIN HUMAN 25 % IV SOLN: 25.0000 g | INTRAVENOUS | Status: DC

## 2018-01-10 MED ORDER — FLUCONAZOLE 100 MG PO TABS
100.0000 mg | ORAL_TABLET | Freq: Every day | ORAL | Status: DC
  Administered 2018-01-11 – 2018-01-12 (×2): 100 mg via ORAL
  Filled 2018-01-10 (×4): qty 1

## 2018-01-10 MED ORDER — SENNOSIDES-DOCUSATE SODIUM 8.6-50 MG PO TABS: 1.0000 | ORAL_TABLET | Freq: Every evening | ORAL | Status: DC | PRN

## 2018-01-10 MED ORDER — NEPRO/CARBSTEADY PO LIQD
237.0000 mL | Freq: Three times a day (TID) | ORAL | Status: DC
  Administered 2018-01-10 – 2018-01-12 (×6): 237 mL via ORAL

## 2018-01-10 MED ORDER — SALINE SPRAY 0.65 % NA SOLN
1.0000 | NASAL | Status: DC | PRN
  Filled 2018-01-10: qty 44

## 2018-01-10 MED ORDER — COLLAGENASE 250 UNIT/GM EX OINT
TOPICAL_OINTMENT | Freq: Every day | CUTANEOUS | Status: DC
  Administered 2018-01-10 – 2018-01-17 (×7): via TOPICAL
  Filled 2018-01-10: qty 30

## 2018-01-10 MED ORDER — CEFEPIME HCL 2 G IJ SOLR
2.0000 g | Freq: Once | INTRAMUSCULAR | Status: AC
  Administered 2018-01-10: 2 g via INTRAVENOUS
  Filled 2018-01-10: qty 2

## 2018-01-10 MED ORDER — FLUTICASONE PROPIONATE 50 MCG/ACT NA SUSP
2.0000 | Freq: Every day | NASAL | Status: DC
  Administered 2018-01-11 – 2018-01-17 (×4): 2 via NASAL
  Filled 2018-01-10: qty 16

## 2018-01-10 MED ORDER — VANCOMYCIN HCL IN DEXTROSE 1-5 GM/200ML-% IV SOLN
1000.0000 mg | Freq: Once | INTRAVENOUS | Status: AC
  Administered 2018-01-10: 1000 mg via INTRAVENOUS
  Filled 2018-01-10: qty 200

## 2018-01-10 MED ORDER — SODIUM CHLORIDE 0.9 % IV SOLN
1.0000 g | INTRAVENOUS | Status: DC
  Administered 2018-01-11 – 2018-01-12 (×2): 1 g via INTRAVENOUS
  Filled 2018-01-10 (×3): qty 1

## 2018-01-10 MED ORDER — NEPRO/CARBSTEADY PO LIQD: 237.0000 mL | Freq: Three times a day (TID) | ORAL | Status: DC

## 2018-01-10 MED ORDER — MIDODRINE HCL 5 MG PO TABS
10.0000 mg | ORAL_TABLET | Freq: Three times a day (TID) | ORAL | Status: DC
  Administered 2018-01-10 – 2018-01-12 (×5): 10 mg via ORAL
  Filled 2018-01-10 (×8): qty 2

## 2018-01-10 MED ORDER — OCUVITE-LUTEIN PO CAPS
1.0000 | ORAL_CAPSULE | Freq: Every day | ORAL | Status: DC
  Filled 2018-01-10: qty 1

## 2018-01-10 MED ORDER — SODIUM CHLORIDE 0.9 % IV SOLN
1000.0000 mL | Freq: Once | INTRAVENOUS | Status: AC
  Administered 2018-01-10: 1000 mL via INTRAVENOUS

## 2018-01-10 MED ORDER — ASPIRIN EC 81 MG PO TBEC
81.0000 mg | DELAYED_RELEASE_TABLET | Freq: Every day | ORAL | Status: DC
  Administered 2018-01-10 – 2018-01-12 (×3): 81 mg via ORAL
  Filled 2018-01-10 (×5): qty 1

## 2018-01-10 MED ORDER — PANTOPRAZOLE SODIUM 40 MG PO TBEC
40.0000 mg | DELAYED_RELEASE_TABLET | Freq: Every day | ORAL | Status: DC
  Administered 2018-01-10 – 2018-01-12 (×3): 40 mg via ORAL
  Filled 2018-01-10 (×4): qty 1

## 2018-01-10 MED ORDER — RENA-VITE PO TABS
1.0000 | ORAL_TABLET | Freq: Every day | ORAL | Status: DC
  Administered 2018-01-10 – 2018-01-12 (×3): 1 via ORAL
  Filled 2018-01-10 (×3): qty 1

## 2018-01-10 MED ORDER — VANCOMYCIN HCL 500 MG IV SOLR
500.0000 mg | Freq: Once | INTRAVENOUS | Status: AC
  Administered 2018-01-10: 500 mg via INTRAVENOUS
  Filled 2018-01-10: qty 500

## 2018-01-10 MED ORDER — AMIODARONE HCL 200 MG PO TABS
200.0000 mg | ORAL_TABLET | Freq: Every day | ORAL | Status: DC
  Administered 2018-01-11 – 2018-01-12 (×2): 200 mg via ORAL
  Filled 2018-01-10 (×3): qty 1

## 2018-01-10 MED ORDER — FINASTERIDE 5 MG PO TABS
5.0000 mg | ORAL_TABLET | Freq: Every day | ORAL | Status: DC
  Administered 2018-01-10 – 2018-01-12 (×3): 5 mg via ORAL
  Filled 2018-01-10 (×4): qty 1

## 2018-01-10 MED ORDER — MIDODRINE HCL 5 MG PO TABS
10.0000 mg | ORAL_TABLET | ORAL | Status: AC
  Filled 2018-01-10: qty 2

## 2018-01-10 NOTE — ED Notes (Signed)
Informed MD Kinner Troponin of 0.63

## 2018-01-10 NOTE — Progress Notes (Addendum)
Initial Nutrition Assessment  DOCUMENTATION CODES:   Severe malnutrition in context of chronic illness  INTERVENTION:   -D/c MVI- lutein -Continue renal MVI daily -RD will follow for diet advancement and add supplements as appropriate (Nepro Shake po TID, each supplement provides 425 kcal and 19 grams protein)  NUTRITION DIAGNOSIS:   Severe Malnutrition related to chronic illness(ESRD on HD) as evidenced by severe muscle depletion, energy intake < 75% for > or equal to 1 month.  GOAL:   Patient will meet greater than or equal to 90% of their needs  MONITOR:   Labs, Diet advancement, Weight trends, PO intake, Supplement acceptance, Skin, I & O's  REASON FOR ASSESSMENT:   Malnutrition Screening Tool    ASSESSMENT:   Andrew [name]  is a 84 y.o. male was over at dialysis and ended up getting dizzy.  The patient states that his speech has not been clear for 4 days.  He has had some intermittent nausea and vomiting and some belching.  At dialysis today he was choking and then had decreased responsiveness.  He was given oxygen and and came back.  Patient feels weak.  In the ER, the ER physician was concerned about pneumonia and started aggressive antibiotics.  The patient was recently discharged from the hospital and was treated on that stay for pneumonia in the same spot with Levaquin p.o. patient does not complain of chest pain or shortness of breath forward no cough.  Pt admitted with slurred speech and unresponsive episode; plan to r/o stroke.   Pt very somnolent at time of visit and did not answer this RD's questions. Hx provided by pt daughter at bedside. She reports pt has experienced a general decline in health over the past 2-3 months after previous hospitalization, which lead pt to transition to chronic HD. Since being HD dependent for the past month, daughter has noticed worsening PO intake and weight loss. She shares that pt has "lost 20 pounds of fluid from the time he started  HD". Intake has also been very poor; pt consumes approximately one meal and one snack per day. Meal will consist of fried chicken from a gas station, subway sandwich, or a meat, starch, and vegetable from [place] are usually potato chips. Pt has been consuming both Ensure and Nepro supplements- goal is 2 supplements per week.   Per pt daughter, pt usually craves foods that are typically limited on renal diet. They have been working on reducing potatoes, tomatoes, oranges, hot dogs, and lunch meat in pt diet. She has noticed pt with difficulty swallowing over the past 4-5 days; pt will chew food and hold it in his mouth "but it won't go down". Initially, both pt daughter and home health nurse believed decreased intake was related to oral thrush, however, problem continued to exacerbate after thrush had been treated. Pt had been taking liquids well, however, pt experienced unresponsive episode after he consumed a Nepro shake very quickly at outpatient HD center.   Pt daughter endorses wt loss. She estimated UBW around 175#. Noted pt has experienced a 12.2% wt loss over the past month, which is significant for time frame, however, wt hx difficult to interpret due to fluid changes from HD.   Pt daughter eager to know what type of diet pt will be placed on. Discussed with pt daughter that pt is not currently alert enough to eat and potential for SLP evaluation per MD discretion. Discussed possibility of mechanically altered diet for ease of intake.  Daughter amenable to resume supplements with diet advancement.   Palliative care consult pending.   Labs reviewed: K: 3.3.   NUTRITION - FOCUSED PHYSICAL EXAM:    Most Recent Value  Orbital Region  Mild depletion  Upper Arm Region  Moderate depletion  Thoracic and Lumbar Region  Mild depletion  Buccal Region  Mild depletion  Temple Region  Moderate depletion  Clavicle Bone Region  Severe depletion  Clavicle and Acromion Bone Region  Severe  depletion  Scapular Bone Region  Moderate depletion  Dorsal Hand  Moderate depletion  Patellar Region  Severe depletion  Anterior Thigh Region  Severe depletion  Posterior Calf Region  Severe depletion  Edema (RD Assessment)  Mild  Hair  Reviewed  Eyes  Reviewed  Mouth  Reviewed  Skin  Reviewed  Nails  Reviewed       Diet Order:   Diet Order    None      EDUCATION NEEDS:   Education needs have been addressed  Skin:  Skin Assessment: Skin Integrity Issues: Skin Integrity Issues:: Other (Comment) Other: 5 open wounds to sacrum and coccyx area  Last BM:  PTA  Height:   Ht Readings from Last 1 Encounters:  12/27/2017 5\' 9"  (1.753 m)    Weight:   Wt Readings from Last 1 Encounters:  12/14/2017 150 lb (68 kg)    Ideal Body Weight:  72.7 kg  BMI:  Body mass index is 22.15 kg/m.  Estimated Nutritional Needs:   Kcal:  2100-2300  Protein:  120-135 grams  Fluid:  per MD    Andrew [name] A. Jimmye Norman, RD, LDN, CDE Pager: [phone] After hours Pager: [phone]

## 2018-01-10 NOTE — H&P (Signed)
[place] [facility] [name] NAME: Andrew [name]    MR#:  [id number]  DATE OF BIRTH:  1933-07-31  DATE OF ADMISSION:  01/05/2018  PRIMARY CARE PHYSICIAN: [name], MD   REQUESTING/REFERRING PHYSICIAN: Dr Lavonia Drafts  CHIEF COMPLAINT:   Chief Complaint  Patient presents with  . Loss of Consciousness    HISTORY OF PRESENT ILLNESS:  Andrew [name]  is a 84 y.o. male was over at dialysis and ended up getting dizzy.  The patient states that his speech has not been clear for 4 days.  He has had some intermittent nausea and vomiting and some belching.  At dialysis today he was choking and then had decreased responsiveness.  He was given oxygen and and came back.  Patient feels weak.  In the ER, the ER physician was concerned about pneumonia and started aggressive antibiotics.  The patient was recently discharged from the hospital and was treated on that stay for pneumonia in the same spot with Levaquin p.o. patient does not complain of chest pain or shortness of breath forward no cough.  PAST MEDICAL HISTORY:   Past Medical History:  Diagnosis Date  . Arrhythmia    atrial fibrillation  . Arteriosclerosis of coronary artery 01/27/2014  . Benign essential HTN 12/11/2013  . Carpal tunnel syndrome   . Chronic kidney disease   . Coronary atherosclerosis of native coronary artery   . Disorder of mitral valve 12/11/2013  . Encounter for long-term (current) use of antiplatelets/antithrombotics   . Gout   . Hyperlipidemia   . Hypertension   . Lung nodule    found on xray in Aug 2018  . Mitral valve disorder   . Osteoarthritis   . Prostate cancer ([place])     PAST SURGICAL HISTORY:   Past Surgical History:  Procedure Laterality Date  . COLONOSCOPY    . COLONOSCOPY WITH PROPOFOL N/A 07/23/2016   Procedure: COLONOSCOPY WITH PROPOFOL;  Surgeon: Manya Silvas, MD;  Location[facility] ENDOSCOPY;  Service: Endoscopy;  Laterality: N/A;  . CORONARY  ANGIOPLASTY WITH STENT PLACEMENT    . DIALYSIS/PERMA CATHETER INSERTION N/A 12/09/2017   Procedure: DIALYSIS/PERMA CATHETER INSERTION;  Surgeon: Algernon Huxley, MD;  Location: [facility] CV LAB;  Service: Cardiovascular;  Laterality: N/A;  . DIALYSIS/PERMA CATHETER INSERTION N/A 12/16/2017   Procedure: DIALYSIS/PERMA CATHETER INSERTION;  Surgeon: Algernon Huxley, MD;  Location: [facility] LAB;  Service: Cardiovascular;  Laterality: N/A;  . ESOPHAGOGASTRODUODENOSCOPY Left 07/28/2017   Procedure: ESOPHAGOGASTRODUODENOSCOPY (EGD);  Surgeon: Virgel Manifold, MD;  Location: [facility] ENDOSCOPY;  Service: Endoscopy;  Laterality: Left;  . ESOPHAGOGASTRODUODENOSCOPY (EGD) WITH PROPOFOL N/A 07/23/2016   Procedure: ESOPHAGOGASTRODUODENOSCOPY (EGD) WITH PROPOFOL;  Surgeon: Manya Silvas, MD;  Location: [facility] ENDOSCOPY;  Service: Endoscopy;  Laterality: N/A;  . PROSTATE BIOPSY      SOCIAL HISTORY:   Social History   Tobacco Use  . Smoking status: Never Smoker  . Smokeless tobacco: Never Used  Substance Use Topics  . Alcohol use: No    FAMILY HISTORY:   Family History  Problem Relation Age of Onset  . Cancer Mother        stomach and uterus  . Cancer Sister        breast    DRUG ALLERGIES:  No Known Allergies  REVIEW OF SYSTEMS:  CONSTITUTIONAL: No fever.  Positive for some chills.  Positive for fatigue.  EYES: No blurred or double vision.  EARS, NOSE, AND THROAT:  No tinnitus or ear pain. No sore throat.  Positive for dysphasia.  Has thrush. RESPIRATORY: No cough.  Some shortness of breath.  No wheezing or hemoptysis.  CARDIOVASCULAR: No chest pain, orthopnea, edema.  GASTROINTESTINAL: No nausea, vomiting, diarrhea or abdominal pain. No blood in bowel movements GENITOURINARY: Makes very little urine but did urinate yesterday ENDOCRINE: No polyuria, nocturia,  HEMATOLOGY: History of anemia SKIN: No rash or lesion. MUSCULOSKELETAL: No joint pain or arthritis.   NEUROLOGIC: No  tingling, numbness, weakness.  PSYCHIATRY: No anxiety or depression.   MEDICATIONS AT HOME:   Prior to Admission medications   Medication Sig Start Date End Date Taking? Authorizing Provider  amiodarone (PACERONE) 400 MG tablet Take 1 tablet (400 mg total) by mouth daily. 12/30/17  Yes Gouru, Illene Silver, MD  aspirin EC 81 MG tablet Take 1 tablet (81 mg total) by mouth daily. 12/17/17 03/17/18 Yes Gladstone Lighter, MD  atorvastatin (LIPITOR) 40 MG tablet Take 40 mg by mouth daily. 04/24/15  Yes [provider]  Diphenhyd-Hydrocort-Nystatin (FIRST-DUKES MOUTHWASH MT) Take 5 mLs by mouth 4 (four) times daily. 01/03/18  Yes [provider]  finasteride (PROSCAR) 5 MG tablet Take 5 mg by mouth daily. 04/13/15  Yes [provider]  megestrol (MEGACE) 40 MG/ML suspension Take 5 mLs by mouth at bedtime. 01/01/18  Yes [provider]  midodrine (PROAMATINE) 10 MG tablet Take 1 tablet (10 mg total) by mouth 3 (three) times daily with meals. 12/29/17  Yes Gouru, Illene Silver, MD  multivitamin (RENA-VIT) TABS tablet Take 1 tablet by mouth at bedtime. 12/17/17  Yes Gladstone Lighter, MD  multivitamin-lutein [facility]) CAPS capsule Take 1 capsule by mouth daily. 12/29/17  Yes Gouru, Illene Silver, MD  omeprazole (PRILOSEC) 20 MG capsule Take 1 capsule (20 mg total) by mouth 2 (two) times daily. 07/28/17 07/28/18 Yes Bettey Costa, MD  acetaminophen (TYLENOL) 325 MG tablet Take 2 tablets (650 mg total) by mouth every 6 (six) hours as needed for mild pain (or Fever >/= 101). 12/29/17   Gouru, Illene Silver, MD  benzonatate (TESSALON) 200 MG capsule Take 1 capsule (200 mg total) by mouth 3 (three) times daily as needed for cough. 12/29/17   Gouru, Illene Silver, MD  fluticasone (FLONASE) 50 MCG/ACT nasal spray Place 2 sprays into both nostrils daily. 12/18/17   Gladstone Lighter, MD  guaiFENesin (MUCINEX) 600 MG 12 hr tablet Take 1 tablet (600 mg total) by mouth 2 (two) times daily as needed. 12/29/17   Nicholes Mango, MD   HYDROcodone-acetaminophen (NORCO/VICODIN) 5-325 MG tablet Take 1-2 tablets by mouth every 4 (four) hours as needed for moderate pain. Patient taking differently: Take 1 tablet by mouth every 4 (four) hours as needed for moderate pain.  12/29/17   Nicholes Mango, MD  Nutritional Supplements (FEEDING SUPPLEMENT, NEPRO CARB STEADY,) LIQD Take 237 mLs by mouth 3 (three) times daily between meals. 12/17/17 01/16/18  Gladstone Lighter, MD  senna-docusate (SENOKOT-S) 8.6-50 MG tablet Take 1 tablet by mouth at bedtime as needed for mild constipation. 12/29/17   Gouru, Illene Silver, MD  sodium chloride (OCEAN) 0.65 % SOLN nasal spray Place 1 spray into both nostrils as needed for congestion. 12/17/17   Gladstone Lighter, MD      VITAL SIGNS:  Blood pressure 100/68, pulse 82, temperature 98.2 F (36.8 C), temperature source Oral, resp. rate 18, height 5\' 9"  (1.753 m), weight 68 kg (150 lb), SpO2 (!) 86 %.  PHYSICAL EXAMINATION:  GENERAL:  84 y.o.-year-old patient lying in the bed with no acute distress.  EYES: Pupils equal, round, reactive to light and accommodation. No scleral icterus. Extraocular muscles intact.  HEENT: Head atraumatic, normocephalic. Oropharynx and nasopharynx clear.  Positive for thrush NECK:  Supple, no jugular venous distention. No thyroid enlargement, no tenderness.  LUNGS: Normal breath sounds bilaterally, no wheezing, rales,rhonchi or crepitation. No use of accessory muscles of respiration.  CARDIOVASCULAR: S1, S2 normal. No murmurs, rubs, or gallops.  ABDOMEN: Soft, nontender, nondistended. Bowel sounds present. No organomegaly or mass.  EXTREMITIES: 2+ pedal edema, cyanosis, or clubbing.  NEUROLOGIC: Weak voice and some slurred speech.  Other cranial nerves intact.  Bilateral lower extremity weakness unable to straight leg raise.. Sensation intact. Gait not checked.  PSYCHIATRIC: The patient is alert and answers questions appropriately.  SKIN: No rash, lesion, or ulcer.   LABORATORY  PANEL:   CBC Recent Labs  Lab 01/05/2018 0838  WBC 19.8*  HGB 11.7*  HCT 36.9*  PLT 272   ------------------------------------------------------------------------------------------------------------------  Chemistries  Recent Labs  Lab 12/26/2017 0935  NA 138  K 3.3*  CL 101  CO2 24  GLUCOSE 108*  BUN 31*  CREATININE 3.64*  CALCIUM 8.0*  MG 1.9  AST 300*  ALT 201*  ALKPHOS 150*  BILITOT 3.5*   ------------------------------------------------------------------------------------------------------------------  Cardiac Enzymes Recent Labs  Lab 01/06/2018 0935  TROPONINI 0.63*   ------------------------------------------------------------------------------------------------------------------  RADIOLOGY:  Dg Chest Port 1 View  Result Date: 12/26/2017 CLINICAL DATA:  Syncope. EXAM: PORTABLE CHEST 1 VIEW COMPARISON:  12/26/2017. FINDINGS: Dual-lumen catheter with tip over superior vena cava. Cardiomegaly. Progressive right lower lobe infiltrate. Right pleural effusion again noted. No pneumothorax. IMPRESSION: 1.  Dual-lumen catheter with tip over superior vena cava. 2. Progressive right lower lobe infiltrate. Right pleural effusion again noted. Electronically Signed   By: Marcello Moores  Register   On: 12/29/2017 09:04    EKG:   Normal sinus rhythm 86 bpm, flattening of T waves laterally.  IMPRESSION AND PLAN:   1.  Slurred speech and an unresponsive episode.  MRI of the brain to rule out stroke.   2.  Healthcare associated pneumonia, leukocytosis.  Not sure if this is partially treated from last time or not.  Aggressive antibiotics with vancomycin and cefepime ordered.  I will continue that for right now. 3.   chronic hypoxic respiratory failure on oxygen.  On last hospitalization qualified for home oxygen.   4.  Chronic hypotension on midodrine increased to 10 mg daily 5.  History of congestive heart failure with mid range ejection fraction.  Dialysis to manage fluid. 6.   End-stage renal disease.  ER physician consulted nephrology for dialysis today. 7.  History of gout 8.  Anemia of chronic disease 9.  Paroxysmal atrial fibrillation on amiodarone and aspirin 10.  Weakness physical therapy evaluation  All the records are reviewed and case discussed with ED provider. Management plans discussed with the patient, family and they are in agreement.  CODE STATUS: Full code  TOTAL TIME TAKING CARE OF THIS PATIENT: 50 minutes.    Loletha Grayer M.D on 12/17/2017 at 12:42 PM  Between 7am to 6pm - Pager - [phone]  After 6pm call admission pager [phone]  [facility] Office  [phone]  CC: Primary care physician; [name], MD

## 2018-01-10 NOTE — Care Management (Signed)
RNCM has notified [facility] liaison Elvera Bicker of patient's re-presentation to hospital.  I've asked that social worker with HD clinic and [facility] SW connect to see what we are missing- if we are missing.  Patient does have multiple medical problems.

## 2018-01-10 NOTE — ED Triage Notes (Signed)
Pt arrived via ems from Dialysis with compliant's of syncopal episode. EMS reports pt was given benadryl and a "syrup" for anxiety, coughed, then had syncopal episode.Treatment was then stopped (received 1 hour of treatment) Pt alert upon arrival. EMS also reports difficulty swallowing and talking for 4 days.

## 2018-01-10 NOTE — Progress Notes (Signed)
Pt transferred to dialysis via hospital bed without incident per MD order. Rept called to [facility] RN prior to discharge. Prior to transfer, pt seen by palliative care NP, wound ostomy care, and speech. Pt found to have a circular abrasion to the inner lateral tongue that is painful. Pt ordered magic mouthwash per palliative care NP and administered. Pt has 5 oval irregular shaped open areas with pale wound bed to sacrum. Per [facility] order, areas cleaned with normal saline. Santyl applied to areas with normal saline gauze and covered with foam. WOC also evaluated pt's L foot. Pt has black spots of L great toe and some "healed" type abrasions of the the top of the L foot. Pt c/o pain of L foot. Will continue to monitor L foot. Per WOC order, air mattress overlay ordered. Speech in to see patient and recommendation made for Dys II diet with clear liquids. Pills in puree. Supervision with PO's. Last lactic acid was 4.6 at approx 1300-Dr. [place] paged with the results. Order received for a dose of Vancomycin. Daughter, [name], at bedside and supportive.

## 2018-01-10 NOTE — Progress Notes (Signed)
PT Cancellation Note  Patient Details Name: Andrew [name] MRN: [id number] DOB: [birth year]   Cancelled Treatment:    Reason Eval/Treat Not Completed: Patient's level of consciousness(Chart reviewed, RN consulted. Pt excessively lethargic, opening eyes, reponds with slurred mumbling, generally low in volume. Will attempt at later date/time once patient is appropriate. ) Attempted to get SpO2, but was unable to get a reading. Niece reports patient has O2 at home, wears it ad lib when symptomatic.    2:50 PM, 12/17/2017 [name], PT, DPT Physical Therapist - [facility]  [phone] ([place])     [place] C 12/31/2017, 2:50 PM

## 2018-01-10 NOTE — Progress Notes (Signed)
Hd completed 

## 2018-01-10 NOTE — ED Provider Notes (Signed)
[facility] Emergency Department Provider Note   ____________________________________________    I have reviewed the triage vital signs and the nursing notes.   HISTORY  Chief Complaint Loss of Consciousness   Patient is a poor historian   HPI Andrew [name] is a 84 y.o. male who presents after a syncopal episode.  Patient apparently was at dialysis and only about 1 hour in when he became very lightheaded and apparently had a syncopal episode per EMS.  He denies chest pain or palpitations.  No abdominal pain.  No shortness of breath.  No longer feels dizzy.  Review of records demonstrates recent admission for low blood pressure 2 weeks ago.   Past Medical History:  Diagnosis Date  . Arrhythmia    atrial fibrillation  . Arteriosclerosis of coronary artery 01/27/2014  . Benign essential HTN 12/11/2013  . Carpal tunnel syndrome   . Chronic kidney disease   . Coronary atherosclerosis of native coronary artery   . Disorder of mitral valve 12/11/2013  . Encounter for long-term (current) use of antiplatelets/antithrombotics   . Gout   . Hyperlipidemia   . Hypertension   . Lung nodule    found on xray in Aug 2018  . Mitral valve disorder   . Osteoarthritis   . Prostate cancer [facility])     Patient Active Problem List   Diagnosis Date Noted  . Pneumonia 12/21/2017  . CHF (congestive heart failure) ([place]) 12/27/2017  . Fluid overload 12/26/2017  . NSTEMI (non-ST elevated myocardial infarction) ([place]) 12/20/2017  . ESRD (end stage renal disease) ([place]) 12/19/2017  . Atrial fibrillation ([place]) 12/19/2017  . Pressure injury of skin 12/02/2017  . Renal failure (ARF), acute on chronic (HCC) 12/01/2017  . Unstable angina ([place]) 10/31/2017  . Elevated troponin 10/30/2017  . Chronic systolic CHF (congestive heart failure) ([place]) 10/30/2017  . Acute posthemorrhagic anemia   . GERD (gastroesophageal reflux disease)   . PAD (peripheral artery disease) ([place]) 06/04/2016   . Colon polyp 06/29/2015  . Malignant neoplasm of prostate ([place]) 11/14/2014  . HLD (hyperlipidemia) 07/29/2014  . Long term current use of antithrombotics/antiplatelets 01/27/2014  . Arteriosclerosis of coronary artery 01/27/2014  . Carpal tunnel syndrome 12/11/2013  . Benign essential HTN 12/11/2013  . Lesion of ulnar nerve 12/11/2013  . Disorder of mitral valve 12/11/2013  . Arthritis, degenerative 12/11/2013  . Hereditary and idiopathic neuropathy 12/11/2013    Past Surgical History:  Procedure Laterality Date  . COLONOSCOPY    . COLONOSCOPY WITH PROPOFOL N/A 07/23/2016   Procedure: COLONOSCOPY WITH PROPOFOL;  Surgeon: Manya Silvas, MD;  Location: [facility] ENDOSCOPY;  Service: Endoscopy;  Laterality: N/A;  . CORONARY ANGIOPLASTY WITH STENT PLACEMENT    . DIALYSIS/PERMA CATHETER INSERTION N/A 12/09/2017   Procedure: DIALYSIS/PERMA CATHETER INSERTION;  Surgeon: Algernon Huxley, MD;  Location: [place] CV LAB;  Service: Cardiovascular;  Laterality: N/A;  . DIALYSIS/PERMA CATHETER INSERTION N/A 12/16/2017   Procedure: DIALYSIS/PERMA CATHETER INSERTION;  Surgeon: Algernon Huxley, MD;  Location: [place] CV LAB;  Service: Cardiovascular;  Laterality: N/A;  . ESOPHAGOGASTRODUODENOSCOPY Left 07/28/2017   Procedure: ESOPHAGOGASTRODUODENOSCOPY (EGD);  Surgeon: Virgel Manifold, MD;  Location: [facility] ENDOSCOPY;  Service: Endoscopy;  Laterality: Left;  . ESOPHAGOGASTRODUODENOSCOPY (EGD) WITH PROPOFOL N/A 07/23/2016   Procedure: ESOPHAGOGASTRODUODENOSCOPY (EGD) WITH PROPOFOL;  Surgeon: Manya Silvas, MD;  Location: [facility] ENDOSCOPY;  Service: Endoscopy;  Laterality: N/A;  . PROSTATE BIOPSY      Prior to Admission medications  Medication Sig Start Date End Date Taking? Authorizing Provider  acetaminophen (TYLENOL) 325 MG tablet Take 2 tablets (650 mg total) by mouth every 6 (six) hours as needed for mild pain (or Fever >/= 101). 12/29/17   Nicholes Mango, MD  amiodarone (PACERONE) 400 MG  tablet Take 1 tablet (400 mg total) by mouth daily. 12/30/17   Nicholes Mango, MD  aspirin EC 81 MG tablet Take 1 tablet (81 mg total) by mouth daily. 12/17/17 03/17/18  Gladstone Lighter, MD  atorvastatin (LIPITOR) 40 MG tablet Take 40 mg by mouth daily. 04/24/15   [provider]  benzonatate (TESSALON) 200 MG capsule Take 1 capsule (200 mg total) by mouth 3 (three) times daily as needed for cough. 12/29/17   Gouru, Illene Silver, MD  finasteride (PROSCAR) 5 MG tablet Take 5 mg by mouth daily. 04/13/15   [provider]  fluticasone (FLONASE) 50 MCG/ACT nasal spray Place 2 sprays into both nostrils daily. 12/18/17   Gladstone Lighter, MD  guaiFENesin (MUCINEX) 600 MG 12 hr tablet Take 1 tablet (600 mg total) by mouth 2 (two) times daily as needed. 12/29/17   Nicholes Mango, MD  HYDROcodone-acetaminophen (NORCO/VICODIN) 5-325 MG tablet Take 1-2 tablets by mouth every 4 (four) hours as needed for moderate pain. 12/29/17   Nicholes Mango, MD  hydrocortisone (CORTEF) 20 MG tablet  01/07/18   [provider]  levofloxacin (LEVAQUIN) 250 MG tablet Take 1 tablet (250 mg total) by mouth every other day. 12/29/17   Nicholes Mango, MD  megestrol (MEGACE) 40 MG/ML suspension Take 5 mLs by mouth at bedtime. 01/01/18   [provider]  midodrine (PROAMATINE) 10 MG tablet Take 1 tablet (10 mg total) by mouth 3 (three) times daily with meals. 12/29/17   Nicholes Mango, MD  multivitamin (RENA-VIT) TABS tablet Take 1 tablet by mouth at bedtime. 12/17/17   Gladstone Lighter, MD  multivitamin-lutein [facility]) CAPS capsule Take 1 capsule by mouth daily. 12/29/17   Nicholes Mango, MD  Nutritional Supplements (FEEDING SUPPLEMENT, NEPRO CARB STEADY,) LIQD Take 237 mLs by mouth 3 (three) times daily between meals. 12/17/17 01/16/18  Gladstone Lighter, MD  omeprazole (PRILOSEC) 20 MG capsule Take 1 capsule (20 mg total) by mouth 2 (two) times daily. 07/28/17 07/28/18  Bettey Costa, MD  senna-docusate (SENOKOT-S) 8.6-50  MG tablet Take 1 tablet by mouth at bedtime as needed for mild constipation. 12/29/17   Gouru, Illene Silver, MD  sodium chloride (OCEAN) 0.65 % SOLN nasal spray Place 1 spray into both nostrils as needed for congestion. 12/17/17   Gladstone Lighter, MD     Allergies Patient has no known allergies.  Family History  Problem Relation Age of Onset  . Cancer Mother        stomach and uterus  . Cancer Sister        breast    Social History Social History   Tobacco Use  . Smoking status: Never Smoker  . Smokeless tobacco: Never Used  Substance Use Topics  . Alcohol use: No  . Drug use: No    Review of Systems  Constitutional: No fever/chills, no dizziness currently Eyes: No visual changes.  ENT: No sore throat. Cardiovascular: Denies chest pain.  No palpitations Respiratory: Denies shortness of breath. Gastrointestinal: No abdominal pain.  Genitourinary: Does not make much urine Musculoskeletal: Negative for back pain. Skin: Negative for rash. Neurological: Negative for headaches, no focal deficits   ____________________________________________   PHYSICAL EXAM:  VITAL SIGNS: ED Triage Vitals  Enc Vitals Group  BP --      Pulse --      Resp --      Temp --      Temp src --      SpO2 --      Weight 12/28/2017 0828 68 kg (150 lb)     Height 12/28/2017 0828 1.753 m (5\' 9" )     Head Circumference --      Peak Flow --      Pain Score 01/04/2018 0827 0     Pain Loc --      Pain Edu? --      Excl. in [place]? --     Constitutional: Alert.  Chronically ill-appearing Eyes: Conjunctivae are normal.  Head: Atraumatic. Nose: No congestion/rhinnorhea. Mouth/Throat: Mucous membranes are dry Neck:  Painless ROM Cardiovascular: Normal rate, regular rhythm. Grossly normal heart sounds.  Good peripheral circulation. Respiratory: Normal respiratory effort.  No retractions. Lungs CTAB. Gastrointestinal: Soft and nontender. No distention.    Musculoskeletal:  Warm and well perfused,  likely chronic hypoperfusion in the toes of the left foot as they are dusky Neurologic:  Normal speech and language. No gross focal neurologic deficits are appreciated.  Skin:  Skin is warm, dry and intact.  Psychiatric: Mood and affect are normal. Speech and behavior are normal.  ____________________________________________   LABS (all labs ordered are listed, but only abnormal results are displayed)  Labs Reviewed  CBC - Abnormal; Notable for the following components:      Result Value   WBC 19.8 (*)    Hemoglobin 11.7 (*)    HCT 36.9 (*)    MCV 74.6 (*)    MCH 23.6 (*)    MCHC 31.7 (*)    RDW 24.7 (*)    All other components within normal limits  COMPREHENSIVE METABOLIC PANEL - Abnormal; Notable for the following components:   Potassium 3.3 (*)    Glucose, Bld 108 (*)    BUN 31 (*)    Creatinine, Ser 3.64 (*)    Calcium 8.0 (*)    Total Protein 5.1 (*)    Albumin 2.2 (*)    AST 300 (*)    ALT 201 (*)    Alkaline Phosphatase 150 (*)    Total Bilirubin 3.5 (*)    GFR calc non Af Amer 14 (*)    GFR calc Af Amer 17 (*)    All other components within normal limits  TROPONIN I - Abnormal; Notable for the following components:   Troponin I 0.63 (*)    All other components within normal limits  CULTURE, BLOOD (ROUTINE X 2)  CULTURE, BLOOD (ROUTINE X 2)  MRSA PCR SCREENING  MAGNESIUM  PHOSPHORUS  BRAIN NATRIURETIC PEPTIDE  LACTIC ACID, PLASMA  LACTIC ACID, PLASMA  PROCALCITONIN   ____________________________________________  EKG  ED ECG REPORT I, Lavonia Drafts, the attending physician, personally viewed and interpreted this ECG.  Date: 12/14/2017  Rhythm: normal sinus rhythm QRS Axis: normal Intervals: normal ST/T Wave abnormalities: Non specific changes Narrative Interpretation: no evidence of acute ischemia  ____________________________________________  RADIOLOGY  Chest x-ray concerning for worsening right lower lobe  infiltrate ____________________________________________   PROCEDURES  Procedure(s) performed: No  Procedures   Critical Care performed: No ____________________________________________   INITIAL IMPRESSION / ASSESSMENT AND PLAN / ED COURSE  Pertinent labs & imaging results that were available during my care of the patient were reviewed by me and considered in my medical decision making (see chart for details).  Patient with  history of end-stage renal disease presents after apparent syncopal episode during dialysis.  Differential diagnosis includes but is not limited to decreased intravascular volume/dehydration, arrhythmia, ACS, infection  Patient recently discharged with an active pneumonia, this appears to be unresolved  We will initiate broad work-up and monitor carefully  Chest x-ray consistent with worsening pneumonia.  Elevated white blood cell count.  Elevated troponin likely related to strain but we will need to monitor this.  Treated with broad-spectrum antibiotics, will admit to the hospital service    ____________________________________________   FINAL CLINICAL IMPRESSION(S) / ED DIAGNOSES  Final diagnoses:  HCAP (healthcare-associated pneumonia)  Elevated troponin        Note:  This document was prepared using Dragon voice recognition software and may include unintentional dictation errors.    Lavonia Drafts, MD 12/25/2017 1059

## 2018-01-10 NOTE — Progress Notes (Signed)
Pt admitted from ED via hospital bed without incident per order. Pt accompanied by daughter, [name]. Pt has IV Vancomycin running on admit to floor. Pt is alert and oriented x 3 but does speak with garbled speech. Tele applied per order. Pt admitted to room 224. Pt and daughter instructed unit routines. Pt and daughter verbalize understanding and agree to comply.

## 2018-01-10 NOTE — ED Notes (Signed)
2 failed attempts by this RN and 1 by RN Radonna Ricker for IV access. Agricultural consultant called

## 2018-01-10 NOTE — Consult Note (Signed)
[facility] Nurse wound consult note Reason for Consult: Multiple wound sites Wound type: Sacrum/coccyx area has a total of 5 open areas; the largest of which is at the coccyx.  It measures  1 cm x 1.2 cm x unknown depth.  It has a yellow wound bed.  The other four areas are also over bony prominences, four are linear shaped, one is oval shaped; all are fairly superficial measuring approximate 0.2 cm depth to the oval shaped area, and all are fairly small (approximately 0.4 cm x 0.3 cm).  Surrounding tissue for all of these is darkened.  Surrounding tissue is non-indurated, non-erythematous and without drainage or odor.  The patient flinches when I touch the areas and therefore they appear painful.  The coccyx wound bed is the only one that is yellow; all others are pink.  It would not surprise me for these to merge into one or more larger wounds.  At the time of my assessment in room 224 A, there was a sacral foam dressing in place.  This was removed, No-Rinse cleanser was used to cleanse the area for wound assessment.  Criticaid clear was placed in all the wound beds and a foam dressing then placed to the area while we await the arrival of Santyl.  The patient's primary RN was present throughout my exam.  The tips of the left great toe and left 5th toe were slightly darkened.  There were two, barely discernible, abraded areas on the dorsum of the foot just behind the toes, that could easily be related to rubbing from a shoe.  The left foot has +3 pitting edema.  The patient states the left great toe is painful.  I could not palpate a dorsalis pedis pulse to the foot.  The skin darkening is suggestive of arterial insufficiency.  Currently, there are no wounds to treat.  I have entered orders to float the heels and to provide an air mattress, and every 2 hour turning regimen to limit the time in a supine position.  Pressure Injury POA: Yes Monitor the wound area(s) for worsening of condition such as: Signs/symptoms  of infection,  Increase in size,  Development of or worsening of odor, Development of pain, or increased pain at the affected locations.  Notify the medical team if any of these develop.  Thank you for the consult.  Discussed plan of care with the patient and bedside nurse.  [place] nurse will not follow at this time.  Please re-consult the [place] team if needed.  Val Riles, RN, MSN, CWOCN, CNS-BC, pager [phone]

## 2018-01-10 NOTE — Progress Notes (Signed)
Hd started  

## 2018-01-10 NOTE — Progress Notes (Signed)
Patient ID: Andrew [name], male   DOB: July 16, 1933, 84 y.o.   MRN: [id number] ACP note  Patient and daughter at the bedside  Diagnosis: Slurred speech and altered mental status at dialysis, healthcare associated pneumonia, chronic respiratory failure, history of congestive heart failure, end-stage renal disease, history of gout, and anemia of chronic disease, paroxysmal atrial fibrillation  CODE STATUS discussed.  Overall prognosis poor with repeated hospitalizations.  Patient would like to be a full code.  Palliative care consultation.  Plan.  Observation status MRI of the brain to rule out stroke.  Time spent on ACP discussion 17 minutes Dr. Loletha Grayer

## 2018-01-10 NOTE — Evaluation (Addendum)
Clinical/Bedside Swallow Evaluation Patient Details  Name: Andrew [name] MRN: [id number] Date of Birth: 09/11/1932  Today's Date: 12/13/2017 Time: SLP Start Time (ACUTE ONLY): 62 SLP Stop Time (ACUTE ONLY): 1025 SLP Time Calculation (min) (ACUTE ONLY): 60 min  Past Medical History:  Past Medical History:  Diagnosis Date  . Arrhythmia    atrial fibrillation  . Arteriosclerosis of coronary artery 01/27/2014  . Benign essential HTN 12/11/2013  . Carpal tunnel syndrome   . Chronic kidney disease   . Coronary atherosclerosis of native coronary artery   . Disorder of mitral valve 12/11/2013  . Encounter for long-term (current) use of antiplatelets/antithrombotics   . Gout   . Hyperlipidemia   . Hypertension   . Lung nodule    found on xray in Aug 2018  . Mitral valve disorder   . Osteoarthritis   . Prostate cancer [facility])    Past Surgical History:  Past Surgical History:  Procedure Laterality Date  . COLONOSCOPY    . COLONOSCOPY WITH PROPOFOL N/A 07/23/2016   Procedure: COLONOSCOPY WITH PROPOFOL;  Surgeon: Manya Silvas, MD;  Location: [facility] ENDOSCOPY;  Service: Endoscopy;  Laterality: N/A;  . CORONARY ANGIOPLASTY WITH STENT PLACEMENT    . DIALYSIS/PERMA CATHETER INSERTION N/A 12/09/2017   Procedure: DIALYSIS/PERMA CATHETER INSERTION;  Surgeon: Algernon Huxley, MD;  Location: [place] CV LAB;  Service: Cardiovascular;  Laterality: N/A;  . DIALYSIS/PERMA CATHETER INSERTION N/A 12/16/2017   Procedure: DIALYSIS/PERMA CATHETER INSERTION;  Surgeon: Algernon Huxley, MD;  Location: [place] CV LAB;  Service: Cardiovascular;  Laterality: N/A;  . ESOPHAGOGASTRODUODENOSCOPY Left 07/28/2017   Procedure: ESOPHAGOGASTRODUODENOSCOPY (EGD);  Surgeon: Virgel Manifold, MD;  Location: [facility] ENDOSCOPY;  Service: Endoscopy;  Laterality: Left;  . ESOPHAGOGASTRODUODENOSCOPY (EGD) WITH PROPOFOL N/A 07/23/2016   Procedure: ESOPHAGOGASTRODUODENOSCOPY (EGD) WITH PROPOFOL;  Surgeon: Manya Silvas, MD;   Location: [facility] ENDOSCOPY;  Service: Endoscopy;  Laterality: N/A;  . PROSTATE BIOPSY     HPI:    Pt is a 84 y.o. male w/ multiple medical issues was over at dialysis and ended up getting dizzy.  The patient states that his speech has not been clear for 4 days.  He has had some intermittent nausea and vomiting and some belching.  At dialysis today, he was choking(when drinking Nepro "too fast") and then had decreased responsiveness.  He was given oxygen and and came back.  Patient feels weak.  In the ER, the ER physician was concerned about pneumonia and started aggressive antibiotics.  The patient was recently discharged from the hospital.     Assessment / Plan / Recommendation Clinical Impression  Pt appears to present w/ adequate oropharyngeal phase swallow function but is hampered by mouth/oral discomfort from ulcer/lesion and recent Thrush impacting the oral phase of swallowing w/ regard to bolus management. He appears at reduced risk for aspiration w/ a modified diet and following aspiration precautions. Pt does exhibit some impulsive drinking behavior requiring cues to slow down/pause as well as the oral phase discomfort. Suspect pt's illness is an overall impacting factor. He required verbal/tactile cues for follow through w/ oral intake tasks, positioning upright in bed for po trials. Pt appeared to adequately tolerate trials of Puree and Thin liquids VIA CUP (helping to hold for drinking) w/ no immediate, overt s/s of aspiration noted; no decline in vocal quality or significant change in respiratory presentation. Pt's oral phase was c/b min prolonged A-P transit w/ min extra time required for complete bolus transfer, swallow and oral  clearing. No other trial consistencies (solids) were assessed d/t this presentation and pt's mouth/oral soreness. Recommend a dysphagia level 1 (PUREE) w/ Thin liquids by CUP; strict aspiration precautions. Full feeding support when fully alert/awake for safe oral  intake. ST services will continue to f/u w/ pt's status and trials to upgrade diet when pt's status improves for safety and comfort w/ trials. Dtr given education on the above and education on supporting during feeding. Palliative Care and NSG are addressing order for mouth rinse for Thrush; having MD f/u w/ assessment of anterior, R lateral lingual lesion. SLP Visit Diagnosis: Dysphagia, oral phase (R13.11)    Aspiration Risk  Mild aspiration risk    Diet Recommendation  Dysphagia level 1 (PUREE) w/ Thin liquids; moistened foods w/ condiments. General aspiration precautions; feeding support at meals.  Medication Administration: Crushed with puree(as able)    Other  Recommendations Recommended Consults: (Dietician f/u; Palliative following) Oral Care Recommendations: Oral care BID;Staff/trained caregiver to provide oral care Other Recommendations: (n/a)   Follow up Recommendations None      Frequency and Duration min 2x/week  1 week       Prognosis Prognosis for Safe Diet Advancement: Fair(-Good) Barriers to Reach Goals: Severity of deficits(mouth/oral soreness) Barriers/Prognosis Comment: fatigue      Swallow Study   General Date of Onset: 12/26/2017 Type of Study: Bedside Swallow Evaluation Previous Swallow Assessment: none Diet Prior to this Study: NPO(regular diet at home) Temperature Spikes Noted: No(wbc 19.8) Respiratory Status: Room air History of Recent Intubation: No Behavior/Cognition: Alert;Cooperative;Pleasant mood;Lethargic/Drowsy;Requires cueing Oral Cavity Assessment: Dry;Lesions;Dried secretions(recent Thrush; pt c/o soreness) Oral Care Completed by SLP: Yes Oral Cavity - Dentition: Poor condition;Missing dentition Vision: Functional for self-feeding(grossly) Self-Feeding Abilities: Able to feed self;Needs assist;Needs set up;Total assist(helped to hold cup to drink) Patient Positioning: Upright in bed Baseline Vocal Quality: Low vocal intensity(slightly  mumbled speech - poor oral ) Volitional Cough: Weak(-Fair) Volitional Swallow: Able to elicit(uncomfortable)    Oral/Motor/Sensory Function Overall Oral Motor/Sensory Function: Within functional limits(grossly)   Ice Chips Ice chips: Within functional limits Presentation: Spoon(fed; 3 trials)   Thin Liquid Thin Liquid: Within functional limits Presentation: Cup;Self Fed(~3 ozs) Other Comments: slightly impulsive - needed cues to take break/pause    Nectar Thick Nectar Thick Liquid: Not tested   Honey Thick Honey Thick Liquid: Not tested   Puree Puree: Within functional limits Presentation: Spoon(fed; 10 trials) Other Comments: min careful, slower movements d/t oral/mouth soreness   Solid   GO   Solid: Not tested Other Comments: d/t mouth soreness         Orinda Kenner, MS, CCC-SLP Clara Smolen 12/31/2017,5:08 PM

## 2018-01-10 NOTE — Progress Notes (Signed)
Pharmacy Antibiotic Note  Andrew [name] is a 84 y.o. male admitted on 12/25/2017 with pneumonia.  Pharmacy has been consulted for cefepime dosing. Patient ordered vancomycin in ED subsequently discontinued secondary to negative MRSA PCR. Patient with recent admission discharged 5/19 on levofloxacin and steroids (for gout).   Plan: Cefepime 1g IV Q24hr.    Height: 5\' 9"  (175.3 cm) Weight: 150 lb (68 kg) IBW/kg (Calculated) : 70.7  Temp (24hrs), Avg:98.1 F (36.7 C), Min:98 F (36.7 C), Max:98.2 F (36.8 C)  Recent Labs  Lab 12/17/2017 0838 12/22/2017 0935 12/27/2017 1006 12/18/2017 1245  WBC 19.8*  --   --   --   CREATININE  --  3.64*  --   --   LATICACIDVEN  --   --  4.3* 4.6*    Estimated Creatinine Clearance: 15 mL/min (A) (by C-G formula based on SCr of 3.64 mg/dL (H)).    No Known Allergies  Antimicrobials this admission: Vancomycin 5/31  Cefepime 5/31 >>   Dose adjustments this admission: N/A  Microbiology results: 5/31 BCx: sent  5/31 MRSA PCR: negative   Thank you for allowing pharmacy to be a part of this patient's care.  Daryel Kenneth L 12/27/2017 4:56 PM

## 2018-01-10 NOTE — Care Management Obs Status (Signed)
[place] NOTIFICATION   Patient Details  Name: Andrew [name] MRN: [id number] Date of Birth: Nov 23, 1932   Medicare Observation Status Notification Given:  Yes    Marshell Garfinkel, RN 12/21/2017, 10:57 AM

## 2018-01-10 NOTE — Care Management Note (Signed)
Case Management Note  Patient Details  Name: Andrew [name] MRN: [id number] Date of Birth: September 05, 1932  Subjective/Objective:                   RNCM met with patient (kept eyes closed and did not respond to questions asked) and his daughter Andrew [name] that answered assessment questions.  Patient states that "he is weaker now since being in the hospital last visit and has not being able to move around like before".  She hopes that he will get back on his feet and they can "do something fun together".  He is receiving [facility] at home through [facility].  He uses a walker for ambulation. Andrew [name] states that she feels "he is doing well with outpatient hemodialysis" however, it appears he has not been able to finish treatments.  Daughter states now he is having difficulty swallowing and MRI is pending. She hopes to take him back home followed by [facility]. He goes by private vehicle to [employer].  [facility] letter delivered/explained. They are familiar with [facility].  Action/Plan: RNCM to follow.   Expected Discharge Date:                  Expected Discharge Plan:     In-House Referral:     Discharge planning Services     Post Acute Care Choice:  Resumption of Svcs/PTA Provider, Home Health Choice offered to:  Patient, Adult Children  DME Arranged:    DME Agency:     HH Arranged:  RN, PT, OT, Social Work, Nurse's Aide [place] Agency:  [place]  Status of Service:  In process, will continue to follow  If discussed at Long Length of Stay Meetings, dates discussed:    Additional Comments:  Andrew Garfinkel, RN 12/16/2017, 11:43 AM

## 2018-01-10 NOTE — Progress Notes (Signed)
Consult received, pt away at MRI.  Will schedule dialysis using 4k bath.  Will also administer albumin with HD to see if this can assist with fluid removal.

## 2018-01-10 NOTE — Consult Note (Addendum)
Consultation Note Date: 12/20/2017   Patient Name: Andrew [name]  DOB: May 17, 1933  MRN: [id number]  Age / Sex: 84 y.o., male  PCP: [name], MD Referring Physician: Loletha Grayer, MD  Reason for Consultation: Establishing goals of care  HPI/Patient Profile: 84 y.o. 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 Admitted to r/o stroke, treating for HCAP, and continuing dialysis.   Clinical Assessment and Goals of Care: I met today at [name]'s bedside along with his daughter, Andrew [name]. [name] is fatigued and appeared slightly confused at times in conversation. He did respond when addressed but voice is weakened. SLP also at bedside.   Reviewed course of decline over the past few months with Andrew [name] and his severe deconditioning. Andrew [name] says that she is relieved that he is hospitalized because she feels his issues will be addressed and "he can be on the road to recovery." During conversation it is clear that they are still very hopeful for improvement and desire aggressive care. They are very appreciative of the care they are receiving at the hospital.   I discussed further the severity of poor nutrition and severe weakness and the cycle of continued complications with skin breakdown, infection, dehydration, etc. I explained that this cycle is very difficult to break and we are very concerned about him. Also explained that each complication and hospitalization/illness puts him farther away from improvement and makes him even weaker. Andrew [name] understands but believes that he can break this cycle.  Emotional support provided.   **Large ulceration discovered during SLP assessment on right lateral posterior tongue**  Primary Decision Maker NEXT OF KIN Patient slightly confused and too fatigued to truly participate in discussion. Has wife and 3 daughters but daughter, Andrew [name], is caregiver and most present and active in his care.     SUMMARY OF RECOMMENDATIONS   - Continue full aggressive care - F/U Monday if still hospitalized  Code Status/[facility] Care Planning:  Full code   Symptom Management:   Thrush/tongue ulceration: Magic mouthwash QID. Consider addition of oral suspension of lidocaine for pain management to assist with more comfortable intake. May also consider carafate.   Poor intake: Nepro TID. Magic cup. Encouraged small frequent meals.   Palliative Prophylaxis:   Aspiration, Bowel Regimen, Delirium Protocol, Frequent Pain Assessment, Oral Care, Palliative Wound Care and Turn Reposition  Additional Recommendations (Limitations, Scope, Preferences):  Full Scope Treatment  Psycho-social/Spiritual:   Desire for further Chaplaincy support:yes  Additional Recommendations: Caregiving  Support/Resources  Prognosis:   Prognosis very poor with severe deconditioning and co-morbidities.   Discharge Planning: To Be Determined      Primary Diagnoses: Present on Admission: . Pneumonia   I have reviewed the medical record, interviewed the patient and family, and examined the patient.  The following aspects are pertinent.  Past Medical History:  Diagnosis Date  . Arrhythmia    atrial fibrillation  . Arteriosclerosis of coronary artery 01/27/2014  . Benign essential HTN 12/11/2013  . Carpal tunnel syndrome   . Chronic kidney disease   . Coronary atherosclerosis of native coronary artery   . Disorder of mitral valve 12/11/2013  . Encounter for long-term (current) use of antiplatelets/antithrombotics   . Gout   . Hyperlipidemia   . Hypertension   . Lung nodule      found on xray in Aug 2018  . Mitral valve disorder   . Osteoarthritis   . Prostate cancer [facility])    Social History   Socioeconomic History  . Marital status: Married    Spouse name: Not on file  . Number of children: Not on file  . Years of education: Not on file  . Highest education level: Not on file  Occupational History  . Not on file  Social Needs  . Financial resource strain: Not on file  . Food insecurity:    Worry: Not on file    Inability: Not on file  . Transportation needs:    Medical: Not on file    Non-medical: Not on file  Tobacco Use  . Smoking status: Never Smoker  . Smokeless tobacco: Never Used  Substance and Sexual Activity  . Alcohol use: No  . Drug use: No  . Sexual activity: Not Currently  Lifestyle  . Physical activity:    Days per week: Not on file    Minutes per session: Not on file  . Stress: Not on file  Relationships  . Social connections:    Talks on phone: Not on file    Gets together: Not on file    Attends religious service: Not on file    Active member of club or organization: Not on file    Attends meetings of clubs or organizations: Not on file    Relationship status: Not on file  Other Topics Concern  . Not on file  Social History Narrative  . Not on file   Family History  Problem Relation Age of Onset  . Cancer Mother        stomach and uterus  . Cancer Sister        breast   Scheduled Meds: . amiodarone  200 mg Oral Daily  . aspirin EC  81 mg Oral Daily  . atorvastatin  40 mg Oral q1800  . collagenase   Topical Daily  . feeding supplement (NEPRO CARB STEADY)  237 mL Oral TID BM  . finasteride  5 mg Oral Daily  . fluconazole  100 mg Oral Daily  . fluticasone  2 spray Each Nare Daily  . megestrol  200 mg Oral QHS  . midodrine  10 mg Oral STAT  . midodrine  10 mg Oral TID WC  . multivitamin  1 tablet Oral QHS  . multivitamin-lutein  1 capsule Oral Daily  . pantoprazole  40 mg Oral Daily   Continuous  Infusions: . [START ON 01/13/2018] albumin human     PRN Meds:.benzonatate, HYDROcodone-acetaminophen, senna-docusate, sodium chloride No Known Allergies Review of Systems  Unable to perform ROS: Acuity of condition    Physical Exam  Constitutional: He appears lethargic. He has a sickly appearance.  Thin, frail  HENT:  +thrush but appears to be improving Gray ulceration right lateral posterior tongue  Cardiovascular: Normal rate and regular rhythm.  Pulmonary/Chest: Effort normal. No  accessory muscle usage. No tachypnea. No respiratory distress.  Abdominal: Normal appearance.  Neurological: He appears lethargic.  Seems somewhat confused  Nursing note and vitals reviewed.   Vital Signs: BP 100/68 (BP Location: Left Arm)   Pulse 82   Temp 98.2 F (36.8 C) (Oral)   Resp 18   Ht 5' 9"  (1.753 m)   Wt 68 kg (150 lb)   SpO2 (!) 86%   BMI 22.15 kg/m  Pain Scale: 0-10   Pain Score: 0-No pain   SpO2: SpO2: (!) 86 % O2 Device:SpO2: (!) 86 % O2 Flow Rate: .   IO: Intake/output summary: No intake or output data in the 24 hours ending 01/07/2018 1519  LBM:   Baseline Weight: Weight: 68 kg (150 lb) Most recent weight: Weight: 68 kg (150 lb)     Palliative Assessment/Data: 20%     Time Total: 60 min  Greater than 50%  of this time was spent counseling and coordinating care related to the above assessment and plan.  Signed by: Vinie Sill, NP Palliative Medicine Team Pager # [phone] (M-F 8a-5p) Team Phone # [phone] (Nights/Weekends)

## 2018-01-11 LAB — PROCALCITONIN: PROCALCITONIN: 3.24 ng/mL

## 2018-01-11 MED ORDER — LIDOCAINE VISCOUS HCL 2 % MT SOLN
15.0000 mL | Freq: Four times a day (QID) | OROMUCOSAL | Status: DC
  Administered 2018-01-11 – 2018-01-17 (×8): 15 mL via OROMUCOSAL
  Filled 2018-01-11 (×29): qty 15

## 2018-01-11 MED ORDER — MIRTAZAPINE 15 MG PO TABS
7.5000 mg | ORAL_TABLET | Freq: Every day | ORAL | Status: DC
  Administered 2018-01-11 – 2018-01-12 (×2): 7.5 mg via ORAL
  Filled 2018-01-11 (×2): qty 1

## 2018-01-11 NOTE — Evaluation (Signed)
Physical Therapy Evaluation Patient Details Name: Andrew [name] MRN: [id number] DOB: 01-17-1933 Today's Date: 01/11/2018   History of Present Illness  Pt is an 84y/o male admitted with the following dx: Slurred speech and altered mental status at dialysis, healthcare associated pneumonia. PMH is significant for the following: chronic respiratory failure, history of congestive heart failure, end-stage renal disease, history of gout, and anemia of chronic disease, paroxysmal atrial fibrillation.  Clinical Impression  Pt with recent frequent hospitalizations and now admitted for PNA and altered mental status. Pt's dtr present for first 5 minutes of session and stated pt's cognitive issues are new. Pt was receiving HHPT prior to admission and dtr assisted pt with ADLs. Pt required assist for STS txfs with PT and has not amb. Recently, despite pt stating he amb. Yesterday. Pt required +2 assist to roll in order to change diaper and address sacral wound (RN and CNA performed dressing change while PT ensure pt remained steady). Unable to attempt txfs or amb. 2/2 pt refused and unsafe based on cognition at this time. Pt was agreeable to exercises. Pt unable to perform [name] shoulder flexion, pt's dtr was not present at this time, therefore, need to determine if this is a new or chronic issue. PT recommending SNF at this time, would benefit from skilled PT to address above deficits and promote optimal return to PLOF.     Follow Up Recommendations SNF    Equipment Recommendations  None recommended by PT    Recommendations for Other Services OT consult     Precautions / Restrictions Precautions Precautions: Fall;Other (comment)(HD) Restrictions Weight Bearing Restrictions: No      Mobility  Bed Mobility Overal bed mobility: Needs Assistance Bed Mobility: Rolling Rolling: +2 for physical assistance;Max assist         General bed mobility comments: PT and CNA rolled pt for R to [name] in order to change  diaper and nurse (and CNA) changed sacrum wound dressing. Pt able to initiate reaching with contralateral UE to initiate rolling in each direction. Then required max A to roll trunk and BLEs. Once in sidelying pt able to maintain with min A.  Transfers Overall transfer level: (not attempted)               General transfer comment: Pt's dtr stated pt was working on STS txfs with HHPT. Pt refused attempting to sit supine, let alone STS txf.   Ambulation/Gait Ambulation/Gait assistance: (not attempted)              Science writer    Modified Rankin (Stroke Patients Only)       Balance Overall balance assessment: (not tested as pt refused to attempt sitting EOB.)                                           Pertinent Vitals/Pain Pain Assessment: No/denies pain    Home Living Family/patient expects to be discharged to:: Private residence Living Arrangements: Children(dtr: Andrew [name]) Available Help at Discharge: Family;Available 24 hours/day Type of Home: House Home Access: Stairs to enter Entrance Stairs-Rails: (pt has lift chair to enter) Entrance Stairs-Number of Steps: 14(pt has lift chair to access bedroom on second floor) Home Layout: Multi-level Home Equipment: Walker - 4 wheels;Cane - single point      Prior Function Level of Independence:  Needs assistance   Gait / Transfers Assistance Needed: Per pt's dtr (2/2 pt's impaired cognition), pt has been receiving HHPT and they have been working on STS txfs with RW and exercises. Pt has not been able to amb., despite pt stating he walked yesterday.           Hand Dominance   Dominant Hand: Right    Extremity/Trunk Assessment   Upper Extremity Assessment Upper Extremity Assessment: Generalized weakness(pt unable to perform [name] shoulder flexion)    Lower Extremity Assessment Lower Extremity Assessment: Generalized weakness       Communication   Communication:  HOH  Cognition Arousal/Alertness: Awake/alert Behavior During Therapy: (pt with intermittent inability to attend to task) Overall Cognitive Status: Impaired/Different from baseline Area of Impairment: Following commands;Memory;Attention                   Current Attention Level: Alternating Memory: Decreased short-term memory Following Commands: Follows one step commands inconsistently              General Comments General comments (skin integrity, edema, etc.): Sacrum wound per RN    Exercises General Exercises - Upper Extremity Shoulder Flexion: Strengthening;Both;10 reps(only able to perform [name] elbow flex, no [name] shldr flex) General Exercises - Lower Extremity Ankle Circles/Pumps: AROM;Strengthening;Both;10 reps;Supine Quad Sets: AROM;Strengthening;Both;10 reps;Supine Gluteal Sets: AROM;Strengthening;Both;10 reps;Supine Short Arc Quad: AROM;Strengthening;Both;10 reps;Supine Heel Slides: AROM;Strengthening;Both;10 reps;Supine Hip ABduction/ADduction: AROM;Strengthening;Both;10 reps;Supine Other Exercises Other Exercises: Cues and demo for technique. PT placed hand under pt's heels to reduce friction.    Assessment/Plan    PT Assessment Patient needs continued PT services  PT Problem List Decreased strength;Decreased range of motion;Decreased activity tolerance;Decreased balance;Decreased mobility;Decreased coordination;Decreased knowledge of use of DME;Decreased safety awareness;Cardiopulmonary status limiting activity;Decreased cognition       PT Treatment Interventions DME instruction;Gait training;Stair training;Functional mobility training;Therapeutic activities;Therapeutic exercise;Balance training;Neuromuscular re-education;Patient/family education;Cognitive remediation    PT Goals (Current goals can be found in the Care Plan section)  Acute Rehab PT Goals Patient Stated Goal: To get stronger.  PT Goal Formulation: With patient Time For Goal Achievement:  01/25/18 Potential to Achieve Goals: Poor(based on frequent hospitalizations and hx)    Frequency Min 2X/week   Barriers to discharge        Co-evaluation               AM-PAC PT "6 Clicks" Daily Activity  Outcome Measure Difficulty turning over in bed (including adjusting bedclothes, sheets and blankets)?: A Lot Difficulty moving from lying on back to sitting on the side of the bed? : Unable Difficulty sitting down on and standing up from a chair with arms (e.g., wheelchair, bedside commode, etc,.)?: Unable Help needed moving to and from a bed to chair (including a wheelchair)?: Total Help needed walking in hospital room?: Total Help needed climbing 3-5 steps with a railing? : Total 6 Click Score: 7    End of Session   Activity Tolerance: Patient limited by fatigue(and cognition) Patient left: in bed;with bed alarm set;with call bell/phone within reach Nurse Communication: Mobility status(spoke with Estill Bamberg (CNA) re: mobility and exercises) PT Visit Diagnosis: Unsteadiness on feet (R26.81);Muscle weakness (generalized) (M62.81);Adult, failure to thrive (R62.7);Other abnormalities of gait and mobility (R26.89)    Time: 1050-1109 PT Time Calculation (min) (ACUTE ONLY): 19 min   Charges:   PT Evaluation $PT Eval Low Complexity: 1 Low PT Treatments $Therapeutic Exercise: 8-22 mins   PT G Codes:        Geoffry Paradise, PT,DPT 01/11/18 11:37  AM    Andrew [name] 01/11/2018, 11:33 AM

## 2018-01-11 NOTE — Progress Notes (Signed)
[place] at [facility]                                                                                                                                                                                  Patient Demographics   Andrew [name], is a 84 y.o. male, DOB - February 06, 1933, UJW:[id number]  Admit date - 12/24/2017   Admitting Physician Loletha Grayer, MD  Outpatient Primary MD for the patient is [name], MD   LOS - 0  Subjective: More awake , pt continues to have thrush  Daughter at bed side    Review of Systems:   CONSTITUTIONAL: No documented fever. No fatigue, weakness. No weight gain, no weight loss.  EYES: No blurry or double vision.  ENT: No tinnitus. No postnasal drip. No redness of the oropharynx.  RESPIRATORY: + cough, no wheeze, no hemoptysis. + dyspnea.  CARDIOVASCULAR: No chest pain. No orthopnea. No palpitations. No syncope.  GASTROINTESTINAL: No nausea, no vomiting or diarrhea. No abdominal pain. No melena or hematochezia.  GENITOURINARY: No dysuria or hematuria.  ENDOCRINE: No polyuria or nocturia. No heat or cold intolerance.  HEMATOLOGY: No anemia. No bruising. No bleeding.  INTEGUMENTARY: No rashes. No lesions.  MUSCULOSKELETAL: No arthritis. No swelling. No gout.  NEUROLOGIC: No numbness, tingling, or ataxia. No seizure-type activity.  PSYCHIATRIC: No anxiety. No insomnia. No ADD.    Vitals:   Vitals:   01/08/2018 2115 12/15/2017 2130 12/28/2017 2241 01/11/18 0517  BP: (!) 89/56 100/80 (!) 89/57 98/61  Pulse: 91 99 93 84  Resp: 15 16 16 18   Temp:  98.5 F (36.9 C) 98.3 F (36.8 C) 97.9 F (36.6 C)  TempSrc:    Oral  SpO2:   100% 91%  Weight:      Height:        Wt Readings from Last 3 Encounters:  12/13/2017 68 kg (150 lb)  12/26/17 73.5 kg (162 lb)  12/21/17 72.4 kg (159 lb 11.2 oz)     Intake/Output Summary (Last 24 hours) at 01/11/2018 1523 Last data filed at 01/11/2018 1456 Gross per 24 hour  Intake 170 ml   Output 0 ml  Net 170 ml    Physical Exam:   GENERAL: Pleasant-appearing in no apparent distress.  HEAD, EYES, EARS, NOSE AND THROAT: Atraumatic, normocephalic. Extraocular muscles are intact. Pupils equal and reactive to light. Sclerae anicteric. No conjunctival injection. No oro-pharyngeal erythema.  NECK: Supple. There is no jugular venous distention. No bruits, no lymphadenopathy, no thyromegaly.  HEART: Regular rate and rhythm,. No murmurs, no rubs, no clicks.  LUNGS: Clear to auscultation bilaterally. No rales or rhonchi. No wheezes.  ABDOMEN: Soft, flat, nontender,  nondistended. Has good bowel sounds. No hepatosplenomegaly appreciated.  EXTREMITIES: No evidence of any cyanosis, clubbing, or peripheral edema.  +2 pedal and radial pulses bilaterally.  NEUROLOGIC: The patient is alert, awake, and oriented x3 with no focal motor or sensory deficits appreciated bilaterally.  SKIN: Moist and warm with no rashes appreciated.  Psych: Not anxious, depressed LN: No inguinal LN enlargement    Antibiotics   Anti-infectives (From admission, onward)   Start     Dose/Rate Route Frequency Ordered Stop   01/11/18 1000  ceFEPIme (MAXIPIME) 1 g in sodium chloride 0.9 % 100 mL IVPB     1 g 200 mL/hr over 30 Minutes Intravenous Every 24 hours 01/09/2018 1535     01/03/2018 1700  vancomycin (VANCOCIN) 500 mg in sodium chloride 0.9 % 100 mL IVPB     500 mg 100 mL/hr over 60 Minutes Intravenous  Once 01/05/2018 1536 12/21/2017 1708   12/21/2017 1115  fluconazole (DIFLUCAN) tablet 100 mg     100 mg Oral Daily 12/18/2017 1112     12/17/2017 1015  ceFEPIme (MAXIPIME) 2 g in sodium chloride 0.9 % 100 mL IVPB     2 g 200 mL/hr over 30 Minutes Intravenous  Once 12/30/2017 1005 12/22/2017 1127   12/24/2017 1015  vancomycin (VANCOCIN) IVPB 1000 mg/200 mL premix     1,000 mg 200 mL/hr over 60 Minutes Intravenous  Once 12/16/2017 1005 01/09/2018 1227      Medications   Scheduled Meds: . amiodarone  200 mg Oral Daily  .  aspirin EC  81 mg Oral Daily  . atorvastatin  40 mg Oral q1800  . collagenase   Topical Daily  . feeding supplement (NEPRO CARB STEADY)  237 mL Oral TID BM  . finasteride  5 mg Oral Daily  . fluconazole  100 mg Oral Daily  . fluticasone  2 spray Each Nare Daily  . magic mouthwash  15 mL Oral QID  . megestrol  200 mg Oral QHS  . midodrine  10 mg Oral TID WC  . mirtazapine  7.5 mg Oral QHS  . multivitamin  1 tablet Oral QHS  . pantoprazole  40 mg Oral Daily   Continuous Infusions: . [START ON 01/13/2018] albumin human    . ceFEPime (MAXIPIME) IV Stopped (01/11/18 1220)   PRN Meds:.benzonatate, HYDROcodone-acetaminophen, senna-docusate, sodium chloride   Data Review:   Micro Results Recent Results (from the past 240 hour(s))  Blood Culture (routine x 2)     Status: None (Preliminary result)   Collection Time: 01/07/2018 10:06 AM  Result Value Ref Range Status   Specimen Description BLOOD LEFT ARM  Final   Special Requests   Final    BOTTLES DRAWN AEROBIC AND ANAEROBIC Blood Culture results may not be optimal due to an inadequate volume of blood received in culture bottles   Culture   Final    NO GROWTH < 24 HOURS Performed at [facility], [address], [place], [place] [address]    Report Status PENDING  Incomplete  Blood Culture (routine x 2)     Status: None (Preliminary result)   Collection Time: 12/30/2017 10:11 AM  Result Value Ref Range Status   Specimen Description BLOOD LEFT ARM  Final   Special Requests   Final    BOTTLES DRAWN AEROBIC AND ANAEROBIC Blood Culture results may not be optimal due to an inadequate volume of blood received in culture bottles   Culture   Final    NO GROWTH <  41 HOURS Performed at [facility], [address] [place], [place] [address]    Report Status PENDING  Incomplete  MRSA PCR Screening     Status: None   Collection Time: 01/07/2018  2:30 PM  Result Value Ref Range Status   MRSA by PCR NEGATIVE NEGATIVE Final     Comment:        The GeneXpert MRSA Assay (FDA approved for NASAL specimens only), is one component of a comprehensive MRSA colonization surveillance program. It is not intended to diagnose MRSA infection nor to guide or monitor treatment for MRSA infections. Performed at [facility], [address], [place], [place] [address]     Radiology Reports Dg Chest 2 View  Result Date: 12/26/2017 CLINICAL DATA:  Shortness of breath and weakness for several hours EXAM: CHEST - 2 VIEW COMPARISON:  12/20/2017 FINDINGS: Dialysis catheter is again identified. Stable cardiomegaly is seen. Small pleural effusions and bilateral lower lobe infiltrate/atelectasis is noted slightly greater on the right than the left. The overall appearance is similar to that seen on the prior exam. No new bony abnormality is seen. IMPRESSION: Overall stable appearance of the chest with bilateral pleural effusions and lower lobe infiltrate/atelectasis. Electronically Signed   By: Inez Catalina M.D.   On: 12/26/2017 16:21   Dg Chest 2 View  Result Date: 12/20/2017 CLINICAL DATA:  Chest tightness with mild dyspnea. EXAM: CHEST - 2 VIEW COMPARISON:  12/01/2017 FINDINGS: Stable cardiomegaly with moderate aortic atherosclerosis. New bilateral small pleural effusions obscuring the diaphragms and spanning approximately of 1-1/2 to 2 vertebral body heights on the lateral view. New right IJ dialysis catheter tip terminates in the distal SVC. IMPRESSION: Cardiomegaly with new small bilateral pleural effusions. No overt pulmonary edema. New right IJ dialysis catheter is noted without apparent complication. Electronically Signed   By: Ashley Royalty M.D.   On: 12/20/2017 00:53   Mr Brain Wo Contrast  Result Date: 12/11/2017 CLINICAL DATA:  84 year old male dialysis patient with altered mental status, weakness, and slurred speech. EXAM: MRI HEAD WITHOUT CONTRAST TECHNIQUE: Multiplanar, multiecho pulse sequences of the brain and  surrounding structures were obtained without intravenous contrast. COMPARISON:  None. FINDINGS: Brain: Generalized cerebral volume loss with ex vacuo appearing ventricular enlargement. No restricted diffusion to suggest acute infarction. No midline shift, mass effect, evidence of mass lesion, extra-axial collection or acute intracranial hemorrhage. Cervicomedullary junction and pituitary are within normal limits. Pearline Cables and white matter signal is within normal limits for age throughout the brain. No cortical encephalomalacia or definite chronic cerebral blood products. Signal in the deep gray matter nuclei, brainstem, and cerebellum is normal for age. Vascular: Major intracranial vascular flow voids are preserved. Skull and upper cervical spine: Negative for age visible cervical spine. Normal bone marrow signal. Sinuses/Orbits: Normal orbits soft tissues. Paranasal sinuses and mastoids are well pneumatized. Other: Visible internal auditory structures appear normal. Scalp and face soft tissues appear negative. IMPRESSION: No acute intracranial abnormality. Negative for age noncontrast MRI appearance of the brain. Electronically Signed   By: Genevie Ann M.D.   On: 01/09/2018 14:27   Dg Chest Port 1 View  Result Date: 12/26/2017 CLINICAL DATA:  Syncope. EXAM: PORTABLE CHEST 1 VIEW COMPARISON:  12/26/2017. FINDINGS: Dual-lumen catheter with tip over superior vena cava. Cardiomegaly. Progressive right lower lobe infiltrate. Right pleural effusion again noted. No pneumothorax. IMPRESSION: 1.  Dual-lumen catheter with tip over superior vena cava. 2. Progressive right lower lobe infiltrate. Right pleural effusion again noted. Electronically Signed  By: Waldron   On: 12/26/2017 09:04     CBC Recent Labs  Lab 12/26/2017 0838  WBC 19.8*  HGB 11.7*  HCT 36.9*  PLT 272  MCV 74.6*  MCH 23.6*  MCHC 31.7*  RDW 24.7*    Chemistries  Recent Labs  Lab 12/19/2017 0935  NA 138  K 3.3*  CL 101  CO2 24   GLUCOSE 108*  BUN 31*  CREATININE 3.64*  CALCIUM 8.0*  MG 1.9  AST 300*  ALT 201*  ALKPHOS 150*  BILITOT 3.5*   ------------------------------------------------------------------------------------------------------------------ estimated creatinine clearance is 15 mL/min (A) (by C-G formula based on SCr of 3.64 mg/dL (H)). ------------------------------------------------------------------------------------------------------------------ No results for input(s): HGBA1C in the last 72 hours. ------------------------------------------------------------------------------------------------------------------ No results for input(s): CHOL, HDL, LDLCALC, TRIG, CHOLHDL, LDLDIRECT in the last 72 hours. ------------------------------------------------------------------------------------------------------------------ No results for input(s): TSH, T4TOTAL, T3FREE, THYROIDAB in the last 72 hours.  Invalid input(s): FREET3 ------------------------------------------------------------------------------------------------------------------ No results for input(s): VITAMINB12, FOLATE, FERRITIN, TIBC, IRON, RETICCTPCT in the last 72 hours.  Coagulation profile No results for input(s): INR, PROTIME in the last 168 hours.  No results for input(s): DDIMER in the last 72 hours.  Cardiac Enzymes Recent Labs  Lab 12/22/2017 0935 01/02/2018 1245 12/20/2017 1759  TROPONINI 0.63* 0.57* 0.56*   ------------------------------------------------------------------------------------------------------------------ Invalid input(s): POCBNP    Assessment & Plan   1.  Slurred speech and an unresponsive episode. MRI brain negative.   2.  Healthcare associated pneumonia, leukocytosis.    Continue cefepime continue vancomycin 3.   chronic hypoxic respiratory failure on oxygen.  4.  Chronic hypotension on midodrine continue midodrine 5.  History of congestive heart failure with mid range ejection fraction.  Dialysis to  manage fluid. 6.  End-stage renal disease.    Dialysis per nephrology 7.  History of gout 8.  Anemia of chronic disease 9.  Paroxysmal atrial fibrillation on amiodarone and aspirin 10.  Weakness physical therapy evaluation    Code Status History    Date Active Date Inactive Code Status Order ID Comments User Context   12/26/2017 2019 12/29/2017 1914 Full Code [id number]  Demetrios Loll, MD Inpatient   12/20/2017 0645 12/21/2017 1922 Full Code [id number]  Arta Silence, MD Inpatient   12/20/2017 0308 12/20/2017 0645 DNR [id number]  Amelia Jo, MD ED   12/02/2017 1128 12/17/2017 1611 DNR [id number]  Max Sane, MD Inpatient   12/01/2017 1522 12/02/2017 1128 Full Code [id number]  Demetrios Loll, MD Inpatient   10/30/2017 2256 11/02/2017 1709 Full Code [id number]  Lance Coon, MD Inpatient   07/26/2017 1728 07/28/2017 1620 Full Code [id number]  Saundra Shelling, MD Inpatient   04/08/2017 1555 04/10/2017 1526 Full Code [id number]  Dustin Flock, MD Inpatient           Consults  nephrolgy  DVT Prophylaxis  heparin  Lab Results  Component Value Date   PLT 272 12/16/2017     Time Spent in minutes  15min Greater than 50% of time spent in care coordination and counseling patient regarding the condition and plan of care.   Dustin Flock M.D on 01/11/2018 at 3:23 PM  Between 7am to 6pm - Pager - [phone]  After 6pm go to [email or web address] - Proofreader  [employer]   Office  [phone]

## 2018-01-11 NOTE — Progress Notes (Signed)
[facility]  ROUNDING NOTE   Subjective:  Patient had hemodialysis yesterday. Still appears quite weak. Pneumonia noted.   Objective:  Vital signs in last 24 hours:  Temp:  [97.9 F (36.6 C)-98.6 F (37 C)] 97.9 F (36.6 C) (06/01 0517) Pulse Rate:  [82-99] 84 (06/01 0517) Resp:  [15-24] 18 (06/01 0517) BP: (75-117)/(45-97) 98/61 (06/01 0517) SpO2:  [91 %-100 %] 91 % (06/01 0517)  Weight change:  Filed Weights   12/25/2017 0828  Weight: 68 kg (150 lb)    Intake/Output: No intake/output data recorded.   Intake/Output this shift:  Total I/O In: 50 [P.O.:50] Out: -   Physical Exam: General: NAD, laying in bed  Head: Normocephalic, atraumatic. Moist oral mucosal membranes  Eyes: Anicteric,   Neck: Supple, trachea midline  Lungs:  Basilar rales, normal effort  Heart: No rub S1S2  Abdomen:  Soft, nontender, BS present  Extremities: 1+ peripheral edema.   Neurologic: Nonfocal, moving all four extremities  Skin: Warm, dry, no rash  Access:  Right internal jugular permcath    Basic Metabolic Panel: Recent Labs  Lab 01/07/2018 0935 12/24/2017 1759  NA 138  --   K 3.3*  --   CL 101  --   CO2 24  --   GLUCOSE 108*  --   BUN 31*  --   CREATININE 3.64*  --   CALCIUM 8.0*  --   MG 1.9  --   PHOS 2.8 3.0    Liver Function Tests: Recent Labs  Lab 12/20/2017 0935  AST 300*  ALT 201*  ALKPHOS 150*  BILITOT 3.5*  PROT 5.1*  ALBUMIN 2.2*   No results for input(s): LIPASE, AMYLASE in the last 168 hours. No results for input(s): AMMONIA in the last 168 hours.  CBC: Recent Labs  Lab 12/12/2017 0838  WBC 19.8*  HGB 11.7*  HCT 36.9*  MCV 74.6*  PLT 272    Cardiac Enzymes: Recent Labs  Lab 12/23/2017 0935 01/01/2018 1245 12/11/2017 1759  TROPONINI 0.63* 0.57* 0.56*    BNP: Invalid input(s): POCBNP  CBG: No results for input(s): GLUCAP in the last 168 hours.  Microbiology: Results for orders placed or performed during the hospital encounter  of 12/28/2017  Blood Culture (routine x 2)     Status: None (Preliminary result)   Collection Time: 12/11/2017 10:06 AM  Result Value Ref Range Status   Specimen Description BLOOD LEFT ARM  Final   Special Requests   Final    BOTTLES DRAWN AEROBIC AND ANAEROBIC Blood Culture results may not be optimal due to an inadequate volume of blood received in culture bottles   Culture   Final    NO GROWTH < 24 HOURS Performed at [facility], [address], [place], [place] [address]    Report Status PENDING  Incomplete  Blood Culture (routine x 2)     Status: None (Preliminary result)   Collection Time: 12/26/2017 10:11 AM  Result Value Ref Range Status   Specimen Description BLOOD LEFT ARM  Final   Special Requests   Final    BOTTLES DRAWN AEROBIC AND ANAEROBIC Blood Culture results may not be optimal due to an inadequate volume of blood received in culture bottles   Culture   Final    NO GROWTH < 24 HOURS Performed at [facility], [address], [place], [place] [address]    Report Status PENDING  Incomplete  MRSA PCR Screening     Status: None  Collection Time: 01/04/2018  2:30 PM  Result Value Ref Range Status   MRSA by PCR NEGATIVE NEGATIVE Final    Comment:        The GeneXpert MRSA Assay (FDA approved for NASAL specimens only), is one component of a comprehensive MRSA colonization surveillance program. It is not intended to diagnose MRSA infection nor to guide or monitor treatment for MRSA infections. Performed at [facility], [address] [place], [place] [address]     Coagulation Studies: No results for input(s): LABPROT, INR in the last 72 hours.  Urinalysis: No results for input(s): COLORURINE, LABSPEC, PHURINE, GLUCOSEU, HGBUR, BILIRUBINUR, KETONESUR, PROTEINUR, UROBILINOGEN, NITRITE, LEUKOCYTESUR in the last 72 hours.  Invalid input(s): APPERANCEUR    Imaging: Mr Brain Wo Contrast  Result Date: 12/16/2017 CLINICAL DATA:   84 year old male dialysis patient with altered mental status, weakness, and slurred speech. EXAM: MRI HEAD WITHOUT CONTRAST TECHNIQUE: Multiplanar, multiecho pulse sequences of the brain and surrounding structures were obtained without intravenous contrast. COMPARISON:  None. FINDINGS: Brain: Generalized cerebral volume loss with ex vacuo appearing ventricular enlargement. No restricted diffusion to suggest acute infarction. No midline shift, mass effect, evidence of mass lesion, extra-axial collection or acute intracranial hemorrhage. Cervicomedullary junction and pituitary are within normal limits. Pearline Cables and white matter signal is within normal limits for age throughout the brain. No cortical encephalomalacia or definite chronic cerebral blood products. Signal in the deep gray matter nuclei, brainstem, and cerebellum is normal for age. Vascular: Major intracranial vascular flow voids are preserved. Skull and upper cervical spine: Negative for age visible cervical spine. Normal bone marrow signal. Sinuses/Orbits: Normal orbits soft tissues. Paranasal sinuses and mastoids are well pneumatized. Other: Visible internal auditory structures appear normal. Scalp and face soft tissues appear negative. IMPRESSION: No acute intracranial abnormality. Negative for age noncontrast MRI appearance of the brain. Electronically Signed   By: Genevie Ann M.D.   On: 01/04/2018 14:27   Dg Chest Port 1 View  Result Date: 12/15/2017 CLINICAL DATA:  Syncope. EXAM: PORTABLE CHEST 1 VIEW COMPARISON:  12/26/2017. FINDINGS: Dual-lumen catheter with tip over superior vena cava. Cardiomegaly. Progressive right lower lobe infiltrate. Right pleural effusion again noted. No pneumothorax. IMPRESSION: 1.  Dual-lumen catheter with tip over superior vena cava. 2. Progressive right lower lobe infiltrate. Right pleural effusion again noted. Electronically Signed   By: Marcello Moores  Register   On: 01/01/2018 09:04     Medications:   . [START ON 01/13/2018]  albumin human    . ceFEPime (MAXIPIME) IV Stopped (01/11/18 1220)   . amiodarone  200 mg Oral Daily  . aspirin EC  81 mg Oral Daily  . atorvastatin  40 mg Oral q1800  . collagenase   Topical Daily  . feeding supplement (NEPRO CARB STEADY)  237 mL Oral TID BM  . finasteride  5 mg Oral Daily  . fluconazole  100 mg Oral Daily  . fluticasone  2 spray Each Nare Daily  . magic mouthwash  15 mL Oral QID  . megestrol  200 mg Oral QHS  . midodrine  10 mg Oral TID WC  . mirtazapine  7.5 mg Oral QHS  . multivitamin  1 tablet Oral QHS  . pantoprazole  40 mg Oral Daily   benzonatate, HYDROcodone-acetaminophen, senna-docusate, sodium chloride  Assessment/ Plan:  [name] is a 84 y.o. black male with hypertension, hyperlipidemia, gout, ? history of prostate cancer, coronary artery disease, who was admitted to [facility] on 12/01/2017    1.  Acute renal failure with chronic kidney disease stage III with bland urinalysis. Baseline creatinine of 2.1, GFR of 37 on 10/15/17.  Acute renal failure secondary to acute cardiorenal syndrome, ATN from hypotensive event and then with overdiuresis -Suspect that the patient has likely progressed to end-stage renal disease as he has not shown significant improvement in overall renal function thus far.  Next dialysis treatment on Monday if still here.  2.  Right lower lobe pneumonia.  Likely secondary to aspiration.  Continue cefepime at this time.  3. LE edema, CAD s/p stent placement, A Fib, Severe MR, pulmonary hypertension - likely cause of his severe lower extremity edema -Continue ultrafiltration with dialysis as tolerated.  We may need to use albumin as an inpatient.  4.  H/o prostate cancer 2016-2017 Adenocarcinoma- Seen by Dr Erlene Quan in 07/2015- missed f/u since then Treated with Finasteride PSA is elevated.  Patient will need continued follow-up with urology.  5..  Anemia chronic kidney disease.  Hemoglobin 11.7.  Hold off on Epogen for now.   LOS:  0 Daren Yeagle 6/1/20191:26 PM

## 2018-01-11 NOTE — Clinical Social Work Note (Signed)
Clinical Social Work Assessment  Patient Details  Name: Andrew [name] MRN: [id number] Date of Birth: 07/20/1933  Date of referral:  01/11/18               Reason for consult:  Facility Placement                Permission sought to share information with:  Facility Art therapist granted to share information::     Name::        Agency::     Relationship::     Contact Information:     Housing/Transportation Living arrangements for the past 2 months:  Single Family Home Source of Information:  Adult Children, Medical Team Patient Interpreter Needed:  None Criminal Activity/Legal Involvement Pertinent to Current Situation/Hospitalization:  No - Comment as needed Significant Relationships:  Adult Children, Warehouse manager, Spouse Lives with:  Adult Children, Spouse Do you feel safe going back to the place where you live?  Yes Need for family participation in patient care:  No (Coment)  Care giving concerns:  PT recommendation for SNF   Social Worker assessment / plan:  The CSW contacted the patient's daughter, [name], to discuss discharge planning. The CSW explained the PT recommendation for SNF and described what to expect at SNF. [name] shared that she would discuss the options of SNF vs. Resumption of home health with her father and give a decision tomorrow. She did not give permission to begin the referral process until she has discussed this plan with her father.   The patient most recently discharged home with home health through [facility] and had a SNF recommendation at that admission as well. The patient has outpatient HD through [facility] and home o2 through [facility] that was set up at his last admission. If the patient does choose to discharge home with resumption of care, a HHSW may benefit him to address long term care options. The RNCM has also updated Patient [facility] that [facility]'s SW may be able to address the patient's frequency of  admissions. Palliative Care has seen the patient to begin goals of care discussion. CSW is following and will update once the patient and his family have chosen a discharge plan.  Employment status:  Retired Nurse, adult PT Recommendations:  [place] / Referral to community resources:     Patient/Family's Response to care:  The patient's daughter thanked the CSW.  Patient/Family's Understanding of and Emotional Response to Diagnosis, Current Treatment, and Prognosis:  The patient's family is beginning to understand that the patient's chronic illnesses are progressing.   Emotional Assessment Appearance:  Appears stated age Attitude/Demeanor/Rapport:  Lethargic Affect (typically observed):  Stable Orientation:  Oriented to Self, Oriented to Place, Oriented to  Time, Oriented to Situation Alcohol / Substance use:  Never Used Psych involvement (Current and /or in the community):  No (Comment)  Discharge Needs  Concerns to be addressed:  Care Coordination, Discharge Planning Concerns Readmission within the last 30 days:  Yes Current discharge risk:  Chronically ill Barriers to Discharge:  Continued Medical Work up   [employer], LCSW 01/11/2018, 4:26 PM

## 2018-01-11 DEATH — deceased

## 2018-01-12 LAB — PROCALCITONIN: Procalcitonin: 3.13 ng/mL

## 2018-01-12 MED ORDER — MIDODRINE HCL 5 MG PO TABS
20.0000 mg | ORAL_TABLET | Freq: Three times a day (TID) | ORAL | Status: DC
  Administered 2018-01-12 – 2018-01-16 (×4): 20 mg via ORAL
  Filled 2018-01-12 (×20): qty 4

## 2018-01-12 MED ORDER — COLCHICINE 0.6 MG PO TABS
0.6000 mg | ORAL_TABLET | Freq: Every day | ORAL | Status: DC
  Administered 2018-01-12: 0.6 mg via ORAL
  Filled 2018-01-12: qty 1

## 2018-01-12 MED ORDER — ALBUMIN HUMAN 25 % IV SOLN
25.0000 g | INTRAVENOUS | Status: DC
  Filled 2018-01-12 (×3): qty 100

## 2018-01-12 MED ORDER — TRAMADOL HCL 50 MG PO TABS
50.0000 mg | ORAL_TABLET | Freq: Three times a day (TID) | ORAL | Status: DC
  Administered 2018-01-12 – 2018-01-13 (×3): 50 mg via ORAL
  Filled 2018-01-12 (×4): qty 1

## 2018-01-12 NOTE — Progress Notes (Signed)
[place] at [facility]                                                                                                                                                                                  Patient Demographics   Andrew [name], is a 84 y.o. male, DOB - 11-19-1932, OEV:[id number]  Admit date - 12/13/2017   Admitting Physician Loletha Grayer, MD  Outpatient Primary MD for the patient is [name], MD   LOS - 0  Subjective:  c/o pain in leg and not feeling well  Review of Systems:   CONSTITUTIONAL: No documented fever. No fatigue, weakness. No weight gain, no weight loss.  EYES: No blurry or double vision.  ENT: No tinnitus. No postnasal drip. No redness of the oropharynx.  RESPIRATORY: + cough, no wheeze, no hemoptysis. + dyspnea.  CARDIOVASCULAR: No chest pain. No orthopnea. No palpitations. No syncope.  GASTROINTESTINAL: No nausea, no vomiting or diarrhea. No abdominal pain. No melena or hematochezia.  GENITOURINARY: No dysuria or hematuria.  ENDOCRINE: No polyuria or nocturia. No heat or cold intolerance.  HEMATOLOGY: No anemia. No bruising. No bleeding.  INTEGUMENTARY: No rashes. No lesions.  MUSCULOSKELETAL: leg pain NEUROLOGIC: No numbness, tingling, or ataxia. No seizure-type activity.  PSYCHIATRIC: No anxiety. No insomnia. No ADD.    Vitals:   Vitals:   01/11/18 1737 01/11/18 2124 01/11/18 2133 01/12/18 0611  BP: (!) 95/49 (!) 90/56  101/67  Pulse: 65 (!) 40 99 87  Resp: 18 16  16   Temp: 97.8 F (36.6 C) 97.9 F (36.6 C)  98.1 F (36.7 C)  TempSrc: Oral Oral  Oral  SpO2: 93% 93% (!) 82% 93%  Weight:      Height:        Wt Readings from Last 3 Encounters:  12/28/2017 68 kg (150 lb)  12/26/17 73.5 kg (162 lb)  12/21/17 72.4 kg (159 lb 11.2 oz)     Intake/Output Summary (Last 24 hours) at 01/12/2018 1232 Last data filed at 01/11/2018 1456 Gross per 24 hour  Intake 120 ml  Output -  Net 120 ml    Physical Exam:    GENERAL: Pleasant-appearing in no apparent distress.  HEAD, EYES, EARS, NOSE AND THROAT: Atraumatic, normocephalic. Extraocular muscles are intact. Pupils equal and reactive to light. Sclerae anicteric. No conjunctival injection. No oro-pharyngeal erythema.  NECK: Supple. There is no jugular venous distention. No bruits, no lymphadenopathy, no thyromegaly.  HEART: Regular rate and rhythm,. No murmurs, no rubs, no clicks.  LUNGS: Clear to auscultation bilaterally. No rales or rhonchi. No wheezes.  ABDOMEN: Soft, flat, nontender, nondistended. Has good bowel sounds. No hepatosplenomegaly appreciated.  EXTREMITIES:  No evidence of any cyanosis, clubbing, or peripheral edema.  +2 pedal and radial pulses bilaterally.  NEUROLOGIC: The patient is alert, awake, and oriented x3 with no focal motor or sensory deficits appreciated bilaterally.  SKIN: Moist and warm with no rashes appreciated.  Psych: Not anxious, depressed LN: No inguinal LN enlargement    Antibiotics   Anti-infectives (From admission, onward)   Start     Dose/Rate Route Frequency Ordered Stop   01/11/18 1000  ceFEPIme (MAXIPIME) 1 g in sodium chloride 0.9 % 100 mL IVPB     1 g 200 mL/hr over 30 Minutes Intravenous Every 24 hours 01/09/2018 1535     12/12/2017 1700  vancomycin (VANCOCIN) 500 mg in sodium chloride 0.9 % 100 mL IVPB     500 mg 100 mL/hr over 60 Minutes Intravenous  Once 12/27/2017 1536 01/05/2018 1708   12/20/2017 1115  fluconazole (DIFLUCAN) tablet 100 mg     100 mg Oral Daily 01/09/2018 1112     12/27/2017 1015  ceFEPIme (MAXIPIME) 2 g in sodium chloride 0.9 % 100 mL IVPB     2 g 200 mL/hr over 30 Minutes Intravenous  Once 12/24/2017 1005 12/13/2017 1127   12/11/2017 1015  vancomycin (VANCOCIN) IVPB 1000 mg/200 mL premix     1,000 mg 200 mL/hr over 60 Minutes Intravenous  Once 12/28/2017 1005 01/05/2018 1227      Medications   Scheduled Meds: . amiodarone  200 mg Oral Daily  . aspirin EC  81 mg Oral Daily  . atorvastatin  40  mg Oral q1800  . colchicine  0.6 mg Oral Daily  . collagenase   Topical Daily  . feeding supplement (NEPRO CARB STEADY)  237 mL Oral TID BM  . finasteride  5 mg Oral Daily  . fluconazole  100 mg Oral Daily  . fluticasone  2 spray Each Nare Daily  . lidocaine  15 mL Mouth/Throat Q6H  . magic mouthwash  15 mL Oral QID  . megestrol  200 mg Oral QHS  . midodrine  20 mg Oral TID WC  . mirtazapine  7.5 mg Oral QHS  . multivitamin  1 tablet Oral QHS  . pantoprazole  40 mg Oral Daily  . traMADol  50 mg Oral Q8H   Continuous Infusions: . [START ON 01/13/2018] albumin human    . ceFEPime (MAXIPIME) IV Stopped (01/12/18 1019)   PRN Meds:.benzonatate, HYDROcodone-acetaminophen, senna-docusate, sodium chloride   Data Review:   Micro Results Recent Results (from the past 240 hour(s))  Blood Culture (routine x 2)     Status: None (Preliminary result)   Collection Time: 12/29/2017 10:06 AM  Result Value Ref Range Status   Specimen Description BLOOD LEFT ARM  Final   Special Requests   Final    BOTTLES DRAWN AEROBIC AND ANAEROBIC Blood Culture results may not be optimal due to an inadequate volume of blood received in culture bottles   Culture   Final    NO GROWTH 2 DAYS Performed at [facility], [address], [place], [place] [address]    Report Status PENDING  Incomplete  Blood Culture (routine x 2)     Status: None (Preliminary result)   Collection Time: 12/24/2017 10:11 AM  Result Value Ref Range Status   Specimen Description BLOOD LEFT ARM  Final   Special Requests   Final    BOTTLES DRAWN AEROBIC AND ANAEROBIC Blood Culture results may not be optimal due to an inadequate volume of blood received in  culture bottles   Culture   Final    NO GROWTH 2 DAYS Performed at [facility], [address] [place], [place] [address]    Report Status PENDING  Incomplete  MRSA PCR Screening     Status: None   Collection Time: 12/23/2017  2:30 PM  Result Value Ref Range Status    MRSA by PCR NEGATIVE NEGATIVE Final    Comment:        The GeneXpert MRSA Assay (FDA approved for NASAL specimens only), is one component of a comprehensive MRSA colonization surveillance program. It is not intended to diagnose MRSA infection nor to guide or monitor treatment for MRSA infections. Performed at [facility], [address] [place], [place] [address]     Radiology Reports Dg Chest 2 View  Result Date: 12/26/2017 CLINICAL DATA:  Shortness of breath and weakness for several hours EXAM: CHEST - 2 VIEW COMPARISON:  12/20/2017 FINDINGS: Dialysis catheter is again identified. Stable cardiomegaly is seen. Small pleural effusions and bilateral lower lobe infiltrate/atelectasis is noted slightly greater on the right than the left. The overall appearance is similar to that seen on the prior exam. No new bony abnormality is seen. IMPRESSION: Overall stable appearance of the chest with bilateral pleural effusions and lower lobe infiltrate/atelectasis. Electronically Signed   By: Inez Catalina M.D.   On: 12/26/2017 16:21   Dg Chest 2 View  Result Date: 12/20/2017 CLINICAL DATA:  Chest tightness with mild dyspnea. EXAM: CHEST - 2 VIEW COMPARISON:  12/01/2017 FINDINGS: Stable cardiomegaly with moderate aortic atherosclerosis. New bilateral small pleural effusions obscuring the diaphragms and spanning approximately of 1-1/2 to 2 vertebral body heights on the lateral view. New right IJ dialysis catheter tip terminates in the distal SVC. IMPRESSION: Cardiomegaly with new small bilateral pleural effusions. No overt pulmonary edema. New right IJ dialysis catheter is noted without apparent complication. Electronically Signed   By: Ashley Royalty M.D.   On: 12/20/2017 00:53   Mr Brain Wo Contrast  Result Date: 12/15/2017 CLINICAL DATA:  84 year old male dialysis patient with altered mental status, weakness, and slurred speech. EXAM: MRI HEAD WITHOUT CONTRAST TECHNIQUE: Multiplanar,  multiecho pulse sequences of the brain and surrounding structures were obtained without intravenous contrast. COMPARISON:  None. FINDINGS: Brain: Generalized cerebral volume loss with ex vacuo appearing ventricular enlargement. No restricted diffusion to suggest acute infarction. No midline shift, mass effect, evidence of mass lesion, extra-axial collection or acute intracranial hemorrhage. Cervicomedullary junction and pituitary are within normal limits. Pearline Cables and white matter signal is within normal limits for age throughout the brain. No cortical encephalomalacia or definite chronic cerebral blood products. Signal in the deep gray matter nuclei, brainstem, and cerebellum is normal for age. Vascular: Major intracranial vascular flow voids are preserved. Skull and upper cervical spine: Negative for age visible cervical spine. Normal bone marrow signal. Sinuses/Orbits: Normal orbits soft tissues. Paranasal sinuses and mastoids are well pneumatized. Other: Visible internal auditory structures appear normal. Scalp and face soft tissues appear negative. IMPRESSION: No acute intracranial abnormality. Negative for age noncontrast MRI appearance of the brain. Electronically Signed   By: Genevie Ann M.D.   On: 12/19/2017 14:27   Dg Chest Port 1 View  Result Date: 12/16/2017 CLINICAL DATA:  Syncope. EXAM: PORTABLE CHEST 1 VIEW COMPARISON:  12/26/2017. FINDINGS: Dual-lumen catheter with tip over superior vena cava. Cardiomegaly. Progressive right lower lobe infiltrate. Right pleural effusion again noted. No pneumothorax. IMPRESSION: 1.  Dual-lumen catheter with tip over superior vena cava.  2. Progressive right lower lobe infiltrate. Right pleural effusion again noted. Electronically Signed   By: Marcello Moores  Register   On: 12/19/2017 09:04     CBC Recent Labs  Lab 12/14/2017 0838  WBC 19.8*  HGB 11.7*  HCT 36.9*  PLT 272  MCV 74.6*  MCH 23.6*  MCHC 31.7*  RDW 24.7*    Chemistries  Recent Labs  Lab 01/09/2018 0935   NA 138  K 3.3*  CL 101  CO2 24  GLUCOSE 108*  BUN 31*  CREATININE 3.64*  CALCIUM 8.0*  MG 1.9  AST 300*  ALT 201*  ALKPHOS 150*  BILITOT 3.5*   ------------------------------------------------------------------------------------------------------------------ estimated creatinine clearance is 15 mL/min (A) (by C-G formula based on SCr of 3.64 mg/dL (H)). ------------------------------------------------------------------------------------------------------------------ No results for input(s): HGBA1C in the last 72 hours. ------------------------------------------------------------------------------------------------------------------ No results for input(s): CHOL, HDL, LDLCALC, TRIG, CHOLHDL, LDLDIRECT in the last 72 hours. ------------------------------------------------------------------------------------------------------------------ No results for input(s): TSH, T4TOTAL, T3FREE, THYROIDAB in the last 72 hours.  Invalid input(s): FREET3 ------------------------------------------------------------------------------------------------------------------ No results for input(s): VITAMINB12, FOLATE, FERRITIN, TIBC, IRON, RETICCTPCT in the last 72 hours.  Coagulation profile No results for input(s): INR, PROTIME in the last 168 hours.  No results for input(s): DDIMER in the last 72 hours.  Cardiac Enzymes Recent Labs  Lab 12/31/2017 0935 01/05/2018 1245 12/12/2017 1759  TROPONINI 0.63* 0.57* 0.56*   ------------------------------------------------------------------------------------------------------------------ Invalid input(s): POCBNP    Assessment & Plan   1.    Acute encephalopathy due to pneumonia MRI negative for stroke 2.  Healthcare associated pneumonia, leukocytosis.    Continue cefepime continue vancomycin procalcitonin very high 3.   chronic hypoxic respiratory failure on oxygen.  4.  Chronic hypotension on midodrine continue midodrine 5.  History of congestive  heart failure with mid range ejection fraction.  Dialysis to manage fluid. 6.  End-stage renal disease.    Dialysis per nephrology 7.  History of gout 8.  Anemia of chronic disease 9.  Paroxysmal atrial fibrillation on amiodarone and aspirin 10.  Weakness physical therapy evaluation    Code Status History    Date Active Date Inactive Code Status Order ID Comments User Context   12/26/2017 2019 12/29/2017 1914 Full Code [id number]  Demetrios Loll, MD Inpatient   12/20/2017 0645 12/21/2017 1922 Full Code [id number]  Arta Silence, MD Inpatient   12/20/2017 0308 12/20/2017 0645 DNR [id number]  Amelia Jo, MD ED   12/02/2017 1128 12/17/2017 1611 DNR [id number]  Max Sane, MD Inpatient   12/01/2017 1522 12/02/2017 1128 Full Code [id number]  Demetrios Loll, MD Inpatient   10/30/2017 2256 11/02/2017 1709 Full Code [id number]  Lance Coon, MD Inpatient   07/26/2017 1728 07/28/2017 1620 Full Code [id number]  Saundra Shelling, MD Inpatient   04/08/2017 1555 04/10/2017 1526 Full Code [id number]  Dustin Flock, MD Inpatient           Consults  nephrolgy  DVT Prophylaxis  heparin  Lab Results  Component Value Date   PLT 272 12/29/2017     Time Spent in minutes  81min Greater than 50% of time spent in care coordination and counseling patient regarding the condition and plan of care.   Dustin Flock M.D on 01/12/2018 at 12:32 PM  Between 7am to 6pm - Pager - [phone]  After 6pm go to [email or web address] - Proofreader  [employer]   Office  [phone]

## 2018-01-12 NOTE — NC FL2 (Signed)
[place] LEVEL OF CARE SCREENING TOOL     IDENTIFICATION  Patient Name: Andrew [name] Birthdate: 12/21/1932 Sex: male Admission Date (Current Location): 12/12/2017  [place] and [place] Number:  Engineering geologist and Address:  [facility], [address], [place], [place] [address]      Provider Number: [id number]  Attending Physician Name and Address:  Dustin Flock, MD  Relative Name and Phone Number:       Current Level of Care: SNF Recommended Level of Care: [place] Prior Approval Number:    Date Approved/Denied:   PASRR Number: [id number]  Discharge Plan: SNF    Current Diagnoses: Patient Active Problem List   Diagnosis Date Noted  . Pneumonia 12/24/2017  . Protein-calorie malnutrition, severe 12/16/2017  . Physical deconditioning   . CHF (congestive heart failure) ([place]) 12/27/2017  . Fluid overload 12/26/2017  . NSTEMI (non-ST elevated myocardial infarction) ([place]) 12/20/2017  . ESRD (end stage renal disease) ([place]) 12/19/2017  . Atrial fibrillation ([place]) 12/19/2017  . Pressure injury of skin 12/02/2017  . Renal failure (ARF), acute on chronic (HCC) 12/01/2017  . Unstable angina ([place]) 10/31/2017  . Elevated troponin 10/30/2017  . Chronic systolic CHF (congestive heart failure) ([place]) 10/30/2017  . Acute posthemorrhagic anemia   . GERD (gastroesophageal reflux disease)   . PAD (peripheral artery disease) ([place]) 06/04/2016  . Colon polyp 06/29/2015  . Malignant neoplasm of prostate ([place]) 11/14/2014  . HLD (hyperlipidemia) 07/29/2014  . Long term current use of antithrombotics/antiplatelets 01/27/2014  . Arteriosclerosis of coronary artery 01/27/2014  . Carpal tunnel syndrome 12/11/2013  . Benign essential HTN 12/11/2013  . Lesion of ulnar nerve 12/11/2013  . Disorder of mitral valve 12/11/2013  . Arthritis, degenerative 12/11/2013  . Hereditary and idiopathic neuropathy 12/11/2013    Orientation  RESPIRATION BLADDER Height & Weight     Self, Time, Situation, Place  O2(2l o2) Continent Weight: 150 lb (68 kg) Height:  5\' 9"  (175.3 cm)  BEHAVIORAL SYMPTOMS/MOOD NEUROLOGICAL BOWEL NUTRITION STATUS      Continent Diet(Dysphagia 1 due to oral ulcerations)  AMBULATORY STATUS COMMUNICATION OF NEEDS Skin   Extensive Assist Verbally Skin abrasions, PU Stage and Appropriate Care                       Personal Care Assistance Level of Assistance  Bathing, Feeding, Dressing Bathing Assistance: Limited assistance Feeding assistance: Independent Dressing Assistance: Limited assistance     Functional Limitations Info  Sight, Hearing, Speech Sight Info: Adequate Hearing Info: Adequate Speech Info: Impaired(Slight slurring of speech at times)    SPECIAL CARE FACTORS FREQUENCY  PT (By licensed PT)     PT Frequency: Up to 5X per week              Contractures Contractures Info: Not present    Additional Factors Info  Code Status, Allergies Code Status Info: Full Allergies Info: No known allergies           Current Medications (01/12/2018):  This is the current hospital active medication list Current Facility-Administered Medications  Medication Dose Route Frequency Provider Last Rate Last Dose  . [START ON 01/13/2018] albumin human 25 % solution 25 g  25 g Intravenous Q M,W,F-HD Lateef, Munsoor, MD      . amiodarone (PACERONE) tablet 200 mg  200 mg Oral Daily Loletha Grayer, MD   200 mg at 01/12/18 0947  . aspirin EC tablet 81 mg  81 mg  Oral Daily Loletha Grayer, MD   81 mg at 01/12/18 0947  . atorvastatin (LIPITOR) tablet 40 mg  40 mg Oral q1800 Loletha Grayer, MD   40 mg at 01/11/18 1627  . benzonatate (TESSALON) capsule 200 mg  200 mg Oral TID PRN Loletha Grayer, MD      . ceFEPIme (MAXIPIME) 1 g in sodium chloride 0.9 % 100 mL IVPB  1 g Intravenous Q24H Loletha Grayer, MD   Stopped at 01/12/18 1019  . colchicine tablet 0.6 mg  0.6 mg Oral Daily Dustin Flock, MD   0.6 mg at 01/12/18 1131  . collagenase (SANTYL) ointment   Topical Daily Wieting, Richard, MD      . feeding supplement (NEPRO CARB STEADY) liquid 237 mL  237 mL Oral TID BM Pershing Proud, NP   388 mL at 01/12/18 0949  . finasteride (PROSCAR) tablet 5 mg  5 mg Oral Daily Loletha Grayer, MD   5 mg at 01/12/18 0947  . fluconazole (DIFLUCAN) tablet 100 mg  100 mg Oral Daily Loletha Grayer, MD   100 mg at 01/12/18 0952  . fluticasone (FLONASE) 50 MCG/ACT nasal spray 2 spray  2 spray Each Nare Daily Loletha Grayer, MD   2 spray at 01/12/18 0949  . HYDROcodone-acetaminophen (NORCO/VICODIN) 5-325 MG per tablet 1 tablet  1 tablet Oral Q4H PRN Loletha Grayer, MD   1 tablet at 01/12/18 0127  . lidocaine (XYLOCAINE) 2 % viscous mouth solution 15 mL  15 mL Mouth/Throat Q6H Dustin Flock, MD   15 mL at 01/11/18 2345  . magic mouthwash  15 mL Oral QID Pershing Proud, NP   15 mL at 01/12/18 0948  . megestrol (MEGACE) 40 MG/ML suspension 200 mg  200 mg Oral QHS Loletha Grayer, MD   200 mg at 01/11/18 2345  . midodrine (PROAMATINE) tablet 20 mg  20 mg Oral TID WC Dustin Flock, MD   20 mg at 01/12/18 1132  . mirtazapine (REMERON) tablet 7.5 mg  7.5 mg Oral QHS Lateef, Munsoor, MD   7.5 mg at 01/11/18 2346  . multivitamin (RENA-VIT) tablet 1 tablet  1 tablet Oral QHS Loletha Grayer, MD   1 tablet at 01/11/18 2346  . pantoprazole (PROTONIX) EC tablet 40 mg  40 mg Oral Daily Loletha Grayer, MD   40 mg at 01/12/18 0947  . senna-docusate (Senokot-S) tablet 1 tablet  1 tablet Oral QHS PRN Wieting, Richard, MD      . sodium chloride (OCEAN) 0.65 % nasal spray 1 spray  1 spray Each Nare PRN Wieting, Richard, MD      . traMADol Veatrice Bourbon) tablet 50 mg  50 mg Oral Q8H Dustin Flock, MD         Discharge Medications: Please see discharge summary for a list of discharge medications.  Relevant Imaging Results:  Relevant Lab Results:   Additional Information SS#  [id number]  Zettie Pho, LCSW

## 2018-01-12 NOTE — Clinical Social Work Note (Signed)
The CSW contacted the patient's daughter, [name], who reported that they have not had a chance to discuss the patient's wishes concerning discharge planning. [name] did give verbal permission to begin the referral process should her father choose SNF. The CSW has done such. [name] plans to have a decision by tomorrow morning. The CSW will provide bed offers as available and should the family wish to pursue SNF.  Santiago Bumpers, MSW, Latanya Presser [phone]

## 2018-01-12 NOTE — Progress Notes (Signed)
[facility]  ROUNDING NOTE   Subjective:  Patient resting comfortably in bed. Continues to be treated for pneumonia. Next dialysis will be tomorrow.   Objective:  Vital signs in last 24 hours:  Temp:  [97.8 F (36.6 C)-98.1 F (36.7 C)] 98.1 F (36.7 C) (06/02 0611) Pulse Rate:  [40-99] 87 (06/02 0611) Resp:  [16-18] 16 (06/02 0611) BP: (90-101)/(49-67) 101/67 (06/02 0611) SpO2:  [82 %-93 %] 93 % (06/02 0611)  Weight change:  Filed Weights   01/05/2018 0828  Weight: 68 kg (150 lb)    Intake/Output: I/O last 3 completed shifts: In: 170 [P.O.:170] Out: 0    Intake/Output this shift:  No intake/output data recorded.  Physical Exam: General: NAD, laying in bed  Head: Normocephalic, atraumatic. Moist oral mucosal membranes  Eyes: Anicteric,   Neck: Supple, trachea midline  Lungs:  Basilar rales, normal effort  Heart: No rub S1S2  Abdomen:  Soft, nontender, BS present  Extremities: 1+ peripheral edema.   Neurologic: Resting comfortably  Skin: Warm, dry, no rash  Access:  Right internal jugular permcath    Basic Metabolic Panel: Recent Labs  Lab 12/31/2017 0935 12/17/2017 1759  NA 138  --   K 3.3*  --   CL 101  --   CO2 24  --   GLUCOSE 108*  --   BUN 31*  --   CREATININE 3.64*  --   CALCIUM 8.0*  --   MG 1.9  --   PHOS 2.8 3.0    Liver Function Tests: Recent Labs  Lab 12/11/2017 0935  AST 300*  ALT 201*  ALKPHOS 150*  BILITOT 3.5*  PROT 5.1*  ALBUMIN 2.2*   No results for input(s): LIPASE, AMYLASE in the last 168 hours. No results for input(s): AMMONIA in the last 168 hours.  CBC: Recent Labs  Lab 12/28/2017 0838  WBC 19.8*  HGB 11.7*  HCT 36.9*  MCV 74.6*  PLT 272    Cardiac Enzymes: Recent Labs  Lab 01/09/2018 0935 12/31/2017 1245 12/15/2017 1759  TROPONINI 0.63* 0.57* 0.56*    BNP: Invalid input(s): POCBNP  CBG: No results for input(s): GLUCAP in the last 168 hours.  Microbiology: Results for orders placed or  performed during the hospital encounter of 12/14/2017  Blood Culture (routine x 2)     Status: None (Preliminary result)   Collection Time: 01/02/2018 10:06 AM  Result Value Ref Range Status   Specimen Description BLOOD LEFT ARM  Final   Special Requests   Final    BOTTLES DRAWN AEROBIC AND ANAEROBIC Blood Culture results may not be optimal due to an inadequate volume of blood received in culture bottles   Culture   Final    NO GROWTH 2 DAYS Performed at [facility], [address] [place], [place] [address]    Report Status PENDING  Incomplete  Blood Culture (routine x 2)     Status: None (Preliminary result)   Collection Time: 12/22/2017 10:11 AM  Result Value Ref Range Status   Specimen Description BLOOD LEFT ARM  Final   Special Requests   Final    BOTTLES DRAWN AEROBIC AND ANAEROBIC Blood Culture results may not be optimal due to an inadequate volume of blood received in culture bottles   Culture   Final    NO GROWTH 2 DAYS Performed at [facility], [address], [place], [place] [address]    Report Status PENDING  Incomplete  MRSA PCR Screening  Status: None   Collection Time: 12/13/2017  2:30 PM  Result Value Ref Range Status   MRSA by PCR NEGATIVE NEGATIVE Final    Comment:        The GeneXpert MRSA Assay (FDA approved for NASAL specimens only), is one component of a comprehensive MRSA colonization surveillance program. It is not intended to diagnose MRSA infection nor to guide or monitor treatment for MRSA infections. Performed at [facility], [address] [place], [place] [address]     Coagulation Studies: No results for input(s): LABPROT, INR in the last 72 hours.  Urinalysis: No results for input(s): COLORURINE, LABSPEC, PHURINE, GLUCOSEU, HGBUR, BILIRUBINUR, KETONESUR, PROTEINUR, UROBILINOGEN, NITRITE, LEUKOCYTESUR in the last 72 hours.  Invalid input(s): APPERANCEUR    Imaging: Mr Brain Wo Contrast  Result Date:  01/04/2018 CLINICAL DATA:  84 year old male dialysis patient with altered mental status, weakness, and slurred speech. EXAM: MRI HEAD WITHOUT CONTRAST TECHNIQUE: Multiplanar, multiecho pulse sequences of the brain and surrounding structures were obtained without intravenous contrast. COMPARISON:  None. FINDINGS: Brain: Generalized cerebral volume loss with ex vacuo appearing ventricular enlargement. No restricted diffusion to suggest acute infarction. No midline shift, mass effect, evidence of mass lesion, extra-axial collection or acute intracranial hemorrhage. Cervicomedullary junction and pituitary are within normal limits. Pearline Cables and white matter signal is within normal limits for age throughout the brain. No cortical encephalomalacia or definite chronic cerebral blood products. Signal in the deep gray matter nuclei, brainstem, and cerebellum is normal for age. Vascular: Major intracranial vascular flow voids are preserved. Skull and upper cervical spine: Negative for age visible cervical spine. Normal bone marrow signal. Sinuses/Orbits: Normal orbits soft tissues. Paranasal sinuses and mastoids are well pneumatized. Other: Visible internal auditory structures appear normal. Scalp and face soft tissues appear negative. IMPRESSION: No acute intracranial abnormality. Negative for age noncontrast MRI appearance of the brain. Electronically Signed   By: Genevie Ann M.D.   On: 01/04/2018 14:27     Medications:   . [START ON 01/13/2018] albumin human    . ceFEPime (MAXIPIME) IV Stopped (01/12/18 1019)   . amiodarone  200 mg Oral Daily  . aspirin EC  81 mg Oral Daily  . atorvastatin  40 mg Oral q1800  . colchicine  0.6 mg Oral Daily  . collagenase   Topical Daily  . feeding supplement (NEPRO CARB STEADY)  237 mL Oral TID BM  . finasteride  5 mg Oral Daily  . fluconazole  100 mg Oral Daily  . fluticasone  2 spray Each Nare Daily  . lidocaine  15 mL Mouth/Throat Q6H  . magic mouthwash  15 mL Oral QID  .  megestrol  200 mg Oral QHS  . midodrine  20 mg Oral TID WC  . mirtazapine  7.5 mg Oral QHS  . multivitamin  1 tablet Oral QHS  . pantoprazole  40 mg Oral Daily  . traMADol  50 mg Oral Q8H   benzonatate, HYDROcodone-acetaminophen, senna-docusate, sodium chloride  Assessment/ Plan:  Andrew [name] is a 84 y.o. black male with hypertension, hyperlipidemia, gout, ? history of prostate cancer, coronary artery disease, who was admitted to [facility] on 12/01/2017 for heart failure.  Admitted 01/02/2018 for hypoxia, AMS likely due to aspiration.    [facility]  1. Acute renal failure with chronic kidney disease stage III with bland urinalysis. Baseline creatinine of 2.1, GFR of 37 on 10/15/17.  Acute renal failure secondary to acute cardiorenal syndrome, ATN from hypotensive event and then with  overdiuresis -We continue to suspect that the patient has developed ESRD.  He remains quite weak.  We will plan for dialysis tomorrow.  We will likely declare the patient ESRD in the relative near future.  2.  Right lower lobe pneumonia.  Likely secondary to aspiration.  Patient being treated with cefepime.  3. LE edema, CAD s/p stent placement, A Fib, Severe MR, pulmonary hypertension - likely cause of his severe lower extremity edema -Maintain the patient on ultrafiltration with dialysis.  4.  H/o prostate cancer 2016-2017 Adenocarcinoma- Seen by Dr Erlene Quan in 07/2015- missed f/u since then Treated with Finasteride PSA is elevated.  Patient will need continued follow-up with urology.  5..  Anemia chronic kidney disease.  Hemoglobin 11.7.  Hold off on Epogen for now.   LOS: 0 Nolen Lindamood 6/2/20191:10 PM

## 2018-01-13 ENCOUNTER — Observation Stay: Payer: Medicare Other

## 2018-01-13 DIAGNOSIS — R1310 Dysphagia, unspecified: Secondary | ICD-10-CM | POA: Diagnosis not present

## 2018-01-13 DIAGNOSIS — A419 Sepsis, unspecified organism: Secondary | ICD-10-CM | POA: Diagnosis present

## 2018-01-13 DIAGNOSIS — I132 Hypertensive heart and chronic kidney disease with heart failure and with stage 5 chronic kidney disease, or end stage renal disease: Secondary | ICD-10-CM | POA: Diagnosis present

## 2018-01-13 DIAGNOSIS — Z7189 Other specified counseling: Secondary | ICD-10-CM | POA: Diagnosis not present

## 2018-01-13 DIAGNOSIS — R570 Cardiogenic shock: Secondary | ICD-10-CM | POA: Diagnosis not present

## 2018-01-13 DIAGNOSIS — R627 Adult failure to thrive: Secondary | ICD-10-CM | POA: Diagnosis not present

## 2018-01-13 DIAGNOSIS — J9621 Acute and chronic respiratory failure with hypoxia: Secondary | ICD-10-CM | POA: Diagnosis present

## 2018-01-13 DIAGNOSIS — E162 Hypoglycemia, unspecified: Secondary | ICD-10-CM | POA: Diagnosis present

## 2018-01-13 DIAGNOSIS — E785 Hyperlipidemia, unspecified: Secondary | ICD-10-CM | POA: Diagnosis present

## 2018-01-13 DIAGNOSIS — N186 End stage renal disease: Secondary | ICD-10-CM | POA: Diagnosis present

## 2018-01-13 DIAGNOSIS — Z515 Encounter for palliative care: Secondary | ICD-10-CM | POA: Diagnosis not present

## 2018-01-13 DIAGNOSIS — G9349 Other encephalopathy: Secondary | ICD-10-CM | POA: Diagnosis present

## 2018-01-13 DIAGNOSIS — I480 Paroxysmal atrial fibrillation: Secondary | ICD-10-CM | POA: Diagnosis present

## 2018-01-13 DIAGNOSIS — J189 Pneumonia, unspecified organism: Secondary | ICD-10-CM | POA: Diagnosis not present

## 2018-01-13 DIAGNOSIS — E43 Unspecified severe protein-calorie malnutrition: Secondary | ICD-10-CM | POA: Diagnosis present

## 2018-01-13 DIAGNOSIS — D62 Acute posthemorrhagic anemia: Secondary | ICD-10-CM | POA: Diagnosis not present

## 2018-01-13 DIAGNOSIS — Y95 Nosocomial condition: Secondary | ICD-10-CM | POA: Diagnosis present

## 2018-01-13 DIAGNOSIS — R6521 Severe sepsis with septic shock: Secondary | ICD-10-CM | POA: Diagnosis present

## 2018-01-13 DIAGNOSIS — R5381 Other malaise: Secondary | ICD-10-CM | POA: Diagnosis not present

## 2018-01-13 DIAGNOSIS — B370 Candidal stomatitis: Secondary | ICD-10-CM | POA: Diagnosis present

## 2018-01-13 DIAGNOSIS — Z992 Dependence on renal dialysis: Secondary | ICD-10-CM | POA: Diagnosis not present

## 2018-01-13 DIAGNOSIS — N2581 Secondary hyperparathyroidism of renal origin: Secondary | ICD-10-CM | POA: Diagnosis present

## 2018-01-13 DIAGNOSIS — Z9911 Dependence on respirator [ventilator] status: Secondary | ICD-10-CM | POA: Diagnosis not present

## 2018-01-13 DIAGNOSIS — I059 Rheumatic mitral valve disease, unspecified: Secondary | ICD-10-CM | POA: Diagnosis present

## 2018-01-13 DIAGNOSIS — R42 Dizziness and giddiness: Secondary | ICD-10-CM | POA: Diagnosis present

## 2018-01-13 DIAGNOSIS — K922 Gastrointestinal hemorrhage, unspecified: Secondary | ICD-10-CM | POA: Diagnosis present

## 2018-01-13 DIAGNOSIS — J181 Lobar pneumonia, unspecified organism: Secondary | ICD-10-CM | POA: Diagnosis present

## 2018-01-13 DIAGNOSIS — I5043 Acute on chronic combined systolic (congestive) and diastolic (congestive) heart failure: Secondary | ICD-10-CM | POA: Diagnosis present

## 2018-01-13 DIAGNOSIS — N170 Acute kidney failure with tubular necrosis: Secondary | ICD-10-CM | POA: Diagnosis present

## 2018-01-13 DIAGNOSIS — I2510 Atherosclerotic heart disease of native coronary artery without angina pectoris: Secondary | ICD-10-CM | POA: Diagnosis present

## 2018-01-13 LAB — CBC WITH DIFFERENTIAL/PLATELET
BAND NEUTROPHILS: 1 %
BASOS ABS: 0 10*3/uL (ref 0–0.1)
BLASTS: 0 %
Basophils Relative: 0 %
EOS ABS: 0 10*3/uL (ref 0–0.7)
Eosinophils Relative: 0 %
HEMATOCRIT: 30 % — AB (ref 40.0–52.0)
Hemoglobin: 9.5 g/dL — ABNORMAL LOW (ref 13.0–18.0)
Lymphocytes Relative: 4 %
Lymphs Abs: 0.7 10*3/uL — ABNORMAL LOW (ref 1.0–3.6)
MCH: 24.1 pg — ABNORMAL LOW (ref 26.0–34.0)
MCHC: 31.7 g/dL — ABNORMAL LOW (ref 32.0–36.0)
MCV: 76 fL — AB (ref 80.0–100.0)
METAMYELOCYTES PCT: 0 %
Monocytes Absolute: 0.8 10*3/uL (ref 0.2–1.0)
Monocytes Relative: 5 %
Myelocytes: 0 %
Neutro Abs: 15.4 10*3/uL — ABNORMAL HIGH (ref 1.4–6.5)
Neutrophils Relative %: 90 %
Other: 0 %
PROMYELOCYTES RELATIVE: 0 %
Platelets: 211 10*3/uL (ref 150–440)
RBC: 3.95 MIL/uL — ABNORMAL LOW (ref 4.40–5.90)
RDW: 37 % — ABNORMAL HIGH (ref 11.5–14.5)
WBC: 16.9 10*3/uL — ABNORMAL HIGH (ref 3.8–10.6)
nRBC: 1 /100 WBC — ABNORMAL HIGH

## 2018-01-13 LAB — PROCALCITONIN: Procalcitonin: 3.05 ng/mL

## 2018-01-13 LAB — HEPATIC FUNCTION PANEL
ALT: 152 U/L — AB (ref 17–63)
AST: 186 U/L — AB (ref 15–41)
Albumin: 2.2 g/dL — ABNORMAL LOW (ref 3.5–5.0)
Alkaline Phosphatase: 102 U/L (ref 38–126)
BILIRUBIN DIRECT: 1.7 mg/dL — AB (ref 0.1–0.5)
BILIRUBIN TOTAL: 3.4 mg/dL — AB (ref 0.3–1.2)
Indirect Bilirubin: 1.7 mg/dL — ABNORMAL HIGH (ref 0.3–0.9)
Total Protein: 4.7 g/dL — ABNORMAL LOW (ref 6.5–8.1)

## 2018-01-13 LAB — PATHOLOGIST SMEAR REVIEW

## 2018-01-13 LAB — PROTIME-INR
INR: 1.19
Prothrombin Time: 15 seconds (ref 11.4–15.2)

## 2018-01-13 LAB — APTT: APTT: 34 s (ref 24–36)

## 2018-01-13 MED ORDER — COLCHICINE 0.6 MG PO TABS
0.3000 mg | ORAL_TABLET | ORAL | Status: DC
  Filled 2018-01-13: qty 0.5

## 2018-01-13 MED ORDER — SODIUM CHLORIDE 0.9 % IV SOLN
3.0000 g | Freq: Two times a day (BID) | INTRAVENOUS | Status: DC
  Administered 2018-01-13 – 2018-01-16 (×6): 3 g via INTRAVENOUS
  Filled 2018-01-13 (×7): qty 3

## 2018-01-13 NOTE — Plan of Care (Signed)
Patient has difficulty swallowing and very poor appetite.  He is extremely weak.  Andrew [name]

## 2018-01-13 NOTE — Progress Notes (Signed)
Post HD assessment. Pt tolerated tx well without c/o or complication. Net UF 369, goal not met r/t decreased BP, MD aware.    01/13/18 1251  Vital Signs  Temp 98.9 F (37.2 C)  Temp Source Axillary  Pulse Rate 91  Pulse Rate Source Monitor  Resp (!) 21  BP 100/63  BP Location Right Arm  BP Method Automatic  Patient Position (if appropriate) Lying  Oxygen Therapy  SpO2 100 %  O2 Device Nasal Cannula  O2 Flow Rate (L/min) 2 L/min  Dialysis Weight  Weight 70.8 kg (156 lb 1.4 oz)  Type of Weight Post-Dialysis  Post-Hemodialysis Assessment  Rinseback Volume (mL) 250 mL  KECN 80.7 V  Dialyzer Clearance Lightly streaked  Duration of HD Treatment -hour(s) 3.5 hour(s)  Hemodialysis Intake (mL) 500 mL  UF Total -Machine (mL) 869 mL  Net UF (mL) 369 mL  Education / Care Plan  Dialysis Education Provided Yes  Documented Education in Care Plan Yes

## 2018-01-13 NOTE — Clinical Social Work Note (Signed)
CSW informed during progression rounds that Palliative Care is pending and that currently patient would be unable to sit up for hemodialysis in an outpatient setting. Shela Leff MSW,LCSW [phone]

## 2018-01-13 NOTE — Progress Notes (Signed)
Pre HD assessment    01/13/18 0846  Neurological  Level of Consciousness Responds to Voice  Orientation Level Oriented to person  Respiratory  Respiratory Pattern Regular;Unlabored  Chest Assessment Chest expansion symmetrical  Bilateral Breath Sounds Diminished  Cardiac  ECG Monitor Yes  Antiarrhythmic device No  Vascular  R Radial Pulse +1  L Radial Pulse +1  Integumentary  Integumentary (WDL) X  Skin Color Appropriate for ethnicity  Musculoskeletal  Musculoskeletal (WDL) X  Generalized Weakness Yes  Assistive Device None  GU Assessment  Genitourinary (WDL) X  Genitourinary Symptoms  (HD)  Psychosocial  Psychosocial (WDL) X  Patient Behaviors Not interactive  Emotional support given Given to patient

## 2018-01-13 NOTE — Progress Notes (Signed)
SLP Cancellation Note  Patient Details Name: Andrew [name] MRN: [id number] DOB: [birth year]   Cancelled treatment:       Reason Eval/Treat Not Completed: Fatigue/lethargy limiting ability to participate(chart reviewed; NSG consulted SLP). NSG is concerned re: pt's oral pocketing of po's including medications earlier today as well as re: his decreased alertness and lethargy for oral intake. Instructed NSG to remove dinner tray and to not given po's(including pills) if pt is not alert to safely take/swallow d/t the high risk for aspiration - noted pt's full code status. ST services will f/u w/ pt's status tomorrow. NSG agreed.     Orinda Kenner, MS, CCC-SLP Nayomi Tabron 01/13/2018, 5:37 PM

## 2018-01-13 NOTE — Progress Notes (Signed)
PT Cancellation Note  Patient Details Name: Andrew [name] MRN: [id number] DOB: 08/03/1933   Cancelled Treatment:    Reason Eval/Treat Not Completed: Fatigue/lethargy limiting ability to participate;Other (comment). Treatment attempted; pt very lethargic. Pt's family wishes treatment attempt. With attempted vitals and then lower extremity range, pt reactively swings toward therapist. When left alone pt peacefully falls to sleep. No further attempt; family agreeable. Re attempt tomorrow as pt able.    Larae Grooms, PTA 01/13/2018, 3:45 PM

## 2018-01-13 NOTE — Progress Notes (Signed)
Pink frothy sputum coming out of pt's mouth, present when pt arrived, MD aware    01/13/18 0846  Neurological  Level of Consciousness Responds to Voice  Orientation Level Oriented to person  Respiratory  Respiratory Pattern Regular;Unlabored  Chest Assessment Chest expansion symmetrical  Bilateral Breath Sounds Diminished  Cardiac  ECG Monitor Yes  Antiarrhythmic device No  Vascular  R Radial Pulse +1  L Radial Pulse +1  Edema Right upper extremity;Left upper extremity;Right lower extremity;Left lower extremity;Generalized  Integumentary  Integumentary (WDL) X  Skin Color Appropriate for ethnicity  Musculoskeletal  Musculoskeletal (WDL) X  Generalized Weakness Yes  Assistive Device None  GU Assessment  Genitourinary (WDL) X  Genitourinary Symptoms  (HD)  Psychosocial  Psychosocial (WDL) X  Patient Behaviors Not interactive  Emotional support given Given to patient

## 2018-01-13 NOTE — Progress Notes (Signed)
[place] [facility]                                                                                                                                                                                  Patient Demographics   Andrew [name], is a 84 y.o. male, DOB - Feb 10, 1933, MWN:[id number]  Admit date - 12/14/2017   Admitting Physician Loletha Grayer, MD  Outpatient Primary MD for the patient is [name], MD   LOS - 0  Subjective: Patient now more lethargic  Review of Systems:   CONSTITUTIONAL: Unable to provide due to worsening condition  Vitals:   Vitals:   01/13/18 1230 01/13/18 1244 01/13/18 1251 01/13/18 1321  BP: (!) 95/57 107/68 100/63 (!) 102/57  Pulse: 86 89 91   Resp: 18 20 (!) 21 20  Temp:   98.9 F (37.2 C) 98.7 F (37.1 C)  TempSrc:   Axillary Axillary  SpO2: 100% 100% 100% 100%  Weight:   70.8 kg (156 lb 1.4 oz)   Height:        Wt Readings from Last 3 Encounters:  01/13/18 70.8 kg (156 lb 1.4 oz)  12/26/17 73.5 kg (162 lb)  12/21/17 72.4 kg (159 lb 11.2 oz)     Intake/Output Summary (Last 24 hours) at 01/13/2018 1553 Last data filed at 01/13/2018 1251 Gross per 24 hour  Intake 100 ml  Output 369 ml  Net -269 ml    Physical Exam:   GENERAL: Chronically ill-appearing HEAD, EYES, EARS, NOSE AND THROAT: Atraumatic, normocephalic. Extraocular muscles are intact. Pupils equal and reactive to light. Sclerae anicteric. No conjunctival injection. No oro-pharyngeal erythema.  NECK: Supple. There is no jugular venous distention. No bruits, no lymphadenopathy, no thyromegaly.  HEART: Regular rate and rhythm,. No murmurs, no rubs, no clicks.  LUNGS: Rhonchus breath sounds bilaterally ABDOMEN: Soft, flat, nontender, nondistended. Has good bowel sounds. No hepatosplenomegaly appreciated.  EXTREMITIES: No evidence of any cyanosis, clubbing, or 2+ peripheral edema.  +2 pedal and radial pulses bilaterally.  NEUROLOGIC: Drowsy  sKIN:  Moist and warm with no rashes appreciated.  Psych: Drowsy LN: No inguinal LN enlargement    Antibiotics   Anti-infectives (From admission, onward)   Start     Dose/Rate Route Frequency Ordered Stop   01/13/18 1700  Ampicillin-Sulbactam (UNASYN) 3 g in sodium chloride 0.9 % 100 mL IVPB     3 g 200 mL/hr over 30 Minutes Intravenous Every 12 hours 01/13/18 1450     01/11/18 1000  ceFEPIme (MAXIPIME) 1 g in sodium chloride 0.9 % 100 mL IVPB  Status:  Discontinued     1 g 200 mL/hr over 30  Minutes Intravenous Every 24 hours 12/16/2017 1535 01/13/18 1443   12/23/2017 1700  vancomycin (VANCOCIN) 500 mg in sodium chloride 0.9 % 100 mL IVPB     500 mg 100 mL/hr over 60 Minutes Intravenous  Once 12/12/2017 1536 12/20/2017 1708   01/05/2018 1115  fluconazole (DIFLUCAN) tablet 100 mg     100 mg Oral Daily 12/18/2017 1112     12/15/2017 1015  ceFEPIme (MAXIPIME) 2 g in sodium chloride 0.9 % 100 mL IVPB     2 g 200 mL/hr over 30 Minutes Intravenous  Once 12/22/2017 1005 12/27/2017 1127   01/05/2018 1015  vancomycin (VANCOCIN) IVPB 1000 mg/200 mL premix     1,000 mg 200 mL/hr over 60 Minutes Intravenous  Once 12/30/2017 1005 12/13/2017 1227      Medications   Scheduled Meds: . amiodarone  200 mg Oral Daily  . aspirin EC  81 mg Oral Daily  . atorvastatin  40 mg Oral q1800  . [START ON 01/14/2018] colchicine  0.3 mg Oral Once per day on Mon Thu  . collagenase   Topical Daily  . feeding supplement (NEPRO CARB STEADY)  237 mL Oral TID BM  . finasteride  5 mg Oral Daily  . fluconazole  100 mg Oral Daily  . fluticasone  2 spray Each Nare Daily  . lidocaine  15 mL Mouth/Throat Q6H  . magic mouthwash  15 mL Oral QID  . megestrol  200 mg Oral QHS  . midodrine  20 mg Oral TID WC  . mirtazapine  7.5 mg Oral QHS  . multivitamin  1 tablet Oral QHS  . pantoprazole  40 mg Oral Daily   Continuous Infusions: . albumin human    . ampicillin-sulbactam (UNASYN) IV     PRN Meds:.benzonatate, senna-docusate, sodium  chloride   Data Review:   Micro Results Recent Results (from the past 240 hour(s))  Blood Culture (routine x 2)     Status: None (Preliminary result)   Collection Time: 01/04/2018 10:06 AM  Result Value Ref Range Status   Specimen Description BLOOD LEFT ARM  Final   Special Requests   Final    BOTTLES DRAWN AEROBIC AND ANAEROBIC Blood Culture results may not be optimal due to an inadequate volume of blood received in culture bottles   Culture   Final    NO GROWTH 2 DAYS Performed at [facility], [address] [place], [place] [address]    Report Status PENDING  Incomplete  Blood Culture (routine x 2)     Status: None (Preliminary result)   Collection Time: 12/30/2017 10:11 AM  Result Value Ref Range Status   Specimen Description BLOOD LEFT ARM  Final   Special Requests   Final    BOTTLES DRAWN AEROBIC AND ANAEROBIC Blood Culture results may not be optimal due to an inadequate volume of blood received in culture bottles   Culture   Final    NO GROWTH 2 DAYS Performed at [facility], [address], [place],  [address]    Report Status PENDING  Incomplete  MRSA PCR Screening     Status: None   Collection Time: 01/09/2018  2:30 PM  Result Value Ref Range Status   MRSA by PCR NEGATIVE NEGATIVE Final    Comment:        The GeneXpert MRSA Assay (FDA approved for NASAL specimens only), is one component of a comprehensive MRSA colonization surveillance program. It is not intended to diagnose MRSA infection nor to  guide or monitor treatment for MRSA infections. Performed at [facility], [address] [place], [place] [address]     Radiology Reports Dg Chest 2 View  Result Date: 12/26/2017 CLINICAL DATA:  Shortness of breath and weakness for several hours EXAM: CHEST - 2 VIEW COMPARISON:  12/20/2017 FINDINGS: Dialysis catheter is again identified. Stable cardiomegaly is seen. Small pleural effusions and bilateral lower lobe  infiltrate/atelectasis is noted slightly greater on the right than the left. The overall appearance is similar to that seen on the prior exam. No new bony abnormality is seen. IMPRESSION: Overall stable appearance of the chest with bilateral pleural effusions and lower lobe infiltrate/atelectasis. Electronically Signed   By: Inez Catalina M.D.   On: 12/26/2017 16:21   Dg Chest 2 View  Result Date: 12/20/2017 CLINICAL DATA:  Chest tightness with mild dyspnea. EXAM: CHEST - 2 VIEW COMPARISON:  12/01/2017 FINDINGS: Stable cardiomegaly with moderate aortic atherosclerosis. New bilateral small pleural effusions obscuring the diaphragms and spanning approximately of 1-1/2 to 2 vertebral body heights on the lateral view. New right IJ dialysis catheter tip terminates in the distal SVC. IMPRESSION: Cardiomegaly with new small bilateral pleural effusions. No overt pulmonary edema. New right IJ dialysis catheter is noted without apparent complication. Electronically Signed   By: Ashley Royalty M.D.   On: 12/20/2017 00:53   Mr Brain Wo Contrast  Result Date: 12/24/2017 CLINICAL DATA:  84 year old male dialysis patient with altered mental status, weakness, and slurred speech. EXAM: MRI HEAD WITHOUT CONTRAST TECHNIQUE: Multiplanar, multiecho pulse sequences of the brain and surrounding structures were obtained without intravenous contrast. COMPARISON:  None. FINDINGS: Brain: Generalized cerebral volume loss with ex vacuo appearing ventricular enlargement. No restricted diffusion to suggest acute infarction. No midline shift, mass effect, evidence of mass lesion, extra-axial collection or acute intracranial hemorrhage. Cervicomedullary junction and pituitary are within normal limits. Pearline Cables and white matter signal is within normal limits for age throughout the brain. No cortical encephalomalacia or definite chronic cerebral blood products. Signal in the deep gray matter nuclei, brainstem, and cerebellum is normal for age.  Vascular: Major intracranial vascular flow voids are preserved. Skull and upper cervical spine: Negative for age visible cervical spine. Normal bone marrow signal. Sinuses/Orbits: Normal orbits soft tissues. Paranasal sinuses and mastoids are well pneumatized. Other: Visible internal auditory structures appear normal. Scalp and face soft tissues appear negative. IMPRESSION: No acute intracranial abnormality. Negative for age noncontrast MRI appearance of the brain. Electronically Signed   By: Genevie Ann M.D.   On: 12/15/2017 14:27   Dg Chest Port 1 View  Result Date: 01/13/2018 CLINICAL DATA:  Pneumonia. History of prostate cancer, hypertension and mitral valve disorder. EXAM: PORTABLE CHEST 1 VIEW COMPARISON:  Radiographs 12/26/2017 and 12/26/2017. FINDINGS: 1452 hours. Right IJ hemodialysis catheters are unchanged near the superior cavoatrial junction. The heart size and mediastinal contours are stable. There are increased pleural effusions and right-greater-than-left basilar airspace opacities. There is probable mild edema. No pneumothorax. The bones appear unchanged. IMPRESSION: Worsening pleural effusions, bibasilar pulmonary opacities and probable edema. Electronically Signed   By: Richardean Sale M.D.   On: 01/13/2018 15:15   Dg Chest Port 1 View  Result Date: 12/13/2017 CLINICAL DATA:  Syncope. EXAM: PORTABLE CHEST 1 VIEW COMPARISON:  12/26/2017. FINDINGS: Dual-lumen catheter with tip over superior vena cava. Cardiomegaly. Progressive right lower lobe infiltrate. Right pleural effusion again noted. No pneumothorax. IMPRESSION: 1.  Dual-lumen catheter with tip over superior vena cava. 2. Progressive right lower lobe infiltrate.  Right pleural effusion again noted. Electronically Signed   By: Marcello Moores  Register   On: 01/02/2018 09:04     CBC Recent Labs  Lab 12/26/2017 0838 01/13/18 1011  WBC 19.8* 16.9*  HGB 11.7* 9.5*  HCT 36.9* 30.0*  PLT 272 211  MCV 74.6* 76.0*  MCH 23.6* 24.1*  MCHC 31.7*  31.7*  RDW 24.7* 37.0*  LYMPHSABS  --  0.7*  MONOABS  --  0.8  EOSABS  --  0.0  BASOSABS  --  0.0    Chemistries  Recent Labs  Lab 12/16/2017 0935  NA 138  K 3.3*  CL 101  CO2 24  GLUCOSE 108*  BUN 31*  CREATININE 3.64*  CALCIUM 8.0*  MG 1.9  AST 300*  ALT 201*  ALKPHOS 150*  BILITOT 3.5*   ------------------------------------------------------------------------------------------------------------------ estimated creatinine clearance is 15.6 mL/min (A) (by C-G formula based on SCr of 3.64 mg/dL (H)). ------------------------------------------------------------------------------------------------------------------ No results for input(s): HGBA1C in the last 72 hours. ------------------------------------------------------------------------------------------------------------------ No results for input(s): CHOL, HDL, LDLCALC, TRIG, CHOLHDL, LDLDIRECT in the last 72 hours. ------------------------------------------------------------------------------------------------------------------ No results for input(s): TSH, T4TOTAL, T3FREE, THYROIDAB in the last 72 hours.  Invalid input(s): FREET3 ------------------------------------------------------------------------------------------------------------------ No results for input(s): VITAMINB12, FOLATE, FERRITIN, TIBC, IRON, RETICCTPCT in the last 72 hours.  Coagulation profile No results for input(s): INR, PROTIME in the last 168 hours.  No results for input(s): DDIMER in the last 72 hours.  Cardiac Enzymes Recent Labs  Lab 12/27/2017 0935 12/21/2017 1245 01/05/2018 1759  TROPONINI 0.63* 0.57* 0.56*   ------------------------------------------------------------------------------------------------------------------ Invalid input(s): POCBNP    Assessment & Plan   1.    Acute encephalopathy due to pneumonia : Patient currently worse MRI negative for stroke 2.  Healthcare associated pneumonia, leukocytosis.    WBC and  procalcitonin continues to be elevated I will change antibiotics to Unasyn repeat chest x-ray 3.    Acute on chronic chronic hypoxic respiratory failure on oxygen.  4.  Chronic hypotension on midodrine continue midodrine 5.  History of congestive heart failure with mid range ejection fraction.  Dialysis to manage fluid. 6.  End-stage renal disease.    Dialysis per nephrology 7.  History of gout 8.  Anemia of chronic disease 9.  Paroxysmal atrial fibrillation on amiodarone and aspirin 10.  Weakness physical therapy evaluation   Prognosis very poor high risk of death I explained to the daughter palliative care consults pending   Code Status History    Date Active Date Inactive Code Status Order ID Comments User Context   12/26/2017 2019 12/29/2017 1914 Full Code [id number]  Demetrios Loll, MD Inpatient   12/20/2017 0645 12/21/2017 1922 Full Code [id number]  Arta Silence, MD Inpatient   12/20/2017 0308 12/20/2017 0645 DNR [id number]  Amelia Jo, MD ED   12/02/2017 1128 12/17/2017 1611 DNR [id number]  Max Sane, MD Inpatient   12/01/2017 1522 12/02/2017 1128 Full Code [id number]  Demetrios Loll, MD Inpatient   10/30/2017 2256 11/02/2017 1709 Full Code [id number]  Lance Coon, MD Inpatient   07/26/2017 1728 07/28/2017 1620 Full Code [id number]  Saundra Shelling, MD Inpatient   04/08/2017 1555 04/10/2017 1526 Full Code [id number]  Dustin Flock, MD Inpatient           Consults  nephrolgy  DVT Prophylaxis  heparin  Lab Results  Component Value Date   PLT 211 01/13/2018     Time Spent in minutes  31min Greater than 50% of time spent in care coordination and counseling patient regarding the condition and plan of  care.   Dustin Flock M.D on 01/13/2018 at 3:53 PM  Between 7am to 6pm - Pager - [phone]  After 6pm go to [email or web address] - Proofreader  [employer]   Office  [phone]

## 2018-01-13 NOTE — Consult Note (Signed)
Pharmacy Antibiotic Note  Andrew [name] is a 84 y.o. male admitted on 12/24/2017 with pneumonia.  Pharmacy has been consulted for unasyn dosing.  Plan: Pt is currently undergoing HD. He is new to HD and is accessed daily for HD needs. Will start Unasyn 3g q 12 hr  Height: 5\' 9"  (175.3 cm) Weight: 156 lb 1.4 oz (70.8 kg) IBW/kg (Calculated) : 70.7  Temp (24hrs), Avg:98.5 F (36.9 C), Min:97.8 F (36.6 C), Max:98.9 F (37.2 C)  Recent Labs  Lab 12/13/2017 0838 12/14/2017 0935 12/22/2017 1006 12/13/2017 1245 01/13/18 1011  WBC 19.8*  --   --   --  16.9*  CREATININE  --  3.64*  --   --   --   LATICACIDVEN  --   --  4.3* 4.6*  --     Estimated Creatinine Clearance: 15.6 mL/min (A) (by C-G formula based on SCr of 3.64 mg/dL (H)).    No Known Allergies  Antimicrobials this admission: cefepime 5/31 >> 6/3 vanc 5/31 >>5/31 unasyn 6/3>>    Dose adjustments this admission:   Microbiology results: 5/31 BCx: NG  5/31 MRSA PCR: neg  Thank you for allowing pharmacy to be a part of this patient's care.  Ramond Dial, Pharm.D, BCPS Clinical Pharmacist  01/13/2018 2:50 PM

## 2018-01-13 NOTE — Progress Notes (Signed)
PT Cancellation Note  Patient Details Name: Andrew [name] MRN: [id number] DOB: [birth year]   Cancelled Treatment:    Reason Eval/Treat Not Completed: Patient at procedure or test/unavailable(Patient off unit for dialysis this AM; unavailable for participation with therapy.  Will re-attempt at later time/date as medically appropriate and available.)   [name], PT, DPT, NCS 01/13/18, 2:44 PM [phone]

## 2018-01-13 NOTE — Progress Notes (Signed)
Post HD assessment    01/13/18 1253  Neurological  Level of Consciousness Responds to Voice  Orientation Level Oriented to person  Respiratory  Respiratory Pattern Regular;Unlabored  Chest Assessment Chest expansion symmetrical  Cardiac  ECG Monitor Yes  Vascular  R Radial Pulse +1  L Radial Pulse +1  Edema Right upper extremity;Left upper extremity;Right lower extremity;Left lower extremity;Generalized  Musculoskeletal  Musculoskeletal (WDL) X  Generalized Weakness Yes  Assistive Device None  GU Assessment  Genitourinary (WDL) X  Genitourinary Symptoms  (HD)  Psychosocial  Psychosocial (WDL) X  Patient Behaviors Not interactive

## 2018-01-13 NOTE — Progress Notes (Signed)
HD tx end.    01/13/18 1244  Vital Signs  Pulse Rate 89  Pulse Rate Source Monitor  Resp 20  BP 107/68  BP Location Right Arm  BP Method Automatic  Patient Position (if appropriate) Lying  Oxygen Therapy  SpO2 100 %  O2 Device Nasal Cannula  O2 Flow Rate (L/min) 2 L/min  During Hemodialysis Assessment  Dialysis Fluid Bolus Normal Saline  Bolus Amount (mL) 250 mL  Intra-Hemodialysis Comments Tx completed

## 2018-01-13 NOTE — Progress Notes (Signed)
Pre HD assessment    01/13/18 0845  Vital Signs  Temp 98.7 F (37.1 C)  Temp Source Axillary  Pulse Rate 90  Pulse Rate Source Monitor  Resp 19  BP 113/66  BP Location Right Arm  BP Method Automatic  Patient Position (if appropriate) Lying  Oxygen Therapy  SpO2 100 %  O2 Device Nasal Cannula  O2 Flow Rate (L/min) 2 L/min  Pain Assessment  Pain Scale 0-10  Pain Score Asleep  Dialysis Weight  Weight 71.2 kg (156 lb 15.5 oz)  Type of Weight Pre-Dialysis  Time-Out for Hemodialysis  What Procedure? HD  Pt Identifiers(min of two) First/Last Name;MRN/Account#  Correct Site? Yes  Correct Side? Yes  Correct Procedure? Yes  Consents Verified? Yes  Rad Studies Available? N/A  Safety Precautions Reviewed? Yes  Engineer, civil (consulting) Number  ([id number])  Station Number 4  UF/Alarm Test Passed  Conductivity: Meter 13.8  Conductivity: Machine  14  pH 7.4  Reverse Osmosis main  Normal Saline Lot Number [id number]  Dialyzer Lot Number [id number]  Disposable Set Lot Number [id number]  Machine Temperature 98.6 F (37 C)  Musician and Audible Yes  Blood Lines Intact and Secured Yes  Pre Treatment Patient Checks  Vascular access used during treatment Catheter  Hepatitis B Surface Antigen Results Negative  Date Hepatitis B Surface Antigen Drawn 12/23/17  Hepatitis B Surface Antibody  (<10)  Date Hepatitis B Surface Antibody Drawn 12/09/17  Hemodialysis Consent Verified Yes  Hemodialysis Standing Orders Initiated Yes  ECG (Telemetry) Monitor On Yes  Prime Ordered Normal Saline  Length of  DialysisTreatment -hour(s) 3.5 Hour(s)  Dialyzer Elisio 17H NR  Dialysate 2K, 2.5 Ca  Dialysis Anticoagulant None  Dialysate Flow Ordered 800  Blood Flow Rate Ordered 400 mL/min  Ultrafiltration Goal 1.5 Liters  Pre Treatment Labs Phosphorus  Dialysis Blood Pressure Support Ordered Normal Saline  Education / Care Plan  Dialysis Education Provided Yes  Documented Education in Care Plan Yes

## 2018-01-13 NOTE — Progress Notes (Signed)
Palliative:  I met today at bedside of Mr. Brigitte Pulse. He is not very interactive today (even less than Friday). I spoke with [name] and her sisters joined conversation via telephone. We fully discussed how critically ill Mr. Kaigler is with continued infection, concern for continuing aspiration, severe weakness, and poor intake. I explained that although they remain hopeful we need to begin thinking and talking with each other as I am concerned that he may not break out of this cycle. One sister asked if I thought he was dying and I explained that I do believe he is approaching EOL and close to dying.   We reviewed heart and renal failure leading to complications and debility. Explained that they are likely facing very difficult decisions. [name] says that all she does know if that her father reversed his DNR "and he wants to live." I explained that most people want to live but we need to consider what we are doing to him to provide quantity and quality of life versus just doing things to him that we know will not be successful or beneficial to him.   They seem to understand the gravity of his illness at this time. [name] asks more questions regarding EOL and what dying from aspiration looks like and prognosis with aspiration. Bonnie's son joined [place] and explained the situation and poor prognosis. [name] says "we need a hail Sonda Rumble wants to remain hopeful but understands that improvement is unlikely. Emotional support provided.   No decisions made today but I feel family is much more understanding and more open to discussing poor outcome.   8 min  Vinie Sill, NP Palliative Medicine Team Pager # [phone] (M-F 8a-5p) Team Phone # [phone] (Nights/Weekends)

## 2018-01-13 NOTE — Progress Notes (Signed)
HD tx start    01/13/18 0900  Vital Signs  Pulse Rate 88  Pulse Rate Source Monitor  Resp 18  BP 107/61  BP Location Right Arm  BP Method Automatic  Patient Position (if appropriate) Lying  Oxygen Therapy  SpO2 100 %  O2 Device Room Air  During Hemodialysis Assessment  Blood Flow Rate (mL/min) 400 mL/min  Arterial Pressure (mmHg) -170 mmHg  Venous Pressure (mmHg) 110 mmHg  Transmembrane Pressure (mmHg) 50 mmHg  Ultrafiltration Rate (mL/min) 430 mL/min  Dialysate Flow Rate (mL/min) 800 ml/min  Conductivity: Machine  14  HD Safety Checks Performed Yes  Dialysis Fluid Bolus Normal Saline  Bolus Amount (mL) 250 mL  Intra-Hemodialysis Comments Tx initiated

## 2018-01-13 NOTE — Progress Notes (Signed)
Advanced care plan.  Purpose of the Encounter: CODE STATUS  Parties in Attendance:patients daughter  Patient's Decision Capacity: Confused  Subjective/Patient's story: Patient is 84 year old African-American male with end-stage renal disease admitted with pneumonia, patient's symptoms progressively worst he is now not eating or drinking much   Objective/Medical story I discussed with the daughter regarding CODE STATUS and how aggressive to be with his care gave her the option of comfort care   Goals of care determination: Full code and aggressive measures    CODE STATUS: Full code   Time spent discussing advanced care planning: 16 minutes

## 2018-01-13 NOTE — Progress Notes (Signed)
[facility]  ROUNDING NOTE   Subjective:   Seen and examined on hemodialysis. Hypotensive on treatment. Unable to tolerate much ultrafiltration.     HEMODIALYSIS FLOWSHEET:  Blood Flow Rate (mL/min): 400 mL/min Arterial Pressure (mmHg): -190 mmHg Venous Pressure (mmHg): 120 mmHg Transmembrane Pressure (mmHg): 50 mmHg Ultrafiltration Rate (mL/min): 290 mL/min Dialysate Flow Rate (mL/min): 800 ml/min Conductivity: Machine : 14 Conductivity: Machine : 14 Dialysis Fluid Bolus: Normal Saline Bolus Amount (mL): 250 mL   Objective:  Vital signs in last 24 hours:  Temp:  [97.5 F (36.4 C)-98.7 F (37.1 C)] 98.7 F (37.1 C) (06/03 0845) Pulse Rate:  [82-160] 85 (06/03 1115) Resp:  [15-21] 20 (06/03 1115) BP: (84-113)/(54-75) 101/57 (06/03 1115) SpO2:  [75 %-100 %] 100 % (06/03 1115) Weight:  [71.2 kg (156 lb 15.5 oz)] 71.2 kg (156 lb 15.5 oz) (06/03 0845)  Weight change:  Filed Weights   12/29/2017 0828 01/13/18 0845  Weight: 68 kg (150 lb) 71.2 kg (156 lb 15.5 oz)    Intake/Output: I/O last 3 completed shifts: In: 100 [IV Piggyback:100] Out: 0    Intake/Output this shift:  No intake/output data recorded.  Physical Exam: General: Lethargic, laying in bed  Head: Normocephalic, atraumatic. Moist oral mucosal membranes  Eyes: Anicteric,   Neck: Supple, trachea midline  Lungs:  Basilar rales   Heart: No rub S1S2  Abdomen:  Soft, nontender, BS present  Extremities: ++ peripheral edema.   Neurologic: lethargic  Skin: Warm, dry, no rash  Access:  Right internal jugular permcath    Basic Metabolic Panel: Recent Labs  Lab 01/02/2018 0935 12/14/2017 1759  NA 138  --   K 3.3*  --   CL 101  --   CO2 24  --   GLUCOSE 108*  --   BUN 31*  --   CREATININE 3.64*  --   CALCIUM 8.0*  --   MG 1.9  --   PHOS 2.8 3.0    Liver Function Tests: Recent Labs  Lab 12/24/2017 0935  AST 300*  ALT 201*  ALKPHOS 150*  BILITOT 3.5*  PROT 5.1*  ALBUMIN 2.2*   No  results for input(s): LIPASE, AMYLASE in the last 168 hours. No results for input(s): AMMONIA in the last 168 hours.  CBC: Recent Labs  Lab 12/14/2017 0838  WBC 19.8*  HGB 11.7*  HCT 36.9*  MCV 74.6*  PLT 272    Cardiac Enzymes: Recent Labs  Lab 12/16/2017 0935 12/30/2017 1245 01/03/2018 1759  TROPONINI 0.63* 0.57* 0.56*    BNP: Invalid input(s): POCBNP  CBG: No results for input(s): GLUCAP in the last 168 hours.  Microbiology: Results for orders placed or performed during the hospital encounter of 12/20/2017  Blood Culture (routine x 2)     Status: None (Preliminary result)   Collection Time: 12/16/2017 10:06 AM  Result Value Ref Range Status   Specimen Description BLOOD LEFT ARM  Final   Special Requests   Final    BOTTLES DRAWN AEROBIC AND ANAEROBIC Blood Culture results may not be optimal due to an inadequate volume of blood received in culture bottles   Culture   Final    NO GROWTH 2 DAYS Performed at [facility], [address], [place],  [address]    Report Status PENDING  Incomplete  Blood Culture (routine x 2)     Status: None (Preliminary result)   Collection Time: 12/14/2017 10:11 AM  Result Value Ref Range Status   Specimen Description  BLOOD LEFT ARM  Final   Special Requests   Final    BOTTLES DRAWN AEROBIC AND ANAEROBIC Blood Culture results may not be optimal due to an inadequate volume of blood received in culture bottles   Culture   Final    NO GROWTH 2 DAYS Performed at [facility], [address] [place], [place] [address]    Report Status PENDING  Incomplete  MRSA PCR Screening     Status: None   Collection Time: 12/26/2017  2:30 PM  Result Value Ref Range Status   MRSA by PCR NEGATIVE NEGATIVE Final    Comment:        The GeneXpert MRSA Assay (FDA approved for NASAL specimens only), is one component of a comprehensive MRSA colonization surveillance program. It is not intended to diagnose MRSA infection nor to guide  or monitor treatment for MRSA infections. Performed at [facility], [address] [place], [place] [address]     Coagulation Studies: No results for input(s): LABPROT, INR in the last 72 hours.  Urinalysis: No results for input(s): COLORURINE, LABSPEC, PHURINE, GLUCOSEU, HGBUR, BILIRUBINUR, KETONESUR, PROTEINUR, UROBILINOGEN, NITRITE, LEUKOCYTESUR in the last 72 hours.  Invalid input(s): APPERANCEUR    Imaging: No results found.   Medications:   . albumin human    . ceFEPime (MAXIPIME) IV Stopped (01/12/18 1019)   . amiodarone  200 mg Oral Daily  . aspirin EC  81 mg Oral Daily  . atorvastatin  40 mg Oral q1800  . [START ON 01/14/2018] colchicine  0.3 mg Oral Once per day on Mon Thu  . collagenase   Topical Daily  . feeding supplement (NEPRO CARB STEADY)  237 mL Oral TID BM  . finasteride  5 mg Oral Daily  . fluconazole  100 mg Oral Daily  . fluticasone  2 spray Each Nare Daily  . lidocaine  15 mL Mouth/Throat Q6H  . magic mouthwash  15 mL Oral QID  . megestrol  200 mg Oral QHS  . midodrine  20 mg Oral TID WC  . mirtazapine  7.5 mg Oral QHS  . multivitamin  1 tablet Oral QHS  . pantoprazole  40 mg Oral Daily  . traMADol  50 mg Oral Q8H   benzonatate, HYDROcodone-acetaminophen, senna-docusate, sodium chloride  Assessment/ Plan:  [name] is a 84 y.o. black male with hypertension, hyperlipidemia, gout, ? history of prostate cancer, coronary artery disease, who was admitted to [facility] on 12/01/2017 for heart failure.  Admitted 12/13/2017 for hypoxia, AMS likely due to aspiration.    [facility] MWF RIJ permcath  1. Acute renal failure with chronic kidney disease stage III with bland urinalysis. Baseline creatinine of 2.1, GFR of 37 on 10/15/17.  Acute renal failure secondary to acute cardiorenal syndrome, ATN from hypotensive event and then with overdiuresis Suspect patient patient has progressed to ESRD.  Seen and examined on hemodialysis. Hypotensive.  With volume overload.  Evaluate daily for dialysis need.  IV albumin with treatment.   2.  Right lower lobe pneumonia. With leukocytosis -  cefepime.  3. Hypotension: with diastolic congestive heart failure. With anasarca on examination.  - midodrine   4.  Anemia of chronic kidney disease: hemoglobin 11.7. No epo at this time.   5. Secondary Hyperparathyroidism: not currently on binders.    LOS: 0 Andrew [name] 6/3/201911:29 AM

## 2018-01-13 NOTE — Progress Notes (Signed)
Pt's tray removed from pt's room by RN because pt has been sleeping all day and hard to wake up. Discuss with speech therapy and she said is not safe to put food in pts mouth at this points. Also medications are not given bc every time RN tries to put meds in pt's mouth he keeps them in his mouth and do not swallow them. Earlier RN had to suctions pills from pt's because he would not swallow them.

## 2018-01-14 DIAGNOSIS — J189 Pneumonia, unspecified organism: Secondary | ICD-10-CM

## 2018-01-14 DIAGNOSIS — Z7189 Other specified counseling: Secondary | ICD-10-CM

## 2018-01-14 DIAGNOSIS — Z515 Encounter for palliative care: Secondary | ICD-10-CM

## 2018-01-14 DIAGNOSIS — R1310 Dysphagia, unspecified: Secondary | ICD-10-CM

## 2018-01-14 DIAGNOSIS — R627 Adult failure to thrive: Secondary | ICD-10-CM

## 2018-01-14 LAB — RENAL FUNCTION PANEL
ALBUMIN: 1.9 g/dL — AB (ref 3.5–5.0)
Anion gap: 12 (ref 5–15)
BUN: 25 mg/dL — ABNORMAL HIGH (ref 6–20)
CALCIUM: 7.7 mg/dL — AB (ref 8.9–10.3)
CO2: 27 mmol/L (ref 22–32)
CREATININE: 3.05 mg/dL — AB (ref 0.61–1.24)
Chloride: 102 mmol/L (ref 101–111)
GFR calc Af Amer: 20 mL/min — ABNORMAL LOW (ref >60)
GFR, EST NON AFRICAN AMERICAN: 18 mL/min — AB (ref >60)
Glucose, Bld: 37 mg/dL — CL (ref 65–99)
PHOSPHORUS: 3 mg/dL (ref 2.5–4.6)
Potassium: 3.5 mmol/L (ref 3.5–5.1)
SODIUM: 141 mmol/L (ref 135–145)

## 2018-01-14 LAB — CBC WITH DIFFERENTIAL/PLATELET
BASOS ABS: 0 10*3/uL (ref 0–0.1)
BASOS PCT: 0 %
EOS ABS: 0.1 10*3/uL (ref 0–0.7)
Eosinophils Relative: 1 %
HCT: 27.2 % — ABNORMAL LOW (ref 40.0–52.0)
Hemoglobin: 8.8 g/dL — ABNORMAL LOW (ref 13.0–18.0)
Lymphocytes Relative: 8 %
Lymphs Abs: 1.2 10*3/uL (ref 1.0–3.6)
MCH: 24.5 pg — AB (ref 26.0–34.0)
MCHC: 32.3 g/dL (ref 32.0–36.0)
MCV: 76.1 fL — ABNORMAL LOW (ref 80.0–100.0)
Monocytes Absolute: 0.9 10*3/uL (ref 0.2–1.0)
Monocytes Relative: 6 %
NEUTROS PCT: 85 %
Neutro Abs: 13.4 10*3/uL — ABNORMAL HIGH (ref 1.4–6.5)
PLATELETS: 165 10*3/uL (ref 150–440)
RBC: 3.57 MIL/uL — ABNORMAL LOW (ref 4.40–5.90)
RDW: 36.9 % — ABNORMAL HIGH (ref 11.5–14.5)
WBC: 15.6 10*3/uL — AB (ref 3.8–10.6)

## 2018-01-14 LAB — GLUCOSE, CAPILLARY
Glucose-Capillary: 173 mg/dL — ABNORMAL HIGH (ref 65–99)
Glucose-Capillary: 28 mg/dL — CL (ref 65–99)

## 2018-01-14 LAB — PROCALCITONIN: Procalcitonin: 3.29 ng/mL

## 2018-01-14 MED ORDER — HEPARIN SODIUM (PORCINE) 5000 UNIT/ML IJ SOLN
5000.0000 [IU] | Freq: Two times a day (BID) | INTRAMUSCULAR | Status: DC
  Administered 2018-01-14 – 2018-01-16 (×4): 5000 [IU] via SUBCUTANEOUS
  Filled 2018-01-14 (×4): qty 1

## 2018-01-14 MED ORDER — ACETAMINOPHEN 650 MG RE SUPP
650.0000 mg | Freq: Four times a day (QID) | RECTAL | Status: DC | PRN
  Administered 2018-01-14 – 2018-01-15 (×3): 650 mg via RECTAL
  Filled 2018-01-14 (×3): qty 1

## 2018-01-14 MED ORDER — DEXTROSE 50 % IV SOLN
INTRAVENOUS | Status: AC
  Administered 2018-01-14: 50 mL
  Filled 2018-01-14: qty 50

## 2018-01-14 MED ORDER — CHLORHEXIDINE GLUCONATE CLOTH 2 % EX PADS
6.0000 | MEDICATED_PAD | Freq: Every day | CUTANEOUS | Status: DC
  Administered 2018-01-15 – 2018-01-17 (×2): 6 via TOPICAL

## 2018-01-14 MED ORDER — DEXTROSE 50 % IV SOLN
1.0000 | Freq: Once | INTRAVENOUS | Status: AC
  Administered 2018-01-14: 50 mL via INTRAVENOUS

## 2018-01-14 NOTE — Progress Notes (Signed)
[place] at [facility]                                                                                                                                                                                  Patient Demographics   Andrew [name], is a 84 y.o. male, DOB - 1933/06/21, BMW:[id number]  Admit date - 12/20/2017   Admitting Physician Loletha Grayer, MD  Outpatient Primary MD for the patient is [name], MD   LOS - 1  Subjective: Patient now more lethargic  Review of Systems:   CONSTITUTIONAL: Unable to provide due to worsening condition  Vitals:   Vitals:   01/13/18 2019 01/14/18 0358 01/14/18 1236 01/14/18 1238  BP: (!) 87/51 (!) 93/53 (!) 85/45 97/60  Pulse: 92 89 89 90  Resp:  16 20   Temp:  98.5 F (36.9 C)    TempSrc:  Oral    SpO2: 98% 99% 98% 98%  Weight:      Height:        Wt Readings from Last 3 Encounters:  01/13/18 70.8 kg (156 lb 1.4 oz)  12/26/17 73.5 kg (162 lb)  12/21/17 72.4 kg (159 lb 11.2 oz)     Intake/Output Summary (Last 24 hours) at 01/14/2018 1454 Last data filed at 01/14/2018 1422 Gross per 24 hour  Intake 340 ml  Output -  Net 340 ml    Physical Exam:   GENERAL: Chronically ill-appearing HEAD, EYES, EARS, NOSE AND THROAT: Atraumatic, normocephalic. Extraocular muscles are intact. Pupils equal and reactive to light. Sclerae anicteric. No conjunctival injection. No oro-pharyngeal erythema.  NECK: Supple. There is no jugular venous distention. No bruits, no lymphadenopathy, no thyromegaly.  HEART: Regular rate and rhythm,. No murmurs, no rubs, no clicks.  LUNGS: Rhonchus breath sounds bilaterally ABDOMEN: Soft, flat, nontender, nondistended. Has good bowel sounds. No hepatosplenomegaly appreciated.  EXTREMITIES: No evidence of any cyanosis, clubbing, or 2+ peripheral edema.  +2 pedal and radial pulses bilaterally.  NEUROLOGIC: Drowsy  sKIN: Moist and warm with no rashes appreciated.  Psych: Drowsy LN: No  inguinal LN enlargement    Antibiotics   Anti-infectives (From admission, onward)   Start     Dose/Rate Route Frequency Ordered Stop   01/13/18 1700  Ampicillin-Sulbactam (UNASYN) 3 g in sodium chloride 0.9 % 100 mL IVPB     3 g 200 mL/hr over 30 Minutes Intravenous Every 12 hours 01/13/18 1450     01/11/18 1000  ceFEPIme (MAXIPIME) 1 g in sodium chloride 0.9 % 100 mL IVPB  Status:  Discontinued     1 g 200 mL/hr over 30 Minutes Intravenous Every 24 hours 12/20/2017 1535 01/13/18 1443  01/05/2018 1700  vancomycin (VANCOCIN) 500 mg in sodium chloride 0.9 % 100 mL IVPB     500 mg 100 mL/hr over 60 Minutes Intravenous  Once 12/20/2017 1536 12/16/2017 1708   12/11/2017 1115  fluconazole (DIFLUCAN) tablet 100 mg  Status:  Discontinued     100 mg Oral Daily 12/17/2017 1112 01/14/18 1333   12/18/2017 1015  ceFEPIme (MAXIPIME) 2 g in sodium chloride 0.9 % 100 mL IVPB     2 g 200 mL/hr over 30 Minutes Intravenous  Once 12/30/2017 1005 12/21/2017 1127   01/04/2018 1015  vancomycin (VANCOCIN) IVPB 1000 mg/200 mL premix     1,000 mg 200 mL/hr over 60 Minutes Intravenous  Once 12/21/2017 1005 01/05/2018 1227      Medications   Scheduled Meds: . amiodarone  200 mg Oral Daily  . aspirin EC  81 mg Oral Daily  . atorvastatin  40 mg Oral q1800  . collagenase   Topical Daily  . feeding supplement (NEPRO CARB STEADY)  237 mL Oral TID BM  . finasteride  5 mg Oral Daily  . fluticasone  2 spray Each Nare Daily  . heparin injection (subcutaneous)  5,000 Units Subcutaneous Q12H  . lidocaine  15 mL Mouth/Throat Q6H  . magic mouthwash  15 mL Oral QID  . megestrol  200 mg Oral QHS  . midodrine  20 mg Oral TID WC  . mirtazapine  7.5 mg Oral QHS  . multivitamin  1 tablet Oral QHS  . pantoprazole  40 mg Oral Daily   Continuous Infusions: . albumin human    . ampicillin-sulbactam (UNASYN) IV Stopped (01/14/18 0557)   PRN Meds:.acetaminophen, benzonatate, senna-docusate, sodium chloride   Data Review:   Micro  Results Recent Results (from the past 240 hour(s))  Blood Culture (routine x 2)     Status: None (Preliminary result)   Collection Time: 01/07/2018 10:06 AM  Result Value Ref Range Status   Specimen Description BLOOD LEFT ARM  Final   Special Requests   Final    BOTTLES DRAWN AEROBIC AND ANAEROBIC Blood Culture results may not be optimal due to an inadequate volume of blood received in culture bottles   Culture   Final    NO GROWTH 4 DAYS Performed at [facility], [address] [place], [place] [address]    Report Status PENDING  Incomplete  Blood Culture (routine x 2)     Status: None (Preliminary result)   Collection Time: 01/06/2018 10:11 AM  Result Value Ref Range Status   Specimen Description BLOOD LEFT ARM  Final   Special Requests   Final    BOTTLES DRAWN AEROBIC AND ANAEROBIC Blood Culture results may not be optimal due to an inadequate volume of blood received in culture bottles   Culture   Final    NO GROWTH 4 DAYS Performed at [facility], [address] [place], [place] [address]    Report Status PENDING  Incomplete  MRSA PCR Screening     Status: None   Collection Time: 12/29/2017  2:30 PM  Result Value Ref Range Status   MRSA by PCR NEGATIVE NEGATIVE Final    Comment:        The GeneXpert MRSA Assay (FDA approved for NASAL specimens only), is one component of a comprehensive MRSA colonization surveillance program. It is not intended to diagnose MRSA infection nor to guide or monitor treatment for MRSA infections. Performed at [facility], [address] [place], [place] [address]  Radiology Reports Dg Chest 2 View  Result Date: 12/26/2017 CLINICAL DATA:  Shortness of breath and weakness for several hours EXAM: CHEST - 2 VIEW COMPARISON:  12/20/2017 FINDINGS: Dialysis catheter is again identified. Stable cardiomegaly is seen. Small pleural effusions and bilateral lower lobe infiltrate/atelectasis is noted slightly greater on  the right than the left. The overall appearance is similar to that seen on the prior exam. No new bony abnormality is seen. IMPRESSION: Overall stable appearance of the chest with bilateral pleural effusions and lower lobe infiltrate/atelectasis. Electronically Signed   By: Inez Catalina M.D.   On: 12/26/2017 16:21   Dg Chest 2 View  Result Date: 12/20/2017 CLINICAL DATA:  Chest tightness with mild dyspnea. EXAM: CHEST - 2 VIEW COMPARISON:  12/01/2017 FINDINGS: Stable cardiomegaly with moderate aortic atherosclerosis. New bilateral small pleural effusions obscuring the diaphragms and spanning approximately of 1-1/2 to 2 vertebral body heights on the lateral view. New right IJ dialysis catheter tip terminates in the distal SVC. IMPRESSION: Cardiomegaly with new small bilateral pleural effusions. No overt pulmonary edema. New right IJ dialysis catheter is noted without apparent complication. Electronically Signed   By: Ashley Royalty M.D.   On: 12/20/2017 00:53   Mr Brain Wo Contrast  Result Date: 12/12/2017 CLINICAL DATA:  84 year old male dialysis patient with altered mental status, weakness, and slurred speech. EXAM: MRI HEAD WITHOUT CONTRAST TECHNIQUE: Multiplanar, multiecho pulse sequences of the brain and surrounding structures were obtained without intravenous contrast. COMPARISON:  None. FINDINGS: Brain: Generalized cerebral volume loss with ex vacuo appearing ventricular enlargement. No restricted diffusion to suggest acute infarction. No midline shift, mass effect, evidence of mass lesion, extra-axial collection or acute intracranial hemorrhage. Cervicomedullary junction and pituitary are within normal limits. Pearline Cables and white matter signal is within normal limits for age throughout the brain. No cortical encephalomalacia or definite chronic cerebral blood products. Signal in the deep gray matter nuclei, brainstem, and cerebellum is normal for age. Vascular: Major intracranial vascular flow voids are  preserved. Skull and upper cervical spine: Negative for age visible cervical spine. Normal bone marrow signal. Sinuses/Orbits: Normal orbits soft tissues. Paranasal sinuses and mastoids are well pneumatized. Other: Visible internal auditory structures appear normal. Scalp and face soft tissues appear negative. IMPRESSION: No acute intracranial abnormality. Negative for age noncontrast MRI appearance of the brain. Electronically Signed   By: Genevie Ann M.D.   On: 01/07/2018 14:27   Dg Chest Port 1 View  Result Date: 01/13/2018 CLINICAL DATA:  Pneumonia. History of prostate cancer, hypertension and mitral valve disorder. EXAM: PORTABLE CHEST 1 VIEW COMPARISON:  Radiographs 12/28/2017 and 12/26/2017. FINDINGS: 1452 hours. Right IJ hemodialysis catheters are unchanged near the superior cavoatrial junction. The heart size and mediastinal contours are stable. There are increased pleural effusions and right-greater-than-left basilar airspace opacities. There is probable mild edema. No pneumothorax. The bones appear unchanged. IMPRESSION: Worsening pleural effusions, bibasilar pulmonary opacities and probable edema. Electronically Signed   By: Richardean Sale M.D.   On: 01/13/2018 15:15   Dg Chest Port 1 View  Result Date: 01/02/2018 CLINICAL DATA:  Syncope. EXAM: PORTABLE CHEST 1 VIEW COMPARISON:  12/26/2017. FINDINGS: Dual-lumen catheter with tip over superior vena cava. Cardiomegaly. Progressive right lower lobe infiltrate. Right pleural effusion again noted. No pneumothorax. IMPRESSION: 1.  Dual-lumen catheter with tip over superior vena cava. 2. Progressive right lower lobe infiltrate. Right pleural effusion again noted. Electronically Signed   By: Mettler   On: 12/29/2017 09:04     CBC  Recent Labs  Lab 12/22/2017 0838 01/13/18 1011 01/14/18 0609  WBC 19.8* 16.9* 15.6*  HGB 11.7* 9.5* 8.8*  HCT 36.9* 30.0* 27.2*  PLT 272 211 165  MCV 74.6* 76.0* 76.1*  MCH 23.6* 24.1* 24.5*  MCHC 31.7* 31.7*  32.3  RDW 24.7* 37.0* 36.9*  LYMPHSABS  --  0.7* 1.2  MONOABS  --  0.8 0.9  EOSABS  --  0.0 0.1  BASOSABS  --  0.0 0.0    Chemistries  Recent Labs  Lab 12/20/2017 0935 01/13/18 1719 01/14/18 0609  NA 138  --  141  K 3.3*  --  3.5  CL 101  --  102  CO2 24  --  27  GLUCOSE 108*  --  37*  BUN 31*  --  25*  CREATININE 3.64*  --  3.05*  CALCIUM 8.0*  --  7.7*  MG 1.9  --   --   AST 300* 186*  --   ALT 201* 152*  --   ALKPHOS 150* 102  --   BILITOT 3.5* 3.4*  --    ------------------------------------------------------------------------------------------------------------------ estimated creatinine clearance is 18.7 mL/min (A) (by C-G formula based on SCr of 3.05 mg/dL (H)). ------------------------------------------------------------------------------------------------------------------ No results for input(s): HGBA1C in the last 72 hours. ------------------------------------------------------------------------------------------------------------------ No results for input(s): CHOL, HDL, LDLCALC, TRIG, CHOLHDL, LDLDIRECT in the last 72 hours. ------------------------------------------------------------------------------------------------------------------ No results for input(s): TSH, T4TOTAL, T3FREE, THYROIDAB in the last 72 hours.  Invalid input(s): FREET3 ------------------------------------------------------------------------------------------------------------------ No results for input(s): VITAMINB12, FOLATE, FERRITIN, TIBC, IRON, RETICCTPCT in the last 72 hours.  Coagulation profile Recent Labs  Lab 01/13/18 1719  INR 1.19    No results for input(s): DDIMER in the last 72 hours.  Cardiac Enzymes Recent Labs  Lab 12/11/2017 0935 12/20/2017 1245 01/08/2018 1759  TROPONINI 0.63* 0.57* 0.56*   ------------------------------------------------------------------------------------------------------------------ Invalid input(s): POCBNP    Assessment & Plan   1.     Acute encephalopathy due to pneumonia : Patient currently worse MRI negative for stroke 2.  Healthcare associated pneumonia, leukocytosis.    Continue Unasyn leukocytosis improved 3.    Acute on chronic chronic hypoxic respiratory failure on oxygen.  4.  Chronic hypotension on midodrine continue midodrine 5.  History of congestive heart failure with mid range ejection fraction.  Dialysis to manage fluid. 6.  End-stage renal disease.    Dialysis per nephrology 7.  History of gout 8.  Anemia of chronic disease 9.  Paroxysmal atrial fibrillation on amiodarone and aspirin 10.  Weakness physical therapy evaluation   Prognosis continues to be very poor awaiting palliative care input   Code Status History    Date Active Date Inactive Code Status Order ID Comments User Context   12/26/2017 2019 12/29/2017 1914 Full Code [id number]  Demetrios Loll, MD Inpatient   12/20/2017 0645 12/21/2017 1922 Full Code [id number]  Arta Silence, MD Inpatient   12/20/2017 0308 12/20/2017 0645 DNR [id number]  Amelia Jo, MD ED   12/02/2017 1128 12/17/2017 1611 DNR [id number]  Max Sane, MD Inpatient   12/01/2017 1522 12/02/2017 1128 Full Code [id number]  Demetrios Loll, MD Inpatient   10/30/2017 2256 11/02/2017 1709 Full Code [id number]  Lance Coon, MD Inpatient   07/26/2017 1728 07/28/2017 1620 Full Code [id number]  Saundra Shelling, MD Inpatient   04/08/2017 1555 04/10/2017 1526 Full Code [id number]  Dustin Flock, MD Inpatient           Consults  nephrolgy  DVT Prophylaxis  heparin  Lab Results  Component Value Date  PLT 165 01/14/2018     Time Spent in minutes  45min Greater than 50% of time spent in care coordination and counseling patient regarding the condition and plan of care.   Dustin Flock M.D on 01/14/2018 at 2:54 PM  Between 7am to 6pm - Pager - [phone]  After 6pm go to [email or web address] - Proofreader  [employer]   Office  [phone]

## 2018-01-14 NOTE — Progress Notes (Signed)
CRITICAL VALUE ALERT  Critical Value:  Glucose 38 by lab  Date & Time Notied:  6/4 @0655    Provider Notified: Marcille Blanco MD    Orders Received/Actions taken: one amp d50 given. Recheck glucose 173

## 2018-01-14 NOTE — Progress Notes (Signed)
[facility]  ROUNDING NOTE   Subjective:   Daughter at bedside.   More awake this morning   Hemodialysis treatment yesterday. Unable to tolerate ultrafiltration   Objective:  Vital signs in last 24 hours:  Temp:  [98.5 F (36.9 C)-99.1 F (37.3 C)] 98.5 F (36.9 C) (06/04 0358) Pulse Rate:  [89-92] 90 (06/04 1238) Resp:  [16-24] 20 (06/04 1236) BP: (74-97)/(45-60) 97/60 (06/04 1238) SpO2:  [97 %-99 %] 98 % (06/04 1238)  Weight change:  Filed Weights   12/14/2017 0828 01/13/18 0845 01/13/18 1251  Weight: 68 kg (150 lb) 71.2 kg (156 lb 15.5 oz) 70.8 kg (156 lb 1.4 oz)    Intake/Output: I/O last 3 completed shifts: In: 100 [IV Piggyback:100] Out: 369 [Other:369]   Intake/Output this shift:  No intake/output data recorded.  Physical Exam: General: Lethargic, laying in bed  Head: Normocephalic, atraumatic. Moist oral mucosal membranes  Eyes: Anicteric,   Neck: Supple, trachea midline  Lungs:  clear  Heart: No rub S1S2  Abdomen:  Soft, nontender, BS present  Extremities: + peripheral edema.   Neurologic: lethargic  Skin: Warm, dry, no rash  Access:  Right internal jugular permcath    Basic Metabolic Panel: Recent Labs  Lab 01/09/2018 0935 12/12/2017 1759 01/14/18 0609  NA 138  --  141  K 3.3*  --  3.5  CL 101  --  102  CO2 24  --  27  GLUCOSE 108*  --  37*  BUN 31*  --  25*  CREATININE 3.64*  --  3.05*  CALCIUM 8.0*  --  7.7*  MG 1.9  --   --   PHOS 2.8 3.0 3.0    Liver Function Tests: Recent Labs  Lab 01/07/2018 0935 01/13/18 1719 01/14/18 0609  AST 300* 186*  --   ALT 201* 152*  --   ALKPHOS 150* 102  --   BILITOT 3.5* 3.4*  --   PROT 5.1* 4.7*  --   ALBUMIN 2.2* 2.2* 1.9*   No results for input(s): LIPASE, AMYLASE in the last 168 hours. No results for input(s): AMMONIA in the last 168 hours.  CBC: Recent Labs  Lab 12/12/2017 0838 01/13/18 1011 01/14/18 0609  WBC 19.8* 16.9* 15.6*  NEUTROABS  --  15.4* 13.4*  HGB 11.7* 9.5*  8.8*  HCT 36.9* 30.0* 27.2*  MCV 74.6* 76.0* 76.1*  PLT 272 211 165    Cardiac Enzymes: Recent Labs  Lab 12/15/2017 0935 01/03/2018 1245 01/01/2018 1759  TROPONINI 0.63* 0.57* 0.56*    BNP: Invalid input(s): POCBNP  CBG: Recent Labs  Lab 01/14/18 0651 01/14/18 0657  GLUCAP 28* 173*    Microbiology: Results for orders placed or performed during the hospital encounter of 12/30/2017  Blood Culture (routine x 2)     Status: None (Preliminary result)   Collection Time: 01/09/2018 10:06 AM  Result Value Ref Range Status   Specimen Description BLOOD LEFT ARM  Final   Special Requests   Final    BOTTLES DRAWN AEROBIC AND ANAEROBIC Blood Culture results may not be optimal due to an inadequate volume of blood received in culture bottles   Culture   Final    NO GROWTH 4 DAYS Performed at [facility], [address], [place], [place] [address]    Report Status PENDING  Incomplete  Blood Culture (routine x 2)     Status: None (Preliminary result)   Collection Time: 12/27/2017 10:11 AM  Result Value Ref Range Status  Specimen Description BLOOD LEFT ARM  Final   Special Requests   Final    BOTTLES DRAWN AEROBIC AND ANAEROBIC Blood Culture results may not be optimal due to an inadequate volume of blood received in culture bottles   Culture   Final    NO GROWTH 4 DAYS Performed at [facility], [address], [place], [place] [address]    Report Status PENDING  Incomplete  MRSA PCR Screening     Status: None   Collection Time: 12/29/2017  2:30 PM  Result Value Ref Range Status   MRSA by PCR NEGATIVE NEGATIVE Final    Comment:        The GeneXpert MRSA Assay (FDA approved for NASAL specimens only), is one component of a comprehensive MRSA colonization surveillance program. It is not intended to diagnose MRSA infection nor to guide or monitor treatment for MRSA infections. Performed at [facility], [address] [place], [place] [address]      Coagulation Studies: Recent Labs    01/13/18 1719  LABPROT 15.0  INR 1.19    Urinalysis: No results for input(s): COLORURINE, LABSPEC, PHURINE, GLUCOSEU, HGBUR, BILIRUBINUR, KETONESUR, PROTEINUR, UROBILINOGEN, NITRITE, LEUKOCYTESUR in the last 72 hours.  Invalid input(s): APPERANCEUR    Imaging: Dg Chest Port 1 View  Result Date: 01/13/2018 CLINICAL DATA:  Pneumonia. History of prostate cancer, hypertension and mitral valve disorder. EXAM: PORTABLE CHEST 1 VIEW COMPARISON:  Radiographs 01/02/2018 and 12/26/2017. FINDINGS: 1452 hours. Right IJ hemodialysis catheters are unchanged near the superior cavoatrial junction. The heart size and mediastinal contours are stable. There are increased pleural effusions and right-greater-than-left basilar airspace opacities. There is probable mild edema. No pneumothorax. The bones appear unchanged. IMPRESSION: Worsening pleural effusions, bibasilar pulmonary opacities and probable edema. Electronically Signed   By: Richardean Sale M.D.   On: 01/13/2018 15:15     Medications:   . albumin human    . ampicillin-sulbactam (UNASYN) IV Stopped (01/14/18 0557)   . amiodarone  200 mg Oral Daily  . aspirin EC  81 mg Oral Daily  . atorvastatin  40 mg Oral q1800  . collagenase   Topical Daily  . feeding supplement (NEPRO CARB STEADY)  237 mL Oral TID BM  . finasteride  5 mg Oral Daily  . fluticasone  2 spray Each Nare Daily  . heparin injection (subcutaneous)  5,000 Units Subcutaneous Q12H  . lidocaine  15 mL Mouth/Throat Q6H  . magic mouthwash  15 mL Oral QID  . megestrol  200 mg Oral QHS  . midodrine  20 mg Oral TID WC  . mirtazapine  7.5 mg Oral QHS  . multivitamin  1 tablet Oral QHS  . pantoprazole  40 mg Oral Daily   acetaminophen, benzonatate, senna-docusate, sodium chloride  Assessment/ Plan:  Mr. GOTHAM RADEN is a 84 y.o. black male with hypertension, hyperlipidemia, gout, ? history of prostate cancer, coronary artery disease, who  was admitted to [facility] on 12/01/2017 for heart failure.  Admitted 12/27/2017 for hypoxia, AMS likely due to aspiration.    [facility] MWF RIJ permcath  1. Acute renal failure with chronic kidney disease stage III with bland urinalysis. Baseline creatinine of 2.1, GFR of 37 on 10/15/17.  Acute renal failure secondary to acute cardiorenal syndrome, ATN from hypotensive event and then with overdiuresis Suspect patient patient has progressed to ESRD.  Hypotensive and with volume overload.  Evaluate daily for dialysis need.  IV albumin with treatment.  Next treatment for tomorrow. If unable  to get ultrafiltration: may consider moving to ICU and giving vasopressors.   2.  Right lower lobe pneumonia. With leukocytosis -  Unasyn.  3. Hypotension: with diastolic congestive heart failure. With anasarca on examination.  - midodrine   4.  Anemia of chronic kidney disease: hemoglobin 11.7. No epo at this time.   5. Secondary Hyperparathyroidism: not currently on binders.    LOS: 1 Djuna Frechette 6/4/20192:06 PM

## 2018-01-14 NOTE — Clinical Social Work Note (Signed)
Patient's daughter is wanting to pursue aggressive care and is still believing patient will improve.   Shela Leff MSW,LCSW [phone]

## 2018-01-14 NOTE — Progress Notes (Signed)
PT Cancellation Note  Patient Details Name: Andrew [name] MRN: [id number] DOB: 09-30-1932   Cancelled Treatment:    Reason Eval/Treat Not Completed: Medical issues which prohibited therapy;Patient's level of consciousness    Chart reviewed.  Family in hallway and declined attempts at therapy today.  Will continue as appropriate.   Chesley Noon 01/14/2018, 10:51 AM

## 2018-01-14 NOTE — Progress Notes (Signed)
Daily Progress Note   Patient Name: Andrew [name]       Date: 01/14/2018 DOB: May 16, 1933  Age: 84 y.o. MRN#: [id number] Attending Physician: Andrew Flock, MD Primary Care Physician: Andrew Pink, MD Admit Date: 12/29/2017  Reason for Consultation/Follow-up: Establishing goals of care  Subjective: Patient opens eyes to my voice, shakes head no when asked if he is in pain but also shakes head no when asked if he is comfortable. When asked what is uncomfortable he does not respond. Daughter, Andrew [name], and grandson, Andrew [name], at bedside.   Length of Stay: 1  Current Medications: Scheduled Meds:  . amiodarone  200 mg Oral Daily  . aspirin EC  81 mg Oral Daily  . atorvastatin  40 mg Oral q1800  . collagenase   Topical Daily  . feeding supplement (NEPRO CARB STEADY)  237 mL Oral TID BM  . finasteride  5 mg Oral Daily  . fluticasone  2 spray Each Nare Daily  . heparin injection (subcutaneous)  5,000 Units Subcutaneous Q12H  . lidocaine  15 mL Mouth/Throat Q6H  . magic mouthwash  15 mL Oral QID  . megestrol  200 mg Oral QHS  . midodrine  20 mg Oral TID WC  . mirtazapine  7.5 mg Oral QHS  . multivitamin  1 tablet Oral QHS  . pantoprazole  40 mg Oral Daily    Continuous Infusions: . albumin human    . ampicillin-sulbactam (UNASYN) IV Stopped (01/14/18 0557)    PRN Meds: acetaminophen, benzonatate, senna-docusate, sodium chloride  Physical Exam  Constitutional: He appears lethargic. He appears cachectic. He has a sickly appearance. No distress.  HENT:  Head: Normocephalic and atraumatic.  Cardiovascular: Normal rate and regular rhythm.  Pulmonary/Chest: Effort normal. No accessory muscle usage. No tachypnea. No respiratory distress. He has rhonchi.  Abdominal: Soft.  Musculoskeletal:   Right lower leg: He exhibits edema.       Left lower leg: He exhibits edema.  Neurological: He appears lethargic.  Does not answer orientation questions, weak  Skin: Skin is warm and dry.  Nursing note and vitals reviewed.           Vital Signs: BP 97/60 (BP Location: Left Arm)   Pulse 90   Temp 98.5 F (36.9 C) (Oral)   Resp 20   Ht _0  (1.753 m)   Wt 70.8 kg (156 lb 1.4 oz)   SpO2 98%   BMI 23.05 kg/m  SpO2: SpO2: 98 % O2 Device: O2 Device: Nasal Cannula O2 Flow Rate: O2 Flow Rate (L/min): 2 L/min  Intake/output summary:   Intake/Output Summary (Last 24 hours) at 01/14/2018 1427 Last data filed at 01/14/2018 1422 Gross per 24 hour  Intake 340 ml  Output -  Net 340 ml   LBM: Last BM Date: 01/14/18 Baseline Weight: Weight: 68 kg (150 lb) Most recent weight: Weight: 70.8 kg (156 lb 1.4 oz)       Palliative Assessment/Data: PPS 20%      Patient Active Problem List   Diagnosis Date Noted  . PNA (pneumonia) 01/13/2018  . Pneumonia 01/01/2018  . Protein-calorie malnutrition, severe 01/05/2018  . Physical deconditioning   . CHF (congestive heart failure) ([place]) 12/27/2017  .  Fluid overload 12/26/2017  . NSTEMI (non-ST elevated myocardial infarction) ([place]) 12/20/2017  . ESRD (end stage renal disease) ([place]) 12/19/2017  . Atrial fibrillation ([place]) 12/19/2017  . Pressure injury of skin 12/02/2017  . Renal failure (ARF), acute on chronic (HCC) 12/01/2017  . Unstable angina ([place]) 10/31/2017  . Elevated troponin 10/30/2017  . Chronic systolic CHF (congestive heart failure) ([place]) 10/30/2017  . Acute posthemorrhagic anemia   . GERD (gastroesophageal reflux disease)   . PAD (peripheral artery disease) ([place]) 06/04/2016  . Colon polyp 06/29/2015  . Malignant neoplasm of prostate ([place]) 11/14/2014  . HLD (hyperlipidemia) 07/29/2014  . Long term current use of antithrombotics/antiplatelets 01/27/2014  . Arteriosclerosis of coronary artery 01/27/2014  . Carpal tunnel syndrome  12/11/2013  . Benign essential HTN 12/11/2013  . Lesion of ulnar nerve 12/11/2013  . Disorder of mitral valve 12/11/2013  . Arthritis, degenerative 12/11/2013  . Hereditary and idiopathic neuropathy 12/11/2013    Palliative Care Assessment & Plan   HPI: 84 y.o. male  with past medical history of ESRD HD MWF beginning ~1.5 mo ago, hypotension requiring midodrine, atrial fibrillation, CAD, mitral valve regurgitation, combined systolic and grade II diastolic heart failure EF 45-50%, lung nodule, prostate cancer admitted on 12/27/2017 with loss of consciousness during dialysis, intermittent N/V, belching, speech changes x 4 days. Admitted to r/o stroke, treating for HCAP, and continuing dialysis. PMT consulted for [place].  Assessment: I have reviewed medical records including EPIC notes, labs and imaging, received report from bedside RN, assessed the patient and then met at the bedside along with patient's daughter, Andrew [name] and grandson, Andrew [name],  to discuss diagnosis prognosis, GOC, EOL wishes, disposition and options.  Called by RN earlier d/t patient screaming out in pain. RN administered rectal tylenol. Apparently other pain medications d/c'd earlier d/t patient's altered mental status.   During my exam, patient appears incredibly lethargic; however, daughter tells me he is much more responsive than before.  They tell me current status is far from patient's baseline. Normally interactive, ambulates occasionally with a walker - spends most of his time sitting or in bed. Has a good appetite baseline, does not adhere to renal diet. Patient lives with his daughter, Andrew [name].   Daughter is frustrated with patient's frequent hospitalizations and multiple medical problems - she tells me she feels like he did not require many of the hospitalizations and he only came because the hospital is his "safe place". We talked through patient's current admission and reasons he requires hospitalization.  We talked  about patient's inability to maintain PO intake - she tells me that NG tube was recommended - we discussed risks and benefits of NG tube for nutrition. Daughter and grandson agree to wait another day to see how patient does before proceeding with NG placement.   We discussed the patient's code status - she tells me she wants aggressive treatment. Confirms full code. We talked through what this could look like for the patient.   We also discussed HD - she tells me patient has tolerated HD well. Per notes, patient could not tolerate ultrafiltration. She tells me that if patient needed to be transferred to the ICU for CRRT/vasopressors she would want this.   Daughter is very hopeful patient is going to become more alert. In her opinion, his mental status is improving. She is hoping new antibiotic helps.  Discussed that we will see how patient does over the next 24 hours re: PO intake, tolerating HD, mental status, etc and readdress tomorrow if  she wants to move forward with feeding tube/agressive care.   I shared my concerns that patient is not doing well and may not improve.  Questions and concerns were addressed. The family was encouraged to call with questions or concerns.   Recommendations/Plan:  Hold off on NG placement today - family wants to allow patient time to respond to new antibiotic and hopeful mental status improves to eat  Continue all other aggressive measures - daughter would want ICU transfer/pressors/CRRT if patient required this  Will follow up tomorrow  Goals of Care and Additional Recommendations:  Limitations on Scope of Treatment: Full Scope Treatment  Code Status:  Full code  Prognosis:   Unable to determine very poor prognosis with severe deconditioning and comorbidities  Discharge Planning:  To Be Determined  Care plan was discussed with bedside RN, patient's daughter and grandson  Thank you for allowing the Palliative Medicine Team to assist in the care  of this patient.   Total Time 40 minutes Prolonged Time Billed  no       Greater than 50%  of this time was spent counseling and coordinating care related to the above assessment and plan.  Juel Burrow, DNP, AGNP-C Palliative Medicine Team Team Phone # [phone]

## 2018-01-14 NOTE — Progress Notes (Signed)
Spoke with pt's daughter via telephone, gave password. Gave update on pt's continued lethargy overnight and Blood sugar this am as well as treatment.

## 2018-01-14 NOTE — Consult Note (Signed)
Andrew [name] , MD [address], [place], [place], [place], [address] [address] [place], [place], [place], [address] Phone: [phone]  Fax: [phone]  Consultation  Referring Provider: Dr Posey Pronto  Primary Care Physician:  [name], MD Primary Gastroenterologist: Dr Tiffany Kocher           Reason for Consultation:     G tube   Date of Admission:  01/05/2018 Date of Consultation:  01/14/2018         HPI:   Andrew [name] is a 84 y.o. male has previously been known to Dr. Vira Agar and is actually last seen him in December 2018 as a follow-up for a GI bleed.  Known to have erosive gastritis by his last upper endoscopy.  Apparently has had a large duodenal submucosal mass resected by [facility] or [facility] at some point of time per Dr. Percell Boston note.  This admission he has been admitted for a pneumonia, respiratory failure, CHF.  He has end-stage renal disease.  He has paroxysmal atrial fibrillation and is on amiodarone and aspirin.  He has been seen by palliative care yesterday.  Per palliative note from yesterday did believe that he is approaching end-of-life close to dying.  A chest x-ray performed yesterday shows worsening pleural effusions, bibasilar pulmonary opacities and edema.  I have been consulted to place a G-tube for purpose of feeding.  Past Medical History:  Diagnosis Date  . Arrhythmia    atrial fibrillation  . Arteriosclerosis of coronary artery 01/27/2014  . Benign essential HTN 12/11/2013  . Carpal tunnel syndrome   . Chronic kidney disease   . Coronary atherosclerosis of native coronary artery   . Disorder of mitral valve 12/11/2013  . Encounter for long-term (current) use of antiplatelets/antithrombotics   . Gout   . Hyperlipidemia   . Hypertension   . Lung nodule    found on xray in Aug 2018  . Mitral valve disorder   . Osteoarthritis   . Prostate cancer [facility])     Past Surgical History:  Procedure Laterality Date  . COLONOSCOPY    . COLONOSCOPY WITH PROPOFOL N/A  07/23/2016   Procedure: COLONOSCOPY WITH PROPOFOL;  Surgeon: Manya Silvas, MD;  Location: [facility] ENDOSCOPY;  Service: Endoscopy;  Laterality: N/A;  . CORONARY ANGIOPLASTY WITH STENT PLACEMENT    . DIALYSIS/PERMA CATHETER INSERTION N/A 12/09/2017   Procedure: DIALYSIS/PERMA CATHETER INSERTION;  Surgeon: Algernon Huxley, MD;  Location: [facility] LAB;  Service: Cardiovascular;  Laterality: N/A;  . DIALYSIS/PERMA CATHETER INSERTION N/A 12/16/2017   Procedure: DIALYSIS/PERMA CATHETER INSERTION;  Surgeon: Algernon Huxley, MD;  Location: [facility] LAB;  Service: Cardiovascular;  Laterality: N/A;  . ESOPHAGOGASTRODUODENOSCOPY Left 07/28/2017   Procedure: ESOPHAGOGASTRODUODENOSCOPY (EGD);  Surgeon: Virgel Manifold, MD;  Location: [facility] ENDOSCOPY;  Service: Endoscopy;  Laterality: Left;  . ESOPHAGOGASTRODUODENOSCOPY (EGD) WITH PROPOFOL N/A 07/23/2016   Procedure: ESOPHAGOGASTRODUODENOSCOPY (EGD) WITH PROPOFOL;  Surgeon: Manya Silvas, MD;  Location: [facility] ENDOSCOPY;  Service: Endoscopy;  Laterality: N/A;  . PROSTATE BIOPSY      Prior to Admission medications   Medication Sig Start Date End Date Taking? Authorizing Provider  amiodarone (PACERONE) 400 MG tablet Take 1 tablet (400 mg total) by mouth daily. 12/30/17  Yes Gouru, Illene Silver, MD  aspirin EC 81 MG tablet Take 1 tablet (81 mg total) by mouth daily. 12/17/17 03/17/18 Yes Gladstone Lighter, MD  atorvastatin (LIPITOR) 40 MG tablet Take 40 mg by mouth daily. 04/24/15  Yes [provider]  Diphenhyd-Hydrocort-Nystatin (FIRST-DUKES MOUTHWASH MT) Take 5 mLs by mouth 4 (four) times daily. 01/03/18  Yes [provider]  finasteride (PROSCAR) 5 MG tablet Take 5 mg by mouth daily. 04/13/15  Yes [provider]  megestrol (MEGACE) 40 MG/ML suspension Take 5 mLs by mouth at bedtime. 01/01/18  Yes [provider]  midodrine (PROAMATINE) 10 MG tablet Take 1 tablet (10 mg total) by mouth 3 (three) times daily with meals.  12/29/17  Yes Gouru, Illene Silver, MD  multivitamin (RENA-VIT) TABS tablet Take 1 tablet by mouth at bedtime. 12/17/17  Yes Gladstone Lighter, MD  multivitamin-lutein [facility]) CAPS capsule Take 1 capsule by mouth daily. 12/29/17  Yes Gouru, Illene Silver, MD  omeprazole (PRILOSEC) 20 MG capsule Take 1 capsule (20 mg total) by mouth 2 (two) times daily. 07/28/17 07/28/18 Yes Bettey Costa, MD  acetaminophen (TYLENOL) 325 MG tablet Take 2 tablets (650 mg total) by mouth every 6 (six) hours as needed for mild pain (or Fever >/= 101). 12/29/17   Gouru, Illene Silver, MD  benzonatate (TESSALON) 200 MG capsule Take 1 capsule (200 mg total) by mouth 3 (three) times daily as needed for cough. 12/29/17   Gouru, Illene Silver, MD  fluticasone (FLONASE) 50 MCG/ACT nasal spray Place 2 sprays into both nostrils daily. 12/18/17   Gladstone Lighter, MD  guaiFENesin (MUCINEX) 600 MG 12 hr tablet Take 1 tablet (600 mg total) by mouth 2 (two) times daily as needed. 12/29/17   Nicholes Mango, MD  HYDROcodone-acetaminophen (NORCO/VICODIN) 5-325 MG tablet Take 1-2 tablets by mouth every 4 (four) hours as needed for moderate pain. Patient taking differently: Take 1 tablet by mouth every 4 (four) hours as needed for moderate pain.  12/29/17   Nicholes Mango, MD  Nutritional Supplements (FEEDING SUPPLEMENT, NEPRO CARB STEADY,) LIQD Take 237 mLs by mouth 3 (three) times daily between meals. 12/17/17 01/16/18  Gladstone Lighter, MD  senna-docusate (SENOKOT-S) 8.6-50 MG tablet Take 1 tablet by mouth at bedtime as needed for mild constipation. 12/29/17   Gouru, Illene Silver, MD  sodium chloride (OCEAN) 0.65 % SOLN nasal spray Place 1 spray into both nostrils as needed for congestion. 12/17/17   Gladstone Lighter, MD    Family History  Problem Relation Age of Onset  . Cancer Mother        stomach and uterus  . Cancer Sister        breast     Social History   Tobacco Use  . Smoking status: Never Smoker  . Smokeless tobacco: Never Used  Substance Use Topics  .  Alcohol use: No  . Drug use: No    Allergies as of 01/03/2018  . (No Known Allergies)    Review of Systems:    Cannot assess    Physical Exam:  Vital signs in last 24 hours: Temp:  [98.5 F (36.9 C)-99.1 F (37.3 C)] 98.5 F (36.9 C) (06/04 0358) Pulse Rate:  [84-98] 89 (06/04 0358) Resp:  [15-24] 16 (06/04 0358) BP: (74-107)/(51-82) 93/53 (06/04 0358) SpO2:  [97 %-100 %] 99 % (06/04 0358) Weight:  [156 lb 1.4 oz (70.8 kg)] 156 lb 1.4 oz (70.8 kg) (06/03 1251) Last BM Date: 01/12/18 General:   Unable to communicate  Head:  Normocephalic and atraumatic. Eyes:   No icterus.   Conjunctiva pink. PERRLA. Ears:  Cannot assess  Neck:  Supple; no masses or thyroidomegaly Lungs: Respirations even and unlabored, decreased air entry b/l  Heart:  Regular rate and rhythm;  Without murmur, clicks, rubs or gallops  Abdomen:  Soft, nondistended, nontender. Normal bowel sounds. No appreciable masses or hepatomegaly.  No rebound or guarding.  Neurologic:  Alert and oriented x0. Skin:  Intact without significant lesions or rashes. Cervical Nodes:  No significant cervical adenopathy. Psych:  Cannot assess  LAB RESULTS: Recent Labs    01/13/18 1011 01/14/18 0609  WBC 16.9* 15.6*  HGB 9.5* 8.8*  HCT 30.0* 27.2*  PLT 211 165   BMET Recent Labs    01/14/18 0609  NA 141  K 3.5  CL 102  CO2 27  GLUCOSE 37*  BUN 25*  CREATININE 3.05*  CALCIUM 7.7*   LFT Recent Labs    01/13/18 1719 01/14/18 0609  PROT 4.7*  --   ALBUMIN 2.2* 1.9*  AST 186*  --   ALT 152*  --   ALKPHOS 102  --   BILITOT 3.4*  --   BILIDIR 1.7*  --   IBILI 1.7*  --    PT/INR Recent Labs    01/13/18 1719  LABPROT 15.0  INR 1.19    STUDIES: Dg Chest Port 1 View  Result Date: 01/13/2018 CLINICAL DATA:  Pneumonia. History of prostate cancer, hypertension and mitral valve disorder. EXAM: PORTABLE CHEST 1 VIEW COMPARISON:  Radiographs 12/31/2017 and 12/26/2017. FINDINGS: 1452 hours. Right IJ  hemodialysis catheters are unchanged near the superior cavoatrial junction. The heart size and mediastinal contours are stable. There are increased pleural effusions and right-greater-than-left basilar airspace opacities. There is probable mild edema. No pneumothorax. The bones appear unchanged. IMPRESSION: Worsening pleural effusions, bibasilar pulmonary opacities and probable edema. Electronically Signed   By: Richardean Sale M.D.   On: 01/13/2018 15:15      Impression / Plan:   Andrew [name] is a 84 y.o. y/o male admitted respiratory failure secondary to pneumonia, CHF.  I have been consulted for placement of a G-tube.  He has end-stage renal disease and paroxysmal atrial fibrillation.  Based on his last chest x-ray his lungs look worse with pleural effusions and edema.  I noted palliative care note which suggests he probably is at end of life.  At this point of time the risks of any G-tube placement including anesthesia outweigh any benefits.  If feeding is needed I would suggest to continue with the nas0- jejunal tube.  A G-tube by itself does not decrease aspiration.  If he does get better from his respiratory point of view and his lungs recover and he is able to tolerate anesthesia at a later point G-tube can be reevaluated.   I have discussed the plan with the family.   I will sign off.  Please call me if any further GI concerns or questions.  We would like to thank you for the opportunity to participate in the care of Andrew [name].  Thank you for involving me in the care of this patient.      LOS: 1 day   Andrew Bellows, MD  01/14/2018, 9:47 AM

## 2018-01-14 NOTE — Progress Notes (Addendum)
Talked to palliative care Shae concerning pt crying out with pain. She stated she would come over for a visit today. Pt is not crying out now but I am concerned he will again. I gave Tylenol suppository this am.

## 2018-01-15 DIAGNOSIS — A419 Sepsis, unspecified organism: Principal | ICD-10-CM

## 2018-01-15 LAB — CBC
HEMATOCRIT: 28.2 % — AB (ref 40.0–52.0)
Hemoglobin: 9 g/dL — ABNORMAL LOW (ref 13.0–18.0)
MCH: 24.4 pg — ABNORMAL LOW (ref 26.0–34.0)
MCHC: 31.9 g/dL — ABNORMAL LOW (ref 32.0–36.0)
MCV: 76.6 fL — AB (ref 80.0–100.0)
Platelets: 158 10*3/uL (ref 150–440)
RBC: 3.69 MIL/uL — AB (ref 4.40–5.90)
RDW: 37.5 % — ABNORMAL HIGH (ref 11.5–14.5)
WBC: 13.7 10*3/uL — AB (ref 3.8–10.6)

## 2018-01-15 LAB — BLOOD GAS, ARTERIAL
Acid-base deficit: 0.1 mmol/L (ref 0.0–2.0)
BICARBONATE: 23.5 mmol/L (ref 20.0–28.0)
FIO2: 0.36
O2 Saturation: 99.7 %
PATIENT TEMPERATURE: 37
pCO2 arterial: 33 mmHg (ref 32.0–48.0)
pH, Arterial: 7.46 — ABNORMAL HIGH (ref 7.350–7.450)
pO2, Arterial: 183 mmHg — ABNORMAL HIGH (ref 83.0–108.0)

## 2018-01-15 LAB — BASIC METABOLIC PANEL
ANION GAP: 15 (ref 5–15)
BUN: 39 mg/dL — ABNORMAL HIGH (ref 6–20)
CALCIUM: 7.9 mg/dL — AB (ref 8.9–10.3)
CHLORIDE: 100 mmol/L — AB (ref 101–111)
CO2: 25 mmol/L (ref 22–32)
Creatinine, Ser: 4.25 mg/dL — ABNORMAL HIGH (ref 0.61–1.24)
GFR calc non Af Amer: 12 mL/min — ABNORMAL LOW (ref >60)
GFR, EST AFRICAN AMERICAN: 14 mL/min — AB (ref >60)
Glucose, Bld: <20 mg/dL — CL (ref 65–99)
POTASSIUM: 3.7 mmol/L (ref 3.5–5.1)
Sodium: 140 mmol/L (ref 135–145)

## 2018-01-15 LAB — GLUCOSE, CAPILLARY
GLUCOSE-CAPILLARY: 89 mg/dL (ref 65–99)
GLUCOSE-CAPILLARY: 196 mg/dL — AB (ref 65–99)
GLUCOSE-CAPILLARY: 315 mg/dL — AB (ref 65–99)
Glucose-Capillary: 256 mg/dL — ABNORMAL HIGH (ref 65–99)
Glucose-Capillary: 291 mg/dL — ABNORMAL HIGH (ref 65–99)
Glucose-Capillary: 89 mg/dL (ref 65–99)

## 2018-01-15 LAB — CULTURE, BLOOD (ROUTINE X 2)
CULTURE: NO GROWTH
Culture: NO GROWTH

## 2018-01-15 LAB — CORTISOL: Cortisol, Plasma: >100 ug/dL

## 2018-01-15 LAB — MRSA PCR SCREENING: MRSA by PCR: NEGATIVE

## 2018-01-15 MED ORDER — LORAZEPAM 2 MG/ML IJ SOLN
INTRAMUSCULAR | Status: AC
  Administered 2018-01-15: 1 mg via INTRAVENOUS
  Filled 2018-01-15: qty 1

## 2018-01-15 MED ORDER — LORAZEPAM 2 MG/ML IJ SOLN
1.0000 mg | Freq: Once | INTRAMUSCULAR | Status: AC
  Administered 2018-01-15: 1 mg via INTRAVENOUS

## 2018-01-15 MED ORDER — LORAZEPAM 2 MG/ML IJ SOLN
2.5000 mg | Freq: Once | INTRAMUSCULAR | Status: DC
  Filled 2018-01-15: qty 1.3
  Filled 2018-01-15: qty 2

## 2018-01-15 MED ORDER — DEXTROSE 50 % IV SOLN
INTRAVENOUS | Status: AC
  Administered 2018-01-15: 50 mL via INTRAVENOUS
  Filled 2018-01-15: qty 50

## 2018-01-15 MED ORDER — FAMOTIDINE IN NACL 20-0.9 MG/50ML-% IV SOLN
20.0000 mg | Freq: Every day | INTRAVENOUS | Status: DC
  Administered 2018-01-15 – 2018-01-16 (×2): 20 mg via INTRAVENOUS
  Filled 2018-01-15 (×2): qty 50

## 2018-01-15 MED ORDER — NOREPINEPHRINE BITARTRATE 1 MG/ML IV SOLN
0.0000 ug/min | INTRAVENOUS | Status: DC
  Administered 2018-01-15: 4 ug/min via INTRAVENOUS
  Administered 2018-01-16: 10 ug/min via INTRAVENOUS
  Filled 2018-01-15 (×3): qty 16

## 2018-01-15 MED ORDER — DEXTROSE 50 % IV SOLN
50.0000 mL | Freq: Once | INTRAVENOUS | Status: AC
  Administered 2018-01-15: 50 mL via INTRAVENOUS

## 2018-01-15 MED ORDER — HYDROCORTISONE NA SUCCINATE PF 100 MG IJ SOLR
100.0000 mg | Freq: Three times a day (TID) | INTRAMUSCULAR | Status: DC
  Administered 2018-01-15 – 2018-01-16 (×3): 100 mg via INTRAVENOUS
  Filled 2018-01-15 (×3): qty 2

## 2018-01-15 MED ORDER — VASOPRESSIN 20 UNIT/ML IV SOLN
0.0400 [IU]/min | INTRAVENOUS | Status: DC
  Administered 2018-01-15 – 2018-01-17 (×3): 0.03 [IU]/min via INTRAVENOUS
  Filled 2018-01-15 (×3): qty 2

## 2018-01-15 MED ORDER — LORAZEPAM 2 MG/ML IJ SOLN
2.0000 mg | Freq: Once | INTRAMUSCULAR | Status: AC
  Administered 2018-01-15: 2 mg via INTRAVENOUS
  Filled 2018-01-15: qty 1

## 2018-01-15 MED ORDER — NOREPINEPHRINE 4 MG/250ML-% IV SOLN
0.0000 ug/min | INTRAVENOUS | Status: DC
  Administered 2018-01-15 (×2): 20 ug/min via INTRAVENOUS
  Filled 2018-01-15: qty 250

## 2018-01-15 MED ORDER — DEXTROSE 50 % IV SOLN
INTRAVENOUS | Status: AC
  Administered 2018-01-15: 50 mL
  Filled 2018-01-15: qty 50

## 2018-01-15 MED ORDER — DEXTROSE 10 % IV SOLN
INTRAVENOUS | Status: DC
  Administered 2018-01-15: 12:00:00 via INTRAVENOUS

## 2018-01-15 MED ORDER — DEXMEDETOMIDINE HCL IN NACL 400 MCG/100ML IV SOLN
0.4000 ug/kg/h | INTRAVENOUS | Status: DC
  Administered 2018-01-15: 0.5 ug/kg/h via INTRAVENOUS
  Administered 2018-01-16: 1 ug/kg/h via INTRAVENOUS
  Administered 2018-01-16: 0.8 ug/kg/h via INTRAVENOUS
  Administered 2018-01-16: 0.6 ug/kg/h via INTRAVENOUS
  Administered 2018-01-17 (×2): 0.8 ug/kg/h via INTRAVENOUS
  Administered 2018-01-17: 0.6 ug/kg/h via INTRAVENOUS
  Filled 2018-01-15 (×6): qty 100

## 2018-01-15 MED ORDER — NOREPINEPHRINE 16 MG/250ML-% IV SOLN
0.0000 ug/min | INTRAVENOUS | Status: DC
  Filled 2018-01-15: qty 250

## 2018-01-15 NOTE — Progress Notes (Signed)
SLP Cancellation Note  Patient Details Name: Andrew [name] MRN: [id number] DOB: 1932-08-22   Cancelled treatment:       Reason Eval/Treat Not Completed: Medical issues which prohibited therapy(chart reviewed). Per NSG note, pt's medical status has declined(HD had to be stopped) to require transfer to ICU. Per chart notes, pt continues to pocket Pills in his mouth, and does not swallow them. Palliative Care note stated pt is approaching end-of-life, close to dying. A chest x-ray performed yesterday shows worsening pleural effusions, bibasilar pulmonary opacities and edema per GI note. NSG also reports pt is agitated requiring mitts, and order for Ativan.  Given this above information, ST services does not recommend any oral intake if pt is not fully alert to task of oral intake or if he is orally holding/pocketing po's as pt is at increased risk for aspiration. Recommend supervision w/ all oral intake and stop if overt s/s of aspiration are noted(coughing, etc). Recommendation for upgrade in diet consistency is not appropriate at this time d/t pt's medical and cognitive status'. ST services will be available over the next few days for any further education or assessment.     Orinda Kenner, MS, CCC-SLP Andrew [name] 01/15/2018, 1:54 PM

## 2018-01-15 NOTE — Progress Notes (Signed)
RN call pt's daughter Kieth Brightly to let her know pt will be transferring to ICU.

## 2018-01-15 NOTE — Progress Notes (Signed)
Pre HD Assessment    01/15/18 0900  Neurological  Level of Consciousness Responds to Voice  Orientation Level Disoriented X4  Respiratory  Respiratory Pattern Regular  Chest Assessment Chest expansion symmetrical  Bilateral Breath Sounds Diminished;Fine crackles  Cardiac  Pulse Regular  ECG Monitor Yes  Vascular  R Radial Pulse +2  L Radial Pulse +2  Edema Generalized  Psychosocial  Psychosocial (WDL) WDL

## 2018-01-15 NOTE — Progress Notes (Signed)
Lab notified RN pt's glucose was <20. RN notified MD and gave 1/2 amp of D50 per protocol at dialysis. RN recheck pt blood glucose after administering D50 and the result was 89. Per MD pt will be transfer to ICU.

## 2018-01-15 NOTE — Progress Notes (Signed)
[facility]                                                                                                                                                                                  Patient Demographics   Andrew [name], is a 84 y.o. male, DOB - 11/06/1932, FIE:[id number]  Admit date - 01/01/2018   Admitting Physician Loletha Grayer, MD  Outpatient Primary MD for the patient is [name], MD   LOS - 2  Subjective: Patient was in dialysis started becoming more lethargic hypotensive blood sugar started decreasing  Review of Systems:   CONSTITUTIONAL: Hard to arouse  Vitals:   Vitals:   01/15/18 1510 01/15/18 1515 01/15/18 1520 01/15/18 1525  BP: (!) 96/34 (!) 65/40 (!) 84/52 (!) 88/45  Pulse:  (!) 104  (!) 102  Resp: (!) 22 (!) 21 (!) 24 (!) 29  Temp:      TempSrc:      SpO2:  100%  99%  Weight:      Height:        Wt Readings from Last 3 Encounters:  01/13/18 70.8 kg (156 lb 1.4 oz)  12/26/17 73.5 kg (162 lb)  12/21/17 72.4 kg (159 lb 11.2 oz)    No intake or output data in the 24 hours ending 01/15/18 1528  Physical Exam:   GENERAL: Critically ill-appearing HEAD, EYES, EARS, NOSE AND THROAT: Atraumatic, normocephalic.  Pupils equal and reactive to light. Sclerae anicteric. No conjunctival injection. No oro-pharyngeal erythema.  NECK: Supple. There is no jugular venous distention. No bruits, no lymphadenopathy, no thyromegaly.  HEART: Regular rate and rhythm,. No murmurs, no rubs, no clicks.  LUNGS: Rhonchus breath sounds bilaterally ABDOMEN: Soft, flat, nontender, nondistended. Has good bowel sounds. No hepatosplenomegaly appreciated.  EXTREMITIES: No evidence of any cyanosis, clubbing, or 2+ peripheral edema.  +2 pedal and radial pulses bilaterally.  NEUROLOGIC: Drowsy  sKIN: Moist and warm with no rashes appreciated.  Psych: Drowsy LN: No inguinal LN enlargement    Antibiotics   Anti-infectives (From  admission, onward)   Start     Dose/Rate Route Frequency Ordered Stop   01/13/18 1700  Ampicillin-Sulbactam (UNASYN) 3 g in sodium chloride 0.9 % 100 mL IVPB     3 g 200 mL/hr over 30 Minutes Intravenous Every 12 hours 01/13/18 1450     01/11/18 1000  ceFEPIme (MAXIPIME) 1 g in sodium chloride 0.9 % 100 mL IVPB  Status:  Discontinued     1 g 200 mL/hr over 30 Minutes Intravenous Every 24 hours 01/01/2018 1535 01/13/18 1443   12/16/2017 1700  vancomycin (VANCOCIN) 500 mg in sodium chloride 0.9 %  100 mL IVPB     500 mg 100 mL/hr over 60 Minutes Intravenous  Once 12/27/2017 1536 12/13/2017 1708   12/20/2017 1115  fluconazole (DIFLUCAN) tablet 100 mg  Status:  Discontinued     100 mg Oral Daily 12/15/2017 1112 01/14/18 1333   12/11/2017 1015  ceFEPIme (MAXIPIME) 2 g in sodium chloride 0.9 % 100 mL IVPB     2 g 200 mL/hr over 30 Minutes Intravenous  Once 12/14/2017 1005 01/08/2018 1127   12/13/2017 1015  vancomycin (VANCOCIN) IVPB 1000 mg/200 mL premix     1,000 mg 200 mL/hr over 60 Minutes Intravenous  Once 12/18/2017 1005 01/06/2018 1227      Medications   Scheduled Meds: . amiodarone  200 mg Oral Daily  . aspirin EC  81 mg Oral Daily  . atorvastatin  40 mg Oral q1800  . Chlorhexidine Gluconate Cloth  6 each Topical Q0600  . collagenase   Topical Daily  . feeding supplement (NEPRO CARB STEADY)  237 mL Oral TID BM  . finasteride  5 mg Oral Daily  . fluticasone  2 spray Each Nare Daily  . heparin injection (subcutaneous)  5,000 Units Subcutaneous Q12H  . hydrocortisone sod succinate (SOLU-CORTEF) inj  100 mg Intravenous Q8H  . lidocaine  15 mL Mouth/Throat Q6H  . magic mouthwash  15 mL Oral QID  . megestrol  200 mg Oral QHS  . midodrine  20 mg Oral TID WC  . mirtazapine  7.5 mg Oral QHS  . multivitamin  1 tablet Oral QHS  . pantoprazole  40 mg Oral Daily   Continuous Infusions: . albumin human    . ampicillin-sulbactam (UNASYN) IV Stopped (01/15/18 0438)  . dexmedetomidine (PRECEDEX) IV infusion 0.5  mcg/kg/hr (01/15/18 1525)  . dextrose 75 mL/hr at 01/15/18 1525  . norepinephrine (LEVOPHED) Adult infusion 20 mcg/min (01/15/18 1435)  . vasopressin (PITRESSIN) infusion - *FOR SHOCK*     PRN Meds:.acetaminophen, benzonatate, senna-docusate, sodium chloride   Data Review:   Micro Results Recent Results (from the past 240 hour(s))  Blood Culture (routine x 2)     Status: None   Collection Time: 01/03/2018 10:06 AM  Result Value Ref Range Status   Specimen Description BLOOD LEFT ARM  Final   Special Requests   Final    BOTTLES DRAWN AEROBIC AND ANAEROBIC Blood Culture results may not be optimal due to an inadequate volume of blood received in culture bottles   Culture   Final    NO GROWTH 5 DAYS Performed at [facility], [address] [place], [place] [address]    Report Status 01/15/2018 FINAL  Final  Blood Culture (routine x 2)     Status: None   Collection Time: 12/30/2017 10:11 AM  Result Value Ref Range Status   Specimen Description BLOOD LEFT ARM  Final   Special Requests   Final    BOTTLES DRAWN AEROBIC AND ANAEROBIC Blood Culture results may not be optimal due to an inadequate volume of blood received in culture bottles   Culture   Final    NO GROWTH 5 DAYS Performed at [facility], [address], [place], [place] [address]    Report Status 01/15/2018 FINAL  Final  MRSA PCR Screening     Status: None   Collection Time: 12/21/2017  2:30 PM  Result Value Ref Range Status   MRSA by PCR NEGATIVE NEGATIVE Final    Comment:        The GeneXpert MRSA  Assay (FDA approved for NASAL specimens only), is one component of a comprehensive MRSA colonization surveillance program. It is not intended to diagnose MRSA infection nor to guide or monitor treatment for MRSA infections. Performed at [facility], [address], [place], [place] [address]     Radiology Reports Dg Chest 2 View  Result Date: 12/26/2017 CLINICAL DATA:  Shortness of  breath and weakness for several hours EXAM: CHEST - 2 VIEW COMPARISON:  12/20/2017 FINDINGS: Dialysis catheter is again identified. Stable cardiomegaly is seen. Small pleural effusions and bilateral lower lobe infiltrate/atelectasis is noted slightly greater on the right than the left. The overall appearance is similar to that seen on the prior exam. No new bony abnormality is seen. IMPRESSION: Overall stable appearance of the chest with bilateral pleural effusions and lower lobe infiltrate/atelectasis. Electronically Signed   By: Inez Catalina M.D.   On: 12/26/2017 16:21   Dg Chest 2 View  Result Date: 12/20/2017 CLINICAL DATA:  Chest tightness with mild dyspnea. EXAM: CHEST - 2 VIEW COMPARISON:  12/01/2017 FINDINGS: Stable cardiomegaly with moderate aortic atherosclerosis. New bilateral small pleural effusions obscuring the diaphragms and spanning approximately of 1-1/2 to 2 vertebral body heights on the lateral view. New right IJ dialysis catheter tip terminates in the distal SVC. IMPRESSION: Cardiomegaly with new small bilateral pleural effusions. No overt pulmonary edema. New right IJ dialysis catheter is noted without apparent complication. Electronically Signed   By: Ashley Royalty M.D.   On: 12/20/2017 00:53   Mr Brain Wo Contrast  Result Date: 12/15/2017 CLINICAL DATA:  84 year old male dialysis patient with altered mental status, weakness, and slurred speech. EXAM: MRI HEAD WITHOUT CONTRAST TECHNIQUE: Multiplanar, multiecho pulse sequences of the brain and surrounding structures were obtained without intravenous contrast. COMPARISON:  None. FINDINGS: Brain: Generalized cerebral volume loss with ex vacuo appearing ventricular enlargement. No restricted diffusion to suggest acute infarction. No midline shift, mass effect, evidence of mass lesion, extra-axial collection or acute intracranial hemorrhage. Cervicomedullary junction and pituitary are within normal limits. Pearline Cables and white matter signal is  within normal limits for age throughout the brain. No cortical encephalomalacia or definite chronic cerebral blood products. Signal in the deep gray matter nuclei, brainstem, and cerebellum is normal for age. Vascular: Major intracranial vascular flow voids are preserved. Skull and upper cervical spine: Negative for age visible cervical spine. Normal bone marrow signal. Sinuses/Orbits: Normal orbits soft tissues. Paranasal sinuses and mastoids are well pneumatized. Other: Visible internal auditory structures appear normal. Scalp and face soft tissues appear negative. IMPRESSION: No acute intracranial abnormality. Negative for age noncontrast MRI appearance of the brain. Electronically Signed   By: Genevie Ann M.D.   On: 12/20/2017 14:27   Dg Chest Port 1 View  Result Date: 01/13/2018 CLINICAL DATA:  Pneumonia. History of prostate cancer, hypertension and mitral valve disorder. EXAM: PORTABLE CHEST 1 VIEW COMPARISON:  Radiographs 12/19/2017 and 12/26/2017. FINDINGS: 1452 hours. Right IJ hemodialysis catheters are unchanged near the superior cavoatrial junction. The heart size and mediastinal contours are stable. There are increased pleural effusions and right-greater-than-left basilar airspace opacities. There is probable mild edema. No pneumothorax. The bones appear unchanged. IMPRESSION: Worsening pleural effusions, bibasilar pulmonary opacities and probable edema. Electronically Signed   By: Richardean Sale M.D.   On: 01/13/2018 15:15   Dg Chest Port 1 View  Result Date: 01/05/2018 CLINICAL DATA:  Syncope. EXAM: PORTABLE CHEST 1 VIEW COMPARISON:  12/26/2017. FINDINGS: Dual-lumen catheter with tip over superior vena cava. Cardiomegaly. Progressive right  lower lobe infiltrate. Right pleural effusion again noted. No pneumothorax. IMPRESSION: 1.  Dual-lumen catheter with tip over superior vena cava. 2. Progressive right lower lobe infiltrate. Right pleural effusion again noted. Electronically Signed   By: Marcello Moores   Register   On: 01/05/2018 09:04     CBC Recent Labs  Lab 12/15/2017 2952 01/13/18 1011 01/14/18 0609 01/15/18 0932  WBC 19.8* 16.9* 15.6* 13.7*  HGB 11.7* 9.5* 8.8* 9.0*  HCT 36.9* 30.0* 27.2* 28.2*  PLT 272 211 165 158  MCV 74.6* 76.0* 76.1* 76.6*  MCH 23.6* 24.1* 24.5* 24.4*  MCHC 31.7* 31.7* 32.3 31.9*  RDW 24.7* 37.0* 36.9* 37.5*  LYMPHSABS  --  0.7* 1.2  --   MONOABS  --  0.8 0.9  --   EOSABS  --  0.0 0.1  --   BASOSABS  --  0.0 0.0  --     Chemistries  Recent Labs  Lab 12/26/2017 0935 01/13/18 1719 01/14/18 0609 01/15/18 0932  NA 138  --  141 140  K 3.3*  --  3.5 3.7  CL 101  --  102 100*  CO2 24  --  27 25  GLUCOSE 108*  --  37* <20*  BUN 31*  --  25* 39*  CREATININE 3.64*  --  3.05* 4.25*  CALCIUM 8.0*  --  7.7* 7.9*  MG 1.9  --   --   --   AST 300* 186*  --   --   ALT 201* 152*  --   --   ALKPHOS 150* 102  --   --   BILITOT 3.5* 3.4*  --   --    ------------------------------------------------------------------------------------------------------------------ estimated creatinine clearance is 13.4 mL/min (A) (by C-G formula based on SCr of 4.25 mg/dL (H)). ------------------------------------------------------------------------------------------------------------------ No results for input(s): HGBA1C in the last 72 hours. ------------------------------------------------------------------------------------------------------------------ No results for input(s): CHOL, HDL, LDLCALC, TRIG, CHOLHDL, LDLDIRECT in the last 72 hours. ------------------------------------------------------------------------------------------------------------------ No results for input(s): TSH, T4TOTAL, T3FREE, THYROIDAB in the last 72 hours.  Invalid input(s): FREET3 ------------------------------------------------------------------------------------------------------------------ No results for input(s): VITAMINB12, FOLATE, FERRITIN, TIBC, IRON, RETICCTPCT in the last 72  hours.  Coagulation profile Recent Labs  Lab 01/13/18 1719  INR 1.19    No results for input(s): DDIMER in the last 72 hours.  Cardiac Enzymes Recent Labs  Lab 12/31/2017 0935 12/20/2017 1245 01/09/2018 1759  TROPONINI 0.63* 0.57* 0.56*   ------------------------------------------------------------------------------------------------------------------ Invalid input(s): POCBNP    Assessment & Plan   1.    Hypotension suspect due to combination of pneumonia and progressive worsening of the condition patient will need pressors he is being transferred to the ICU 2.  Healthcare associated pneumonia, leukocytosis.    Continue Unasyn  3.    Acute on chronic chronic hypoxic respiratory failure on oxygen.  4.  Hypoglycemia this is related to his sepsis poor p.o. intake we will start him on a dextrose infusion 5.  History of congestive heart failure with mid range ejection fraction.  Dialysis to manage fluid. 6.  End-stage renal disease.    Dialysis per nephrology 7.  History of gout 8.  Anemia of chronic disease 9.  Paroxysmal atrial fibrillation on amiodarone and aspirin 10.  Weakness physical therapy evaluation   Prognosis very poor high risk of death I explained to the daughter palliative care consults pending   Code Status History    Date Active Date Inactive Code Status Order ID Comments User Context   12/26/2017 2019 12/29/2017 1914 Full Code [id number]  Demetrios Loll,  MD Inpatient   12/20/2017 0645 12/21/2017 1922 Full Code [id number]  Arta Silence, MD Inpatient   12/20/2017 0308 12/20/2017 0645 DNR [id number]  Amelia Jo, MD ED   12/02/2017 1128 12/17/2017 1611 DNR [id number]  Max Sane, MD Inpatient   12/01/2017 1522 12/02/2017 1128 Full Code [id number]  Demetrios Loll, MD Inpatient   10/30/2017 2256 11/02/2017 1709 Full Code [id number]  Lance Coon, MD Inpatient   07/26/2017 1728 07/28/2017 1620 Full Code [id number]  Saundra Shelling, MD Inpatient   04/08/2017 1555 04/10/2017 1526 Full  Code [id number]  Dustin Flock, MD Inpatient           Consults  nephrolgy  DVT Prophylaxis  heparin  Lab Results  Component Value Date   PLT 158 01/15/2018     Time Spent in minutes  6min critical care time spent Dustin Flock M.D on 01/15/2018 at 3:28 PM  Between 7am to 6pm - Pager - [phone]  After 6pm go to [email or web address] - Proofreader  [employer]   Office  [phone]

## 2018-01-15 NOTE — Progress Notes (Signed)
HD Tx started    01/15/18 0900  Vital Signs  Pulse Rate 98  Pulse Rate Source Monitor  Resp 19  BP (!) 90/58  BP Location Left Arm  BP Method Automatic  Patient Position (if appropriate) Lying  Oxygen Therapy  SpO2 98 %  O2 Device Nasal Cannula  O2 Flow Rate (L/min) 2 L/min  During Hemodialysis Assessment  Blood Flow Rate (mL/min) 400 mL/min  Arterial Pressure (mmHg) -170 mmHg  Venous Pressure (mmHg) 110 mmHg  Transmembrane Pressure (mmHg) 60 mmHg  Ultrafiltration Rate (mL/min) 720 mL/min  Dialysate Flow Rate (mL/min) 600 ml/min  Conductivity: Machine  14.2  HD Safety Checks Performed Yes  Dialysis Fluid Bolus Normal Saline  Bolus Amount (mL) 250 mL  Intra-Hemodialysis Comments Tx initiated

## 2018-01-15 NOTE — Progress Notes (Signed)
PT Cancellation Note  Patient Details Name: BOUBACAR LERETTE MRN: [id number] DOB: 12/09/1932   Cancelled Treatment:    Reason Eval/Treat Not Completed: Patient at procedure or test/unavailable. Pt in hemodialysis. Per 01/14/18 SW note; daughter wishes "aggressive care" for pt, not hospice at this time. Re attempt at a later time/date, when pt available.    Larae Grooms, PTA 01/15/2018, 10:17 AM

## 2018-01-15 NOTE — Progress Notes (Signed)
Pre HD Tx    01/15/18 0847  Hand-Off documentation  Report given to (Full Name) Beatris Ship, RN   Report received from (Full Name) Lum Babe, RN   Vital Signs  Temp 98 F (36.7 C)  Temp Source Oral  Pulse Rate 92  Pulse Rate Source Monitor  Resp 18  BP (!) 121/107  BP Location Left Arm  BP Method Automatic  Patient Position (if appropriate) Lying  Oxygen Therapy  SpO2 98 %  O2 Device Nasal Cannula  O2 Flow Rate (L/min) 2 L/min  Pain Assessment  Pain Scale 0-10  Pain Score 0  Time-Out for Hemodialysis  What Procedure? HD  Correct Site? Yes  Correct Side? Yes  Correct Procedure? Yes  Consents Verified? Yes  Rad Studies Available? N/A  Safety Precautions Reviewed? Yes  [employer] Number [id number]  Station Number 1  UF/Alarm Test Passed  Conductivity: Meter 14  Conductivity: Machine  14  pH 7.4  Reverse Osmosis Main  Normal Saline Lot Number [id number]  Dialyzer Lot Number [id number]  Disposable Set Lot Number [id number]  Machine Temperature 98.6 F (37 C)  Musician and Audible Yes  Blood Lines Intact and Secured Yes  Pre Treatment Patient Checks  Vascular access used during treatment Catheter  Hemodialysis Consent Verified Yes  Hemodialysis Standing Orders Initiated Yes  ECG (Telemetry) Monitor On Yes  Prime Ordered Normal Saline  Length of  DialysisTreatment -hour(s) 3 Hour(s)  Dialysis Treatment Comments Na 140  Dialyzer Elisio 17H NR  Dialysate 3K, 2.5 Ca  Dialysis Anticoagulant None  Dialysate Flow Ordered 600  Blood Flow Rate Ordered 400 mL/min  Ultrafiltration Goal 1 Liters  Pre Treatment Labs CBC;Hepatitis B Surface Antigen;Renal panel  Dialysis Blood Pressure Support Ordered Normal Saline  Education / Care Plan  Dialysis Education Provided No (Comment)  Documented Education in Care Plan  (Pt disoriented )

## 2018-01-15 NOTE — Progress Notes (Signed)
Daily Progress Note   Patient Name: Andrew [name]       Date: 01/15/2018 DOB: Dec 04, 1932  Age: 84 y.o. MRN#: [id number] Attending Physician: Dustin Flock, MD Primary Care Physician: [name], MD Admit Date: 01/06/2018  Reason for Consultation/Follow-up: Establishing goals of care  Subjective: Patient does not open eyes or speak, grimaces to touch. Daughter, Andrew [name], at bedside and tells me he is the same as yesterday.   Length of Stay: 2  Current Medications: Scheduled Meds:  . amiodarone  200 mg Oral Daily  . aspirin EC  81 mg Oral Daily  . atorvastatin  40 mg Oral q1800  . Chlorhexidine Gluconate Cloth  6 each Topical Q0600  . collagenase   Topical Daily  . dextrose      . feeding supplement (NEPRO CARB STEADY)  237 mL Oral TID BM  . finasteride  5 mg Oral Daily  . fluticasone  2 spray Each Nare Daily  . heparin injection (subcutaneous)  5,000 Units Subcutaneous Q12H  . lidocaine  15 mL Mouth/Throat Q6H  . magic mouthwash  15 mL Oral QID  . megestrol  200 mg Oral QHS  . midodrine  20 mg Oral TID WC  . mirtazapine  7.5 mg Oral QHS  . multivitamin  1 tablet Oral QHS  . pantoprazole  40 mg Oral Daily    Continuous Infusions: . albumin human    . ampicillin-sulbactam (UNASYN) IV Stopped (01/15/18 0438)  . dextrose 50 mL/hr at 01/15/18 1208    PRN Meds: acetaminophen, benzonatate, senna-docusate, sodium chloride  Physical Exam  Constitutional: He appears cachectic. He has a sickly appearance.  Does not respond to voice, grimaces to touch, does not follow commands  HENT:  Head: Normocephalic and atraumatic.  Cardiovascular: Normal rate and regular rhythm.  Pulmonary/Chest: Effort normal. He has decreased breath sounds.  Abdominal: Soft.  Musculoskeletal:       Right lower  leg: He exhibits edema.       Left lower leg: He exhibits edema.  Neurological: He is disoriented.  Skin: Skin is warm and dry.            Vital Signs: BP (!) 141/87 (BP Location: Left Arm)   Pulse 99   Temp 98.1 F (36.7 C)   Resp 20   Ht _0  (1.753 m)   Wt 70.8 kg (156 lb 1.4 oz)   SpO2 91%   BMI 23.05 kg/m  SpO2: SpO2: 91 % O2 Device: O2 Device: Nasal Cannula O2 Flow Rate: O2 Flow Rate (L/min): 2 L/min  Intake/output summary:   Intake/Output Summary (Last 24 hours) at 01/15/2018 1416 Last data filed at 01/14/2018 1422 Gross per 24 hour  Intake 240 ml  Output -  Net 240 ml   LBM: Last BM Date: 01/14/18 Baseline Weight: Weight: 68 kg (150 lb) Most recent weight: Weight: 70.8 kg (156 lb 1.4 oz)       Palliative Assessment/Data: PPS 10%    Flowsheet Rows     Most Recent Value  Intake Tab  Referral Department  Hospitalist  Unit at Time of Referral  Med/Surg Unit  Palliative Care Primary Diagnosis  Sepsis/Infectious Disease  Date Notified  01/05/2018  Palliative Care Type  New Palliative care  Reason for referral  Clarify Goals of Care  Date of Admission  12/13/2017  Date first seen by Palliative Care  01/09/2018  # of days Palliative referral response time  0 Day(s)  # of days IP prior to Palliative referral  0  Clinical Assessment  Palliative Performance Scale Score  20%  Psychosocial & Spiritual Assessment  Palliative Care Outcomes  Patient/Family meeting held?  Yes  Who was at the meeting?  daughter and grandson  Palliative Care Outcomes  Clarified goals of care, Provided psychosocial or spiritual support      Patient Active Problem List   Diagnosis Date Noted  . HCAP (healthcare-associated pneumonia)   . Failure to thrive in adult   . Palliative care by specialist   . Goals of care, counseling/discussion   . PNA (pneumonia) 01/13/2018  . Pneumonia 12/31/2017  . Protein-calorie malnutrition, severe 12/14/2017  . Physical deconditioning   . CHF  (congestive heart failure) ([place]) 12/27/2017  . Fluid overload 12/26/2017  . NSTEMI (non-ST elevated myocardial infarction) ([place]) 12/20/2017  . ESRD (end stage renal disease) ([place]) 12/19/2017  . Atrial fibrillation ([place]) 12/19/2017  . Pressure injury of skin 12/02/2017  . Renal failure (ARF), acute on chronic (HCC) 12/01/2017  . Unstable angina ([place]) 10/31/2017  . Elevated troponin 10/30/2017  . Chronic systolic CHF (congestive heart failure) ([place]) 10/30/2017  . Acute posthemorrhagic anemia   . GERD (gastroesophageal reflux disease)   . PAD (peripheral artery disease) ([place]) 06/04/2016  . Colon polyp 06/29/2015  . Malignant neoplasm of prostate ([place]) 11/14/2014  . HLD (hyperlipidemia) 07/29/2014  . Long term current use of antithrombotics/antiplatelets 01/27/2014  . Arteriosclerosis of coronary artery 01/27/2014  . Carpal tunnel syndrome 12/11/2013  . Benign essential HTN 12/11/2013  . Lesion of ulnar nerve 12/11/2013  . Disorder of mitral valve 12/11/2013  . Arthritis, degenerative 12/11/2013  . Hereditary and idiopathic neuropathy 12/11/2013    Palliative Care Assessment & Plan   HPI: 84 y.o.malewith past medical history of ESRD HD MWF beginning ~1.5 mo ago, hypotension requiring midodrine, atrial fibrillation, CAD, mitral valve regurgitation, combined systolic and grade II diastolic heart failure EF 45-50%, lung nodule, prostate canceradmitted on5/31/2019with loss of consciousness during dialysis, intermittent N/V, belching, speech changes x 4 days.Admitted to r/o stroke, treating for HCAP, and continuing dialysis.PMT consulted for [place].  Assessment: Today patient is being transferred to ICU - pt could not tolerate HD and BP dropped. Pt having episodes of severe hypoglycemia.  The patient appears less responsive than yesterday.    I met at the bedside with patient's daughter, Andrew [name]. I tell Andrew [name] that I am very concerned about her father and it appears he is declining - she  disagrees and believes he will improve. She tells me how his pneumonia is getting better and WBC is decreasing. We discussed that some of the numbers do look better but the patient does not look better.Wilburn Mylar we discussed risks and benefits of temporary feeding tube and she wanted to wait for a day before NG placement. Today she tells me that she would like NG placed for nutrition. We discussed how this is a short term fix. She believes that the patient will improve once he receives nutrition. I explained to her that this decision is up to patient's attending physician.   I again shared my concerns that patient's body is failing - discussed hypotension, unresponsiveness, and hypoglycemia. She tells me she wants full aggressive care.  We discussed that at some point we may need to shift our focus to his comfort.    Recommendations/Plan:  FULL code/FULL scope treatment  Daughter requesting feeding tube placement - I shared the daughter's wishes with Dr. Posey Pronto  Goals of Care and Additional Recommendations:  Limitations on Scope of Treatment: Full Scope Treatment  Code Status:  Full code  Prognosis:   Unable to determine very poor prognosis with severe deconditioning and comorbidities  Discharge Planning:  To Be Determined  Care plan was discussed with Dr. Posey Pronto, patient's daughter, bedside RN Elita Quick  Thank you for allowing the Palliative Medicine Team to assist in the care of this patient.   Total Time 40 minutes Prolonged Time Billed  no       Greater than 50%  of this time was spent counseling and coordinating care related to the above assessment and plan.  Juel Burrow, DNP, AGNP-C Palliative Medicine Team Team Phone # [phone]

## 2018-01-15 NOTE — Progress Notes (Signed)
Pt very agitated, has on mittens but fighting and saying "let me go", MD notified will order ativan for duration of Tx.

## 2018-01-15 NOTE — Progress Notes (Signed)
Hypoglycemic Event  CBG: <20  Treatment: D50 IV 25 mL  Symptoms: Sweaty and Shaky  Follow-up CBG: Time:1121 CBG Result:89  Possible Reasons for Event: Inadequate meal intake  Comments/MD notified: yes     [name]

## 2018-01-15 NOTE — Progress Notes (Signed)
[facility]  ROUNDING NOTE   Subjective:   Hemodialysis had to be stopped after 2.5 hours. Hypotensive, hypoglycemia and altered mental status.   No ultrafiltration.   Objective:  Vital signs in last 24 hours:  Temp:  [98 F (36.7 C)-99.1 F (37.3 C)] 98 F (36.7 C) (06/05 0847) Pulse Rate:  [89-98] 98 (06/05 0900) Resp:  [14-27] 21 (06/05 1100) BP: (44-164)/(31-126) 160/124 (06/05 1100) SpO2:  [98 %-100 %] 98 % (06/05 0900)  Weight change:  Filed Weights   01/07/2018 0828 01/13/18 0845 01/13/18 1251  Weight: 68 kg (150 lb) 71.2 kg (156 lb 15.5 oz) 70.8 kg (156 lb 1.4 oz)    Intake/Output: I/O last 3 completed shifts: In: 240 [P.O.:240] Out: -    Intake/Output this shift:  No intake/output data recorded.  Physical Exam: General: Lethargic, laying in bed  Head: Normocephalic, atraumatic. Dry oral mucosal membranes  Eyes: Anicteric,   Neck: Supple, trachea midline  Lungs:  clear  Heart: No rub S1S2  Abdomen:  Soft, nontender, BS present  Extremities: + peripheral edema.   Neurologic: Lethargic, moving all four extremities   Skin: Warm, dry, no rash  Access:  Right internal jugular permcath    Basic Metabolic Panel: Recent Labs  Lab 12/25/2017 0935 12/19/2017 1759 01/14/18 0609 01/15/18 0932  NA 138  --  141 140  K 3.3*  --  3.5 3.7  CL 101  --  102 100*  CO2 24  --  27 25  GLUCOSE 108*  --  37* <20*  BUN 31*  --  25* 39*  CREATININE 3.64*  --  3.05* 4.25*  CALCIUM 8.0*  --  7.7* 7.9*  MG 1.9  --   --   --   PHOS 2.8 3.0 3.0  --     Liver Function Tests: Recent Labs  Lab 12/30/2017 0935 01/13/18 1719 01/14/18 0609  AST 300* 186*  --   ALT 201* 152*  --   ALKPHOS 150* 102  --   BILITOT 3.5* 3.4*  --   PROT 5.1* 4.7*  --   ALBUMIN 2.2* 2.2* 1.9*   No results for input(s): LIPASE, AMYLASE in the last 168 hours. No results for input(s): AMMONIA in the last 168 hours.  CBC: Recent Labs  Lab 12/22/2017 0838 01/13/18 1011  01/14/18 0609 01/15/18 0932  WBC 19.8* 16.9* 15.6* 13.7*  NEUTROABS  --  15.4* 13.4*  --   HGB 11.7* 9.5* 8.8* 9.0*  HCT 36.9* 30.0* 27.2* 28.2*  MCV 74.6* 76.0* 76.1* 76.6*  PLT 272 211 165 158    Cardiac Enzymes: Recent Labs  Lab 12/30/2017 0935 12/30/2017 1245 01/02/2018 1759  TROPONINI 0.63* 0.57* 0.56*    BNP: Invalid input(s): POCBNP  CBG: Recent Labs  Lab 01/14/18 0651 01/14/18 0657 01/15/18 1132 01/15/18 1135  GLUCAP 28* 173* <10* 47    Microbiology: Results for orders placed or performed during the hospital encounter of 12/20/2017  Blood Culture (routine x 2)     Status: None   Collection Time: 01/06/2018 10:06 AM  Result Value Ref Range Status   Specimen Description BLOOD LEFT ARM  Final   Special Requests   Final    BOTTLES DRAWN AEROBIC AND ANAEROBIC Blood Culture results may not be optimal due to an inadequate volume of blood received in culture bottles   Culture   Final    NO GROWTH 5 DAYS Performed at [facility], [address], [place],  [address]  Report Status 01/15/2018 FINAL  Final  Blood Culture (routine x 2)     Status: None   Collection Time: 12/19/2017 10:11 AM  Result Value Ref Range Status   Specimen Description BLOOD LEFT ARM  Final   Special Requests   Final    BOTTLES DRAWN AEROBIC AND ANAEROBIC Blood Culture results may not be optimal due to an inadequate volume of blood received in culture bottles   Culture   Final    NO GROWTH 5 DAYS Performed at [facility], [address] [place], [place] [address]    Report Status 01/15/2018 FINAL  Final  MRSA PCR Screening     Status: None   Collection Time: 01/04/2018  2:30 PM  Result Value Ref Range Status   MRSA by PCR NEGATIVE NEGATIVE Final    Comment:        The GeneXpert MRSA Assay (FDA approved for NASAL specimens only), is one component of a comprehensive MRSA colonization surveillance program. It is not intended to diagnose MRSA infection nor to guide  or monitor treatment for MRSA infections. Performed at [facility], [address] [place],  [address]     Coagulation Studies: Recent Labs    01/13/18 1719  LABPROT 15.0  INR 1.19    Urinalysis: No results for input(s): COLORURINE, LABSPEC, PHURINE, GLUCOSEU, HGBUR, BILIRUBINUR, KETONESUR, PROTEINUR, UROBILINOGEN, NITRITE, LEUKOCYTESUR in the last 72 hours.  Invalid input(s): APPERANCEUR    Imaging: Dg Chest Port 1 View  Result Date: 01/13/2018 CLINICAL DATA:  Pneumonia. History of prostate cancer, hypertension and mitral valve disorder. EXAM: PORTABLE CHEST 1 VIEW COMPARISON:  Radiographs 12/18/2017 and 12/26/2017. FINDINGS: 1452 hours. Right IJ hemodialysis catheters are unchanged near the superior cavoatrial junction. The heart size and mediastinal contours are stable. There are increased pleural effusions and right-greater-than-left basilar airspace opacities. There is probable mild edema. No pneumothorax. The bones appear unchanged. IMPRESSION: Worsening pleural effusions, bibasilar pulmonary opacities and probable edema. Electronically Signed   By: Richardean Sale M.D.   On: 01/13/2018 15:15     Medications:   . albumin human    . ampicillin-sulbactam (UNASYN) IV Stopped (01/15/18 0438)  . dextrose     . amiodarone  200 mg Oral Daily  . aspirin EC  81 mg Oral Daily  . atorvastatin  40 mg Oral q1800  . Chlorhexidine Gluconate Cloth  6 each Topical Q0600  . collagenase   Topical Daily  . feeding supplement (NEPRO CARB STEADY)  237 mL Oral TID BM  . finasteride  5 mg Oral Daily  . fluticasone  2 spray Each Nare Daily  . heparin injection (subcutaneous)  5,000 Units Subcutaneous Q12H  . lidocaine  15 mL Mouth/Throat Q6H  . magic mouthwash  15 mL Oral QID  . megestrol  200 mg Oral QHS  . midodrine  20 mg Oral TID WC  . mirtazapine  7.5 mg Oral QHS  . multivitamin  1 tablet Oral QHS  . pantoprazole  40 mg Oral Daily   acetaminophen, benzonatate,  senna-docusate, sodium chloride  Assessment/ Plan:  [name] is a 84 y.o. black male with hypertension, hyperlipidemia, gout, ? history of prostate cancer, coronary artery disease, who was admitted to [facility] on 12/01/2017 for heart failure.  Admitted 01/09/2018 for hypoxia, AMS likely due to aspiration.    [facility] MWF RIJ permcath  1. End Stage Renal disease: acute renal failure with limited recovery.  Hypotensive and with volume overload and hypoglycemia.  Did  not get IV albumin with hemodialysis treatment  Recommend transferring to ICU and giving vasopressors and then trial of dialysis with ultrafiltration.   2.  Right lower lobe pneumonia. With leukocytosis 13.7  -  Unasyn.  3. Hypotension: with diastolic congestive heart failure. With anasarca on examination.  - midodrine   4.  Anemia of chronic kidney disease: hemoglobin 9 - EPO with next hemodialysis treatment  5. Secondary Hyperparathyroidism: not currently on binders. Unable to tolerate PO right now.    LOS: 2 Rosi Secrist 6/5/201911:49 AM

## 2018-01-15 NOTE — Progress Notes (Signed)
RN try to give meds to pt this morning and pt would not swallow them. He only pockets them on his mouth, but do not swallow them.

## 2018-01-15 NOTE — Progress Notes (Signed)
100 ml NS given for asymptomatic hypotension, this is chronic for pt. Pt is stable and awaiting smaller cuff for better accuracy of b/p. MD aware.

## 2018-01-15 NOTE — Consult Note (Addendum)
Reason for Consult:hypoglycemia, hypotension, pneumonia Referring Physician: Dr. Neil Crouch is an 84 y.o. male.  HPI: Mr. Hietala is an 84 year old gentleman with a past medical history remarkable for hypertension, coronary artery disease, atrial fibrillation, hyperlipidemia, prostate cancer, valvular disease, end-stage renal disease on hemodialysis, was initially admitted after having difficulties over hemodialysis. He had nausea, vomiting, belching, change in mental status with choking and possible aspiration. He was subsequently admitted to the hospital. Unfortunately today hemodialysis was stopped after 2.5 hours secondary to hypotension, hypoglycemia and altered mental status. Patient has arrived in the intensive care unit, is encephalopathic, thus far blood pressures have remained stable, has been repeatedly hypoglycemic, given an amp of D50 and started on a D10 drip.  Past Medical History:  Diagnosis Date  . Arrhythmia    atrial fibrillation  . Arteriosclerosis of coronary artery 01/27/2014  . Benign essential HTN 12/11/2013  . Carpal tunnel syndrome   . Chronic kidney disease   . Coronary atherosclerosis of native coronary artery   . Disorder of mitral valve 12/11/2013  . Encounter for long-term (current) use of antiplatelets/antithrombotics   . Gout   . Hyperlipidemia   . Hypertension   . Lung nodule    found on xray in Aug 2018  . Mitral valve disorder   . Osteoarthritis   . Prostate cancer [facility])     Past Surgical History:  Procedure Laterality Date  . COLONOSCOPY    . COLONOSCOPY WITH PROPOFOL N/A 07/23/2016   Procedure: COLONOSCOPY WITH PROPOFOL;  Surgeon: Manya Silvas, MD;  Location: [facility] ENDOSCOPY;  Service: Endoscopy;  Laterality: N/A;  . CORONARY ANGIOPLASTY WITH STENT PLACEMENT    . DIALYSIS/PERMA CATHETER INSERTION N/A 12/09/2017   Procedure: DIALYSIS/PERMA CATHETER INSERTION;  Surgeon: Algernon Huxley, MD;  Location: [place] CV LAB;  Service:  Cardiovascular;  Laterality: N/A;  . DIALYSIS/PERMA CATHETER INSERTION N/A 12/16/2017   Procedure: DIALYSIS/PERMA CATHETER INSERTION;  Surgeon: Algernon Huxley, MD;  Location: [place] CV LAB;  Service: Cardiovascular;  Laterality: N/A;  . ESOPHAGOGASTRODUODENOSCOPY Left 07/28/2017   Procedure: ESOPHAGOGASTRODUODENOSCOPY (EGD);  Surgeon: Virgel Manifold, MD;  Location[facility] ENDOSCOPY;  Service: Endoscopy;  Laterality: Left;  . ESOPHAGOGASTRODUODENOSCOPY (EGD) WITH PROPOFOL N/A 07/23/2016   Procedure: ESOPHAGOGASTRODUODENOSCOPY (EGD) WITH PROPOFOL;  Surgeon: Manya Silvas, MD;  Location: [facility] ENDOSCOPY;  Service: Endoscopy;  Laterality: N/A;  . PROSTATE BIOPSY      Family History  Problem Relation Age of Onset  . Cancer Mother        stomach and uterus  . Cancer Sister        breast    Social History:  reports that he has never smoked. He has never used smokeless tobacco. He reports that he does not drink alcohol or use drugs.  Allergies: No Known Allergies  Medications: I have reviewed the patient's current medications.  Results for orders placed or performed during the hospital encounter of 12/22/2017 (from the past 48 hour(s))  Hepatic function panel     Status: Abnormal   Collection Time: 01/13/18  5:19 PM  Result Value Ref Range   Total Protein 4.7 (L) 6.5 - 8.1 g/dL   Albumin 2.2 (L) 3.5 - 5.0 g/dL   AST 186 (H) 15 - 41 U/L   ALT 152 (H) 17 - 63 U/L   Alkaline Phosphatase 102 38 - 126 U/L   Total Bilirubin 3.4 (H) 0.3 - 1.2 mg/dL   Bilirubin, Direct 1.7 (H) 0.1 - 0.5 mg/dL  Indirect Bilirubin 1.7 (H) 0.3 - 0.9 mg/dL    Comment: Performed at [facility], [address] [place], [place] [address]  Protime-INR     Status: None   Collection Time: 01/13/18  5:19 PM  Result Value Ref Range   Prothrombin Time 15.0 11.4 - 15.2 seconds   INR 1.19     Comment: Performed at [facility], [address] [place], [place] [address]  APTT     Status:  None   Collection Time: 01/13/18  5:19 PM  Result Value Ref Range   aPTT 34 24 - 36 seconds    Comment: Performed at [facility], [place], [place] [address]  CBC with Differential/Platelet     Status: Abnormal   Collection Time: 01/14/18  6:09 AM  Result Value Ref Range   WBC 15.6 (H) 3.8 - 10.6 K/uL   RBC 3.57 (L) 4.40 - 5.90 MIL/uL   Hemoglobin 8.8 (L) 13.0 - 18.0 g/dL   HCT 27.2 (L) 40.0 - 52.0 %   MCV 76.1 (L) 80.0 - 100.0 fL   MCH 24.5 (L) 26.0 - 34.0 pg   MCHC 32.3 32.0 - 36.0 g/dL   RDW 36.9 (H) 11.5 - 14.5 %   Platelets 165 150 - 440 K/uL    Comment: COUNT MAY BE INACCURATE DUE TO FIBRIN CLUMPS.   Neutrophils Relative % 85 %   Neutro Abs 13.4 (H) 1.4 - 6.5 K/uL   Lymphocytes Relative 8 %   Lymphs Abs 1.2 1.0 - 3.6 K/uL   Monocytes Relative 6 %   Monocytes Absolute 0.9 0.2 - 1.0 K/uL   Eosinophils Relative 1 %   Eosinophils Absolute 0.1 0 - 0.7 K/uL   Basophils Relative 0 %   Basophils Absolute 0.0 0 - 0.1 K/uL    Comment: Performed at [facility], [address] [place], [place] [address]  Renal function panel     Status: Abnormal   Collection Time: 01/14/18  6:09 AM  Result Value Ref Range   Sodium 141 135 - 145 mmol/L   Potassium 3.5 3.5 - 5.1 mmol/L   Chloride 102 101 - 111 mmol/L   CO2 27 22 - 32 mmol/L   Glucose, Bld 37 (LL) 65 - 99 mg/dL    Comment: CRITICAL RESULT CALLED TO, READ BACK BY AND VERIFIED WITH LIZ HAYS@0649  ON 01/14/18 BY HKP    BUN 25 (H) 6 - 20 mg/dL   Creatinine, Ser 3.05 (H) 0.61 - 1.24 mg/dL   Calcium 7.7 (L) 8.9 - 10.3 mg/dL   Phosphorus 3.0 2.5 - 4.6 mg/dL   Albumin 1.9 (L) 3.5 - 5.0 g/dL   GFR calc non Af Amer 18 (L) >60 mL/min   GFR calc Af Amer 20 (L) >60 mL/min    Comment: (NOTE) The eGFR has been calculated using the CKD EPI equation. This calculation has not been validated in all clinical situations. eGFR's persistently <60 mL/min signify possible Chronic Kidney Disease.    Anion gap 12  5 - 15    Comment: Performed at [facility], [address] [place], [place] [address]  Glucose, capillary     Status: Abnormal   Collection Time: 01/14/18  6:51 AM  Result Value Ref Range   Glucose-Capillary 28 (LL) 65 - 99 mg/dL   Comment 1 Notify RN   Glucose, capillary     Status: Abnormal   Collection Time: 01/14/18  6:57 AM  Result Value Ref Range  Glucose-Capillary 173 (H) 65 - 99 mg/dL  Procalcitonin - Baseline     Status: None   Collection Time: 01/14/18 10:21 AM  Result Value Ref Range   Procalcitonin 3.29 ng/mL    Comment:        Interpretation: PCT > 2 ng/mL: Systemic infection (sepsis) is likely, unless other causes are known. (NOTE)       Sepsis PCT Algorithm           Lower Respiratory Tract                                      Infection PCT Algorithm    ----------------------------     ----------------------------         PCT < 0.25 ng/mL                PCT < 0.10 ng/mL         Strongly encourage             Strongly discourage   discontinuation of antibiotics    initiation of antibiotics    ----------------------------     -----------------------------       PCT 0.25 - 0.50 ng/mL            PCT 0.10 - 0.25 ng/mL               OR       >80% decrease in PCT            Discourage initiation of                                            antibiotics      Encourage discontinuation           of antibiotics    ----------------------------     -----------------------------         PCT >= 0.50 ng/mL              PCT 0.26 - 0.50 ng/mL               AND       <80% decrease in PCT              Encourage initiation of                                             antibiotics       Encourage continuation           of antibiotics    ----------------------------     -----------------------------        PCT >= 0.50 ng/mL                  PCT > 0.50 ng/mL               AND         increase in PCT                  Strongly encourage  initiation of antibiotics    Strongly encourage escalation           of antibiotics                                     -----------------------------                                           PCT <= 0.25 ng/mL                                                 OR                                        > 80% decrease in PCT                                     Discontinue / Do not initiate                                             antibiotics Performed at [facility], [address] [place], [place] [address]   CBC     Status: Abnormal   Collection Time: 01/15/18  9:32 AM  Result Value Ref Range   WBC 13.7 (H) 3.8 - 10.6 K/uL   RBC 3.69 (L) 4.40 - 5.90 MIL/uL   Hemoglobin 9.0 (L) 13.0 - 18.0 g/dL   HCT 28.2 (L) 40.0 - 52.0 %   MCV 76.6 (L) 80.0 - 100.0 fL   MCH 24.4 (L) 26.0 - 34.0 pg   MCHC 31.9 (L) 32.0 - 36.0 g/dL   RDW 37.5 (H) 11.5 - 14.5 %   Platelets 158 150 - 440 K/uL    Comment: PLATELET COUNT CONFIRMED BY SMEAR Performed at [facility], [address] [place], [place] [address]   Basic metabolic panel     Status: Abnormal   Collection Time: 01/15/18  9:32 AM  Result Value Ref Range   Sodium 140 135 - 145 mmol/L   Potassium 3.7 3.5 - 5.1 mmol/L   Chloride 100 (L) 101 - 111 mmol/L   CO2 25 22 - 32 mmol/L   Glucose, Bld <20 (LL) 65 - 99 mg/dL    Comment: CRITICAL RESULT CALLED TO, READ BACK BY AND VERIFIED WITH JUAN RODRIGUEZ AT 1113 ON 01/15/2018 JJB    BUN 39 (H) 6 - 20 mg/dL   Creatinine, Ser 4.25 (H) 0.61 - 1.24 mg/dL   Calcium 7.9 (L) 8.9 - 10.3 mg/dL   GFR calc non Af Amer 12 (L) >60 mL/min   GFR calc Af Amer 14 (L) >60 mL/min    Comment: (NOTE) The eGFR has been calculated using the CKD EPI equation. This calculation has not been validated in all clinical situations. eGFR's persistently <60 mL/min signify possible Chronic Kidney Disease.    Anion gap 15 5 - 15  Comment: Performed at [facility], [address] [place], [place] [address]  Glucose, capillary     Status: Abnormal   Collection Time: 01/15/18 11:32 AM  Result Value Ref Range   Glucose-Capillary <10 (LL) 65 - 99 mg/dL  Glucose, capillary     Status: None   Collection Time: 01/15/18 11:35 AM  Result Value Ref Range   Glucose-Capillary 89 65 - 99 mg/dL  Glucose, capillary     Status: Abnormal   Collection Time: 01/15/18  2:05 PM  Result Value Ref Range   Glucose-Capillary <10 (LL) 65 - 99 mg/dL  Glucose, capillary     Status: Abnormal   Collection Time: 01/15/18  2:07 PM  Result Value Ref Range   Glucose-Capillary <10 (LL) 65 - 99 mg/dL    Dg Chest Port 1 View  Result Date: 01/13/2018 CLINICAL DATA:  Pneumonia. History of prostate cancer, hypertension and mitral valve disorder. EXAM: PORTABLE CHEST 1 VIEW COMPARISON:  Radiographs 01/08/2018 and 12/26/2017. FINDINGS: 1452 hours. Right IJ hemodialysis catheters are unchanged near the superior cavoatrial junction. The heart size and mediastinal contours are stable. There are increased pleural effusions and right-greater-than-left basilar airspace opacities. There is probable mild edema. No pneumothorax. The bones appear unchanged. IMPRESSION: Worsening pleural effusions, bibasilar pulmonary opacities and probable edema. Electronically Signed   By: Richardean Sale M.D.   On: 01/13/2018 15:15    ROS Blood pressure (!) 141/87, pulse 99, temperature 98.1 F (36.7 C), resp. rate 20, height 5' 9"  (1.753 m), weight 156 lb 1.4 oz (70.8 kg), SpO2 91 %. Physical Exam Patient is confused, moaning, moving around head and legs, encephalopathic Vital signs: Please see the above listed vital signs HEENT: Trachea is midline, no accessory muscle utilization, on nasal cannula, no distended neck veins, appears to have a lesion on the lateral aspect of his tongue will need to be addressed at some point Cardiovascular: Regular rhythm, systolic murmur mitral post Pulmonary: Crackles appreciated right greater  than left, vascular access noted Abdominal: Positive bowel sounds, soft exam Extremity: No clubbing cyanosis or edema noted Neurologic: Patient moves all extremities,is confused, shaking head, encephalopathic Cutaneous: No rashes or lesions noted  Assessment/Plan:  End-stage renal failure. On hemodialysis, had to be stopped after 2-1/2 hours today secondary to hypotension and hypoglycemia along with altered mental status. Patient was moved to the intensive care unit for closer monitoring and therapeutics  Hypoglycemia. May be related to sepsis, will also send off a cortisol and TSH. Patient has been given D50 and is on a D10 drip will increase to 75 mL an hour  Fluctuating hemodynamics. Will obtain central access both for blood draw and possible pressors if required  Sepsis. Felt to be secondary to pneumonia, on Unasyn, monitor closely, support hemodynamics pending culture data  Leukocytosis Has improved on antibiotic therapy most recent is 13.7  Anemia. Stable no evidence of active bleeding  Tongue lesion. Difficult to assess, will need ENT eventually to evaluate and possible biopsy  Critical care time 30 minutes  Keeana Pieratt 01/15/2018, 2:18 PM

## 2018-01-15 NOTE — Progress Notes (Signed)
Late entry, tx ended by order of MD, RN came to report critical BS of <20, pt tx stopped and pt given D5. Subsequent transfer to ICU w/ post admin BS of 89.    01/15/18 1115  Hand-Off documentation  Report given to (Full Name) Lum Babe, RN  Report received from (Full Name) Beatris Ship, RN   Vital Signs  Temp 98.1 F (36.7 C)  Temp Source Axillary  Pulse Rate 96  Pulse Rate Source Monitor  Resp (!) 26  BP (!) 63/44  BP Location Left Arm  BP Method Automatic  Patient Position (if appropriate) Lying  Oxygen Therapy  SpO2 97 %  O2 Device Nasal Cannula  O2 Flow Rate (L/min) 2 L/min  Pulse Oximetry Type Continuous  During Hemodialysis Assessment  HD Safety Checks Performed Yes  Intra-Hemodialysis Comments Tx completed;See progress note  Post-Hemodialysis Assessment  Rinseback Volume (mL) 250 mL  Dialyzer Clearance Clear  Duration of HD Treatment -hour(s) 2.25 hour(s)  Hemodialysis Intake (mL) 700 mL  UF Total -Machine (mL) 937 mL  Net UF (mL) 237 mL  Tolerated HD Treatment Yes  Hemodialysis Catheter Right Internal jugular Double-lumen  Placement Date/Time: 12/16/17 1710   Time Out: Correct patient;Correct site;Correct procedure  Maximum sterile barrier precautions: Hand hygiene;Cap;Mask;Sterile gown;Sterile gloves;Large sterile sheet  Site Prep: Chlorhexidine  Orientation: Right  Ac...  Site Condition No complications  Blue Lumen Status Heparin locked  Red Lumen Status Heparin locked  Dressing Type Biopatch;Occlusive  Dressing Status Clean;Dry;Intact  Post treatment catheter status Capped and Clamped

## 2018-01-15 NOTE — Progress Notes (Signed)
Report given to Sarah RN from ICU.

## 2018-01-15 NOTE — Progress Notes (Signed)
Patient ID: Andrew [name], male   DOB: Feb 04, 1933, 84 y.o.   MRN: [id number] Pulmonary/critical care attending  Procedure note Central line placement Indication: Hypotension Consent is obtained from family Complete contact precautions utilized Topical Hibiclens use Subdermal lidocaine Right femoral location chosen and perform an ultrasound guidance. Patient is moving around head encephalopathic and confused so stayed away from the internal jugular approach Under ultrasound guidance the right femoral vein was cannulated on first pass without difficulty Guidewire was placed and needle was removed Using Seldinger technique a triple-lumen catheter was placed Dark nonpulsatile venous return All 3 ports flushed Sutured into place Dressed by nurse  Hermelinda Dellen, D.O.

## 2018-01-16 ENCOUNTER — Inpatient Hospital Stay: Payer: Medicare Other

## 2018-01-16 LAB — CBC WITH DIFFERENTIAL/PLATELET
BASOS PCT: 0 %
Basophils Absolute: 0 10*3/uL (ref 0–0.1)
EOS ABS: 0 10*3/uL (ref 0–0.7)
EOS PCT: 0 %
HCT: 28.8 % — ABNORMAL LOW (ref 40.0–52.0)
HEMOGLOBIN: 9.2 g/dL — AB (ref 13.0–18.0)
LYMPHS ABS: 0.6 10*3/uL — AB (ref 1.0–3.6)
Lymphocytes Relative: 5 %
MCH: 24.9 pg — AB (ref 26.0–34.0)
MCHC: 32.1 g/dL (ref 32.0–36.0)
MCV: 77.7 fL — ABNORMAL LOW (ref 80.0–100.0)
MONO ABS: 0.4 10*3/uL (ref 0.2–1.0)
MONOS PCT: 3 %
Neutro Abs: 10.4 10*3/uL — ABNORMAL HIGH (ref 1.4–6.5)
Neutrophils Relative %: 92 %
PLATELETS: 146 10*3/uL — AB (ref 150–440)
RBC: 3.71 MIL/uL — ABNORMAL LOW (ref 4.40–5.90)
RDW: 36.7 % — AB (ref 11.5–14.5)
WBC: 11.4 10*3/uL — ABNORMAL HIGH (ref 3.8–10.6)

## 2018-01-16 LAB — RENAL FUNCTION PANEL
ALBUMIN: 2 g/dL — AB (ref 3.5–5.0)
ALBUMIN: 1.8 g/dL — AB (ref 3.5–5.0)
ANION GAP: 16 — AB (ref 5–15)
ANION GAP: 14 (ref 5–15)
Albumin: 1.7 g/dL — ABNORMAL LOW (ref 3.5–5.0)
Albumin: 1.8 g/dL — ABNORMAL LOW (ref 3.5–5.0)
Anion gap: 16 — ABNORMAL HIGH (ref 5–15)
Anion gap: 15 (ref 5–15)
BUN: 36 mg/dL — ABNORMAL HIGH (ref 6–20)
BUN: 29 mg/dL — ABNORMAL HIGH (ref 6–20)
BUN: 26 mg/dL — AB (ref 6–20)
BUN: 26 mg/dL — AB (ref 6–20)
CALCIUM: 7.5 mg/dL — AB (ref 8.9–10.3)
CALCIUM: 7.9 mg/dL — AB (ref 8.9–10.3)
CHLORIDE: 101 mmol/L (ref 101–111)
CO2: 20 mmol/L — ABNORMAL LOW (ref 22–32)
CO2: 22 mmol/L (ref 22–32)
CO2: 21 mmol/L — ABNORMAL LOW (ref 22–32)
CO2: 20 mmol/L — AB (ref 22–32)
CREATININE: 3.33 mg/dL — AB (ref 0.61–1.24)
CREATININE: 2.15 mg/dL — AB (ref 0.61–1.24)
CREATININE: 2.26 mg/dL — AB (ref 0.61–1.24)
Calcium: 8 mg/dL — ABNORMAL LOW (ref 8.9–10.3)
Calcium: 7.8 mg/dL — ABNORMAL LOW (ref 8.9–10.3)
Chloride: 102 mmol/L (ref 101–111)
Chloride: 101 mmol/L (ref 101–111)
Chloride: 102 mmol/L (ref 101–111)
Creatinine, Ser: 2.67 mg/dL — ABNORMAL HIGH (ref 0.61–1.24)
GFR calc Af Amer: 24 mL/min — ABNORMAL LOW (ref >60)
GFR calc Af Amer: 31 mL/min — ABNORMAL LOW (ref >60)
GFR calc Af Amer: 29 mL/min — ABNORMAL LOW (ref >60)
GFR calc non Af Amer: 21 mL/min — ABNORMAL LOW (ref >60)
GFR calc non Af Amer: 27 mL/min — ABNORMAL LOW (ref >60)
GFR calc non Af Amer: 25 mL/min — ABNORMAL LOW (ref >60)
GFR, EST AFRICAN AMERICAN: 18 mL/min — AB (ref >60)
GFR, EST NON AFRICAN AMERICAN: 16 mL/min — AB (ref >60)
GLUCOSE: 168 mg/dL — AB (ref 65–99)
GLUCOSE: 162 mg/dL — AB (ref 65–99)
Glucose, Bld: 232 mg/dL — ABNORMAL HIGH (ref 65–99)
Glucose, Bld: 200 mg/dL — ABNORMAL HIGH (ref 65–99)
PHOSPHORUS: 4.1 mg/dL (ref 2.5–4.6)
PHOSPHORUS: 3.9 mg/dL (ref 2.5–4.6)
POTASSIUM: 4.3 mmol/L (ref 3.5–5.1)
Phosphorus: 4.7 mg/dL — ABNORMAL HIGH (ref 2.5–4.6)
Phosphorus: 3.5 mg/dL (ref 2.5–4.6)
Potassium: 4.6 mmol/L (ref 3.5–5.1)
Potassium: 5.2 mmol/L — ABNORMAL HIGH (ref 3.5–5.1)
Potassium: 4.2 mmol/L (ref 3.5–5.1)
SODIUM: 138 mmol/L (ref 135–145)
SODIUM: 138 mmol/L (ref 135–145)
Sodium: 137 mmol/L (ref 135–145)
Sodium: 137 mmol/L (ref 135–145)

## 2018-01-16 LAB — GLUCOSE, CAPILLARY
GLUCOSE-CAPILLARY: 314 mg/dL — AB (ref 65–99)
GLUCOSE-CAPILLARY: 303 mg/dL — AB (ref 65–99)
GLUCOSE-CAPILLARY: 142 mg/dL — AB (ref 65–99)
Glucose-Capillary: 119 mg/dL — ABNORMAL HIGH (ref 65–99)
Glucose-Capillary: 141 mg/dL — ABNORMAL HIGH (ref 65–99)
Glucose-Capillary: 142 mg/dL — ABNORMAL HIGH (ref 65–99)
Glucose-Capillary: 255 mg/dL — ABNORMAL HIGH (ref 65–99)
Glucose-Capillary: 272 mg/dL — ABNORMAL HIGH (ref 65–99)
Glucose-Capillary: <10 mg/dL — CL (ref 65–99)
Glucose-Capillary: <10 mg/dL — CL (ref 65–99)

## 2018-01-16 LAB — MAGNESIUM
MAGNESIUM: 1.8 mg/dL (ref 1.7–2.4)
Magnesium: 1.8 mg/dL (ref 1.7–2.4)
Magnesium: 1.8 mg/dL (ref 1.7–2.4)
Magnesium: 1.7 mg/dL (ref 1.7–2.4)

## 2018-01-16 LAB — HEPATITIS B SURFACE ANTIGEN: HEP B S AG: NEGATIVE

## 2018-01-16 LAB — BASIC METABOLIC PANEL
Anion gap: 14 (ref 5–15)
BUN: 32 mg/dL — AB (ref 6–20)
CALCIUM: 7.5 mg/dL — AB (ref 8.9–10.3)
CHLORIDE: 99 mmol/L — AB (ref 101–111)
CO2: 22 mmol/L (ref 22–32)
CREATININE: 3.24 mg/dL — AB (ref 0.61–1.24)
GFR calc Af Amer: 19 mL/min — ABNORMAL LOW (ref >60)
GFR calc non Af Amer: 16 mL/min — ABNORMAL LOW (ref >60)
Glucose, Bld: 322 mg/dL — ABNORMAL HIGH (ref 65–99)
Potassium: 4.8 mmol/L (ref 3.5–5.1)
SODIUM: 135 mmol/L (ref 135–145)

## 2018-01-16 LAB — THYROID PANEL WITH TSH
Free Thyroxine Index: 2.3 (ref 1.2–4.9)
T3 UPTAKE RATIO: 40 % — AB (ref 24–39)
T4 TOTAL: 5.8 ug/dL (ref 4.5–12.0)
TSH: 8.76 u[IU]/mL — ABNORMAL HIGH (ref 0.450–4.500)

## 2018-01-16 MED ORDER — CHLORHEXIDINE GLUCONATE 0.12 % MT SOLN
15.0000 mL | Freq: Two times a day (BID) | OROMUCOSAL | Status: DC
  Administered 2018-01-17 (×2): 15 mL via OROMUCOSAL

## 2018-01-16 MED ORDER — MORPHINE SULFATE (PF) 2 MG/ML IV SOLN
1.0000 mg | INTRAVENOUS | Status: DC | PRN
  Administered 2018-01-16: 2 mg via INTRAVENOUS
  Filled 2018-01-16: qty 1

## 2018-01-16 MED ORDER — HEPARIN SODIUM (PORCINE) 1000 UNIT/ML DIALYSIS
1000.0000 [IU] | INTRAMUSCULAR | Status: DC | PRN
  Administered 2018-01-17: 4000 [IU] via INTRAVENOUS_CENTRAL
  Filled 2018-01-16 (×3): qty 6

## 2018-01-16 MED ORDER — NOREPINEPHRINE 16 MG/250ML-% IV SOLN
0.0000 ug/min | INTRAVENOUS | Status: DC
  Administered 2018-01-17: 10 ug/min via INTRAVENOUS
  Administered 2018-01-18 (×2): 30 ug/min via INTRAVENOUS
  Filled 2018-01-16 (×3): qty 250

## 2018-01-16 MED ORDER — JUVEN PO PACK: 1.0000 | PACK | Freq: Two times a day (BID) | ORAL | Status: DC

## 2018-01-16 MED ORDER — SODIUM CHLORIDE 0.9% FLUSH
10.0000 mL | Freq: Two times a day (BID) | INTRAVENOUS | Status: DC
  Administered 2018-01-16: 30 mL
  Administered 2018-01-17: 10 mL

## 2018-01-16 MED ORDER — OCUVITE-LUTEIN PO CAPS
1.0000 | ORAL_CAPSULE | Freq: Every day | ORAL | Status: DC
  Filled 2018-01-16 (×2): qty 1

## 2018-01-16 MED ORDER — PUREFLOW DIALYSIS SOLUTION
INTRAVENOUS | Status: DC
  Administered 2018-01-16 – 2018-01-17 (×3): via INTRAVENOUS_CENTRAL

## 2018-01-16 MED ORDER — B COMPLEX-C PO TABS
1.0000 | ORAL_TABLET | Freq: Every day | ORAL | Status: DC
  Filled 2018-01-16 (×3): qty 1

## 2018-01-16 MED ORDER — VANCOMYCIN HCL IN DEXTROSE 1-5 GM/200ML-% IV SOLN
1000.0000 mg | INTRAVENOUS | Status: DC
  Administered 2018-01-16 – 2018-01-17 (×2): 1000 mg via INTRAVENOUS
  Filled 2018-01-16 (×3): qty 200

## 2018-01-16 MED ORDER — ORAL CARE MOUTH RINSE
15.0000 mL | Freq: Two times a day (BID) | OROMUCOSAL | Status: DC
  Administered 2018-01-17 (×2): 15 mL via OROMUCOSAL

## 2018-01-16 MED ORDER — PRO-STAT SUGAR FREE PO LIQD: 30.0000 mL | Freq: Two times a day (BID) | ORAL | Status: DC

## 2018-01-16 MED ORDER — NEPRO/CARBSTEADY PO LIQD: 1000.0000 mL | ORAL | Status: DC

## 2018-01-16 MED ORDER — LEVOTHYROXINE SODIUM 50 MCG PO TABS: 25.0000 ug | ORAL_TABLET | Freq: Every day | ORAL | Status: DC

## 2018-01-16 MED ORDER — SODIUM CHLORIDE 0.9 % IV SOLN
3.0000 g | Freq: Four times a day (QID) | INTRAVENOUS | Status: DC
  Administered 2018-01-16 – 2018-01-18 (×6): 3 g via INTRAVENOUS
  Filled 2018-01-16 (×11): qty 3

## 2018-01-16 MED ORDER — ORAL CARE MOUTH RINSE: 15.0000 mL | Freq: Two times a day (BID) | OROMUCOSAL | Status: DC

## 2018-01-16 MED ORDER — SODIUM CHLORIDE 0.9% FLUSH: 10.0000 mL | INTRAVENOUS | Status: DC | PRN

## 2018-01-16 NOTE — Progress Notes (Signed)
PT Cancellation Note  Patient Details Name: Andrew [name] MRN: [id number] DOB: 04-26-1933   Cancelled Treatment:    Reason Eval/Treat Not Completed: Medical issues which prohibited therapy(Patient with decline in medical status requiring transfer to CCU (now on pressors).  Per policy, will require new orders to resume PT services.  Please re-consult as medically appropriate.)   Netasha Wehrli H. Owens Shark, PT, DPT, NCS 01/16/18, 10:54 AM [phone]

## 2018-01-16 NOTE — Progress Notes (Signed)
CRRT inatiated without issue. Pt tolerating well. Pt remains on vasopressors and precedex. RN will continue to closely monitor.

## 2018-01-16 NOTE — Progress Notes (Addendum)
Inpatient Diabetes Program Recommendations  AACE/ADA: New Consensus Statement on Inpatient Glycemic Control (2015)  Target Ranges:  Prepandial:   less than 140 mg/dL      Peak postprandial:   less than 180 mg/dL (1-2 hours)      Critically ill patients:  140 - 180 mg/dL   Results for EDGERRIN, CORREIA (MRN [id number]) as of 01/16/2018 08:40  Ref. Range 01/14/2018 06:51 01/14/2018 06:57 01/15/2018 11:32 01/15/2018 11:35 01/15/2018 14:05 01/15/2018 14:07 01/15/2018 15:03 01/15/2018 17:39 01/15/2018 20:17 01/15/2018 22:09  Glucose-Capillary Latest Ref Range: 65 - 99 mg/dL 28 (LL) 173 (H) <10 (LL) 89 <10 (LL) <10 (LL) 89 196 (H) 256 (H) 315 (H)   Results for SHAHRUKH, PASCH (MRN [id number]) as of 01/16/2018 08:40  Ref. Range 01/15/2018 23:54 01/16/2018 02:11 01/16/2018 04:09 01/16/2018 06:00 01/16/2018 08:18  Glucose-Capillary Latest Ref Range: 65 - 99 mg/dL 291 (H) 272 (H) 314 (H) 303 (H) 255 (H)     Admit with: AMS/ ESRD/ Sepsis  NO History of DM noted  Current Insulin Orders: None      Note patient was having issues with severe Hypoglycemia.  Now having Hyperglycemia since IV Solucortef initiated.   MD- May consider Novolog Sensitive Correction Scale/ SSI (0-9 units) Q4 hours if CBGs remain elevated while on steroids and if within goals of care.      --Will follow patient during hospitalization--  Wyn Quaker RN, MSN, CDE Diabetes Coordinator Inpatient Glycemic Control Team Team Pager: [phone] (8a-5p)

## 2018-01-16 NOTE — Progress Notes (Signed)
[facility]  ROUNDING NOTE   Subjective:   Daughter at bedside.   Patient placed on norepinephrine and vasopressors.   Sedated with demadex.   Wbc trending down.   Objective:  Vital signs in last 24 hours:  Temp:  [97.6 F (36.4 C)-98.1 F (36.7 C)] 97.6 F (36.4 C) (06/06 0400) Pulse Rate:  [79-104] 79 (06/05 2200) Resp:  [14-38] 21 (06/06 0600) BP: (43-164)/(17-126) 102/66 (06/06 0600) SpO2:  [91 %-100 %] 96 % (06/06 0400)  Weight change:  Filed Weights   12/21/2017 0828 01/13/18 0845 01/13/18 1251  Weight: 68 kg (150 lb) 71.2 kg (156 lb 15.5 oz) 70.8 kg (156 lb 1.4 oz)    Intake/Output: I/O last 3 completed shifts: In: 1465.6 [I.V.:915.6; IV Piggyback:550] Out: 237 [Other:237]   Intake/Output this shift:  No intake/output data recorded.  Physical Exam: General: Lethargic, critically ill  Head: Dental caries, right tongue lesion >1cm  Eyes: Anicteric  Neck: Supple, trachea midline  Lungs:  Bilateral crackles, 2L [place]  Heart: regular  Abdomen:  Soft, nontender, BS present  Extremities: + peripheral edema.   Neurologic: sedated  Skin: Warm, dry, no rash  Access:  Right internal jugular permcath    Basic Metabolic Panel: Recent Labs  Lab 01/08/2018 0935 12/26/2017 1759 01/14/18 0609 01/15/18 0932 01/16/18 0536  NA 138  --  141 140 135  K 3.3*  --  3.5 3.7 4.8  CL 101  --  102 100* 99*  CO2 24  --  27 25 22   GLUCOSE 108*  --  37* <20* 322*  BUN 31*  --  25* 39* 32*  CREATININE 3.64*  --  3.05* 4.25* 3.24*  CALCIUM 8.0*  --  7.7* 7.9* 7.5*  MG 1.9  --   --   --   --   PHOS 2.8 3.0 3.0  --   --     Liver Function Tests: Recent Labs  Lab 12/27/2017 0935 01/13/18 1719 01/14/18 0609  AST 300* 186*  --   ALT 201* 152*  --   ALKPHOS 150* 102  --   BILITOT 3.5* 3.4*  --   PROT 5.1* 4.7*  --   ALBUMIN 2.2* 2.2* 1.9*   No results for input(s): LIPASE, AMYLASE in the last 168 hours. No results for input(s): AMMONIA in the last 168  hours.  CBC: Recent Labs  Lab 01/02/2018 0838 01/13/18 1011 01/14/18 0609 01/15/18 0932 01/16/18 0536  WBC 19.8* 16.9* 15.6* 13.7* 11.4*  NEUTROABS  --  15.4* 13.4*  --  10.4*  HGB 11.7* 9.5* 8.8* 9.0* 9.2*  HCT 36.9* 30.0* 27.2* 28.2* 28.8*  MCV 74.6* 76.0* 76.1* 76.6* 77.7*  PLT 272 211 165 158 146*    Cardiac Enzymes: Recent Labs  Lab 12/15/2017 0935 01/04/2018 1245 01/06/2018 1759  TROPONINI 0.63* 0.57* 0.56*    BNP: Invalid input(s): POCBNP  CBG: Recent Labs  Lab 01/15/18 2354 01/16/18 0211 01/16/18 0409 01/16/18 0600 01/16/18 0818  GLUCAP 291* 272* 314* 303* 62*    Microbiology: Results for orders placed or performed during the hospital encounter of 12/24/2017  Blood Culture (routine x 2)     Status: None   Collection Time: 12/19/2017 10:06 AM  Result Value Ref Range Status   Specimen Description BLOOD LEFT ARM  Final   Special Requests   Final    BOTTLES DRAWN AEROBIC AND ANAEROBIC Blood Culture results may not be optimal due to an inadequate volume of blood received in culture bottles  Culture   Final    NO GROWTH 5 DAYS Performed at [facility], [address] [place], [place] [address]    Report Status 01/15/2018 FINAL  Final  Blood Culture (routine x 2)     Status: None   Collection Time: 01/07/2018 10:11 AM  Result Value Ref Range Status   Specimen Description BLOOD LEFT ARM  Final   Special Requests   Final    BOTTLES DRAWN AEROBIC AND ANAEROBIC Blood Culture results may not be optimal due to an inadequate volume of blood received in culture bottles   Culture   Final    NO GROWTH 5 DAYS Performed at [facility], [address] [place], [place] [address]    Report Status 01/15/2018 FINAL  Final  MRSA PCR Screening     Status: None   Collection Time: 12/17/2017  2:30 PM  Result Value Ref Range Status   MRSA by PCR NEGATIVE NEGATIVE Final    Comment:        The GeneXpert MRSA Assay (FDA approved for NASAL specimens only), is  one component of a comprehensive MRSA colonization surveillance program. It is not intended to diagnose MRSA infection nor to guide or monitor treatment for MRSA infections. Performed at [facility], [address] [place], [place] [address]   CULTURE, BLOOD (ROUTINE X 2) w Reflex to ID Panel     Status: None (Preliminary result)   Collection Time: 01/15/18  4:23 PM  Result Value Ref Range Status   Specimen Description BLOOD A-LINE DRAW  Final   Special Requests   Final    BOTTLES DRAWN AEROBIC AND ANAEROBIC Blood Culture adequate volume   Culture   Final    NO GROWTH < 24 HOURS Performed at [facility], [address], [place], [place] [address]    Report Status PENDING  Incomplete  CULTURE, BLOOD (ROUTINE X 2) w Reflex to ID Panel     Status: None (Preliminary result)   Collection Time: 01/15/18  5:38 PM  Result Value Ref Range Status   Specimen Description BLOOD BLOOD LEFT WRIST  Final   Special Requests   Final    BOTTLES DRAWN AEROBIC AND ANAEROBIC Blood Culture results may not be optimal due to an inadequate volume of blood received in culture bottles   Culture   Final    NO GROWTH < 12 HOURS Performed at [facility], [address] [place], [place] [address]    Report Status PENDING  Incomplete  MRSA PCR Screening     Status: None   Collection Time: 01/15/18  6:44 PM  Result Value Ref Range Status   MRSA by PCR NEGATIVE NEGATIVE Final    Comment:        The GeneXpert MRSA Assay (FDA approved for NASAL specimens only), is one component of a comprehensive MRSA colonization surveillance program. It is not intended to diagnose MRSA infection nor to guide or monitor treatment for MRSA infections. Performed at [facility], [address] [place],  [address]     Coagulation Studies: Recent Labs    01/13/18 1719  LABPROT 15.0  INR 1.19    Urinalysis: No results for input(s): COLORURINE, LABSPEC, PHURINE,  GLUCOSEU, HGBUR, BILIRUBINUR, KETONESUR, PROTEINUR, UROBILINOGEN, NITRITE, LEUKOCYTESUR in the last 72 hours.  Invalid input(s): APPERANCEUR    Imaging: No results found.   Medications:   . ampicillin-sulbactam (UNASYN) IV Stopped (01/16/18 0600)  . dexmedetomidine (PRECEDEX) IV infusion 0.5 mcg/kg/hr (01/16/18  [id number])  . famotidine (PEPCID) IV Stopped (01/15/18 2225)  . norepinephrine (LEVOPHED) 16mg  / 279mL infusion 10 mcg/min (01/16/18 0854)  . pureflow    . vasopressin (PITRESSIN) infusion - *FOR SHOCK* 0.03 Units/min (01/15/18 1608)   . amiodarone  200 mg Oral Daily  . aspirin EC  81 mg Oral Daily  . atorvastatin  40 mg Oral q1800  . Chlorhexidine Gluconate Cloth  6 each Topical Q0600  . collagenase   Topical Daily  . feeding supplement (NEPRO CARB STEADY)  237 mL Oral TID BM  . finasteride  5 mg Oral Daily  . fluticasone  2 spray Each Nare Daily  . heparin injection (subcutaneous)  5,000 Units Subcutaneous Q12H  . hydrocortisone sod succinate (SOLU-CORTEF) inj  100 mg Intravenous Q8H  . lidocaine  15 mL Mouth/Throat Q6H  . magic mouthwash  15 mL Oral QID  . megestrol  200 mg Oral QHS  . midodrine  20 mg Oral TID WC  . mirtazapine  7.5 mg Oral QHS  . multivitamin  1 tablet Oral QHS   acetaminophen, benzonatate, heparin, senna-docusate, sodium chloride  Assessment/ Plan:  [name] is a 84 y.o. black male with hypertension, hyperlipidemia, gout, ? history of prostate cancer, coronary artery disease, who was admitted to [facility] 01/07/2018 for hypoxia, AMS, pneumonia.   [facility] MWF RIJ permcath  1. End Stage Renal disease: acute renal failure with limited recovery. Hemodialysis treatment yesterday was not tolerated well due to hypotension and hypoglycemia and worsening mental status Hemodynamically unstable this morning requiring two vasopressors.  - Initiate CVVHD therapy rate 2 liters BFR 350. Ultrafiltration of 0.25 and may titrate up - Discontinue IV albumin    2.  Right lower lobe pneumonia. Concern for progression to septic shock. Requiring vasopressors. Blood cultures pending.  Leukocytes are tending down. Afebrile.  - Empiric Unasyn.  3. Hypotension: with acute exacerbation of systolic and diastolic congestive heart failure. With anasarca on examination.  Requiring vasopressors.  Unable to tolerate PO midodrine.   4.  Anemia of chronic kidney disease: hemoglobin 9.2 - EPO has not been given on this admission.   5. Secondary Hyperparathyroidism: not currently on binders. Unable to tolerate PO right now.   6. Nutrition with hypoalbuminemia - Recommend starting tube feeds.    LOS: 3 Zailah Zagami 6/6/20199:37 AM

## 2018-01-16 NOTE — Progress Notes (Signed)
Tube feedings not started due to abd xray results concerning for SBO. NG hooked to LIS. CRRT running without complications. Daughter at bedside and updated, all questions answered. Will continue to monitor closely.

## 2018-01-16 NOTE — Progress Notes (Signed)
Follow up - Critical Care Medicine Note  Patient Details:    Andrew [name] is an 84 y.o. male.with a past medical history remarkable for hypertension, coronary artery disease, atrial fibrillation, hyperlipidemia, prostate cancer, valvular disease, end-stage renal disease on hemodialysis, was initially admitted after having difficulties over hemodialysis. He had nausea, vomiting, belching, change in mental status with choking and possible aspiration. He was subsequently admitted to the hospital. Unfortunately today hemodialysis was stopped after 2.5 hours secondary to hypotension, hypoglycemia and altered mental status. Patient has arrived in the intensive care unit   Lines, Airways, Drains: CVC Triple Lumen 01/15/18 Right Femoral (Active)  Indication for Insertion or Continuance of Line Vasoactive infusions 01/15/2018  8:00 PM  Site Assessment Clean;Dry;Intact 01/15/2018  8:00 PM  Proximal Lumen Status Infusing 01/15/2018  8:00 PM  Medial Lumen Status Infusing 01/15/2018  8:00 PM  Distal Lumen Status Infusing 01/15/2018  8:00 PM  Dressing Type Transparent 01/15/2018  8:00 PM  Dressing Status Intact 01/15/2018  8:00 PM  Line Care Connections checked and tightened 01/15/2018  8:00 PM  Dressing Change Due 01/22/18 01/15/2018  8:00 PM    Anti-infectives:  Anti-infectives (From admission, onward)   Start     Dose/Rate Route Frequency Ordered Stop   01/13/18 1700  Ampicillin-Sulbactam (UNASYN) 3 g in sodium chloride 0.9 % 100 mL IVPB     3 g 200 mL/hr over 30 Minutes Intravenous Every 12 hours 01/13/18 1450     01/11/18 1000  ceFEPIme (MAXIPIME) 1 g in sodium chloride 0.9 % 100 mL IVPB  Status:  Discontinued     1 g 200 mL/hr over 30 Minutes Intravenous Every 24 hours 12/16/2017 1535 01/13/18 1443   01/02/2018 1700  vancomycin (VANCOCIN) 500 mg in sodium chloride 0.9 % 100 mL IVPB     500 mg 100 mL/hr over 60 Minutes Intravenous  Once 12/28/2017 1536 01/03/2018 1708   12/12/2017 1115  fluconazole (DIFLUCAN) tablet 100  mg  Status:  Discontinued     100 mg Oral Daily 01/09/2018 1112 01/14/18 1333   01/09/2018 1015  ceFEPIme (MAXIPIME) 2 g in sodium chloride 0.9 % 100 mL IVPB     2 g 200 mL/hr over 30 Minutes Intravenous  Once 12/25/2017 1005 12/14/2017 1127   12/31/2017 1015  vancomycin (VANCOCIN) IVPB 1000 mg/200 mL premix     1,000 mg 200 mL/hr over 60 Minutes Intravenous  Once 12/25/2017 1005 12/19/2017 1227      Microbiology: Results for orders placed or performed during the hospital encounter of 12/30/2017  Blood Culture (routine x 2)     Status: None   Collection Time: 12/23/2017 10:06 AM  Result Value Ref Range Status   Specimen Description BLOOD LEFT ARM  Final   Special Requests   Final    BOTTLES DRAWN AEROBIC AND ANAEROBIC Blood Culture results may not be optimal due to an inadequate volume of blood received in culture bottles   Culture   Final    NO GROWTH 5 DAYS Performed at [facility], [address] [place], [place] [address]    Report Status 01/15/2018 FINAL  Final  Blood Culture (routine x 2)     Status: None   Collection Time: 12/24/2017 10:11 AM  Result Value Ref Range Status   Specimen Description BLOOD LEFT ARM  Final   Special Requests   Final    BOTTLES DRAWN AEROBIC AND ANAEROBIC Blood Culture results may not be optimal due to an inadequate volume of blood received  in culture bottles   Culture   Final    NO GROWTH 5 DAYS Performed at [facility], [address] [place], [place] [address]    Report Status 01/15/2018 FINAL  Final  MRSA PCR Screening     Status: None   Collection Time: 01/06/2018  2:30 PM  Result Value Ref Range Status   MRSA by PCR NEGATIVE NEGATIVE Final    Comment:        The GeneXpert MRSA Assay (FDA approved for NASAL specimens only), is one component of a comprehensive MRSA colonization surveillance program. It is not intended to diagnose MRSA infection nor to guide or monitor treatment for MRSA infections. Performed at [facility], [address] [place], [place] [address]   CULTURE, BLOOD (ROUTINE X 2) w Reflex to ID Panel     Status: None (Preliminary result)   Collection Time: 01/15/18  4:23 PM  Result Value Ref Range Status   Specimen Description BLOOD A-LINE DRAW  Final   Special Requests   Final    BOTTLES DRAWN AEROBIC AND ANAEROBIC Blood Culture adequate volume   Culture   Final    NO GROWTH < 24 HOURS Performed at [facility], [address] [place], [place] [address]    Report Status PENDING  Incomplete  CULTURE, BLOOD (ROUTINE X 2) w Reflex to ID Panel     Status: None (Preliminary result)   Collection Time: 01/15/18  5:38 PM  Result Value Ref Range Status   Specimen Description BLOOD BLOOD LEFT WRIST  Final   Special Requests   Final    BOTTLES DRAWN AEROBIC AND ANAEROBIC Blood Culture results may not be optimal due to an inadequate volume of blood received in culture bottles   Culture   Final    NO GROWTH < 12 HOURS Performed at [facility], [address], [place], [place] [address]    Report Status PENDING  Incomplete  MRSA PCR Screening     Status: None   Collection Time: 01/15/18  6:44 PM  Result Value Ref Range Status   MRSA by PCR NEGATIVE NEGATIVE Final    Comment:        The GeneXpert MRSA Assay (FDA approved for NASAL specimens only), is one component of a comprehensive MRSA colonization surveillance program. It is not intended to diagnose MRSA infection nor to guide or monitor treatment for MRSA infections. Performed at [facility], [address] [place], [place] [address]    Events: patient was transferred to the intensive care unit, central line was placed, started on vasopressin and norepinephrine, antibiotics include Unasyn and vancomycin  Studies: Dg Chest 2 View  Result Date: 12/26/2017 CLINICAL DATA:  Shortness of breath and weakness for several hours EXAM: CHEST - 2 VIEW COMPARISON:  12/20/2017 FINDINGS: Dialysis catheter is  again identified. Stable cardiomegaly is seen. Small pleural effusions and bilateral lower lobe infiltrate/atelectasis is noted slightly greater on the right than the left. The overall appearance is similar to that seen on the prior exam. No new bony abnormality is seen. IMPRESSION: Overall stable appearance of the chest with bilateral pleural effusions and lower lobe infiltrate/atelectasis. Electronically Signed   By: Inez Catalina M.D.   On: 12/26/2017 16:21   Dg Chest 2 View  Result Date: 12/20/2017 CLINICAL DATA:  Chest tightness with mild dyspnea. EXAM: CHEST - 2 VIEW COMPARISON:  12/01/2017 FINDINGS: Stable cardiomegaly with moderate aortic atherosclerosis. New bilateral small pleural effusions obscuring the diaphragms  and spanning approximately of 1-1/2 to 2 vertebral body heights on the lateral view. New right IJ dialysis catheter tip terminates in the distal SVC. IMPRESSION: Cardiomegaly with new small bilateral pleural effusions. No overt pulmonary edema. New right IJ dialysis catheter is noted without apparent complication. Electronically Signed   By: Ashley Royalty M.D.   On: 12/20/2017 00:53   Mr Brain Wo Contrast  Result Date: 12/26/2017 CLINICAL DATA:  84 year old male dialysis patient with altered mental status, weakness, and slurred speech. EXAM: MRI HEAD WITHOUT CONTRAST TECHNIQUE: Multiplanar, multiecho pulse sequences of the brain and surrounding structures were obtained without intravenous contrast. COMPARISON:  None. FINDINGS: Brain: Generalized cerebral volume loss with ex vacuo appearing ventricular enlargement. No restricted diffusion to suggest acute infarction. No midline shift, mass effect, evidence of mass lesion, extra-axial collection or acute intracranial hemorrhage. Cervicomedullary junction and pituitary are within normal limits. Pearline Cables and white matter signal is within normal limits for age throughout the brain. No cortical encephalomalacia or definite chronic cerebral blood  products. Signal in the deep gray matter nuclei, brainstem, and cerebellum is normal for age. Vascular: Major intracranial vascular flow voids are preserved. Skull and upper cervical spine: Negative for age visible cervical spine. Normal bone marrow signal. Sinuses/Orbits: Normal orbits soft tissues. Paranasal sinuses and mastoids are well pneumatized. Other: Visible internal auditory structures appear normal. Scalp and face soft tissues appear negative. IMPRESSION: No acute intracranial abnormality. Negative for age noncontrast MRI appearance of the brain. Electronically Signed   By: Genevie Ann M.D.   On: 12/17/2017 14:27   Dg Chest Port 1 View  Result Date: 01/13/2018 CLINICAL DATA:  Pneumonia. History of prostate cancer, hypertension and mitral valve disorder. EXAM: PORTABLE CHEST 1 VIEW COMPARISON:  Radiographs 12/18/2017 and 12/26/2017. FINDINGS: 1452 hours. Right IJ hemodialysis catheters are unchanged near the superior cavoatrial junction. The heart size and mediastinal contours are stable. There are increased pleural effusions and right-greater-than-left basilar airspace opacities. There is probable mild edema. No pneumothorax. The bones appear unchanged. IMPRESSION: Worsening pleural effusions, bibasilar pulmonary opacities and probable edema. Electronically Signed   By: Richardean Sale M.D.   On: 01/13/2018 15:15   Dg Chest Port 1 View  Result Date: 12/23/2017 CLINICAL DATA:  Syncope. EXAM: PORTABLE CHEST 1 VIEW COMPARISON:  12/26/2017. FINDINGS: Dual-lumen catheter with tip over superior vena cava. Cardiomegaly. Progressive right lower lobe infiltrate. Right pleural effusion again noted. No pneumothorax. IMPRESSION: 1.  Dual-lumen catheter with tip over superior vena cava. 2. Progressive right lower lobe infiltrate. Right pleural effusion again noted. Electronically Signed   By: Marcello Moores  Register   On: 12/31/2017 09:04    Consults: Treatment Team:  Anthonette Legato, MD   Subjective:     Overnight Issues: no change overnight  Objective:  Vital signs for last 24 hours: Temp:  [97.6 F (36.4 C)-98.1 F (36.7 C)] 97.6 F (36.4 C) (06/06 0400) Pulse Rate:  [79-104] 79 (06/05 2200) Resp:  [14-38] 21 (06/06 0600) BP: (43-164)/(17-126) 102/66 (06/06 0600) SpO2:  [91 %-100 %] 96 % (06/06 0400)  Hemodynamic parameters for last 24 hours:    Intake/Output from previous day: 06/05 0701 - 06/06 0700 In: 1465.6 [I.V.:915.6; IV Piggyback:550] Out: 237   Intake/Output this shift: No intake/output data recorded.  Vent settings for last 24 hours:    Physical Exam:  Resting comfortably this morning Vital signs:       Please see the above listed vital signs HEENT:  Trachea is midline, no accessory muscle utilization, on nasal cannula, no distended neck veins, appears to have a lesion on the lateral aspect of his tongue will need to be addressed at some point Cardiovascular:           Regular rhythm, systolic murmur mitral post Pulmonary:      Crackles appreciated right greater than left, vascular access noted Abdominal:      Positive bowel sounds, soft exam Extremity:        No clubbing cyanosis or edema noted Neurologic:      Patient moves all extremities,is confused, shaking head, encephalopathic Cutaneous:      No rashes or lesions noted    Assessment/Plan:   End-stage renal failure. On hemodialysis, had to be stopped after 2-1/2 hours  secondary to hypotension and hypoglycemia along with altered mental status. Patient was moved to the intensive care unit for closer monitoring and therapeutics. Required central line placement, was started on vasopressin and norepinephrine  Hypoglycemia. Resolved  Sepsis. Felt to be secondary to pneumonia, on Unasyn and vancomycin, monitor closely, support hemodynamics pending culture data  Leukocytosis Has improved on antibiotic therapy most recent is 11.4  Anemia. Stable no evidence of active bleeding  Tongue  lesion. Difficult to assess, will need ENT eventually to evaluate and possible biopsy  Critical care time 35 minutes   Sharief Wainwright 01/16/2018  *Care during the described time interval was provided by me and/or other providers on the critical care team.  I have reviewed this patient's available data, including medical history, events of note, physical examination and test results as part of my evaluation.

## 2018-01-16 NOTE — Progress Notes (Signed)
Pt has precedex infusing.  At change of shift, patient was very lethargic. Hinton Dyer, NP made aware. Precedex was stopped at 20:17.  Three hours later, pt became anxious and very restless.  Precedex was restarted at 23:13.  Pt now resting comfortably.

## 2018-01-16 NOTE — Progress Notes (Addendum)
[place] at [facility]                                                                                                                                                                                  Patient Demographics   Andrew [name], is a 84 y.o. male, DOB - May 01, 1933, NGE:[id number]  Admit date - 12/13/2017   Admitting Physician Loletha Grayer, MD  Outpatient Primary MD for the patient is [name], MD   LOS - 3  Subjective:  Patient remains lethargic hard to arouse requiring pressors  Review of Systems:   CONSTITUTIONAL: Hard to arouse  Vitals:   Vitals:   01/16/18 1000 01/16/18 1100 01/16/18 1300 01/16/18 1400  BP: 102/65 98/64 (!) 127/110 104/67  Pulse:      Resp: 19 20 16  (!) 26  Temp:      TempSrc:      SpO2:      Weight:      Height:        Wt Readings from Last 3 Encounters:  01/13/18 70.8 kg (156 lb 1.4 oz)  12/26/17 73.5 kg (162 lb)  12/21/17 72.4 kg (159 lb 11.2 oz)     Intake/Output Summary (Last 24 hours) at 01/16/2018 1532 Last data filed at 01/16/2018 1400 Gross per 24 hour  Intake 1465.6 ml  Output 0 ml  Net 1465.6 ml    Physical Exam:   GENERAL: Critically ill-appearing HEAD, EYES, EARS, NOSE AND THROAT: Atraumatic, normocephalic.  Pupils equal and reactive to light. Sclerae anicteric. No conjunctival injection. No oro-pharyngeal erythema.  NECK: Supple. There is no jugular venous distention. No bruits, no lymphadenopathy, no thyromegaly.  HEART: Regular rate and rhythm,. No murmurs, no rubs, no clicks.  LUNGS: Rhonchus breath sounds bilaterally ABDOMEN: Soft, flat, nontender, nondistended. Has good bowel sounds. No hepatosplenomegaly appreciated.  EXTREMITIES: No evidence of any cyanosis, clubbing, or 2+ peripheral edema.  +2 pedal and radial pulses bilaterally.  NEUROLOGIC: Drowsy  sKIN: Moist and warm with no rashes appreciated.  Psych: Drowsy LN: No inguinal LN enlargement    Antibiotics    Anti-infectives (From admission, onward)   Start     Dose/Rate Route Frequency Ordered Stop   01/16/18 1127  Ampicillin-Sulbactam (UNASYN) 3 g in sodium chloride 0.9 % 100 mL IVPB     3 g 200 mL/hr over 30 Minutes Intravenous Every 6 hours 01/16/18 0957     01/16/18 1030  vancomycin (VANCOCIN) IVPB 1000 mg/200 mL premix     1,000 mg 200 mL/hr over 60 Minutes Intravenous Every 24 hours 01/16/18 1023     01/13/18 1700  Ampicillin-Sulbactam (UNASYN) 3 g in sodium chloride 0.9 %  100 mL IVPB  Status:  Discontinued     3 g 200 mL/hr over 30 Minutes Intravenous Every 12 hours 01/13/18 1450 01/16/18 0957   01/11/18 1000  ceFEPIme (MAXIPIME) 1 g in sodium chloride 0.9 % 100 mL IVPB  Status:  Discontinued     1 g 200 mL/hr over 30 Minutes Intravenous Every 24 hours 12/15/2017 1535 01/13/18 1443   12/23/2017 1700  vancomycin (VANCOCIN) 500 mg in sodium chloride 0.9 % 100 mL IVPB     500 mg 100 mL/hr over 60 Minutes Intravenous  Once 12/11/2017 1536 12/19/2017 1708   12/17/2017 1115  fluconazole (DIFLUCAN) tablet 100 mg  Status:  Discontinued     100 mg Oral Daily 01/02/2018 1112 01/14/18 1333   01/01/2018 1015  ceFEPIme (MAXIPIME) 2 g in sodium chloride 0.9 % 100 mL IVPB     2 g 200 mL/hr over 30 Minutes Intravenous  Once 12/23/2017 1005 12/19/2017 1127   01/03/2018 1015  vancomycin (VANCOCIN) IVPB 1000 mg/200 mL premix     1,000 mg 200 mL/hr over 60 Minutes Intravenous  Once 12/28/2017 1005 12/23/2017 1227      Medications   Scheduled Meds: . amiodarone  200 mg Oral Daily  . aspirin EC  81 mg Oral Daily  . atorvastatin  40 mg Oral q1800  . B-complex with vitamin C  1 tablet Per Tube Daily  . Chlorhexidine Gluconate Cloth  6 each Topical Q0600  . collagenase   Topical Daily  . feeding supplement (PRO-STAT SUGAR FREE 64)  30 mL Per Tube BID  . finasteride  5 mg Oral Daily  . fluticasone  2 spray Each Nare Daily  . heparin injection (subcutaneous)  5,000 Units Subcutaneous Q12H  . [START ON 01/17/2018]  levothyroxine  25 mcg Oral QAC breakfast  . lidocaine  15 mL Mouth/Throat Q6H  . magic mouthwash  15 mL Oral QID  . megestrol  200 mg Oral QHS  . midodrine  20 mg Oral TID WC  . mirtazapine  7.5 mg Oral QHS  . [START ON 01/17/2018] multivitamin-lutein  1 capsule Per Tube Daily  . nutrition supplement (JUVEN)  1 packet Per Tube BID BM   Continuous Infusions: . ampicillin-sulbactam (UNASYN) IV    . dexmedetomidine (PRECEDEX) IV infusion 0.8 mcg/kg/hr (01/16/18 1225)  . famotidine (PEPCID) IV Stopped (01/15/18 2225)  . feeding supplement (NEPRO CARB STEADY)    . norepinephrine (LEVOPHED) 16mg  / 246mL infusion 12 mcg/min (01/16/18 1243)  . pureflow 2,000 mL/hr at 01/16/18 1320  . vancomycin    . vasopressin (PITRESSIN) infusion - *FOR SHOCK* 0.03 Units/min (01/16/18 1432)   PRN Meds:.acetaminophen, benzonatate, heparin, senna-docusate, sodium chloride   Data Review:   Micro Results Recent Results (from the past 240 hour(s))  Blood Culture (routine x 2)     Status: None   Collection Time: 01/04/2018 10:06 AM  Result Value Ref Range Status   Specimen Description BLOOD LEFT ARM  Final   Special Requests   Final    BOTTLES DRAWN AEROBIC AND ANAEROBIC Blood Culture results may not be optimal due to an inadequate volume of blood received in culture bottles   Culture   Final    NO GROWTH 5 DAYS Performed at [facility], [address], [place], [place] [address]    Report Status 01/15/2018 FINAL  Final  Blood Culture (routine x 2)     Status: None   Collection Time: 12/11/2017 10:11 AM  Result Value Ref Range Status  Specimen Description BLOOD LEFT ARM  Final   Special Requests   Final    BOTTLES DRAWN AEROBIC AND ANAEROBIC Blood Culture results may not be optimal due to an inadequate volume of blood received in culture bottles   Culture   Final    NO GROWTH 5 DAYS Performed at [facility], [address] [place], [place] [address]    Report Status 01/15/2018  FINAL  Final  MRSA PCR Screening     Status: None   Collection Time: 12/30/2017  2:30 PM  Result Value Ref Range Status   MRSA by PCR NEGATIVE NEGATIVE Final    Comment:        The GeneXpert MRSA Assay (FDA approved for NASAL specimens only), is one component of a comprehensive MRSA colonization surveillance program. It is not intended to diagnose MRSA infection nor to guide or monitor treatment for MRSA infections. Performed at [facility], [address] [place], [place] [address]   CULTURE, BLOOD (ROUTINE X 2) w Reflex to ID Panel     Status: None (Preliminary result)   Collection Time: 01/15/18  4:23 PM  Result Value Ref Range Status   Specimen Description BLOOD A-LINE DRAW  Final   Special Requests   Final    BOTTLES DRAWN AEROBIC AND ANAEROBIC Blood Culture adequate volume   Culture   Final    NO GROWTH < 24 HOURS Performed at [facility], [address] [place], [place] [address]    Report Status PENDING  Incomplete  CULTURE, BLOOD (ROUTINE X 2) w Reflex to ID Panel     Status: None (Preliminary result)   Collection Time: 01/15/18  5:38 PM  Result Value Ref Range Status   Specimen Description BLOOD BLOOD LEFT WRIST  Final   Special Requests   Final    BOTTLES DRAWN AEROBIC AND ANAEROBIC Blood Culture results may not be optimal due to an inadequate volume of blood received in culture bottles   Culture   Final    NO GROWTH < 12 HOURS Performed at [facility], [address] [place], [place] [address]    Report Status PENDING  Incomplete  MRSA PCR Screening     Status: None   Collection Time: 01/15/18  6:44 PM  Result Value Ref Range Status   MRSA by PCR NEGATIVE NEGATIVE Final    Comment:        The GeneXpert MRSA Assay (FDA approved for NASAL specimens only), is one component of a comprehensive MRSA colonization surveillance program. It is not intended to diagnose MRSA infection nor to guide or monitor treatment for MRSA  infections. Performed at [facility], [address] [place], [place] [address]     Radiology Reports Dg Chest 2 View  Result Date: 12/26/2017 CLINICAL DATA:  Shortness of breath and weakness for several hours EXAM: CHEST - 2 VIEW COMPARISON:  12/20/2017 FINDINGS: Dialysis catheter is again identified. Stable cardiomegaly is seen. Small pleural effusions and bilateral lower lobe infiltrate/atelectasis is noted slightly greater on the right than the left. The overall appearance is similar to that seen on the prior exam. No new bony abnormality is seen. IMPRESSION: Overall stable appearance of the chest with bilateral pleural effusions and lower lobe infiltrate/atelectasis. Electronically Signed   By: Inez Catalina M.D.   On: 12/26/2017 16:21   Dg Chest 2 View  Result Date: 12/20/2017 CLINICAL DATA:  Chest tightness with mild dyspnea. EXAM: CHEST - 2 VIEW COMPARISON:  12/01/2017  FINDINGS: Stable cardiomegaly with moderate aortic atherosclerosis. New bilateral small pleural effusions obscuring the diaphragms and spanning approximately of 1-1/2 to 2 vertebral body heights on the lateral view. New right IJ dialysis catheter tip terminates in the distal SVC. IMPRESSION: Cardiomegaly with new small bilateral pleural effusions. No overt pulmonary edema. New right IJ dialysis catheter is noted without apparent complication. Electronically Signed   By: Ashley Royalty M.D.   On: 12/20/2017 00:53   Mr Brain Wo Contrast  Result Date: 12/12/2017 CLINICAL DATA:  84 year old male dialysis patient with altered mental status, weakness, and slurred speech. EXAM: MRI HEAD WITHOUT CONTRAST TECHNIQUE: Multiplanar, multiecho pulse sequences of the brain and surrounding structures were obtained without intravenous contrast. COMPARISON:  None. FINDINGS: Brain: Generalized cerebral volume loss with ex vacuo appearing ventricular enlargement. No restricted diffusion to suggest acute infarction. No midline shift, mass  effect, evidence of mass lesion, extra-axial collection or acute intracranial hemorrhage. Cervicomedullary junction and pituitary are within normal limits. Pearline Cables and white matter signal is within normal limits for age throughout the brain. No cortical encephalomalacia or definite chronic cerebral blood products. Signal in the deep gray matter nuclei, brainstem, and cerebellum is normal for age. Vascular: Major intracranial vascular flow voids are preserved. Skull and upper cervical spine: Negative for age visible cervical spine. Normal bone marrow signal. Sinuses/Orbits: Normal orbits soft tissues. Paranasal sinuses and mastoids are well pneumatized. Other: Visible internal auditory structures appear normal. Scalp and face soft tissues appear negative. IMPRESSION: No acute intracranial abnormality. Negative for age noncontrast MRI appearance of the brain. Electronically Signed   By: Genevie Ann M.D.   On: 12/18/2017 14:27   Dg Chest Port 1 View  Result Date: 01/13/2018 CLINICAL DATA:  Pneumonia. History of prostate cancer, hypertension and mitral valve disorder. EXAM: PORTABLE CHEST 1 VIEW COMPARISON:  Radiographs 12/22/2017 and 12/26/2017. FINDINGS: 1452 hours. Right IJ hemodialysis catheters are unchanged near the superior cavoatrial junction. The heart size and mediastinal contours are stable. There are increased pleural effusions and right-greater-than-left basilar airspace opacities. There is probable mild edema. No pneumothorax. The bones appear unchanged. IMPRESSION: Worsening pleural effusions, bibasilar pulmonary opacities and probable edema. Electronically Signed   By: Richardean Sale M.D.   On: 01/13/2018 15:15   Dg Chest Port 1 View  Result Date: 01/07/2018 CLINICAL DATA:  Syncope. EXAM: PORTABLE CHEST 1 VIEW COMPARISON:  12/26/2017. FINDINGS: Dual-lumen catheter with tip over superior vena cava. Cardiomegaly. Progressive right lower lobe infiltrate. Right pleural effusion again noted. No pneumothorax.  IMPRESSION: 1.  Dual-lumen catheter with tip over superior vena cava. 2. Progressive right lower lobe infiltrate. Right pleural effusion again noted. Electronically Signed   By: Marcello Moores  Register   On: 12/31/2017 09:04     CBC Recent Labs  Lab 12/26/2017 2353 01/13/18 1011 01/14/18 0609 01/15/18 0932 01/16/18 0536  WBC 19.8* 16.9* 15.6* 13.7* 11.4*  HGB 11.7* 9.5* 8.8* 9.0* 9.2*  HCT 36.9* 30.0* 27.2* 28.2* 28.8*  PLT 272 211 165 158 146*  MCV 74.6* 76.0* 76.1* 76.6* 77.7*  MCH 23.6* 24.1* 24.5* 24.4* 24.9*  MCHC 31.7* 31.7* 32.3 31.9* 32.1  RDW 24.7* 37.0* 36.9* 37.5* 36.7*  LYMPHSABS  --  0.7* 1.2  --  0.6*  MONOABS  --  0.8 0.9  --  0.4  EOSABS  --  0.0 0.1  --  0.0  BASOSABS  --  0.0 0.0  --  0.0    Chemistries  Recent Labs  Lab 12/17/2017 0935 01/13/18 1719 01/14/18 0609 01/15/18  0932 01/16/18 0536 01/16/18 1302  NA 138  --  141 140 135 138  K 3.3*  --  3.5 3.7 4.8 4.6  CL 101  --  102 100* 99* 102  CO2 24  --  27 25 22  20*  GLUCOSE 108*  --  37* <20* 322* 232*  BUN 31*  --  25* 39* 32* 36*  CREATININE 3.64*  --  3.05* 4.25* 3.24* 3.33*  CALCIUM 8.0*  --  7.7* 7.9* 7.5* 7.5*  MG 1.9  --   --   --   --  1.8  AST 300* 186*  --   --   --   --   ALT 201* 152*  --   --   --   --   ALKPHOS 150* 102  --   --   --   --   BILITOT 3.5* 3.4*  --   --   --   --    ------------------------------------------------------------------------------------------------------------------ estimated creatinine clearance is 17.1 mL/min (A) (by C-G formula based on SCr of 3.33 mg/dL (H)). ------------------------------------------------------------------------------------------------------------------ No results for input(s): HGBA1C in the last 72 hours. ------------------------------------------------------------------------------------------------------------------ No results for input(s): CHOL, HDL, LDLCALC, TRIG, CHOLHDL, LDLDIRECT in the last 72  hours. ------------------------------------------------------------------------------------------------------------------ Recent Labs    01/15/18 1622  TSH 8.760*  T4TOTAL 5.8   ------------------------------------------------------------------------------------------------------------------ No results for input(s): VITAMINB12, FOLATE, FERRITIN, TIBC, IRON, RETICCTPCT in the last 72 hours.  Coagulation profile Recent Labs  Lab 01/13/18 1719  INR 1.19    No results for input(s): DDIMER in the last 72 hours.  Cardiac Enzymes Recent Labs  Lab 12/13/2017 0935 12/12/2017 1245 01/06/2018 1759  TROPONINI 0.63* 0.57* 0.56*   ------------------------------------------------------------------------------------------------------------------ Invalid input(s): POCBNP    Assessment & Plan   1.    Hypotension suspect due to combination of pneumonia and progressive worsening of the condition continue pressors 2.  Healthcare associated pneumonia, leukocytosis.    Continue Unasyn  3.    Acute on chronic chronic hypoxic respiratory failure on oxygen.  4.  Hypoglycemia this is related to his sepsis poor p.o. intake we will start him on a dextrose infusion 5.  History of congestive heart failure with mid range ejection fraction.  Dialysis to manage fluid. 6.  End-stage renal disease.    Dialysis per nephrology 7.  History of gout 8.  Anemia of chronic disease 9.  Paroxysmal atrial fibrillation on amiodarone and aspirin 10.  Weakness physical therapy evaluation   Prognosis very poor high risk of death family has poor understanding of the condition   Code Status History    Date Active Date Inactive Code Status Order ID Comments User Context   12/26/2017 2019 12/29/2017 1914 Full Code [id number]  Demetrios Loll, MD Inpatient   12/20/2017 0645 12/21/2017 1922 Full Code [id number]  Arta Silence, MD Inpatient   12/20/2017 0308 12/20/2017 0645 DNR [id number]  Amelia Jo, MD ED   12/02/2017 1128  12/17/2017 1611 DNR [id number]  Max Sane, MD Inpatient   12/01/2017 1522 12/02/2017 1128 Full Code [id number]  Demetrios Loll, MD Inpatient   10/30/2017 2256 11/02/2017 1709 Full Code [id number]  Lance Coon, MD Inpatient   07/26/2017 1728 07/28/2017 1620 Full Code [id number]  Saundra Shelling, MD Inpatient   04/08/2017 1555 04/10/2017 1526 Full Code [id number]  Dustin Flock, MD Inpatient           Consults  nephrolgy  DVT Prophylaxis  heparin  Lab Results  Component Value Date   PLT 146 (L) 01/16/2018  Time Spent in minutes  93min spent Dustin Flock M.D on 01/16/2018 at 3:32 PM  Between 7am to 6pm - Pager - [phone]  After 6pm go to [email or web address] - Proofreader  [employer]   Office  [phone]

## 2018-01-16 NOTE — Clinical Social Work Note (Signed)
Patient's daughter as of yesterday wanted continued aggressive treatment even though her father continues to decline and is unable to sit for dialysis now and was moaning in pain yesterday. Patient has now transferred to ICU. Shela Leff MSW,LCSW [phone]

## 2018-01-16 NOTE — Consult Note (Signed)
Gunther, Zawadzki [id number] 12/27/1932 Riley Nearing, MD  Reason for Consult: Tongue ulcer  Requesting Physician: Dustin Flock, MD Consulting Physician: Riley Nearing, MD  HPI: This 84 y.o. year old male was admitted on 12/14/2017 for Elevated troponin [R74.8] HCAP (healthcare-associated pneumonia) [J18.9]. 84 y.o. male admiteed with dizziness and subsequent mental status changes after getting dizzy in dialysis. He has been diagnosed with pneumonia and has end stage renal disease. An ulcer has been noted on the right oral tongue, but patient is not responsive enough to relay how long it has been present. Family just became aware of it recently after he had some thrush and they thought his teeth may have rubbed it. He is a nonsmoker, and does not drink. His long term outlook is uncertain but has been seen by the palliative team.    Medications:  Current Facility-Administered Medications  Medication Dose Route Frequency Provider Last Rate Last Dose  . acetaminophen (TYLENOL) suppository 650 mg  650 mg Rectal Q6H PRN Dustin Flock, MD   650 mg at 01/15/18 0802  . amiodarone (PACERONE) tablet 200 mg  200 mg Oral Daily Loletha Grayer, MD   200 mg at 01/12/18 0947  . Ampicillin-Sulbactam (UNASYN) 3 g in sodium chloride 0.9 % 100 mL IVPB  3 g Intravenous Q6H Dustin Flock, MD      . aspirin EC tablet 81 mg  81 mg Oral Daily Loletha Grayer, MD   81 mg at 01/12/18 0947  . atorvastatin (LIPITOR) tablet 40 mg  40 mg Oral q1800 Loletha Grayer, MD   40 mg at 01/11/18 1627  . benzonatate (TESSALON) capsule 200 mg  200 mg Oral TID PRN Loletha Grayer, MD      . Chlorhexidine Gluconate Cloth 2 % PADS 6 each  6 each Topical Q0600 Lavonia Dana, MD   6 each at 01/15/18 0437  . collagenase (SANTYL) ointment   Topical Daily Wieting, Richard, MD      . dexmedetomidine (PRECEDEX) 400 MCG/100ML (4 mcg/mL) infusion  0.4-1.2 mcg/kg/hr Intravenous Titrated Conforti, John, DO 8.9 mL/hr at 01/16/18 0909 0.5  mcg/kg/hr at 01/16/18 0909  . famotidine (PEPCID) IVPB 20 mg premix  20 mg Intravenous QHS Dustin Flock, MD   Stopped at 01/15/18 2225  . feeding supplement (NEPRO CARB STEADY) liquid 237 mL  237 mL Oral TID BM Pershing Proud, NP   563 mL at 01/12/18 2133  . finasteride (PROSCAR) tablet 5 mg  5 mg Oral Daily Loletha Grayer, MD   5 mg at 01/12/18 0947  . fluticasone (FLONASE) 50 MCG/ACT nasal spray 2 spray  2 spray Each Nare Daily Loletha Grayer, MD   2 spray at 01/14/18 1243  . heparin injection 1,000-6,000 Units  1,000-6,000 Units CRRT PRN Kolluru, Sarath, MD      . heparin injection 5,000 Units  5,000 Units Subcutaneous Q12H Dustin Flock, MD   5,000 Units at 01/15/18 2152  . [START ON 01/17/2018] levothyroxine (SYNTHROID, LEVOTHROID) tablet 25 mcg  25 mcg Oral QAC breakfast Conforti, John, DO      . lidocaine (XYLOCAINE) 2 % viscous mouth solution 15 mL  15 mL Mouth/Throat Q6H Dustin Flock, MD   15 mL at 01/14/18 1243  . magic mouthwash  15 mL Oral QID Pershing Proud, NP   15 mL at 01/14/18 1243  . megestrol (MEGACE) 400 MG/10ML suspension 200 mg  200 mg Oral QHS Loletha Grayer, MD   200 mg at 01/12/18 2136  . midodrine (PROAMATINE) tablet 20 mg  20 mg Oral TID WC Dustin Flock, MD   20 mg at 01/15/18 0932  . mirtazapine (REMERON) tablet 7.5 mg  7.5 mg Oral QHS Lateef, Munsoor, MD   7.5 mg at 01/12/18 2134  . multivitamin (RENA-VIT) tablet 1 tablet  1 tablet Oral QHS Loletha Grayer, MD   1 tablet at 01/12/18 2134  . norepinephrine (LEVOPHED) 16 mg in dextrose 5 % 250 mL (0.064 mg/mL) infusion  0-40 mcg/min Intravenous Titrated Dustin Flock, MD 9.4 mL/hr at 01/16/18 0854 10 mcg/min at 01/16/18 0854  . pureflow IV solution for Dialysis   CRRT Continuous Kolluru, Sarath, MD      . senna-docusate (Senokot-S) tablet 1 tablet  1 tablet Oral QHS PRN Wieting, Richard, MD      . sodium chloride (OCEAN) 0.65 % nasal spray 1 spray  1 spray Each Nare PRN Wieting, Richard, MD      .  vancomycin (VANCOCIN) IVPB 1000 mg/200 mL premix  1,000 mg Intravenous Q24H Conforti, John, DO      . vasopressin (PITRESSIN) 40 Units in sodium chloride 0.9 % 250 mL (0.16 Units/mL) infusion  0.03 Units/min Intravenous Continuous Conforti, John, DO 11.3 mL/hr at 01/15/18 1608 0.03 Units/min at 01/15/18 1608  .  Medications Prior to Admission  Medication Sig Dispense Refill  . amiodarone (PACERONE) 400 MG tablet Take 1 tablet (400 mg total) by mouth daily. 30 tablet 0  . aspirin EC 81 MG tablet Take 1 tablet (81 mg total) by mouth daily. 30 tablet 2  . atorvastatin (LIPITOR) 40 MG tablet Take 40 mg by mouth daily.  6  . Diphenhyd-Hydrocort-Nystatin (FIRST-DUKES MOUTHWASH MT) Take 5 mLs by mouth 4 (four) times daily.  0  . finasteride (PROSCAR) 5 MG tablet Take 5 mg by mouth daily.  11  . megestrol (MEGACE) 40 MG/ML suspension Take 5 mLs by mouth at bedtime.  11  . midodrine (PROAMATINE) 10 MG tablet Take 1 tablet (10 mg total) by mouth 3 (three) times daily with meals. 90 tablet 0  . multivitamin (RENA-VIT) TABS tablet Take 1 tablet by mouth at bedtime. 30 tablet 0  . multivitamin-lutein (OCUVITE-LUTEIN) CAPS capsule Take 1 capsule by mouth daily. 30 capsule 0  . omeprazole (PRILOSEC) 20 MG capsule Take 1 capsule (20 mg total) by mouth 2 (two) times daily. 60 capsule 1  . acetaminophen (TYLENOL) 325 MG tablet Take 2 tablets (650 mg total) by mouth every 6 (six) hours as needed for mild pain (or Fever >/= 101).    . benzonatate (TESSALON) 200 MG capsule Take 1 capsule (200 mg total) by mouth 3 (three) times daily as needed for cough. 20 capsule 0  . fluticasone (FLONASE) 50 MCG/ACT nasal spray Place 2 sprays into both nostrils daily. 1 g 0  . guaiFENesin (MUCINEX) 600 MG 12 hr tablet Take 1 tablet (600 mg total) by mouth 2 (two) times daily as needed. 30 tablet 0  . HYDROcodone-acetaminophen (NORCO/VICODIN) 5-325 MG tablet Take 1-2 tablets by mouth every 4 (four) hours as needed for moderate pain.  (Patient taking differently: Take 1 tablet by mouth every 4 (four) hours as needed for moderate pain. ) 20 tablet 0  . Nutritional Supplements (FEEDING SUPPLEMENT, NEPRO CARB STEADY,) LIQD Take 237 mLs by mouth 3 (three) times daily between meals. 21330 mL 0  . senna-docusate (SENOKOT-S) 8.6-50 MG tablet Take 1 tablet by mouth at bedtime as needed for mild constipation.    . sodium chloride (OCEAN) 0.65 % SOLN nasal spray Place 1  spray into both nostrils as needed for congestion. 1 Bottle 0    Allergies: No Known Allergies  PMH:  Past Medical History:  Diagnosis Date  . Arrhythmia    atrial fibrillation  . Arteriosclerosis of coronary artery 01/27/2014  . Benign essential HTN 12/11/2013  . Carpal tunnel syndrome   . Chronic kidney disease   . Coronary atherosclerosis of native coronary artery   . Disorder of mitral valve 12/11/2013  . Encounter for long-term (current) use of antiplatelets/antithrombotics   . Gout   . Hyperlipidemia   . Hypertension   . Lung nodule    found on xray in Aug 2018  . Mitral valve disorder   . Osteoarthritis   . Prostate cancer ([place])     Fam Hx:  Family History  Problem Relation Age of Onset  . Cancer Mother        stomach and uterus  . Cancer Sister        breast    Soc Hx:  Social History   Socioeconomic History  . Marital status: Married    Spouse name: Not on file  . Number of children: Not on file  . Years of education: Not on file  . Highest education level: Not on file  Occupational History  . Not on file  Social Needs  . Financial resource strain: Not on file  . Food insecurity:    Worry: Not on file    Inability: Not on file  . Transportation needs:    Medical: Not on file    Non-medical: Not on file  Tobacco Use  . Smoking status: Never Smoker  . Smokeless tobacco: Never Used  Substance and Sexual Activity  . Alcohol use: No  . Drug use: No  . Sexual activity: Not Currently  Lifestyle  . Physical activity:    Days per  week: Not on file    Minutes per session: Not on file  . Stress: Not on file  Relationships  . Social connections:    Talks on phone: Not on file    Gets together: Not on file    Attends religious service: Not on file    Active member of club or organization: Not on file    Attends meetings of clubs or organizations: Not on file    Relationship status: Not on file  . Intimate partner violence:    Fear of current or ex partner: Not on file    Emotionally abused: Not on file    Physically abused: Not on file    Forced sexual activity: Not on file  Other Topics Concern  . Not on file  Social History Narrative  . Not on file    PSH:  Past Surgical History:  Procedure Laterality Date  . COLONOSCOPY    . COLONOSCOPY WITH PROPOFOL N/A 07/23/2016   Procedure: COLONOSCOPY WITH PROPOFOL;  Surgeon: Manya Silvas, MD;  Location: [facility] ENDOSCOPY;  Service: Endoscopy;  Laterality: N/A;  . CORONARY ANGIOPLASTY WITH STENT PLACEMENT    . DIALYSIS/PERMA CATHETER INSERTION N/A 12/09/2017   Procedure: DIALYSIS/PERMA CATHETER INSERTION;  Surgeon: Algernon Huxley, MD;  Location: [place] CV LAB;  Service: Cardiovascular;  Laterality: N/A;  . DIALYSIS/PERMA CATHETER INSERTION N/A 12/16/2017   Procedure: DIALYSIS/PERMA CATHETER INSERTION;  Surgeon: Algernon Huxley, MD;  Location: [place] [facility];  Service: Cardiovascular;  Laterality: N/A;  . ESOPHAGOGASTRODUODENOSCOPY Left 07/28/2017   Procedure: ESOPHAGOGASTRODUODENOSCOPY (EGD);  Surgeon: Virgel Manifold, MD;  Location: [facility] ENDOSCOPY;  Service: Endoscopy;  Laterality: Left;  . ESOPHAGOGASTRODUODENOSCOPY (EGD) WITH PROPOFOL N/A 07/23/2016   Procedure: ESOPHAGOGASTRODUODENOSCOPY (EGD) WITH PROPOFOL;  Surgeon: Manya Silvas, MD;  Location[facility] ENDOSCOPY;  Service: Endoscopy;  Laterality: N/A;  . PROSTATE BIOPSY    . Procedures since admission: No admission procedures for hospital encounter.  ROS: Review of systems normal other than 12  systems except per HPI.  PHYSICAL EXAM  Vitals: Blood pressure 98/64, pulse 79, temperature 97.6 F (36.4 C), temperature source Axillary, resp. rate 20, height 5' 9"  (1.753 m), weight 156 lb 1.4 oz (70.8 kg), SpO2 96 %.. General: This elderly man, Obtunded Mood: Obtunded. Orientation: Obtunded, not oriented Vocal Quality: Not responsive. head and Face: NCAT. No facial asymmetry. No visible skin lesions. No significant facial scars. No tenderness with sinus percussion. Facial strength normal and symmetric. Ears: External ears with normal landmarks, no lesions. External auditory canals free of infection, cerumen impaction or lesions. Tympanic membranes intact with good landmarks and normal mobility on pneumatic otoscopy. No middle ear effusion. Hearing: Obtunded, unable to assess Nose: External nose normal with midline dorsum and no lesions or deformity. Nasal Cavity reveals essentially midline septum with normal inferior turbinates. No significant mucosal congestion or erythema. Nasal secretions are minimal and clear. No polyps seen on anterior rhinoscopy. Oral Cavity/ Oropharynx: Lips are normal with no lesions. Teeth no frank caries, has had partial extractions. Large shallow ulcer right oral tongue, 2.5-3 cm in size without raised edges and no palpable underlying deeper mass, immediately adjacent to posterior most mandibular premolar, with no molars present on that side. Gingiva healthy with no lesions or gingivitis. Oropharynx including tongue, buccal mucosa, floor of mouth, hard and soft palate, uvula and posterior pharynx free of exudates, erythema or lesions with normal symmetry, but dry mucous membranes from mouth breathing.  Indirect Laryngoscopy/Nasopharyngoscopy: Visualization of the larynx, hypopharynx and nasopharynx is not possible in this setting with routine examination. Neck: Supple and symmetric with no palpable masses, tenderness or crepitance. The trachea is midline. Thyroid gland  is soft, nontender and symmetric with no masses or enlargement. Parotid and submandibular glands are soft, nontender and symmetric, without masses. Lymphatic: Cervical lymph nodes are without palpable lymphadenopathy or tenderness. Respiratory: Normal respiratory effort without labored breathing. Cardiovascular: Carotid pulse shows regular rate and rhythm Neurologic: Obtunded, unable to assess Eyes: Obtunded, unable to assess  MEDICAL DECISION MAKING: Data Review:  Results for orders placed or performed during the hospital encounter of 12/11/2017 (from the past 48 hour(s))  CBC     Status: Abnormal   Collection Time: 01/15/18  9:32 AM  Result Value Ref Range   WBC 13.7 (H) 3.8 - 10.6 K/uL   RBC 3.69 (L) 4.40 - 5.90 MIL/uL   Hemoglobin 9.0 (L) 13.0 - 18.0 g/dL   HCT 28.2 (L) 40.0 - 52.0 %   MCV 76.6 (L) 80.0 - 100.0 fL   MCH 24.4 (L) 26.0 - 34.0 pg   MCHC 31.9 (L) 32.0 - 36.0 g/dL   RDW 37.5 (H) 11.5 - 14.5 %   Platelets 158 150 - 440 K/uL    Comment: PLATELET COUNT CONFIRMED BY SMEAR Performed at [facility], [address], [place], [place] [address]   Basic metabolic panel     Status: Abnormal   Collection Time: 01/15/18  9:32 AM  Result Value Ref Range   Sodium 140 135 - 145 mmol/L   Potassium 3.7 3.5 - 5.1 mmol/L   Chloride 100 (L) 101 - 111 mmol/L  CO2 25 22 - 32 mmol/L   Glucose, Bld <20 (LL) 65 - 99 mg/dL    Comment: CRITICAL RESULT CALLED TO, READ BACK BY AND VERIFIED WITH JUAN RODRIGUEZ AT 1113 ON 01/15/2018 JJB    BUN 39 (H) 6 - 20 mg/dL   Creatinine, Ser 4.25 (H) 0.61 - 1.24 mg/dL   Calcium 7.9 (L) 8.9 - 10.3 mg/dL   GFR calc non Af Amer 12 (L) >60 mL/min   GFR calc Af Amer 14 (L) >60 mL/min    Comment: (NOTE) The eGFR has been calculated using the CKD EPI equation. This calculation has not been validated in all clinical situations. eGFR's persistently <60 mL/min signify possible Chronic Kidney Disease.    Anion gap 15 5 - 15    Comment: Performed at  [facility], [address] [place], [place] [address]  Hepatitis B surface antigen     Status: None   Collection Time: 01/15/18  9:45 AM  Result Value Ref Range   Hepatitis B Surface Ag Negative Negative    Comment: (NOTE) Performed At: [facility] [address] [place], [place] [id number] Rush Farmer MD [phone] Performed at [facility], [address] [place], [place] [address]   Glucose, capillary     Status: Abnormal   Collection Time: 01/15/18 11:32 AM  Result Value Ref Range   Glucose-Capillary <10 (LL) 65 - 99 mg/dL  Glucose, capillary     Status: None   Collection Time: 01/15/18 11:35 AM  Result Value Ref Range   Glucose-Capillary 89 65 - 99 mg/dL  Glucose, capillary     Status: Abnormal   Collection Time: 01/15/18  2:05 PM  Result Value Ref Range   Glucose-Capillary <10 (LL) 65 - 99 mg/dL  Glucose, capillary     Status: Abnormal   Collection Time: 01/15/18  2:07 PM  Result Value Ref Range   Glucose-Capillary <10 (LL) 65 - 99 mg/dL  Glucose, capillary     Status: None   Collection Time: 01/15/18  3:03 PM  Result Value Ref Range   Glucose-Capillary 89 65 - 99 mg/dL  Blood gas, arterial     Status: Abnormal   Collection Time: 01/15/18  4:00 PM  Result Value Ref Range   FIO2 0.36    Delivery systems NASAL CANNULA    pH, Arterial 7.46 (H) 7.350 - 7.450   pCO2 arterial 33 32.0 - 48.0 mmHg   pO2, Arterial 183 (H) 83.0 - 108.0 mmHg   Bicarbonate 23.5 20.0 - 28.0 mmol/L   Acid-base deficit 0.1 0.0 - 2.0 mmol/L   O2 Saturation 99.7 %   Patient temperature 37.0    Collection site REVIEWED BY    Sample type ARTERIAL DRAW    Allens test (pass/fail) PASS PASS    Comment: Performed at [facility], [address] [place], [place] [address]  Cortisol     Status: None   Collection Time: 01/15/18  4:22 PM  Result Value Ref Range   Cortisol, Plasma >100.0 ug/dL    Comment: RESULTS CONFIRMED BY MANUAL DILUTION Performed  at [facility], [address], [place], [place] [address]   Thyroid Panel With TSH     Status: Abnormal   Collection Time: 01/15/18  4:22 PM  Result Value Ref Range   TSH 8.760 (H) 0.450 - 4.500 uIU/mL   T4, Total 5.8 4.5 - 12.0 ug/dL   T3 Uptake Ratio 40 (H) 24 - 39 %   Free Thyroxine  Index 2.3 1.2 - 4.9    Comment: (NOTE) Performed At: [facility] [place], [place] [id number] Rush Farmer MD [id number] Performed at [facility], [address] [place], [place] [address]   CULTURE, BLOOD (ROUTINE X 2) w Reflex to ID Panel     Status: None (Preliminary result)   Collection Time: 01/15/18  4:23 PM  Result Value Ref Range   Specimen Description BLOOD A-LINE DRAW    Special Requests      BOTTLES DRAWN AEROBIC AND ANAEROBIC Blood Culture adequate volume   Culture      NO GROWTH < 24 HOURS Performed at [facility], [address] [place], [place] [address]    Report Status PENDING   CULTURE, BLOOD (ROUTINE X 2) w Reflex to ID Panel     Status: None (Preliminary result)   Collection Time: 01/15/18  5:38 PM  Result Value Ref Range   Specimen Description BLOOD BLOOD LEFT WRIST    Special Requests      BOTTLES DRAWN AEROBIC AND ANAEROBIC Blood Culture results may not be optimal due to an inadequate volume of blood received in culture bottles   Culture      NO GROWTH < 12 HOURS Performed at [facility], [address], [place], [place] [address]    Report Status PENDING   Glucose, capillary     Status: Abnormal   Collection Time: 01/15/18  5:39 PM  Result Value Ref Range   Glucose-Capillary 196 (H) 65 - 99 mg/dL  MRSA PCR Screening     Status: None   Collection Time: 01/15/18  6:44 PM  Result Value Ref Range   MRSA by PCR NEGATIVE NEGATIVE    Comment:        The GeneXpert MRSA Assay (FDA approved for NASAL specimens only), is one component of a comprehensive MRSA colonization surveillance program. It is  not intended to diagnose MRSA infection nor to guide or monitor treatment for MRSA infections. Performed at [facility], [address] [place], [place] [address]   Glucose, capillary     Status: Abnormal   Collection Time: 01/15/18  8:17 PM  Result Value Ref Range   Glucose-Capillary 256 (H) 65 - 99 mg/dL  Glucose, capillary     Status: Abnormal   Collection Time: 01/15/18 10:09 PM  Result Value Ref Range   Glucose-Capillary 315 (H) 65 - 99 mg/dL  Glucose, capillary     Status: Abnormal   Collection Time: 01/15/18 11:54 PM  Result Value Ref Range   Glucose-Capillary 291 (H) 65 - 99 mg/dL   Comment 1 Notify RN   Glucose, capillary     Status: Abnormal   Collection Time: 01/16/18  2:11 AM  Result Value Ref Range   Glucose-Capillary 272 (H) 65 - 99 mg/dL  Glucose, capillary     Status: Abnormal   Collection Time: 01/16/18  4:09 AM  Result Value Ref Range   Glucose-Capillary 314 (H) 65 - 99 mg/dL   Comment 1 Notify RN   CBC with Differential/Platelet     Status: Abnormal   Collection Time: 01/16/18  5:36 AM  Result Value Ref Range   WBC 11.4 (H) 3.8 - 10.6 K/uL   RBC 3.71 (L) 4.40 - 5.90 MIL/uL   Hemoglobin 9.2 (L) 13.0 - 18.0 g/dL   HCT 28.8 (L) 40.0 - 52.0 %   MCV 77.7 (L) 80.0 - 100.0 fL   MCH 24.9 (L) 26.0 - 34.0 pg  MCHC 32.1 32.0 - 36.0 g/dL   RDW 36.7 (H) 11.5 - 14.5 %   Platelets 146 (L) 150 - 440 K/uL    Comment: PLATELET COUNT CONFIRMED BY SMEAR   Neutrophils Relative % 92 %   Neutro Abs 10.4 (H) 1.4 - 6.5 K/uL   Lymphocytes Relative 5 %   Lymphs Abs 0.6 (L) 1.0 - 3.6 K/uL   Monocytes Relative 3 %   Monocytes Absolute 0.4 0.2 - 1.0 K/uL   Eosinophils Relative 0 %   Eosinophils Absolute 0.0 0 - 0.7 K/uL   Basophils Relative 0 %   Basophils Absolute 0.0 0 - 0.1 K/uL    Comment: Performed at [facility], [address] [place], [place] [address]  Basic metabolic panel     Status: Abnormal   Collection Time: 01/16/18  5:36 AM  Result  Value Ref Range   Sodium 135 135 - 145 mmol/L   Potassium 4.8 3.5 - 5.1 mmol/L   Chloride 99 (L) 101 - 111 mmol/L   CO2 22 22 - 32 mmol/L   Glucose, Bld 322 (H) 65 - 99 mg/dL   BUN 32 (H) 6 - 20 mg/dL   Creatinine, Ser 3.24 (H) 0.61 - 1.24 mg/dL   Calcium 7.5 (L) 8.9 - 10.3 mg/dL   GFR calc non Af Amer 16 (L) >60 mL/min   GFR calc Af Amer 19 (L) >60 mL/min    Comment: (NOTE) The eGFR has been calculated using the CKD EPI equation. This calculation has not been validated in all clinical situations. eGFR's persistently <60 mL/min signify possible Chronic Kidney Disease.    Anion gap 14 5 - 15    Comment: Performed at [facility], [address] [place], [place] [address]  Glucose, capillary     Status: Abnormal   Collection Time: 01/16/18  6:00 AM  Result Value Ref Range   Glucose-Capillary 303 (H) 65 - 99 mg/dL   Comment 1 Notify RN   Glucose, capillary     Status: Abnormal   Collection Time: 01/16/18  8:18 AM  Result Value Ref Range   Glucose-Capillary 255 (H) 65 - 99 mg/dL  . No results found..   ASSESSMENT: Patient with large but shallow ulcer right oral tongue without evidence of associated tongue mass. Most likely this is eroded from dental trauma where his lower premolars have injured the tongue. I discussed options with the family of a biopsy to exclude cancer. It is impossible to tell how long this lesion has been present, as the patient is not responsive enough to relay history and family is unsure.   PLAN: Family declines biopsy at this time given his poor health. They do not relay a history of smoking or alcohol abuse which might put him at higher risk for oral cancer.  If he becomes more conscious, could consider trial of [facility] Magic Mouthwash to reduce inflammation. I also discussed with them that I am happy to see him after discharge to recheck the lesion and again consider biopsy to exclude carcinoma if it is not healing. For now would recommend humidity  such as a face bucket to reduce drying and oral care.    Riley Nearing, MD 01/16/2018 12:06 PM

## 2018-01-16 NOTE — Progress Notes (Signed)
Nutrition Follow-up  DOCUMENTATION CODES:   Severe malnutrition in context of chronic illness  INTERVENTION:  Once NGT placed and confirmed, initiate Nepro 1.8 Cal at 20 mL/hr and advance by 10 mL/hr every 8 hours to goal rate of 50 mL/hr (1200 mL goal daily volume) + Pro-Stat 30 mL BID. Provides 2360 kcal, 127 grams of protein, 876 mL H2O daily.  Of note, enteral nutrition is not found to increase quality or quantity of life when patients are at EOL. However, family is requesting full scope of care at this time, so short-term nutrition can be appropriate in this setting to see if patient responds. Patient was downgraded from a dysphagia 1 diet to NPO due to risk of aspiration. NGT feeding does not reduce risk of aspiration. If there is concern for aspiration need to consider placement of naso-jejunal tube.  Provide Juven po BID per tube, each supplement provides 80 kcal, 14 grams of amino acids, 300 mg vitamin C, 9.5 mg zinc, and other nutrients important for wound healing.  Provide B-complex with C daily per tube.  Provide Ocuvite daily per tube.  Will discontinue Rena-vite for now as it cannot be provided per tube.  Monitor magnesium, potassium, and phosphorus daily for at least 3 days, MD to replete as needed, as pt is at risk for refeeding syndrome given severe malnutrition.  NUTRITION DIAGNOSIS:   Severe Malnutrition related to chronic illness(ESRD on HD) as evidenced by severe muscle depletion, energy intake < 75% for > or equal to 1 month.  Ongoing - addressing with initiation of tube feeds.  GOAL:   Patient will meet greater than or equal to 90% of their needs  Not met at this time. Will be starting tube feeds today.  MONITOR:   Labs, Diet advancement, Weight trends, PO intake, Supplement acceptance, Skin, I & O's  REASON FOR ASSESSMENT:   Malnutrition Screening Tool    ASSESSMENT:   Andrew [name]  is a 84 y.o. male was over at dialysis and ended up getting dizzy.   The patient states that his speech has not been clear for 4 days.  He has had some intermittent nausea and vomiting and some belching.  At dialysis today he was choking and then had decreased responsiveness.  He was given oxygen and and came back.  Patient feels weak.  In the ER, the ER physician was concerned about pneumonia and started aggressive antibiotics.  The patient was recently discharged from the hospital and was treated on that stay for pneumonia in the same spot with Levaquin p.o. patient does not complain of chest pain or shortness of breath forward no cough.   -Following SLP assessment on 5/31 patient was placed on dysphagia 1 diet with thin liquids. -He was then made NPO on 6/5 due to risk for aspiration.  -GI assessed patient on 6/4 for placement of G-tube. Recommendation was to hold off on G-tube placement as risks outweigh benefits at this time and to instead place naso-jejunal tube. -On 6/5 HD had to be stopped after 2.5 hours due to hypotension, hypoglycemia, and AMS. He was later transferred to ICU. -ENT was consulted today for ulcer on tongue. Per ENT this ulcer is possibly from dental trauma. Family declines biopsy at this time due to poor health.  Patient is known to this RD from a previous hospital admission. He has been followed by RD during this admission and meets criteria for severe chronic malnutrition. Meal completion has not been documented regularly, but per RN notes  in chart patient has had very poor appetite and has been struggling with swallowing, even on dysphagia diet. It also appears he has not been able to tolerate much UF during HD due to hypotension. It seems family wants to pursue full aggressive care including feeding tube, though multiple MDs have documented that patient is likely at end of life. Patient has been placed on Precedex gtt as he was previously agitated. Patient sleeping at time of RD assessment and no family members present. Patient will be started  CRRT today. Abdomen feels soft at this time.  Pending placement of NGT today.  Medications reviewed and include: levothyroxine, Megace 200 mg QHS, Remeron 7.5 mg QHS, Unasyn, Precedex gtt, famotidine, Levophed gtt at 12 mcg/min, vancomycin.  Labs reviewed: CBG <10-315 past 24 hrs, CO2 20, BUN 36 (trending up), Creatinine 3.33 (trending up), Phosphorus 4.7.  I/O: pt appears to be anuric as there is no UOP documented, 237 mL removed from HD yesterday  No weight to trend since 6/3.  Discussed with RN and on rounds. Plan is to place NGT today to start tube feeding. RD received verbal consult to start tube feeds. Patient starting on CRRT today.  Diet Order:   Diet Order           Diet NPO time specified  Diet effective now          EDUCATION NEEDS:   Education needs have been addressed  Skin:  Skin Assessment: Skin Integrity Issues: Skin Integrity Issues:: Other (Comment) Other: 5 open wounds to sacrum and coccyx area  Last BM:  PTA  Height:   Ht Readings from Last 1 Encounters:  12/16/2017 _0  (1.753 m)    Weight:   Wt Readings from Last 1 Encounters:  01/13/18 156 lb 1.4 oz (70.8 kg)    Ideal Body Weight:  72.7 kg  BMI:  Body mass index is 23.05 kg/m.  Estimated Nutritional Needs:   Kcal:  2100-2300 (30-32 kcal/kg)  Protein:  120-135 grams (1.7-1.9 grams/kg on CRRT)  Fluid:  UOP + 1L  Willey Blade, MS, RD, LDN Office: [phone] Pager: [phone] After Hours/Weekend Pager: [phone]

## 2018-01-16 NOTE — Progress Notes (Addendum)
SLP Cancellation Note  Patient Details Name: Andrew [name] MRN: [id number] DOB: 1933-01-07   Cancelled treatment:       Reason Eval/Treat Not Completed: Patient not medically ready(chart reviewed; pt has transferred to CCU d/t status.). Noted Palliative Care note and family's wishes for aggressive care w/ alternative means of feeding(noted NG discussed yesterday).  ST services will sign off at this time d/t change/decline in status w/ MD to reconsult if pt's status improves sufficiently for reconsult for oral intake. Pt had been on a dysphagia level 1 diet w/ thin liquids but little-no oral intake was being accepted by pt per NSG report. Recommend frequent oral care for hygiene and stimulation of swallowing.     Orinda Kenner, MS, CCC-SLP Litzi Binning 01/16/2018, 8:40 AM

## 2018-01-16 NOTE — Progress Notes (Signed)
Called lab to check on repeat BMP results as it had been an hour since tube was sent down. Lab stated that they hadn't run it and stated it would be put in the centrifuge now and she would call my ascom with results in roughly 20 minutes.

## 2018-01-17 LAB — RENAL FUNCTION PANEL
ALBUMIN: 1.9 g/dL — AB (ref 3.5–5.0)
ANION GAP: 9 (ref 5–15)
ANION GAP: 12 (ref 5–15)
Albumin: 1.8 g/dL — ABNORMAL LOW (ref 3.5–5.0)
Albumin: 1.9 g/dL — ABNORMAL LOW (ref 3.5–5.0)
Albumin: 1.7 g/dL — ABNORMAL LOW (ref 3.5–5.0)
Albumin: 1.6 g/dL — ABNORMAL LOW (ref 3.5–5.0)
Albumin: 1.4 g/dL — ABNORMAL LOW (ref 3.5–5.0)
Anion gap: 14 (ref 5–15)
Anion gap: 12 (ref 5–15)
Anion gap: 12 (ref 5–15)
Anion gap: 10 (ref 5–15)
BUN: 24 mg/dL — ABNORMAL HIGH (ref 6–20)
BUN: 23 mg/dL — ABNORMAL HIGH (ref 6–20)
BUN: 22 mg/dL — AB (ref 6–20)
BUN: 21 mg/dL — AB (ref 6–20)
BUN: 19 mg/dL (ref 6–20)
BUN: 19 mg/dL (ref 6–20)
CALCIUM: 7.9 mg/dL — AB (ref 8.9–10.3)
CALCIUM: 7.6 mg/dL — AB (ref 8.9–10.3)
CALCIUM: 7.7 mg/dL — AB (ref 8.9–10.3)
CHLORIDE: 103 mmol/L (ref 101–111)
CHLORIDE: 102 mmol/L (ref 101–111)
CHLORIDE: 102 mmol/L (ref 101–111)
CHLORIDE: 103 mmol/L (ref 101–111)
CO2: 21 mmol/L — AB (ref 22–32)
CO2: 23 mmol/L (ref 22–32)
CO2: 22 mmol/L (ref 22–32)
CO2: 24 mmol/L (ref 22–32)
CO2: 23 mmol/L (ref 22–32)
CO2: 22 mmol/L (ref 22–32)
CREATININE: 2.07 mg/dL — AB (ref 0.61–1.24)
CREATININE: 1.91 mg/dL — AB (ref 0.61–1.24)
CREATININE: 1.84 mg/dL — AB (ref 0.61–1.24)
CREATININE: 1.46 mg/dL — AB (ref 0.61–1.24)
CREATININE: 1.42 mg/dL — AB (ref 0.61–1.24)
Calcium: 7.7 mg/dL — ABNORMAL LOW (ref 8.9–10.3)
Calcium: 7.8 mg/dL — ABNORMAL LOW (ref 8.9–10.3)
Calcium: 7.6 mg/dL — ABNORMAL LOW (ref 8.9–10.3)
Chloride: 102 mmol/L (ref 101–111)
Chloride: 103 mmol/L (ref 101–111)
Creatinine, Ser: 1.4 mg/dL — ABNORMAL HIGH (ref 0.61–1.24)
GFR calc Af Amer: 38 mL/min — ABNORMAL LOW (ref >60)
GFR calc Af Amer: 52 mL/min — ABNORMAL LOW (ref >60)
GFR calc Af Amer: 52 mL/min — ABNORMAL LOW (ref >60)
GFR calc non Af Amer: 28 mL/min — ABNORMAL LOW (ref >60)
GFR calc non Af Amer: 32 mL/min — ABNORMAL LOW (ref >60)
GFR calc non Af Amer: 43 mL/min — ABNORMAL LOW (ref >60)
GFR calc non Af Amer: 44 mL/min — ABNORMAL LOW (ref >60)
GFR calc non Af Amer: 45 mL/min — ABNORMAL LOW (ref >60)
GFR, EST AFRICAN AMERICAN: 33 mL/min — AB (ref >60)
GFR, EST AFRICAN AMERICAN: 36 mL/min — AB (ref >60)
GFR, EST AFRICAN AMERICAN: 50 mL/min — AB (ref >60)
GFR, EST NON AFRICAN AMERICAN: 31 mL/min — AB (ref >60)
GLUCOSE: 108 mg/dL — AB (ref 65–99)
GLUCOSE: 131 mg/dL — AB (ref 65–99)
GLUCOSE: 135 mg/dL — AB (ref 65–99)
GLUCOSE: 121 mg/dL — AB (ref 65–99)
Glucose, Bld: 147 mg/dL — ABNORMAL HIGH (ref 65–99)
Glucose, Bld: 136 mg/dL — ABNORMAL HIGH (ref 65–99)
PHOSPHORUS: 3.2 mg/dL (ref 2.5–4.6)
PHOSPHORUS: 2.7 mg/dL (ref 2.5–4.6)
POTASSIUM: 4.6 mmol/L (ref 3.5–5.1)
Phosphorus: 3.2 mg/dL (ref 2.5–4.6)
Phosphorus: 3.1 mg/dL (ref 2.5–4.6)
Phosphorus: 2.7 mg/dL (ref 2.5–4.6)
Phosphorus: 2.8 mg/dL (ref 2.5–4.6)
Potassium: 4 mmol/L (ref 3.5–5.1)
Potassium: 3.9 mmol/L (ref 3.5–5.1)
Potassium: 4.3 mmol/L (ref 3.5–5.1)
Potassium: 4 mmol/L (ref 3.5–5.1)
Potassium: 4 mmol/L (ref 3.5–5.1)
SODIUM: 137 mmol/L (ref 135–145)
SODIUM: 136 mmol/L (ref 135–145)
SODIUM: 135 mmol/L (ref 135–145)
Sodium: 138 mmol/L (ref 135–145)
Sodium: 136 mmol/L (ref 135–145)
Sodium: 137 mmol/L (ref 135–145)

## 2018-01-17 LAB — MAGNESIUM
MAGNESIUM: 1.7 mg/dL (ref 1.7–2.4)
MAGNESIUM: 2.2 mg/dL (ref 1.7–2.4)
Magnesium: 1.7 mg/dL (ref 1.7–2.4)
Magnesium: 1.7 mg/dL (ref 1.7–2.4)
Magnesium: 1.7 mg/dL (ref 1.7–2.4)
Magnesium: 2.2 mg/dL (ref 1.7–2.4)

## 2018-01-17 LAB — BLOOD GAS, ARTERIAL
Acid-base deficit: 4.4 mmol/L — ABNORMAL HIGH (ref 0.0–2.0)
BICARBONATE: 17.1 mmol/L — AB (ref 20.0–28.0)
FIO2: 0.28
O2 Saturation: 99.1 %
PH ART: 7.54 — AB (ref 7.350–7.450)
PO2 ART: 122 mmHg — AB (ref 83.0–108.0)
Patient temperature: 37
pCO2 arterial: 20 mmHg — ABNORMAL LOW (ref 32.0–48.0)

## 2018-01-17 LAB — CBC WITH DIFFERENTIAL/PLATELET
Basophils Absolute: 0 10*3/uL (ref 0–0.1)
Basophils Relative: 0 %
Eosinophils Absolute: 0 10*3/uL (ref 0–0.7)
Eosinophils Relative: 0 %
HEMATOCRIT: 26.5 % — AB (ref 40.0–52.0)
Hemoglobin: 8.3 g/dL — ABNORMAL LOW (ref 13.0–18.0)
LYMPHS PCT: 13 %
Lymphs Abs: 1.8 10*3/uL (ref 1.0–3.6)
MCH: 25.1 pg — ABNORMAL LOW (ref 26.0–34.0)
MCHC: 31.5 g/dL — AB (ref 32.0–36.0)
MCV: 79.7 fL — AB (ref 80.0–100.0)
MONO ABS: 0.6 10*3/uL (ref 0.2–1.0)
MONOS PCT: 4 %
NEUTROS ABS: 10.7 10*3/uL — AB (ref 1.4–6.5)
Neutrophils Relative %: 83 %
Platelets: 99 10*3/uL — ABNORMAL LOW (ref 150–440)
RBC: 3.32 MIL/uL — ABNORMAL LOW (ref 4.40–5.90)
RDW: 37 % — AB (ref 11.5–14.5)
WBC: 13 10*3/uL — ABNORMAL HIGH (ref 3.8–10.6)

## 2018-01-17 LAB — BASIC METABOLIC PANEL
Anion gap: 12 (ref 5–15)
BUN: 24 mg/dL — AB (ref 6–20)
CHLORIDE: 102 mmol/L (ref 101–111)
CO2: 23 mmol/L (ref 22–32)
Calcium: 7.9 mg/dL — ABNORMAL LOW (ref 8.9–10.3)
Creatinine, Ser: 2.04 mg/dL — ABNORMAL HIGH (ref 0.61–1.24)
GFR calc Af Amer: 33 mL/min — ABNORMAL LOW (ref >60)
GFR, EST NON AFRICAN AMERICAN: 29 mL/min — AB (ref >60)
Glucose, Bld: 150 mg/dL — ABNORMAL HIGH (ref 65–99)
POTASSIUM: 4 mmol/L (ref 3.5–5.1)
Sodium: 137 mmol/L (ref 135–145)

## 2018-01-17 LAB — CBC
HEMATOCRIT: 31.9 % — AB (ref 40.0–52.0)
Hemoglobin: 10.2 g/dL — ABNORMAL LOW (ref 13.0–18.0)
MCH: 24.9 pg — ABNORMAL LOW (ref 26.0–34.0)
MCHC: 32 g/dL (ref 32.0–36.0)
MCV: 77.8 fL — AB (ref 80.0–100.0)
PLATELETS: 135 10*3/uL — AB (ref 150–440)
RBC: 4.1 MIL/uL — ABNORMAL LOW (ref 4.40–5.90)
RDW: 37 % — AB (ref 11.5–14.5)
WBC: 14.4 10*3/uL — ABNORMAL HIGH (ref 3.8–10.6)

## 2018-01-17 LAB — GLUCOSE, CAPILLARY
GLUCOSE-CAPILLARY: 121 mg/dL — AB (ref 65–99)
GLUCOSE-CAPILLARY: 128 mg/dL — AB (ref 65–99)
GLUCOSE-CAPILLARY: 131 mg/dL — AB (ref 65–99)
GLUCOSE-CAPILLARY: 144 mg/dL — AB (ref 65–99)
GLUCOSE-CAPILLARY: 82 mg/dL (ref 65–99)
GLUCOSE-CAPILLARY: 86 mg/dL (ref 65–99)
GLUCOSE-CAPILLARY: 86 mg/dL (ref 65–99)
Glucose-Capillary: 108 mg/dL — ABNORMAL HIGH (ref 65–99)
Glucose-Capillary: 130 mg/dL — ABNORMAL HIGH (ref 65–99)
Glucose-Capillary: 130 mg/dL — ABNORMAL HIGH (ref 65–99)
Glucose-Capillary: 50 mg/dL — ABNORMAL LOW (ref 65–99)
Glucose-Capillary: 64 mg/dL — ABNORMAL LOW (ref 65–99)

## 2018-01-17 LAB — PROTIME-INR
INR: 1.62
Prothrombin Time: 19.1 seconds — ABNORMAL HIGH (ref 11.4–15.2)

## 2018-01-17 LAB — APTT
APTT: 34 s (ref 24–36)
APTT: 45 s — AB (ref 24–36)

## 2018-01-17 LAB — PREPARE RBC (CROSSMATCH)

## 2018-01-17 MED ORDER — SODIUM CHLORIDE 0.9 % IV SOLN
80.0000 mg | Freq: Once | INTRAVENOUS | Status: AC
  Administered 2018-01-17: 80 mg via INTRAVENOUS
  Filled 2018-01-17: qty 80

## 2018-01-17 MED ORDER — SODIUM CHLORIDE 0.9 % IV SOLN
Freq: Once | INTRAVENOUS | Status: AC
  Administered 2018-01-17: 22:00:00 via INTRAVENOUS

## 2018-01-17 MED ORDER — DEXTROSE 5 % IV SOLN
INTRAVENOUS | Status: DC
  Administered 2018-01-17: 11:00:00 via INTRAVENOUS

## 2018-01-17 MED ORDER — PANTOPRAZOLE SODIUM 40 MG IV SOLR: 40.0000 mg | Freq: Two times a day (BID) | INTRAVENOUS | Status: DC

## 2018-01-17 MED ORDER — PUREFLOW DIALYSIS SOLUTION
INTRAVENOUS | Status: DC
  Administered 2018-01-17 (×2): via INTRAVENOUS_CENTRAL

## 2018-01-17 MED ORDER — DEXTROSE 50 % IV SOLN
1.0000 | Freq: Once | INTRAVENOUS | Status: DC
  Administered 2018-01-17: 50 mL via INTRAVENOUS

## 2018-01-17 MED ORDER — SODIUM BICARBONATE 8.4 % IV SOLN
100.0000 meq | Freq: Once | INTRAVENOUS | Status: AC
  Administered 2018-01-17: 100 meq via INTRAVENOUS
  Filled 2018-01-17: qty 50

## 2018-01-17 MED ORDER — SODIUM BICARBONATE 8.4 % IV SOLN
INTRAVENOUS | Status: DC
  Administered 2018-01-17: 22:00:00 via INTRAVENOUS
  Filled 2018-01-17 (×2): qty 100

## 2018-01-17 MED ORDER — MAGNESIUM SULFATE 2 GM/50ML IV SOLN
2.0000 g | Freq: Once | INTRAVENOUS | Status: AC
  Administered 2018-01-17: 2 g via INTRAVENOUS
  Filled 2018-01-17: qty 50

## 2018-01-17 MED ORDER — HEPARIN SODIUM (PORCINE) 5000 UNIT/ML IJ SOLN
5000.0000 [IU] | Freq: Three times a day (TID) | INTRAMUSCULAR | Status: DC
  Administered 2018-01-17: 5000 [IU] via SUBCUTANEOUS
  Filled 2018-01-17: qty 1

## 2018-01-17 MED ORDER — VANCOMYCIN HCL IN DEXTROSE 1-5 GM/200ML-% IV SOLN
1000.0000 mg | INTRAVENOUS | Status: DC
  Filled 2018-01-17: qty 200

## 2018-01-17 MED ORDER — SODIUM CHLORIDE 0.9 % IV SOLN
8.0000 mg/h | INTRAVENOUS | Status: DC
  Administered 2018-01-17: 8 mg/h via INTRAVENOUS
  Filled 2018-01-17 (×2): qty 80

## 2018-01-17 MED ORDER — DEXTROSE 50 % IV SOLN
INTRAVENOUS | Status: AC
  Administered 2018-01-17: 50 mL via INTRAVENOUS
  Filled 2018-01-17: qty 50

## 2018-01-17 MED ORDER — DEXTROSE 10 % IV SOLN
INTRAVENOUS | Status: DC
  Administered 2018-01-17: 22:00:00 via INTRAVENOUS

## 2018-01-17 MED ORDER — SODIUM CHLORIDE 0.9 % IV SOLN
Freq: Once | INTRAVENOUS | Status: AC
  Administered 2018-01-18: 01:00:00 via INTRAVENOUS

## 2018-01-17 NOTE — Progress Notes (Signed)
[facility]  ROUNDING NOTE   Subjective:   Daughter at bedside.   NGT placed to suction  Vasopressin and norepinephrine  CRRT - changed to 4K bath. UF of 18mL/hr  Objective:  Vital signs in last 24 hours:  Temp:  [95.4 F (35.2 C)-98.1 F (36.7 C)] 97.5 F (36.4 C) (06/07 0630) Pulse Rate:  [68-82] 77 (06/07 0630) Resp:  [2-34] 2 (06/07 0630) BP: (89-143)/(55-110) 99/62 (06/07 0630) SpO2:  [88 %-100 %] 100 % (06/07 0630) Weight:  [66 kg (145 lb 8.1 oz)-67 kg (147 lb 11.3 oz)] 67 kg (147 lb 11.3 oz) (06/07 0500)  Weight change:  Filed Weights   01/13/18 1251 01/16/18 1706 01/17/18 0500  Weight: 70.8 kg (156 lb 1.4 oz) 66 kg (145 lb 8.1 oz) 67 kg (147 lb 11.3 oz)    Intake/Output: I/O last 3 completed shifts: In: 2173.1 [I.V.:1623.1; IV Piggyback:550] Out: 400 [Emesis/NG output:50; Other:350]   Intake/Output this shift:  No intake/output data recorded.  Physical Exam: General: Lethargic, critically ill  Head: Dental caries, right tongue lesion >1cm  Eyes: Anicteric, PERRL  Neck: Supple, trachea midline  Lungs:  Bilateral crackles, 2L [place]  Heart: regular  Abdomen:  Hypoactive bowel sounds  Extremities: + peripheral edema.   Neurologic: sedated  Skin: Warm, dry, no rash  Access:  Right internal jugular permcath    Basic Metabolic Panel: Recent Labs  Lab 01/16/18 1717 01/16/18 2125 01/16/18 2131 01/17/18 0156 01/17/18 0503  NA 137 138 137 138  137 137  K 4.3 5.2* 4.2 4.0  4.0 3.9  CL 101 101 102 103  102 102  CO2 22 21* 20* 21*  23 23  GLUCOSE 200* 168* 162* 147*  150* 136*  BUN 29* 26* 26* 24*  24* 23*  CREATININE 2.67* 2.15* 2.26* 2.07*  2.04* 1.91*  CALCIUM 8.0* 7.9* 7.8* 7.7*  7.9* 7.9*  MG 1.8 1.8 1.7 1.7 1.7  PHOS 4.1 3.9 3.5 3.2 3.2    Liver Function Tests: Recent Labs  Lab 01/13/18 1719  01/16/18 1717 01/16/18 2125 01/16/18 2131 01/17/18 0156 01/17/18 0503  AST 186*  --   --   --   --   --   --   ALT 152*  --    --   --   --   --   --   ALKPHOS 102  --   --   --   --   --   --   BILITOT 3.4*  --   --   --   --   --   --   PROT 4.7*  --   --   --   --   --   --   ALBUMIN 2.2*   < > 2.0* 1.8* 1.8* 1.8* 1.9*   < > = values in this interval not displayed.   No results for input(s): LIPASE, AMYLASE in the last 168 hours. No results for input(s): AMMONIA in the last 168 hours.  CBC: Recent Labs  Lab 01/13/18 1011 01/14/18 0609 01/15/18 0932 01/16/18 0536  WBC 16.9* 15.6* 13.7* 11.4*  NEUTROABS 15.4* 13.4*  --  10.4*  HGB 9.5* 8.8* 9.0* 9.2*  HCT 30.0* 27.2* 28.2* 28.8*  MCV 76.0* 76.1* 76.6* 77.7*  PLT 211 165 158 146*    Cardiac Enzymes: Recent Labs  Lab 01/02/2018 1245 01/07/2018 1759  TROPONINI 0.57* 0.56*    BNP: Invalid input(s): POCBNP  CBG: Recent Labs  Lab 01/16/18 2337 01/17/18 0154 01/17/18  6578 01/17/18 0551 01/17/18 0726  GLUCAP 119* 144* 128* 108* 77    Microbiology: Results for orders placed or performed during the hospital encounter of 01/09/2018  Blood Culture (routine x 2)     Status: None   Collection Time: 12/17/2017 10:06 AM  Result Value Ref Range Status   Specimen Description BLOOD LEFT ARM  Final   Special Requests   Final    BOTTLES DRAWN AEROBIC AND ANAEROBIC Blood Culture results may not be optimal due to an inadequate volume of blood received in culture bottles   Culture   Final    NO GROWTH 5 DAYS Performed at [facility], [address] [place], [place] [address]    Report Status 01/15/2018 FINAL  Final  Blood Culture (routine x 2)     Status: None   Collection Time: 12/22/2017 10:11 AM  Result Value Ref Range Status   Specimen Description BLOOD LEFT ARM  Final   Special Requests   Final    BOTTLES DRAWN AEROBIC AND ANAEROBIC Blood Culture results may not be optimal due to an inadequate volume of blood received in culture bottles   Culture   Final    NO GROWTH 5 DAYS Performed at [facility], [address]  [place], [place] [address]    Report Status 01/15/2018 FINAL  Final  MRSA PCR Screening     Status: None   Collection Time: 12/21/2017  2:30 PM  Result Value Ref Range Status   MRSA by PCR NEGATIVE NEGATIVE Final    Comment:        The GeneXpert MRSA Assay (FDA approved for NASAL specimens only), is one component of a comprehensive MRSA colonization surveillance program. It is not intended to diagnose MRSA infection nor to guide or monitor treatment for MRSA infections. Performed at [facility], [address] [place], [place] [address]   CULTURE, BLOOD (ROUTINE X 2) w Reflex to ID Panel     Status: None (Preliminary result)   Collection Time: 01/15/18  4:23 PM  Result Value Ref Range Status   Specimen Description BLOOD A-LINE DRAW  Final   Special Requests   Final    BOTTLES DRAWN AEROBIC AND ANAEROBIC Blood Culture adequate volume   Culture   Final    NO GROWTH 2 DAYS Performed at [facility], [address], [place], [place] [address]    Report Status PENDING  Incomplete  CULTURE, BLOOD (ROUTINE X 2) w Reflex to ID Panel     Status: None (Preliminary result)   Collection Time: 01/15/18  5:38 PM  Result Value Ref Range Status   Specimen Description BLOOD BLOOD LEFT WRIST  Final   Special Requests   Final    BOTTLES DRAWN AEROBIC AND ANAEROBIC Blood Culture results may not be optimal due to an inadequate volume of blood received in culture bottles   Culture   Final    NO GROWTH 2 DAYS Performed at [facility], [address] [place],  [address]    Report Status PENDING  Incomplete  MRSA PCR Screening     Status: None   Collection Time: 01/15/18  6:44 PM  Result Value Ref Range Status   MRSA by PCR NEGATIVE NEGATIVE Final    Comment:        The GeneXpert MRSA Assay (FDA approved for NASAL specimens only), is one component of a comprehensive MRSA colonization surveillance program. It is not intended to diagnose MRSA infection nor to  guide or monitor treatment for MRSA infections. Performed at [facility], [address] [place], [place] [address]     Coagulation Studies: No results for input(s): LABPROT, INR in the last 72 hours.  Urinalysis: No results for input(s): COLORURINE, LABSPEC, PHURINE, GLUCOSEU, HGBUR, BILIRUBINUR, KETONESUR, PROTEINUR, UROBILINOGEN, NITRITE, LEUKOCYTESUR in the last 72 hours.  Invalid input(s): APPERANCEUR    Imaging: Dg Abd 1 View  Result Date: 01/16/2018 CLINICAL DATA:  NG tube placement EXAM: ABDOMEN - 1 VIEW COMPARISON:  Chest x-ray 01/13/2018 FINDINGS: Nasogastric tube tip is identified within the RIGHT LOWER lobe bronchus. Significant artifact overlies the LOWER chest. There is persistent opacity in the LEFT LOWER lobe. There is gaseous distension of the stomach. Large and small bowel loops appear normal caliber. A RIGHT femoral line overlies L5. IMPRESSION: 1. Nasogastric tube to the RIGHT LOWER lobe bronchus. 2. Gaseous distension of the stomach. 3. Increased opacity in the LEFT LOWER lobe. 4. These results were called by telephone at the time of interpretation on 01/16/2018 at 4:38 pm to Dr. Merton Border , who verbally acknowledged these results. Electronically Signed   By: Nolon Nations M.D.   On: 01/16/2018 16:39   Dg Abd Portable 1v  Result Date: 01/16/2018 CLINICAL DATA:  NG tube placement EXAM: PORTABLE ABDOMEN - 1 VIEW COMPARISON:  01/16/2018 at 1605 hours FINDINGS: Enteric tube terminates in the proximal gastric body. Gaseous distention of multiple loops of small bowel in the central abdomen, raising concern for small bowel obstruction. Right groin catheter. IMPRESSION: Enteric tube terminates in the proximal gastric body. Electronically Signed   By: Julian Hy M.D.   On: 01/16/2018 17:14     Medications:   . ampicillin-sulbactam (UNASYN) IV Stopped (01/17/18 0551)  . dexmedetomidine (PRECEDEX) IV infusion 1 mcg/kg/hr (01/17/18 0548)  . dextrose    .  famotidine (PEPCID) IV Stopped (01/16/18 2157)  . norepinephrine 10 mcg/min (01/17/18 0553)  . pureflow 2,000 mL/hr at 01/17/18 0548  . vancomycin Stopped (01/16/18 1626)  . vasopressin (PITRESSIN) infusion - *FOR SHOCK* 0.03 Units/min (01/17/18 0400)   . amiodarone  200 mg Oral Daily  . aspirin EC  81 mg Oral Daily  . atorvastatin  40 mg Oral q1800  . B-complex with vitamin C  1 tablet Per Tube Daily  . chlorhexidine  15 mL Mouth Rinse BID  . Chlorhexidine Gluconate Cloth  6 each Topical Q0600  . collagenase   Topical Daily  . finasteride  5 mg Oral Daily  . fluticasone  2 spray Each Nare Daily  . heparin injection (subcutaneous)  5,000 Units Subcutaneous Q8H  . levothyroxine  25 mcg Oral QAC breakfast  . lidocaine  15 mL Mouth/Throat Q6H  . magic mouthwash  15 mL Oral QID  . mouth rinse  15 mL Mouth Rinse q12n4p  . mouth rinse  15 mL Mouth Rinse q12n4p  . megestrol  200 mg Oral QHS  . midodrine  20 mg Oral TID WC  . mirtazapine  7.5 mg Oral QHS  . multivitamin-lutein  1 capsule Per Tube Daily  . nutrition supplement (JUVEN)  1 packet Per Tube BID BM  . sodium chloride flush  10-40 mL Intracatheter Q12H   acetaminophen, benzonatate, heparin, morphine injection, senna-docusate, sodium chloride, sodium chloride flush  Assessment/ Plan:  Mr. INA POUPARD is a 84 y.o. black male with hypertension, hyperlipidemia, gout, ? history of prostate cancer, coronary artery disease, who was admitted to [facility] 12/27/2017 for hypoxia, AMS, pneumonia.   [facility]  MWF RIJ permcath  1. End Stage Renal disease: acute renal failure with limited recovery. Hemodialysis treatment yesterday was not tolerated well due to hypotension and hypoglycemia and worsening mental status Hemodynamically unstable this morning requiring two vasopressors.  - CVVHD therapy rate 2 liters BFR 350. Ultrafiltration to increase to 51mL/hr. May titrate up as hemodynamics allow  2.  Right lower lobe pneumonia. Concern for  progression to septic shock. Requiring vasopressors. Blood cultures pending.  Leukocytes are tending down. Afebrile.  - Empiric Unasyn and vanco  3. Hypotension: with acute exacerbation of systolic and diastolic congestive heart failure. With anasarca on examination.  Requiring vasopressors.  Unable to tolerate PO midodrine.   4.  Anemia of chronic kidney disease: hemoglobin 9.2 - EPO has not been given on this admission.   5. Secondary Hyperparathyroidism: not currently on binders. Unable to tolerate PO right now.   6. Nutrition with hypoalbuminemia. Now with possible ileus   LOS: 4 Andrew [name] 6/7/201910:21 AM

## 2018-01-17 NOTE — Progress Notes (Signed)
Follow up - Critical Care Medicine Note  Patient Details:    Andrew [name] is an 84 y.o. male.with a past medical history remarkable for hypertension, coronary artery disease, atrial fibrillation, hyperlipidemia, prostate cancer, valvular disease, end-stage renal disease on hemodialysis, was initially admitted after having difficulties over hemodialysis. He had nausea, vomiting, belching, change in mental status with choking and possible aspiration. He was subsequently admitted to the hospital. Unfortunately today hemodialysis was stopped after 2.5 hours secondary to hypotension, hypoglycemia and altered mental status. Patient has arrived in the intensive care unit   Lines, Airways, Drains: CVC Triple Lumen 01/15/18 Right Femoral (Active)  Indication for Insertion or Continuance of Line Vasoactive infusions 01/15/2018  8:00 PM  Site Assessment Clean;Dry;Intact 01/15/2018  8:00 PM  Proximal Lumen Status Infusing 01/15/2018  8:00 PM  Medial Lumen Status Infusing 01/15/2018  8:00 PM  Distal Lumen Status Infusing 01/15/2018  8:00 PM  Dressing Type Transparent 01/15/2018  8:00 PM  Dressing Status Intact 01/15/2018  8:00 PM  Line Care Connections checked and tightened 01/15/2018  8:00 PM  Dressing Change Due 01/22/18 01/15/2018  8:00 PM    Anti-infectives:  Anti-infectives (From admission, onward)   Start     Dose/Rate Route Frequency Ordered Stop   01/16/18 1127  Ampicillin-Sulbactam (UNASYN) 3 g in sodium chloride 0.9 % 100 mL IVPB     3 g 200 mL/hr over 30 Minutes Intravenous Every 6 hours 01/16/18 0957     01/16/18 1030  vancomycin (VANCOCIN) IVPB 1000 mg/200 mL premix     1,000 mg 200 mL/hr over 60 Minutes Intravenous Every 24 hours 01/16/18 1023     01/13/18 1700  Ampicillin-Sulbactam (UNASYN) 3 g in sodium chloride 0.9 % 100 mL IVPB  Status:  Discontinued     3 g 200 mL/hr over 30 Minutes Intravenous Every 12 hours 01/13/18 1450 01/16/18 0957   01/11/18 1000  ceFEPIme (MAXIPIME) 1 g in sodium chloride  0.9 % 100 mL IVPB  Status:  Discontinued     1 g 200 mL/hr over 30 Minutes Intravenous Every 24 hours 12/19/2017 1535 01/13/18 1443   01/07/2018 1700  vancomycin (VANCOCIN) 500 mg in sodium chloride 0.9 % 100 mL IVPB     500 mg 100 mL/hr over 60 Minutes Intravenous  Once 12/29/2017 1536 01/03/2018 1708   12/21/2017 1115  fluconazole (DIFLUCAN) tablet 100 mg  Status:  Discontinued     100 mg Oral Daily 12/24/2017 1112 01/14/18 1333   12/30/2017 1015  ceFEPIme (MAXIPIME) 2 g in sodium chloride 0.9 % 100 mL IVPB     2 g 200 mL/hr over 30 Minutes Intravenous  Once 01/09/2018 1005 12/31/2017 1127   12/28/2017 1015  vancomycin (VANCOCIN) IVPB 1000 mg/200 mL premix     1,000 mg 200 mL/hr over 60 Minutes Intravenous  Once 12/31/2017 1005 12/12/2017 1227      Microbiology: Results for orders placed or performed during the hospital encounter of 12/21/2017  Blood Culture (routine x 2)     Status: None   Collection Time: 01/06/2018 10:06 AM  Result Value Ref Range Status   Specimen Description BLOOD LEFT ARM  Final   Special Requests   Final    BOTTLES DRAWN AEROBIC AND ANAEROBIC Blood Culture results may not be optimal due to an inadequate volume of blood received in culture bottles   Culture   Final    NO GROWTH 5 DAYS Performed at [facility], [address] [place], [place] [address]  Report Status 01/15/2018 FINAL  Final  Blood Culture (routine x 2)     Status: None   Collection Time: 12/19/2017 10:11 AM  Result Value Ref Range Status   Specimen Description BLOOD LEFT ARM  Final   Special Requests   Final    BOTTLES DRAWN AEROBIC AND ANAEROBIC Blood Culture results may not be optimal due to an inadequate volume of blood received in culture bottles   Culture   Final    NO GROWTH 5 DAYS Performed at [facility], [address] [place], [place] [address]    Report Status 01/15/2018 FINAL  Final  MRSA PCR Screening     Status: None   Collection Time: 12/15/2017  2:30 PM  Result Value Ref  Range Status   MRSA by PCR NEGATIVE NEGATIVE Final    Comment:        The GeneXpert MRSA Assay (FDA approved for NASAL specimens only), is one component of a comprehensive MRSA colonization surveillance program. It is not intended to diagnose MRSA infection nor to guide or monitor treatment for MRSA infections. Performed at [facility], [address] [place], [place] [address]   CULTURE, BLOOD (ROUTINE X 2) w Reflex to ID Panel     Status: None (Preliminary result)   Collection Time: 01/15/18  4:23 PM  Result Value Ref Range Status   Specimen Description BLOOD A-LINE DRAW  Final   Special Requests   Final    BOTTLES DRAWN AEROBIC AND ANAEROBIC Blood Culture adequate volume   Culture   Final    NO GROWTH 2 DAYS Performed at [facility], [address], [place], [place] [address]    Report Status PENDING  Incomplete  CULTURE, BLOOD (ROUTINE X 2) w Reflex to ID Panel     Status: None (Preliminary result)   Collection Time: 01/15/18  5:38 PM  Result Value Ref Range Status   Specimen Description BLOOD BLOOD LEFT WRIST  Final   Special Requests   Final    BOTTLES DRAWN AEROBIC AND ANAEROBIC Blood Culture results may not be optimal due to an inadequate volume of blood received in culture bottles   Culture   Final    NO GROWTH 2 DAYS Performed at [facility], [address] [place], [place] [address]    Report Status PENDING  Incomplete  MRSA PCR Screening     Status: None   Collection Time: 01/15/18  6:44 PM  Result Value Ref Range Status   MRSA by PCR NEGATIVE NEGATIVE Final    Comment:        The GeneXpert MRSA Assay (FDA approved for NASAL specimens only), is one component of a comprehensive MRSA colonization surveillance program. It is not intended to diagnose MRSA infection nor to guide or monitor treatment for MRSA infections. Performed at [facility], [address] [place], [place] [address]    Events: patient was  transferred to the intensive care unit, central line was placed, started on vasopressin and norepinephrine, antibiotics include Unasyn and vancomycin  Studies: Dg Chest 2 View  Result Date: 12/26/2017 CLINICAL DATA:  Shortness of breath and weakness for several hours EXAM: CHEST - 2 VIEW COMPARISON:  12/20/2017 FINDINGS: Dialysis catheter is again identified. Stable cardiomegaly is seen. Small pleural effusions and bilateral lower lobe infiltrate/atelectasis is noted slightly greater on the right than the left. The overall appearance is similar to that seen on the prior exam. No new bony abnormality is seen. IMPRESSION: Overall  stable appearance of the chest with bilateral pleural effusions and lower lobe infiltrate/atelectasis. Electronically Signed   By: Inez Catalina M.D.   On: 12/26/2017 16:21   Dg Chest 2 View  Result Date: 12/20/2017 CLINICAL DATA:  Chest tightness with mild dyspnea. EXAM: CHEST - 2 VIEW COMPARISON:  12/01/2017 FINDINGS: Stable cardiomegaly with moderate aortic atherosclerosis. New bilateral small pleural effusions obscuring the diaphragms and spanning approximately of 1-1/2 to 2 vertebral body heights on the lateral view. New right IJ dialysis catheter tip terminates in the distal SVC. IMPRESSION: Cardiomegaly with new small bilateral pleural effusions. No overt pulmonary edema. New right IJ dialysis catheter is noted without apparent complication. Electronically Signed   By: Ashley Royalty M.D.   On: 12/20/2017 00:53   Dg Abd 1 View  Result Date: 01/16/2018 CLINICAL DATA:  NG tube placement EXAM: ABDOMEN - 1 VIEW COMPARISON:  Chest x-ray 01/13/2018 FINDINGS: Nasogastric tube tip is identified within the RIGHT LOWER lobe bronchus. Significant artifact overlies the LOWER chest. There is persistent opacity in the LEFT LOWER lobe. There is gaseous distension of the stomach. Large and small bowel loops appear normal caliber. A RIGHT femoral line overlies L5. IMPRESSION: 1. Nasogastric  tube to the RIGHT LOWER lobe bronchus. 2. Gaseous distension of the stomach. 3. Increased opacity in the LEFT LOWER lobe. 4. These results were called by telephone at the time of interpretation on 01/16/2018 at 4:38 pm to Dr. Merton Border , who verbally acknowledged these results. Electronically Signed   By: Nolon Nations M.D.   On: 01/16/2018 16:39   Mr Brain Wo Contrast  Result Date: 01/09/2018 CLINICAL DATA:  84 year old male dialysis patient with altered mental status, weakness, and slurred speech. EXAM: MRI HEAD WITHOUT CONTRAST TECHNIQUE: Multiplanar, multiecho pulse sequences of the brain and surrounding structures were obtained without intravenous contrast. COMPARISON:  None. FINDINGS: Brain: Generalized cerebral volume loss with ex vacuo appearing ventricular enlargement. No restricted diffusion to suggest acute infarction. No midline shift, mass effect, evidence of mass lesion, extra-axial collection or acute intracranial hemorrhage. Cervicomedullary junction and pituitary are within normal limits. Pearline Cables and white matter signal is within normal limits for age throughout the brain. No cortical encephalomalacia or definite chronic cerebral blood products. Signal in the deep gray matter nuclei, brainstem, and cerebellum is normal for age. Vascular: Major intracranial vascular flow voids are preserved. Skull and upper cervical spine: Negative for age visible cervical spine. Normal bone marrow signal. Sinuses/Orbits: Normal orbits soft tissues. Paranasal sinuses and mastoids are well pneumatized. Other: Visible internal auditory structures appear normal. Scalp and face soft tissues appear negative. IMPRESSION: No acute intracranial abnormality. Negative for age noncontrast MRI appearance of the brain. Electronically Signed   By: Genevie Ann M.D.   On: 01/06/2018 14:27   Dg Chest Port 1 View  Result Date: 01/13/2018 CLINICAL DATA:  Pneumonia. History of prostate cancer, hypertension and mitral valve  disorder. EXAM: PORTABLE CHEST 1 VIEW COMPARISON:  Radiographs 01/08/2018 and 12/26/2017. FINDINGS: 1452 hours. Right IJ hemodialysis catheters are unchanged near the superior cavoatrial junction. The heart size and mediastinal contours are stable. There are increased pleural effusions and right-greater-than-left basilar airspace opacities. There is probable mild edema. No pneumothorax. The bones appear unchanged. IMPRESSION: Worsening pleural effusions, bibasilar pulmonary opacities and probable edema. Electronically Signed   By: Richardean Sale M.D.   On: 01/13/2018 15:15   Dg Chest Port 1 View  Result Date: 12/12/2017 CLINICAL DATA:  Syncope. EXAM: PORTABLE CHEST 1 VIEW COMPARISON:  12/26/2017.  FINDINGS: Dual-lumen catheter with tip over superior vena cava. Cardiomegaly. Progressive right lower lobe infiltrate. Right pleural effusion again noted. No pneumothorax. IMPRESSION: 1.  Dual-lumen catheter with tip over superior vena cava. 2. Progressive right lower lobe infiltrate. Right pleural effusion again noted. Electronically Signed   By: Marcello Moores  Register   On: 12/14/2017 09:04   Dg Abd Portable 1v  Result Date: 01/16/2018 CLINICAL DATA:  NG tube placement EXAM: PORTABLE ABDOMEN - 1 VIEW COMPARISON:  01/16/2018 at 1605 hours FINDINGS: Enteric tube terminates in the proximal gastric body. Gaseous distention of multiple loops of small bowel in the central abdomen, raising concern for small bowel obstruction. Right groin catheter. IMPRESSION: Enteric tube terminates in the proximal gastric body. Electronically Signed   By: Julian Hy M.D.   On: 01/16/2018 17:14    Consults: Treatment Team:  Anthonette Legato, MD   Subjective:    Overnight Issues: no change overnight  Objective:  Vital signs for last 24 hours: Temp:  [95.4 F (35.2 C)-98.1 F (36.7 C)] 97.5 F (36.4 C) (06/07 0630) Pulse Rate:  [68-82] 77 (06/07 0630) Resp:  [2-34] 2 (06/07 0630) BP: (89-143)/(55-110) 99/62 (06/07  0630) SpO2:  [88 %-100 %] 100 % (06/07 0630) Weight:  [66 kg (145 lb 8.1 oz)-67 kg (147 lb 11.3 oz)] 67 kg (147 lb 11.3 oz) (06/07 0500)  Hemodynamic parameters for last 24 hours:    Intake/Output from previous day: 06/06 0701 - 06/07 0700 In: 707.5 [I.V.:707.5] Out: 400 [Emesis/NG output:50]  Intake/Output this shift: No intake/output data recorded.  Vent settings for last 24 hours:    Physical Exam:  Resting comfortably this morning Vital signs:       Please see the above listed vital signs HEENT:           Trachea is midline, no accessory muscle utilization, on nasal cannula, no distended neck veins, appears to have a lesion on the lateral aspect of his tongue will need to be addressed at some point Cardiovascular:           Regular rhythm, systolic murmur mitral post Pulmonary:      Crackles appreciated right greater than left, vascular access noted Abdominal:      Positive bowel sounds, soft exam Extremity:        No clubbing cyanosis or edema noted Neurologic:      Patient moves all extremities,is confused, shaking head, encephalopathic Cutaneous:      No rashes or lesions noted    Assessment/Plan:   End-stage renal failure. Patient presently on CRRT, has tolerated well so far. Still requiring vasopressin a 0.03 and norepinephrine at 10  Hypoglycemia. Resolved  Sepsis. Felt to be secondary to pneumonia, on Unasyn and vancomycin, monitor closely, support hemodynamics pending culture data  Leukocytosis Has improved on antibiotic therapy most recent is 11.4  Anemia. Stable no evidence of active bleeding  Tongue lesion. Dwill need ENT eventually to evaluate and possible biopsy  Critical care time 35 minutes   Alexcia Schools 01/17/2018  *Care during the described time interval was provided by me and/or other providers on the critical care team.  I have reviewed this patient's available data, including medical history, events of note, physical examination and test  results as part of my evaluation.

## 2018-01-17 NOTE — Clinical Social Work Note (Signed)
CSW continuing to follow. Physician is telling CSW that patient may not survive the weekend. Shela Leff MSW,LCSW [phone]

## 2018-01-17 NOTE — Progress Notes (Signed)
[place] [facility]                                                                                                                                                                                  Patient Demographics   Andrew [name], is a 84 y.o. male, DOB - 05-Jun-1933, DXA:[id number]  Admit date - 12/12/2017   Admitting Physician Loletha Grayer, MD  Outpatient Primary MD for the patient is [name], MD   LOS - 4  Subjective: Patient on 2 pressors has a warming blanket in place  Review of Systems:   CONSTITUTIONAL: Hard to arouse  Vitals:   Vitals:   01/17/18 0545 01/17/18 0600 01/17/18 0615 01/17/18 0630  BP: 105/61 (!) 96/58 93/61 99/62   Pulse: 79 75 77 77  Resp: 16 (!) 9 17 (!) 2  Temp: (!) 97.3 F (36.3 C) (!) 97.3 F (36.3 C) (!) 97.3 F (36.3 C) (!) 97.5 F (36.4 C)  TempSrc:      SpO2: 100% 100% 100% 100%  Weight:      Height:        Wt Readings from Last 3 Encounters:  01/17/18 67 kg (147 lb 11.3 oz)  12/26/17 73.5 kg (162 lb)  12/21/17 72.4 kg (159 lb 11.2 oz)     Intake/Output Summary (Last 24 hours) at 01/17/2018 1325 Last data filed at 01/17/2018 0500 Gross per 24 hour  Intake 707.46 ml  Output 400 ml  Net 307.46 ml    Physical Exam:   GENERAL: Critically ill-appearing HEAD, EYES, EARS, NOSE AND THROAT: Atraumatic, normocephalic.  Pupils equal and reactive to light. Sclerae anicteric. No conjunctival injection. No oro-pharyngeal erythema.  NECK: Supple. There is no jugular venous distention. No bruits, no lymphadenopathy, no thyromegaly.  HEART: Regular rate and rhythm,. No murmurs, no rubs, no clicks.  LUNGS: Rhonchus breath sounds bilaterally ABDOMEN: Soft, flat, nontender, nondistended. Has good bowel sounds. No hepatosplenomegaly appreciated.  EXTREMITIES: No evidence of any cyanosis, clubbing, or 2+ peripheral edema.  +2 pedal and radial pulses bilaterally.  NEUROLOGIC: Unresponsive sKIN: Moist and warm  with no rashes appreciated.  Psych: Unresponsive LN: No inguinal LN enlargement    Antibiotics   Anti-infectives (From admission, onward)   Start     Dose/Rate Route Frequency Ordered Stop   01/16/18 1127  Ampicillin-Sulbactam (UNASYN) 3 g in sodium chloride 0.9 % 100 mL IVPB     3 g 200 mL/hr over 30 Minutes Intravenous Every 6 hours 01/16/18 0957     01/16/18 1030  vancomycin (VANCOCIN) IVPB 1000 mg/200 mL premix     1,000 mg 200 mL/hr over 60 Minutes Intravenous Every 24 hours 01/16/18  1023     01/13/18 1700  Ampicillin-Sulbactam (UNASYN) 3 g in sodium chloride 0.9 % 100 mL IVPB  Status:  Discontinued     3 g 200 mL/hr over 30 Minutes Intravenous Every 12 hours 01/13/18 1450 01/16/18 0957   01/11/18 1000  ceFEPIme (MAXIPIME) 1 g in sodium chloride 0.9 % 100 mL IVPB  Status:  Discontinued     1 g 200 mL/hr over 30 Minutes Intravenous Every 24 hours 12/23/2017 1535 01/13/18 1443   12/13/2017 1700  vancomycin (VANCOCIN) 500 mg in sodium chloride 0.9 % 100 mL IVPB     500 mg 100 mL/hr over 60 Minutes Intravenous  Once 12/25/2017 1536 12/25/2017 1708   12/11/2017 1115  fluconazole (DIFLUCAN) tablet 100 mg  Status:  Discontinued     100 mg Oral Daily 01/01/2018 1112 01/14/18 1333   01/08/2018 1015  ceFEPIme (MAXIPIME) 2 g in sodium chloride 0.9 % 100 mL IVPB     2 g 200 mL/hr over 30 Minutes Intravenous  Once 01/08/2018 1005 12/11/2017 1127   01/07/2018 1015  vancomycin (VANCOCIN) IVPB 1000 mg/200 mL premix     1,000 mg 200 mL/hr over 60 Minutes Intravenous  Once 12/25/2017 1005 12/21/2017 1227      Medications   Scheduled Meds: . amiodarone  200 mg Oral Daily  . aspirin EC  81 mg Oral Daily  . atorvastatin  40 mg Oral q1800  . B-complex with vitamin C  1 tablet Per Tube Daily  . chlorhexidine  15 mL Mouth Rinse BID  . Chlorhexidine Gluconate Cloth  6 each Topical Q0600  . collagenase   Topical Daily  . finasteride  5 mg Oral Daily  . fluticasone  2 spray Each Nare Daily  . heparin injection  (subcutaneous)  5,000 Units Subcutaneous Q8H  . levothyroxine  25 mcg Oral QAC breakfast  . lidocaine  15 mL Mouth/Throat Q6H  . magic mouthwash  15 mL Oral QID  . mouth rinse  15 mL Mouth Rinse q12n4p  . mouth rinse  15 mL Mouth Rinse q12n4p  . megestrol  200 mg Oral QHS  . midodrine  20 mg Oral TID WC  . mirtazapine  7.5 mg Oral QHS  . multivitamin-lutein  1 capsule Per Tube Daily  . nutrition supplement (JUVEN)  1 packet Per Tube BID BM  . sodium chloride flush  10-40 mL Intracatheter Q12H   Continuous Infusions: . ampicillin-sulbactam (UNASYN) IV Stopped (01/17/18 1215)  . dexmedetomidine (PRECEDEX) IV infusion 0.8 mcg/kg/hr (01/17/18 0943)  . dextrose 50 mL/hr at 01/17/18 1042  . famotidine (PEPCID) IV Stopped (01/16/18 2157)  . norepinephrine 10 mcg/min (01/17/18 0553)  . pureflow 2,000 mL/hr at 01/17/18 0548  . vancomycin Stopped (01/17/18 1152)  . vasopressin (PITRESSIN) infusion - *FOR SHOCK* 0.03 Units/min (01/17/18 0400)   PRN Meds:.acetaminophen, benzonatate, heparin, morphine injection, senna-docusate, sodium chloride, sodium chloride flush   Data Review:   Micro Results Recent Results (from the past 240 hour(s))  Blood Culture (routine x 2)     Status: None   Collection Time: 01/03/2018 10:06 AM  Result Value Ref Range Status   Specimen Description BLOOD LEFT ARM  Final   Special Requests   Final    BOTTLES DRAWN AEROBIC AND ANAEROBIC Blood Culture results may not be optimal due to an inadequate volume of blood received in culture bottles   Culture   Final    NO GROWTH 5 DAYS Performed at [facility], [address] [place],  [place] [address]    Report Status 01/15/2018 FINAL  Final  Blood Culture (routine x 2)     Status: None   Collection Time: 01/07/2018 10:11 AM  Result Value Ref Range Status   Specimen Description BLOOD LEFT ARM  Final   Special Requests   Final    BOTTLES DRAWN AEROBIC AND ANAEROBIC Blood Culture results may not be optimal due  to an inadequate volume of blood received in culture bottles   Culture   Final    NO GROWTH 5 DAYS Performed at [facility], [address] [place], [place] [address]    Report Status 01/15/2018 FINAL  Final  MRSA PCR Screening     Status: None   Collection Time: 12/23/2017  2:30 PM  Result Value Ref Range Status   MRSA by PCR NEGATIVE NEGATIVE Final    Comment:        The GeneXpert MRSA Assay (FDA approved for NASAL specimens only), is one component of a comprehensive MRSA colonization surveillance program. It is not intended to diagnose MRSA infection nor to guide or monitor treatment for MRSA infections. Performed at [facility], [address] [place], [place] [address]   CULTURE, BLOOD (ROUTINE X 2) w Reflex to ID Panel     Status: None (Preliminary result)   Collection Time: 01/15/18  4:23 PM  Result Value Ref Range Status   Specimen Description BLOOD A-LINE DRAW  Final   Special Requests   Final    BOTTLES DRAWN AEROBIC AND ANAEROBIC Blood Culture adequate volume   Culture   Final    NO GROWTH 2 DAYS Performed at [facility], [address], [place], [place] [address]    Report Status PENDING  Incomplete  CULTURE, BLOOD (ROUTINE X 2) w Reflex to ID Panel     Status: None (Preliminary result)   Collection Time: 01/15/18  5:38 PM  Result Value Ref Range Status   Specimen Description BLOOD BLOOD LEFT WRIST  Final   Special Requests   Final    BOTTLES DRAWN AEROBIC AND ANAEROBIC Blood Culture results may not be optimal due to an inadequate volume of blood received in culture bottles   Culture   Final    NO GROWTH 2 DAYS Performed at [facility], [address] [place], [place] [address]    Report Status PENDING  Incomplete  MRSA PCR Screening     Status: None   Collection Time: 01/15/18  6:44 PM  Result Value Ref Range Status   MRSA by PCR NEGATIVE NEGATIVE Final    Comment:        The GeneXpert MRSA Assay (FDA approved  for NASAL specimens only), is one component of a comprehensive MRSA colonization surveillance program. It is not intended to diagnose MRSA infection nor to guide or monitor treatment for MRSA infections. Performed at [facility], [address] [place], [place] [address]     Radiology Reports Dg Chest 2 View  Result Date: 12/26/2017 CLINICAL DATA:  Shortness of breath and weakness for several hours EXAM: CHEST - 2 VIEW COMPARISON:  12/20/2017 FINDINGS: Dialysis catheter is again identified. Stable cardiomegaly is seen. Small pleural effusions and bilateral lower lobe infiltrate/atelectasis is noted slightly greater on the right than the left. The overall appearance is similar to that seen on the prior exam. No new bony abnormality is seen. IMPRESSION: Overall stable appearance of the chest with bilateral pleural effusions and lower lobe infiltrate/atelectasis. Electronically Signed  By: Inez Catalina M.D.   On: 12/26/2017 16:21   Dg Chest 2 View  Result Date: 12/20/2017 CLINICAL DATA:  Chest tightness with mild dyspnea. EXAM: CHEST - 2 VIEW COMPARISON:  12/01/2017 FINDINGS: Stable cardiomegaly with moderate aortic atherosclerosis. New bilateral small pleural effusions obscuring the diaphragms and spanning approximately of 1-1/2 to 2 vertebral body heights on the lateral view. New right IJ dialysis catheter tip terminates in the distal SVC. IMPRESSION: Cardiomegaly with new small bilateral pleural effusions. No overt pulmonary edema. New right IJ dialysis catheter is noted without apparent complication. Electronically Signed   By: Ashley Royalty M.D.   On: 12/20/2017 00:53   Dg Abd 1 View  Result Date: 01/16/2018 CLINICAL DATA:  NG tube placement EXAM: ABDOMEN - 1 VIEW COMPARISON:  Chest x-ray 01/13/2018 FINDINGS: Nasogastric tube tip is identified within the RIGHT LOWER lobe bronchus. Significant artifact overlies the LOWER chest. There is persistent opacity in the LEFT LOWER lobe.  There is gaseous distension of the stomach. Large and small bowel loops appear normal caliber. A RIGHT femoral line overlies L5. IMPRESSION: 1. Nasogastric tube to the RIGHT LOWER lobe bronchus. 2. Gaseous distension of the stomach. 3. Increased opacity in the LEFT LOWER lobe. 4. These results were called by telephone at the time of interpretation on 01/16/2018 at 4:38 pm to Dr. Merton Border , who verbally acknowledged these results. Electronically Signed   By: Nolon Nations M.D.   On: 01/16/2018 16:39   Mr Brain Wo Contrast  Result Date: 01/03/2018 CLINICAL DATA:  84 year old male dialysis patient with altered mental status, weakness, and slurred speech. EXAM: MRI HEAD WITHOUT CONTRAST TECHNIQUE: Multiplanar, multiecho pulse sequences of the brain and surrounding structures were obtained without intravenous contrast. COMPARISON:  None. FINDINGS: Brain: Generalized cerebral volume loss with ex vacuo appearing ventricular enlargement. No restricted diffusion to suggest acute infarction. No midline shift, mass effect, evidence of mass lesion, extra-axial collection or acute intracranial hemorrhage. Cervicomedullary junction and pituitary are within normal limits. Pearline Cables and white matter signal is within normal limits for age throughout the brain. No cortical encephalomalacia or definite chronic cerebral blood products. Signal in the deep gray matter nuclei, brainstem, and cerebellum is normal for age. Vascular: Major intracranial vascular flow voids are preserved. Skull and upper cervical spine: Negative for age visible cervical spine. Normal bone marrow signal. Sinuses/Orbits: Normal orbits soft tissues. Paranasal sinuses and mastoids are well pneumatized. Other: Visible internal auditory structures appear normal. Scalp and face soft tissues appear negative. IMPRESSION: No acute intracranial abnormality. Negative for age noncontrast MRI appearance of the brain. Electronically Signed   By: Genevie Ann M.D.   On:  12/29/2017 14:27   Dg Chest Port 1 View  Result Date: 01/13/2018 CLINICAL DATA:  Pneumonia. History of prostate cancer, hypertension and mitral valve disorder. EXAM: PORTABLE CHEST 1 VIEW COMPARISON:  Radiographs 12/17/2017 and 12/26/2017. FINDINGS: 1452 hours. Right IJ hemodialysis catheters are unchanged near the superior cavoatrial junction. The heart size and mediastinal contours are stable. There are increased pleural effusions and right-greater-than-left basilar airspace opacities. There is probable mild edema. No pneumothorax. The bones appear unchanged. IMPRESSION: Worsening pleural effusions, bibasilar pulmonary opacities and probable edema. Electronically Signed   By: Richardean Sale M.D.   On: 01/13/2018 15:15   Dg Chest Port 1 View  Result Date: 12/20/2017 CLINICAL DATA:  Syncope. EXAM: PORTABLE CHEST 1 VIEW COMPARISON:  12/26/2017. FINDINGS: Dual-lumen catheter with tip over superior vena cava. Cardiomegaly. Progressive right lower lobe infiltrate. Right pleural  effusion again noted. No pneumothorax. IMPRESSION: 1.  Dual-lumen catheter with tip over superior vena cava. 2. Progressive right lower lobe infiltrate. Right pleural effusion again noted. Electronically Signed   By: Marcello Moores  Register   On: 01/01/2018 09:04   Dg Abd Portable 1v  Result Date: 01/16/2018 CLINICAL DATA:  NG tube placement EXAM: PORTABLE ABDOMEN - 1 VIEW COMPARISON:  01/16/2018 at 1605 hours FINDINGS: Enteric tube terminates in the proximal gastric body. Gaseous distention of multiple loops of small bowel in the central abdomen, raising concern for small bowel obstruction. Right groin catheter. IMPRESSION: Enteric tube terminates in the proximal gastric body. Electronically Signed   By: Julian Hy M.D.   On: 01/16/2018 17:14     CBC Recent Labs  Lab 01/13/18 1011 01/14/18 0609 01/15/18 0932 01/16/18 0536 01/17/18 1017  WBC 16.9* 15.6* 13.7* 11.4* 14.4*  HGB 9.5* 8.8* 9.0* 9.2* 10.2*  HCT 30.0* 27.2*  28.2* 28.8* 31.9*  PLT 211 165 158 146* 135*  MCV 76.0* 76.1* 76.6* 77.7* 77.8*  MCH 24.1* 24.5* 24.4* 24.9* 24.9*  MCHC 31.7* 32.3 31.9* 32.1 32.0  RDW 37.0* 36.9* 37.5* 36.7* 37.0*  LYMPHSABS 0.7* 1.2  --  0.6*  --   MONOABS 0.8 0.9  --  0.4  --   EOSABS 0.0 0.1  --  0.0  --   BASOSABS 0.0 0.0  --  0.0  --     Chemistries  Recent Labs  Lab 01/13/18 1719  01/16/18 2125 01/16/18 2131 01/17/18 0156 01/17/18 0503 01/17/18 1017  NA  --    < > 138 137 138  137 137 136  K  --    < > 5.2* 4.2 4.0  4.0 3.9 4.3  CL  --    < > 101 102 103  102 102 102  CO2  --    < > 21* 20* 21*  23 23 22   GLUCOSE  --    < > 168* 162* 147*  150* 136* 108*  BUN  --    < > 26* 26* 24*  24* 23* 22*  CREATININE  --    < > 2.15* 2.26* 2.07*  2.04* 1.91* 1.84*  CALCIUM  --    < > 7.9* 7.8* 7.7*  7.9* 7.9* 7.8*  MG  --    < > 1.8 1.7 1.7 1.7 1.7  AST 186*  --   --   --   --   --   --   ALT 152*  --   --   --   --   --   --   ALKPHOS 102  --   --   --   --   --   --   BILITOT 3.4*  --   --   --   --   --   --    < > = values in this interval not displayed.   ------------------------------------------------------------------------------------------------------------------ estimated creatinine clearance is 29.3 mL/min (A) (by C-G formula based on SCr of 1.84 mg/dL (H)). ------------------------------------------------------------------------------------------------------------------ No results for input(s): HGBA1C in the last 72 hours. ------------------------------------------------------------------------------------------------------------------ No results for input(s): CHOL, HDL, LDLCALC, TRIG, CHOLHDL, LDLDIRECT in the last 72 hours. ------------------------------------------------------------------------------------------------------------------ Recent Labs    01/15/18 1622  TSH 8.760*  T4TOTAL 5.8    ------------------------------------------------------------------------------------------------------------------ No results for input(s): VITAMINB12, FOLATE, FERRITIN, TIBC, IRON, RETICCTPCT in the last 72 hours.  Coagulation profile Recent Labs  Lab 01/13/18 1719  INR 1.19    No results for input(s): DDIMER  in the last 72 hours.  Cardiac Enzymes Recent Labs  Lab 12/11/2017 1759  TROPONINI 0.56*   ------------------------------------------------------------------------------------------------------------------ Invalid input(s): POCBNP    Assessment & Plan   1.  Hypotension suspect due to combination of pneumonia and progressive continue 2 pressors 2.  Healthcare associated pneumonia, leukocytosis.    Continue Unasyn  3.    Acute on chronic chronic hypoxic respiratory failure on oxygen.  4.  Hypoglycemia this is related to his sepsis poor p.o. intake blood sugars now increased 5.  History of congestive heart failure with mid range ejection fraction.  Dialysis to manage fluid. 6.  End-stage renal disease.    Dialysis per nephrology 7.  History of gout 8.  Anemia of chronic disease 9.  Paroxysmal atrial fibrillation on amiodarone and aspirin 10.  Weakness physical therapy evaluation   Patient's prognosis is very poor high mortality he has failed to improve   Code Status History    Date Active Date Inactive Code Status Order ID Comments User Context   12/26/2017 2019 12/29/2017 1914 Full Code [id number]  Demetrios Loll, MD Inpatient   12/20/2017 0645 12/21/2017 1922 Full Code [id number]  Arta Silence, MD Inpatient   12/20/2017 0308 12/20/2017 0645 DNR [id number]  Amelia Jo, MD ED   12/02/2017 1128 12/17/2017 1611 DNR [id number]  Max Sane, MD Inpatient   12/01/2017 1522 12/02/2017 1128 Full Code [id number]  Demetrios Loll, MD Inpatient   10/30/2017 2256 11/02/2017 1709 Full Code [id number]  Lance Coon, MD Inpatient   07/26/2017 1728 07/28/2017 1620 Full Code [id number]  Saundra Shelling, MD Inpatient   04/08/2017 1555 04/10/2017 1526 Full Code [id number]  Dustin Flock, MD Inpatient           Consults  nephrolgy  DVT Prophylaxis  heparin  Lab Results  Component Value Date   PLT 135 (L) 01/17/2018     Time Spent in minutes  70min spent Dustin Flock M.D on 01/17/2018 at 1:25 PM  Between 7am to 6pm - Pager - [phone]  After 6pm go to [email or web address] - Proofreader  [employer]   Office  [phone]

## 2018-01-17 NOTE — Progress Notes (Signed)
Pharmacy Antibiotic Note  Andrew [name] is a 84 y.o. male admitted on 01/09/2018 with pneumonia.  Pharmacy has been consulted for vancomycin and Unasyn dosing. Patient started on CRRT on 6/7. Patient likely septic with increased vasopressor requirements.   Plan: Unasyn 3g IV Q6hr while on CRRT.    Vancomycin 1g IV Q24hr while on CRRT. Will plan to obtain trough prior to dose on 6/9.   No further adjustments warranted for CRRT.   Height: 5\' 9"  (175.3 cm) Weight: 147 lb 11.3 oz (67 kg) IBW/kg (Calculated) : 70.7  Temp (24hrs), Avg:97.3 F (36.3 C), Min:95.4 F (35.2 C), Max:99 F (37.2 C)  Recent Labs  Lab 01/13/18 1011  01/14/18 0609 01/15/18 0932 01/16/18 0536  01/17/18 0156 01/17/18 0503 01/17/18 1017 01/17/18 1427 01/17/18 1824  WBC 16.9*  --  15.6* 13.7* 11.4*  --   --   --  14.4*  --   --   CREATININE  --    < > 3.05* 4.25* 3.24*   < > 2.07*  2.04* 1.91* 1.84* 1.46* 1.42*   < > = values in this interval not displayed.    Estimated Creatinine Clearance: 38 mL/min (A) (by C-G formula based on SCr of 1.42 mg/dL (H)).    No Known Allergies  Antimicrobials this admission: Unasyn 6/3  >>  Vancomycin 6/6 >>   Dose adjustments this admission: 6/6 Unasyn transitioned to Q6hr dosing while on CRRT   Microbiology results: 6/5 BCx: no growth x 5 days  6/5 BCx: no growth x 2 days  5/31 MRSA PCR: negative 6/5 MRSA PCR: negative   Thank you for allowing pharmacy to be a part of this patient's care.  Zyionna Pesce L 01/17/2018 7:43 PM

## 2018-01-17 NOTE — Progress Notes (Signed)
   01/17/18 1300  Clinical Encounter Type  Visited With Patient and family together  Visit Type Spiritual support  Referral From Nurse  Consult/Referral To Chaplain  Spiritual Encounters  Spiritual Needs Prayer;Emotional  Stress Factors  Patient Stress Factors Exhausted  Chaplin responded to a Rec Order for PT. Chaplin went to ICU nurse station. Daughter [name]) of PT came up to [place] asking about PA and Chaplin explain process. Daughter asked what did the Chaplain's do. Chaplain explained. Daughter [name] asked for prayer for PT. Chaplain prayed with PT with Daughter [name]) and grandson Larkin Ina). Chaplin told family to call Chaplain if there is anything we can do for them.

## 2018-01-18 ENCOUNTER — Inpatient Hospital Stay: Payer: Medicare Other

## 2018-01-18 LAB — CBC
HCT: 18.6 % — ABNORMAL LOW (ref 40.0–52.0)
HEMOGLOBIN: 6 g/dL — AB (ref 13.0–18.0)
MCH: 25.1 pg — AB (ref 26.0–34.0)
MCHC: 32.1 g/dL (ref 32.0–36.0)
MCV: 78.1 fL — ABNORMAL LOW (ref 80.0–100.0)
PLATELETS: 73 10*3/uL — AB (ref 150–440)
RBC: 2.38 MIL/uL — AB (ref 4.40–5.90)
RDW: 36.3 % — ABNORMAL HIGH (ref 11.5–14.5)
WBC: 10.8 10*3/uL — AB (ref 3.8–10.6)

## 2018-01-18 LAB — RENAL FUNCTION PANEL
ALBUMIN: 1.5 g/dL — AB (ref 3.5–5.0)
ALBUMIN: 1.6 g/dL — AB (ref 3.5–5.0)
Anion gap: 16 — ABNORMAL HIGH (ref 5–15)
Anion gap: 19 — ABNORMAL HIGH (ref 5–15)
BUN: 18 mg/dL (ref 6–20)
BUN: 21 mg/dL — ABNORMAL HIGH (ref 6–20)
CALCIUM: 7.1 mg/dL — AB (ref 8.9–10.3)
CHLORIDE: 102 mmol/L (ref 101–111)
CO2: 20 mmol/L — ABNORMAL LOW (ref 22–32)
CO2: 17 mmol/L — ABNORMAL LOW (ref 22–32)
Calcium: 7.2 mg/dL — ABNORMAL LOW (ref 8.9–10.3)
Chloride: 101 mmol/L (ref 101–111)
Creatinine, Ser: 1.49 mg/dL — ABNORMAL HIGH (ref 0.61–1.24)
Creatinine, Ser: 1.6 mg/dL — ABNORMAL HIGH (ref 0.61–1.24)
GFR calc Af Amer: 49 mL/min — ABNORMAL LOW (ref >60)
GFR calc non Af Amer: 42 mL/min — ABNORMAL LOW (ref >60)
GFR, EST AFRICAN AMERICAN: 45 mL/min — AB (ref >60)
GFR, EST NON AFRICAN AMERICAN: 38 mL/min — AB (ref >60)
Glucose, Bld: 168 mg/dL — ABNORMAL HIGH (ref 65–99)
Glucose, Bld: 226 mg/dL — ABNORMAL HIGH (ref 65–99)
PHOSPHORUS: 3 mg/dL (ref 2.5–4.6)
PHOSPHORUS: 4.2 mg/dL (ref 2.5–4.6)
POTASSIUM: 3.9 mmol/L (ref 3.5–5.1)
POTASSIUM: 3.8 mmol/L (ref 3.5–5.1)
SODIUM: 137 mmol/L (ref 135–145)
Sodium: 138 mmol/L (ref 135–145)

## 2018-01-18 LAB — CBC WITH DIFFERENTIAL/PLATELET
BASOS ABS: 0 10*3/uL (ref 0–0.1)
BASOS PCT: 0 %
Band Neutrophils: 1 %
Blasts: 0 %
Eosinophils Absolute: 0 10*3/uL (ref 0–0.7)
Eosinophils Relative: 0 %
HCT: 26.9 % — ABNORMAL LOW (ref 40.0–52.0)
Hemoglobin: 8.7 g/dL — ABNORMAL LOW (ref 13.0–18.0)
LYMPHS ABS: 0.7 10*3/uL — AB (ref 1.0–3.6)
Lymphocytes Relative: 5 %
MCH: 26.6 pg (ref 26.0–34.0)
MCHC: 32.3 g/dL (ref 32.0–36.0)
MCV: 82.5 fL (ref 80.0–100.0)
METAMYELOCYTES PCT: 0 %
MONOS PCT: 3 %
Monocytes Absolute: 0.4 10*3/uL (ref 0.2–1.0)
Myelocytes: 0 %
NEUTROS ABS: 13.3 10*3/uL — AB (ref 1.4–6.5)
Neutrophils Relative %: 91 %
OTHER: 0 %
Platelets: 80 10*3/uL — ABNORMAL LOW (ref 150–440)
Promyelocytes Relative: 0 %
RBC: 3.26 MIL/uL — ABNORMAL LOW (ref 4.40–5.90)
RDW: 27.4 % — AB (ref 11.5–14.5)
WBC: 14.4 10*3/uL — ABNORMAL HIGH (ref 3.8–10.6)
nRBC: 0 /100 WBC

## 2018-01-18 LAB — BLOOD GAS, ARTERIAL
Acid-base deficit: 10.8 mmol/L — ABNORMAL HIGH (ref 0.0–2.0)
Bicarbonate: 16.4 mmol/L — ABNORMAL LOW (ref 20.0–28.0)
FIO2: 0.4
MECHVT: 450 mL
O2 SAT: 96.2 %
PCO2 ART: 40 mmHg (ref 32.0–48.0)
PEEP: 5 cmH2O
Patient temperature: 37
RATE: 15 resp/min
pH, Arterial: 7.22 — ABNORMAL LOW (ref 7.350–7.450)
pO2, Arterial: 99 mmHg (ref 83.0–108.0)

## 2018-01-18 LAB — APTT: aPTT: 45 seconds — ABNORMAL HIGH (ref 24–36)

## 2018-01-18 LAB — GLUCOSE, CAPILLARY
GLUCOSE-CAPILLARY: 111 mg/dL — AB (ref 65–99)
Glucose-Capillary: 129 mg/dL — ABNORMAL HIGH (ref 65–99)
Glucose-Capillary: 104 mg/dL — ABNORMAL HIGH (ref 65–99)
Glucose-Capillary: 86 mg/dL (ref 65–99)

## 2018-01-18 LAB — FIBRINOGEN
FIBRINOGEN: 220 mg/dL (ref 210–475)
Fibrinogen: 213 mg/dL (ref 210–475)

## 2018-01-18 LAB — MAGNESIUM
Magnesium: 1.9 mg/dL (ref 1.7–2.4)
Magnesium: 2 mg/dL (ref 1.7–2.4)

## 2018-01-18 LAB — PROTIME-INR
INR: 1.82
PROTHROMBIN TIME: 20.9 s — AB (ref 11.4–15.2)

## 2018-01-18 MED ORDER — ROCURONIUM BROMIDE 50 MG/5ML IV SOLN
50.0000 mg | Freq: Once | INTRAVENOUS | Status: AC
  Administered 2018-01-18: 50 mg via INTRAVENOUS

## 2018-01-18 MED ORDER — ETOMIDATE 2 MG/ML IV SOLN
20.0000 mg | Freq: Once | INTRAVENOUS | Status: AC
  Administered 2018-01-18: 20 mg via INTRAVENOUS

## 2018-01-18 MED ORDER — FENTANYL CITRATE (PF) 100 MCG/2ML IJ SOLN
INTRAMUSCULAR | Status: AC
  Administered 2018-01-18: 100 ug via INTRAVENOUS
  Filled 2018-01-18: qty 2

## 2018-01-18 MED ORDER — SODIUM CHLORIDE 0.9 % IV SOLN
Freq: Once | INTRAVENOUS | Status: AC
  Administered 2018-01-18: 03:00:00 via INTRAVENOUS

## 2018-01-18 MED ORDER — MIDAZOLAM HCL 2 MG/2ML IJ SOLN: 1.0000 mg | INTRAMUSCULAR | Status: DC | PRN

## 2018-01-18 MED ORDER — MIDAZOLAM HCL 2 MG/2ML IJ SOLN
INTRAMUSCULAR | Status: AC
  Filled 2018-01-18: qty 2

## 2018-01-18 MED ORDER — ETOMIDATE 2 MG/ML IV SOLN
INTRAVENOUS | Status: AC
  Administered 2018-01-18: 20 mg via INTRAVENOUS
  Filled 2018-01-18: qty 10

## 2018-01-18 MED ORDER — SODIUM BICARBONATE 8.4 % IV SOLN: 100.0000 meq | Freq: Once | INTRAVENOUS | Status: DC

## 2018-01-18 MED ORDER — SODIUM BICARBONATE 8.4 % IV SOLN
100.0000 meq | Freq: Once | INTRAVENOUS | Status: AC
  Administered 2018-01-18: 100 meq via INTRAVENOUS

## 2018-01-18 MED ORDER — FENTANYL CITRATE (PF) 100 MCG/2ML IJ SOLN: 50.0000 ug | INTRAMUSCULAR | Status: DC | PRN

## 2018-01-18 MED ORDER — FENTANYL CITRATE (PF) 100 MCG/2ML IJ SOLN
INTRAMUSCULAR | Status: AC
  Filled 2018-01-18: qty 2

## 2018-01-18 MED ORDER — PHENYLEPHRINE HCL 10 MG/ML IJ SOLN
0.0000 ug/min | INTRAMUSCULAR | Status: DC
  Administered 2018-01-18: 20 ug/min via INTRAVENOUS
  Filled 2018-01-18: qty 40
  Filled 2018-01-18 (×4): qty 4

## 2018-01-18 MED ORDER — SODIUM CHLORIDE 0.9 % IV SOLN
0.3000 ug/kg | INTRAVENOUS | Status: AC
  Administered 2018-01-18: 20 ug via INTRAVENOUS
  Filled 2018-01-18: qty 5

## 2018-01-18 MED ORDER — FENTANYL CITRATE (PF) 100 MCG/2ML IJ SOLN
100.0000 ug | Freq: Once | INTRAMUSCULAR | Status: AC
  Administered 2018-01-18: 100 ug via INTRAVENOUS

## 2018-01-18 MED ORDER — SODIUM BICARBONATE 8.4 % IV SOLN
INTRAVENOUS | Status: DC
  Filled 2018-01-18 (×2): qty 150

## 2018-01-18 MED ORDER — TECHNETIUM TC 99M-LABELED RED BLOOD CELLS IV KIT
20.0000 | PACK | Freq: Once | INTRAVENOUS | Status: AC | PRN
  Administered 2018-01-18: 21.96 via INTRAVENOUS

## 2018-01-18 MED ORDER — LORAZEPAM 2 MG/ML IJ SOLN
1.0000 mg | INTRAMUSCULAR | Status: DC | PRN
  Administered 2018-01-18: 1 mg via INTRAVENOUS
  Filled 2018-01-18: qty 1

## 2018-01-18 MED ORDER — FENTANYL CITRATE (PF) 100 MCG/2ML IJ SOLN
50.0000 ug | INTRAMUSCULAR | Status: DC | PRN
  Administered 2018-01-18: 50 ug via INTRAVENOUS
  Filled 2018-01-18: qty 2

## 2018-01-18 MED ORDER — ROCURONIUM BROMIDE 50 MG/5ML IV SOLN
INTRAVENOUS | Status: AC
  Administered 2018-01-18: 50 mg via INTRAVENOUS
  Filled 2018-01-18: qty 1

## 2018-01-18 MED ORDER — MORPHINE 100MG IN NS 100ML (1MG/ML) PREMIX INFUSION
1.0000 mg/h | INTRAVENOUS | Status: DC
  Administered 2018-01-18: 4 mg/h via INTRAVENOUS
  Filled 2018-01-18: qty 100

## 2018-01-18 MED ORDER — SODIUM CHLORIDE 0.9 % IV SOLN
Freq: Once | INTRAVENOUS | Status: AC
  Administered 2018-01-18: 04:00:00 via INTRAVENOUS

## 2018-01-19 LAB — BPAM RBC
BLOOD PRODUCT EXPIRATION DATE: 201907022359
BLOOD PRODUCT EXPIRATION DATE: 201907032359
Blood Product Expiration Date: 201906162359
Blood Product Expiration Date: 201906232359
Blood Product Expiration Date: 201907022359
ISSUE DATE / TIME: 201906080332
ISSUE DATE / TIME: 201906080450
UNIT TYPE AND RH: 9500
UNIT TYPE AND RH: 9500
Unit Type and Rh: 9500
Unit Type and Rh: 9500
Unit Type and Rh: 9500

## 2018-01-19 LAB — BPAM PLATELET PHERESIS
BLOOD PRODUCT EXPIRATION DATE: 201906092359
ISSUE DATE / TIME: 201906071403
UNIT TYPE AND RH: 5100

## 2018-01-19 LAB — BPAM CRYOPRECIPITATE
Blood Product Expiration Date: 201906081032
ISSUE DATE / TIME: 201906080840
Unit Type and Rh: 5100

## 2018-01-19 LAB — TYPE AND SCREEN
ABO/RH(D): O NEG
ANTIBODY SCREEN: NEGATIVE
UNIT DIVISION: 0
UNIT DIVISION: 0
UNIT DIVISION: 0
UNIT DIVISION: 0
Unit division: 0

## 2018-01-19 LAB — BPAM FFP
BLOOD PRODUCT EXPIRATION DATE: 201906122359
Blood Product Expiration Date: 201906122359
ISSUE DATE / TIME: 201906072135
ISSUE DATE / TIME: 201906080029
UNIT TYPE AND RH: 5100
UNIT TYPE AND RH: 5100

## 2018-01-19 LAB — PREPARE RBC (CROSSMATCH)

## 2018-01-19 LAB — PREPARE FRESH FROZEN PLASMA
UNIT DIVISION: 0
Unit division: 0

## 2018-01-19 LAB — PREPARE PLATELET PHERESIS: Unit division: 0

## 2018-01-19 LAB — PREPARE CRYOPRECIPITATE: UNIT DIVISION: 0

## 2018-01-20 LAB — CULTURE, BLOOD (ROUTINE X 2)
Culture: NO GROWTH
Culture: NO GROWTH
SPECIAL REQUESTS: ADEQUATE

## 2018-01-20 LAB — GLUCOSE, CAPILLARY: Glucose-Capillary: 15 mg/dL — CL (ref 65–99)

## 2018-01-27 ENCOUNTER — Telehealth: Payer: Self-pay | Admitting: Internal Medicine

## 2018-01-27 NOTE — Telephone Encounter (Signed)
Called and made aware death certificate ready for pick-up.

## 2018-01-27 NOTE — Telephone Encounter (Signed)
Received Death Certificate.  Placed in nurse box.

## 2018-01-27 NOTE — Telephone Encounter (Signed)
Death certificate placed in MD folder. ([place])

## 2018-02-06 ENCOUNTER — Ambulatory Visit: Payer: Medicare Other | Admitting: Family

## 2018-02-10 NOTE — Progress Notes (Signed)
Pt found in Nuclear medicine, having bleeding scan performed at this time.  Report received from [facility].  Pt appears stable on multiple pressors.  No sedation since intubation approx 3 hours ago. Pt is not moving, or responding at this time.   Will continue to monitor.

## 2018-02-10 NOTE — Progress Notes (Signed)
Transported pt to Nuclear Med on vent without incident. Report given to ICU RT.

## 2018-02-10 NOTE — Progress Notes (Signed)
Chaplain was in ICU and notice nurse preparing for extubation. Chaplain offered support with prayer and storytelling. Family is holding hope that Pt will recover. Chaplain prayed "according to God will" , This is a large family and the pat was an impactful leader of the family.     February 05, 2018 1200  Clinical Encounter Type  Visited With Patient and family together  Visit Type Spiritual support;Critical Care  Referral From Nurse  Spiritual Encounters  Spiritual Needs Prayer;Emotional

## 2018-02-10 NOTE — Progress Notes (Signed)
Pharmacy Antibiotic Note  Andrew [name] is a 84 y.o. male admitted on 12/17/2017 with pneumonia.  Pharmacy has been consulted for vancomycin and Unasyn dosing. Patient started on CRRT on 6/7. Patient likely septic with increased vasopressor requirements. Patient started on pantoprazole drip on 6/7 and intubated early am on 6/9.   Plan: Unasyn 3g IV Q6hr while on CRRT.    Vancomycin 1g IV Q24hr while on CRRT. Will plan to obtain trough prior to dose on 6/9.   No further adjustments warranted for CRRT.   Height: 5\' 9"  (175.3 cm) Weight: 147 lb 11.3 oz (67 kg) IBW/kg (Calculated) : 70.7  Temp (24hrs), Avg:97.1 F (36.2 C), Min:96.1 F (35.6 C), Max:99 F (37.2 C)  Recent Labs  Lab 01/15/18 0932 01/16/18 0536  01/17/18 1017 01/17/18 1427 01/17/18 1824 01/17/18 2007 02-17-18 0131  WBC 13.7* 11.4*  --  14.4*  --   --  13.0* 10.8*  CREATININE 4.25* 3.24*   < > 1.84* 1.46* 1.42* 1.40* 1.49*   < > = values in this interval not displayed.    Estimated Creatinine Clearance: 36.2 mL/min (A) (by C-G formula based on SCr of 1.49 mg/dL (H)).    No Known Allergies  Antimicrobials this admission: Unasyn 6/3  >>  Vancomycin 6/6 >>   Dose adjustments this admission: 6/6 Unasyn transitioned to Q6hr dosing while on CRRT   Microbiology results: 5/31 BCx: no growth x 5 days  6/5 BCx: no growth x 3 days  5/31 MRSA PCR: negative 6/5 MRSA PCR: negative   Thank you for allowing pharmacy to be a part of this patient's care.  Andrew [name] 02/17/18 9:20 AM

## 2018-02-10 NOTE — Progress Notes (Signed)
Dr. Jefferson Fuel continued discussion with family about goals of care.  Family is discussing comfort care.  They will let us know what they decide.

## 2018-02-10 NOTE — Progress Notes (Signed)
Pt declining throughout the shift at 2020 notified by nurse pt had large amount of BRB per rectum with clots Gastroenterology consulted.  I spoke with Dr. Alice Reichert via telephone we discussed pts plan of care placed orders for 2 units of FFP and serial H&H q6hrs.  At that time pt too unstable for transport to GI bleeding scan.  I spoke with pts family multiple times regarding change in pts condition.  After further discussion pts family decided to change pts code status to Limited Code Only: Mechanical Intubation ONLY.  The pt had a second episode of large amount of BRB per rectum at 0250 notified Dr. Alice Reichert he agreed with stat CBC and GI Bleeding Scan at that time pts bp stable.  CBC results revealed hgb 6.0 and platelets 73, therefore orders placed for pt to receive:1 unit of platelets and 3 units of pRBC's. I spoke with [facility] physician Dr. Freddrick March she recommended administering 1 unit of cryo and DDAVP in addition to other blood products ordered. Pt required emergent mechanical intubation @0400  am due to rapid decline in respiratory status pts daughter at bedside and updated regarding change in pts condition and plan of care, she stated she would notify her siblings.  GI Bleeding Scan pending if able to stabilize pt for transport.    Marda Stalker, [place] Pager [phone] (please enter 7 digits) PCCM Consult Pager [phone] (please enter 7 digits)

## 2018-02-10 NOTE — Procedures (Signed)
Endotracheal Intubation Procedure Note  Indication for endotracheal intubation: respiratory failure. Airway Assessment: Mallampati Class: II (hard and soft palate, upper portion of tonsils anduvula visible). Sedation: etomidate and fentanyl. Paralytic: rocuronium. Lidocaine: no. Atropine: no. Equipment: glidescope uilized . Cricoid Pressure: no. Number of attempts: 1. ETT location confirmed by by auscultation and ETCO2 monitor.  Pt emergently intubated due to severe respiratory failure   Marda Stalker, [place] Pager [phone] (please enter 7 digits) PCCM Consult Pager [phone] (please enter 7 digits)

## 2018-02-10 NOTE — Progress Notes (Signed)
Follow up - Critical Care Medicine Note  Patient Details:    Andrew [name] is an 84 y.o. male.with a past medical history remarkable for hypertension, coronary artery disease, atrial fibrillation, hyperlipidemia, prostate cancer, valvular disease, end-stage renal disease on hemodialysis, was initially admitted after having difficulties over hemodialysis. He had nausea, vomiting, belching, change in mental status with choking and possible aspiration. He was subsequently admitted to the hospital. Unfortunately today hemodialysis was stopped after 2.5 hours secondary to hypotension, hypoglycemia and altered mental status. Patient has arrived in the intensive care unit   Lines, Airways, Drains: CVC Triple Lumen 01/15/18 Right Femoral (Active)  Indication for Insertion or Continuance of Line Vasoactive infusions 01/15/2018  8:00 PM  Site Assessment Clean;Dry;Intact 01/15/2018  8:00 PM  Proximal Lumen Status Infusing 01/15/2018  8:00 PM  Medial Lumen Status Infusing 01/15/2018  8:00 PM  Distal Lumen Status Infusing 01/15/2018  8:00 PM  Dressing Type Transparent 01/15/2018  8:00 PM  Dressing Status Intact 01/15/2018  8:00 PM  Line Care Connections checked and tightened 01/15/2018  8:00 PM  Dressing Change Due 01/22/18 01/15/2018  8:00 PM    Anti-infectives:  Anti-infectives (From admission, onward)   Start     Dose/Rate Route Frequency Ordered Stop   2018/01/21 1000  vancomycin (VANCOCIN) IVPB 1000 mg/200 mL premix     1,000 mg 200 mL/hr over 60 Minutes Intravenous Every 24 hours 01/17/18 1951     01/16/18 1127  Ampicillin-Sulbactam (UNASYN) 3 g in sodium chloride 0.9 % 100 mL IVPB     3 g 200 mL/hr over 30 Minutes Intravenous Every 6 hours 01/16/18 0957     01/16/18 1030  vancomycin (VANCOCIN) IVPB 1000 mg/200 mL premix  Status:  Discontinued     1,000 mg 200 mL/hr over 60 Minutes Intravenous Every 24 hours 01/16/18 1023 01/17/18 1951   01/13/18 1700  Ampicillin-Sulbactam (UNASYN) 3 g in sodium chloride 0.9 %  100 mL IVPB  Status:  Discontinued     3 g 200 mL/hr over 30 Minutes Intravenous Every 12 hours 01/13/18 1450 01/16/18 0957   01/11/18 1000  ceFEPIme (MAXIPIME) 1 g in sodium chloride 0.9 % 100 mL IVPB  Status:  Discontinued     1 g 200 mL/hr over 30 Minutes Intravenous Every 24 hours 12/15/2017 1535 01/13/18 1443   12/14/2017 1700  vancomycin (VANCOCIN) 500 mg in sodium chloride 0.9 % 100 mL IVPB     500 mg 100 mL/hr over 60 Minutes Intravenous  Once 12/22/2017 1536 12/29/2017 1708   01/08/2018 1115  fluconazole (DIFLUCAN) tablet 100 mg  Status:  Discontinued     100 mg Oral Daily 12/26/2017 1112 01/14/18 1333   12/12/2017 1015  ceFEPIme (MAXIPIME) 2 g in sodium chloride 0.9 % 100 mL IVPB     2 g 200 mL/hr over 30 Minutes Intravenous  Once 01/09/2018 1005 12/27/2017 1127   12/29/2017 1015  vancomycin (VANCOCIN) IVPB 1000 mg/200 mL premix     1,000 mg 200 mL/hr over 60 Minutes Intravenous  Once 12/27/2017 1005 12/27/2017 1227      Microbiology: Results for orders placed or performed during the hospital encounter of 01/01/2018  Blood Culture (routine x 2)     Status: None   Collection Time: 12/20/2017 10:06 AM  Result Value Ref Range Status   Specimen Description BLOOD LEFT ARM  Final   Special Requests   Final    BOTTLES DRAWN AEROBIC AND ANAEROBIC Blood Culture results may not be optimal due to an  inadequate volume of blood received in culture bottles   Culture   Final    NO GROWTH 5 DAYS Performed at [facility], [address] [place], [place] [address]    Report Status 01/15/2018 FINAL  Final  Blood Culture (routine x 2)     Status: None   Collection Time: 01/01/2018 10:11 AM  Result Value Ref Range Status   Specimen Description BLOOD LEFT ARM  Final   Special Requests   Final    BOTTLES DRAWN AEROBIC AND ANAEROBIC Blood Culture results may not be optimal due to an inadequate volume of blood received in culture bottles   Culture   Final    NO GROWTH 5 DAYS Performed at [facility], [address] [place], [place] [address]    Report Status 01/15/2018 FINAL  Final  MRSA PCR Screening     Status: None   Collection Time: 12/23/2017  2:30 PM  Result Value Ref Range Status   MRSA by PCR NEGATIVE NEGATIVE Final    Comment:        The GeneXpert MRSA Assay (FDA approved for NASAL specimens only), is one component of a comprehensive MRSA colonization surveillance program. It is not intended to diagnose MRSA infection nor to guide or monitor treatment for MRSA infections. Performed at [facility], [address] [place], [place] [address]   CULTURE, BLOOD (ROUTINE X 2) w Reflex to ID Panel     Status: None (Preliminary result)   Collection Time: 01/15/18  4:23 PM  Result Value Ref Range Status   Specimen Description BLOOD A-LINE DRAW  Final   Special Requests   Final    BOTTLES DRAWN AEROBIC AND ANAEROBIC Blood Culture adequate volume   Culture   Final    NO GROWTH 3 DAYS Performed at [facility], [address], [place], [place] [address]    Report Status PENDING  Incomplete  CULTURE, BLOOD (ROUTINE X 2) w Reflex to ID Panel     Status: None (Preliminary result)   Collection Time: 01/15/18  5:38 PM  Result Value Ref Range Status   Specimen Description BLOOD BLOOD LEFT WRIST  Final   Special Requests   Final    BOTTLES DRAWN AEROBIC AND ANAEROBIC Blood Culture results may not be optimal due to an inadequate volume of blood received in culture bottles   Culture   Final    NO GROWTH 3 DAYS Performed at [facility], [address], [place], [place] [address]    Report Status PENDING  Incomplete  MRSA PCR Screening     Status: None   Collection Time: 01/15/18  6:44 PM  Result Value Ref Range Status   MRSA by PCR NEGATIVE NEGATIVE Final    Comment:        The GeneXpert MRSA Assay (FDA approved for NASAL specimens only), is one component of a comprehensive MRSA colonization surveillance program. It is not intended to  diagnose MRSA infection nor to guide or monitor treatment for MRSA infections. Performed at [facility], [address] [place], [place] [address]    Dg Chest 2 View  Result Date: 12/26/2017 CLINICAL DATA:  Shortness of breath and weakness for several hours EXAM: CHEST - 2 VIEW COMPARISON:  12/20/2017 FINDINGS: Dialysis catheter is again identified. Stable cardiomegaly is seen. Small pleural effusions and bilateral lower lobe infiltrate/atelectasis is noted slightly greater on the right than the left. The overall appearance is similar to that seen on  the prior exam. No new bony abnormality is seen. IMPRESSION: Overall stable appearance of the chest with bilateral pleural effusions and lower lobe infiltrate/atelectasis. Electronically Signed   By: Inez Catalina M.D.   On: 12/26/2017 16:21   Dg Chest 2 View  Result Date: 12/20/2017 CLINICAL DATA:  Chest tightness with mild dyspnea. EXAM: CHEST - 2 VIEW COMPARISON:  12/01/2017 FINDINGS: Stable cardiomegaly with moderate aortic atherosclerosis. New bilateral small pleural effusions obscuring the diaphragms and spanning approximately of 1-1/2 to 2 vertebral body heights on the lateral view. New right IJ dialysis catheter tip terminates in the distal SVC. IMPRESSION: Cardiomegaly with new small bilateral pleural effusions. No overt pulmonary edema. New right IJ dialysis catheter is noted without apparent complication. Electronically Signed   By: Ashley Royalty M.D.   On: 12/20/2017 00:53   Dg Abd 1 View  Result Date: 01/16/2018 CLINICAL DATA:  NG tube placement EXAM: ABDOMEN - 1 VIEW COMPARISON:  Chest x-ray 01/13/2018 FINDINGS: Nasogastric tube tip is identified within the RIGHT LOWER lobe bronchus. Significant artifact overlies the LOWER chest. There is persistent opacity in the LEFT LOWER lobe. There is gaseous distension of the stomach. Large and small bowel loops appear normal caliber. A RIGHT femoral line overlies L5. IMPRESSION: 1.  Nasogastric tube to the RIGHT LOWER lobe bronchus. 2. Gaseous distension of the stomach. 3. Increased opacity in the LEFT LOWER lobe. 4. These results were called by telephone at the time of interpretation on 01/16/2018 at 4:38 pm to Dr. Merton Border , who verbally acknowledged these results. Electronically Signed   By: Nolon Nations M.D.   On: 01/16/2018 16:39   Mr Brain Wo Contrast  Result Date: 01/09/2018 CLINICAL DATA:  84 year old male dialysis patient with altered mental status, weakness, and slurred speech. EXAM: MRI HEAD WITHOUT CONTRAST TECHNIQUE: Multiplanar, multiecho pulse sequences of the brain and surrounding structures were obtained without intravenous contrast. COMPARISON:  None. FINDINGS: Brain: Generalized cerebral volume loss with ex vacuo appearing ventricular enlargement. No restricted diffusion to suggest acute infarction. No midline shift, mass effect, evidence of mass lesion, extra-axial collection or acute intracranial hemorrhage. Cervicomedullary junction and pituitary are within normal limits. Pearline Cables and white matter signal is within normal limits for age throughout the brain. No cortical encephalomalacia or definite chronic cerebral blood products. Signal in the deep gray matter nuclei, brainstem, and cerebellum is normal for age. Vascular: Major intracranial vascular flow voids are preserved. Skull and upper cervical spine: Negative for age visible cervical spine. Normal bone marrow signal. Sinuses/Orbits: Normal orbits soft tissues. Paranasal sinuses and mastoids are well pneumatized. Other: Visible internal auditory structures appear normal. Scalp and face soft tissues appear negative. IMPRESSION: No acute intracranial abnormality. Negative for age noncontrast MRI appearance of the brain. Electronically Signed   By: Genevie Ann M.D.   On: 01/07/2018 14:27   Nm Gi Blood Loss  Result Date: 01-27-2018 CLINICAL DATA:  Lower GI bleeding EXAM: NUCLEAR MEDICINE GASTROINTESTINAL BLEEDING  SCAN TECHNIQUE: Sequential abdominal images were obtained following intravenous administration of Tc-48m labeled red blood cells. RADIOPHARMACEUTICALS:  21.96 mCi Tc-60m pertechnetate in-vitro labeled red cells. COMPARISON:  Radiography from 2 days ago FINDINGS: Normal blood pool.  No evidence of GI bleeding at 2 hours. IMPRESSION: 2 hours of imaging are negative for active GI bleeding. Electronically Signed   By: Monte Fantasia M.D.   On: Jan 27, 2018 08:27   Dg Chest Port 1 View  Result Date: 01/13/2018 CLINICAL DATA:  Pneumonia. History of prostate cancer, hypertension and mitral  valve disorder. EXAM: PORTABLE CHEST 1 VIEW COMPARISON:  Radiographs 12/29/2017 and 12/26/2017. FINDINGS: 1452 hours. Right IJ hemodialysis catheters are unchanged near the superior cavoatrial junction. The heart size and mediastinal contours are stable. There are increased pleural effusions and right-greater-than-left basilar airspace opacities. There is probable mild edema. No pneumothorax. The bones appear unchanged. IMPRESSION: Worsening pleural effusions, bibasilar pulmonary opacities and probable edema. Electronically Signed   By: Richardean Sale M.D.   On: 01/13/2018 15:15   Dg Chest Port 1 View  Result Date: 12/29/2017 CLINICAL DATA:  Syncope. EXAM: PORTABLE CHEST 1 VIEW COMPARISON:  12/26/2017. FINDINGS: Dual-lumen catheter with tip over superior vena cava. Cardiomegaly. Progressive right lower lobe infiltrate. Right pleural effusion again noted. No pneumothorax. IMPRESSION: 1.  Dual-lumen catheter with tip over superior vena cava. 2. Progressive right lower lobe infiltrate. Right pleural effusion again noted. Electronically Signed   By: Marcello Moores  Register   On: 12/26/2017 09:04   Dg Abd Portable 1v  Result Date: 01/16/2018 CLINICAL DATA:  NG tube placement EXAM: PORTABLE ABDOMEN - 1 VIEW COMPARISON:  01/16/2018 at 1605 hours FINDINGS: Enteric tube terminates in the proximal gastric body. Gaseous distention of multiple  loops of small bowel in the central abdomen, raising concern for small bowel obstruction. Right groin catheter. IMPRESSION: Enteric tube terminates in the proximal gastric body. Electronically Signed   By: Julian Hy M.D.   On: 01/16/2018 17:14    Consults: Treatment Team:  Anthonette Legato, MD Efrain Sella, MD   Subjective:    Overnight Issues:  Events of last night noted. Patient with acute lower GI bleed, requiring multiple transfusions, bleeding scan was negative, GI has been consulted.She required intubation. Presently he is maxed out on 3 pressors, vasopressin, norepinephrine, Neo-Synephrine, CRRT has been held, presently being transfused pending repeat CBC, platelets, PT/INR. Discussions with family last p.m. Patient was made DO NOT RESUSCITATE   Objective:  Vital signs for last 24 hours: Temp:  [96.1 F (35.6 C)-99 F (37.2 C)] 96.6 F (35.9 C) (06/08 0905) Pulse Rate:  [71-109] 99 (06/08 0905) Resp:  [11-28] 22 (06/08 0905) BP: (61-131)/(44-116) 103/62 (06/08 0905) SpO2:  [86 %-100 %] 97 % (06/08 0905) FiO2 (%):  [40 %] 40 % (06/08 0833)  Hemodynamic parameters for last 24 hours:    Intake/Output from previous day: 06/07 0701 - 06/08 0700 In: 4713.4 [I.V.:2495.4; NFAOZ:3086; NG/GT:30; IV Piggyback:1050] Out: 147   Intake/Output this shift: Total I/O In: 300 [Blood:300] Out: -   Vent settings for last 24 hours: Vent Mode: PRVC FiO2 (%):  [40 %] 40 % Set Rate:  [15 bmp] 15 bmp Vt Set:  [450 mL] 450 mL PEEP:  [5 cmH20] 5 cmH20 Plateau Pressure:  [8 cmH20] 8 cmH20  Physical Exam:  Patient is intubated on mechanical ventilation, on maximal pressor support to include vasopressin, Neo-Synephrine and norepinephrine Vital signs:       Please see the above listed vital signs HEENT:           oral endotracheal tube in place, trachea is midline Cardiovascular:           Tachycardia Pulmonary:     coarse rhonchi and rales appreciated bilaterally Abdominal:       soft exam this morning, hypoactive bowel sounds Extremity:        No clubbing cyanosis or edema noted Neurologic:      unresponsive Cutaneous:      No rashes or lesions noted    Assessment/Plan:   Hemodynamic collapse.  Multifactorial etiology to include overwhelming sepsis, lower gastrointestinal bleed. Patient is presently on maximal doses of vasopressin, norepinephrine and Neo-Synephrine. Has received multiple transfusions overnight, please see nursing notes. Pending repeat CBC, platelet, PT/INR, fibrinogen. Continues on vancomycin and Unasyn for presumptive pneumonia and sepsis. Bleeding scan was negative, patient is not a surgical candidate secondary to instability, Limited options at this time pending GI input. Discussion with family about goals of care, discussed with them transitioning to comfort care, there discussing amongst themselves and will let me know  End-stage renal failure.CRRT has been held secondary to hemodynamic instability. As been started on a bicarbonate infusion   Critical care time 45 minutes   Martavion Couper 08-Feb-2018  *Care during the described time interval was provided by me and/or other providers on the critical care team.  I have reviewed this patient's available data, including medical history, events of note, physical examination and test results as part of my evaluation.

## 2018-02-10 NOTE — Progress Notes (Signed)
[place] at [facility]                                                                                                                                                                                  Patient Demographics   Andrew [name], is a 84 y.o. male, DOB - April 18, 1933, [id number]  Admit date - 12/16/2017   Admitting Physician Loletha Grayer, MD  Outpatient Primary MD for the patient is [name], MD   LOS - 5  Subjective:  Intubated after having GI bleed last night. On 3 pressors.   Review of Systems:   CONSTITUTIONAL: on vent support.  Vitals:   Vitals:   02/16/2018 0925 02/16/18 0930 02/16/18 0935 02-16-2018 1132  BP: 101/62 107/60 103/63   Pulse: 98 (!) 101 (!) 102   Resp: 20 (!) 22 (!) 23   Temp: (!) 96.6 F (35.9 C) (!) 96.8 F (36 C) (!) 96.8 F (36 C)   TempSrc:      SpO2: 100% 100% 97% 100%  Weight:      Height:        Wt Readings from Last 3 Encounters:  01/17/18 67 kg (147 lb 11.3 oz)  12/26/17 73.5 kg (162 lb)  12/21/17 72.4 kg (159 lb 11.2 oz)     Intake/Output Summary (Last 24 hours) at 02-16-18 1328 Last data filed at 02/16/2018 9485 Gross per 24 hour  Intake 4613.42 ml  Output 147 ml  Net 4466.42 ml    Physical Exam:   GENERAL: Critically ill-appearing HEAD, EYES, EARS, NOSE AND THROAT: Atraumatic, normocephalic.  Pupils equal and reactive to light. Sclerae anicteric. No conjunctival injection. No oro-pharyngeal erythema.  NECK: Supple. There is no jugular venous distention. No bruits, no lymphadenopathy, no thyromegaly.  HEART: Regular rate and rhythm,. No murmurs, no rubs, no clicks.  LUNGS: Rhonchus breath sounds bilaterally ETT and vent support. ABDOMEN: Soft, flat, nontender, nondistended. Has good bowel sounds. No hepatosplenomegaly appreciated.  EXTREMITIES: No evidence of any cyanosis, clubbing, or 2+ peripheral edema.  +2 pedal and radial pulses bilaterally.  NEUROLOGIC: sedated. sKIN: Moist and  warm with no rashes appreciated.  Psych: sedated. LN: No inguinal LN enlargement    Antibiotics   Anti-infectives (From admission, onward)   Start     Dose/Rate Route Frequency Ordered Stop   February 16, 2018 1000  vancomycin (VANCOCIN) IVPB 1000 mg/200 mL premix  Status:  Discontinued     1,000 mg 200 mL/hr over 60 Minutes Intravenous Every 24 hours 01/17/18 1951 2018/02/16 1258   01/16/18 1127  Ampicillin-Sulbactam (UNASYN) 3 g in sodium chloride 0.9 % 100 mL IVPB  Status:  Discontinued     3  g 200 mL/hr over 30 Minutes Intravenous Every 6 hours 01/16/18 0957 02-14-2018 1258   01/16/18 1030  vancomycin (VANCOCIN) IVPB 1000 mg/200 mL premix  Status:  Discontinued     1,000 mg 200 mL/hr over 60 Minutes Intravenous Every 24 hours 01/16/18 1023 01/17/18 1951   01/13/18 1700  Ampicillin-Sulbactam (UNASYN) 3 g in sodium chloride 0.9 % 100 mL IVPB  Status:  Discontinued     3 g 200 mL/hr over 30 Minutes Intravenous Every 12 hours 01/13/18 1450 01/16/18 0957   01/11/18 1000  ceFEPIme (MAXIPIME) 1 g in sodium chloride 0.9 % 100 mL IVPB  Status:  Discontinued     1 g 200 mL/hr over 30 Minutes Intravenous Every 24 hours 12/29/2017 1535 01/13/18 1443   12/20/2017 1700  vancomycin (VANCOCIN) 500 mg in sodium chloride 0.9 % 100 mL IVPB     500 mg 100 mL/hr over 60 Minutes Intravenous  Once 12/22/2017 1536 01/08/2018 1708   12/17/2017 1115  fluconazole (DIFLUCAN) tablet 100 mg  Status:  Discontinued     100 mg Oral Daily 12/31/2017 1112 01/14/18 1333   12/13/2017 1015  ceFEPIme (MAXIPIME) 2 g in sodium chloride 0.9 % 100 mL IVPB     2 g 200 mL/hr over 30 Minutes Intravenous  Once 01/02/2018 1005 01/06/2018 1127   12/27/2017 1015  vancomycin (VANCOCIN) IVPB 1000 mg/200 mL premix     1,000 mg 200 mL/hr over 60 Minutes Intravenous  Once 12/27/2017 1005 12/30/2017 1227      Medications   Scheduled Meds: . mouth rinse  15 mL Mouth Rinse q12n4p   Continuous Infusions: . morphine 6 mg/hr (02/14/18 1248)   PRN Meds:.fentaNYL  (SUBLIMAZE) injection, fentaNYL (SUBLIMAZE) injection, LORazepam, midazolam, midazolam, morphine injection   Data Review:   Micro Results Recent Results (from the past 240 hour(s))  Blood Culture (routine x 2)     Status: None   Collection Time: 12/26/2017 10:06 AM  Result Value Ref Range Status   Specimen Description BLOOD LEFT ARM  Final   Special Requests   Final    BOTTLES DRAWN AEROBIC AND ANAEROBIC Blood Culture results may not be optimal due to an inadequate volume of blood received in culture bottles   Culture   Final    NO GROWTH 5 DAYS Performed at [facility], [address] [place], [place] [address]    Report Status 01/15/2018 FINAL  Final  Blood Culture (routine x 2)     Status: None   Collection Time: 01/09/2018 10:11 AM  Result Value Ref Range Status   Specimen Description BLOOD LEFT ARM  Final   Special Requests   Final    BOTTLES DRAWN AEROBIC AND ANAEROBIC Blood Culture results may not be optimal due to an inadequate volume of blood received in culture bottles   Culture   Final    NO GROWTH 5 DAYS Performed at [facility], [address] [place], [place] [address]    Report Status 01/15/2018 FINAL  Final  MRSA PCR Screening     Status: None   Collection Time: 12/20/2017  2:30 PM  Result Value Ref Range Status   MRSA by PCR NEGATIVE NEGATIVE Final    Comment:        The GeneXpert MRSA Assay (FDA approved for NASAL specimens only), is one component of a comprehensive MRSA colonization surveillance program. It is not intended to diagnose MRSA infection nor to guide or monitor treatment for MRSA infections. Performed at [facility], [place], [place] [address]   CULTURE, BLOOD (ROUTINE X 2) w Reflex to ID Panel     Status: None (Preliminary result)   Collection Time: 01/15/18  4:23 PM  Result Value Ref Range Status   Specimen Description BLOOD A-LINE DRAW  Final   Special Requests   Final    BOTTLES DRAWN  AEROBIC AND ANAEROBIC Blood Culture adequate volume   Culture   Final    NO GROWTH 3 DAYS Performed at [facility], [address] [place], [place] [address]    Report Status PENDING  Incomplete  CULTURE, BLOOD (ROUTINE X 2) w Reflex to ID Panel     Status: None (Preliminary result)   Collection Time: 01/15/18  5:38 PM  Result Value Ref Range Status   Specimen Description BLOOD BLOOD LEFT WRIST  Final   Special Requests   Final    BOTTLES DRAWN AEROBIC AND ANAEROBIC Blood Culture results may not be optimal due to an inadequate volume of blood received in culture bottles   Culture   Final    NO GROWTH 3 DAYS Performed at [facility], [address] [place], [place] [address]    Report Status PENDING  Incomplete  MRSA PCR Screening     Status: None   Collection Time: 01/15/18  6:44 PM  Result Value Ref Range Status   MRSA by PCR NEGATIVE NEGATIVE Final    Comment:        The GeneXpert MRSA Assay (FDA approved for NASAL specimens only), is one component of a comprehensive MRSA colonization surveillance program. It is not intended to diagnose MRSA infection nor to guide or monitor treatment for MRSA infections. Performed at [facility], [address] [place], [place] [address]     Radiology Reports Dg Chest 2 View  Result Date: 12/26/2017 CLINICAL DATA:  Shortness of breath and weakness for several hours EXAM: CHEST - 2 VIEW COMPARISON:  12/20/2017 FINDINGS: Dialysis catheter is again identified. Stable cardiomegaly is seen. Small pleural effusions and bilateral lower lobe infiltrate/atelectasis is noted slightly greater on the right than the left. The overall appearance is similar to that seen on the prior exam. No new bony abnormality is seen. IMPRESSION: Overall stable appearance of the chest with bilateral pleural effusions and lower lobe infiltrate/atelectasis. Electronically Signed   By: Inez Catalina M.D.   On: 12/26/2017 16:21   Dg Chest  2 View  Result Date: 12/20/2017 CLINICAL DATA:  Chest tightness with mild dyspnea. EXAM: CHEST - 2 VIEW COMPARISON:  12/01/2017 FINDINGS: Stable cardiomegaly with moderate aortic atherosclerosis. New bilateral small pleural effusions obscuring the diaphragms and spanning approximately of 1-1/2 to 2 vertebral body heights on the lateral view. New right IJ dialysis catheter tip terminates in the distal SVC. IMPRESSION: Cardiomegaly with new small bilateral pleural effusions. No overt pulmonary edema. New right IJ dialysis catheter is noted without apparent complication. Electronically Signed   By: Ashley Royalty M.D.   On: 12/20/2017 00:53   Dg Abd 1 View  Result Date: 01/16/2018 CLINICAL DATA:  NG tube placement EXAM: ABDOMEN - 1 VIEW COMPARISON:  Chest x-ray 01/13/2018 FINDINGS: Nasogastric tube tip is identified within the RIGHT LOWER lobe bronchus. Significant artifact overlies the LOWER chest. There is persistent opacity in the LEFT LOWER lobe. There is gaseous distension of the stomach. Large and small bowel loops appear normal caliber. A RIGHT femoral line overlies L5. IMPRESSION: 1. Nasogastric tube to the RIGHT LOWER lobe bronchus.  2. Gaseous distension of the stomach. 3. Increased opacity in the LEFT LOWER lobe. 4. These results were called by telephone at the time of interpretation on 01/16/2018 at 4:38 pm to Dr. Merton Border , who verbally acknowledged these results. Electronically Signed   By: Nolon Nations M.D.   On: 01/16/2018 16:39   Mr Brain Wo Contrast  Result Date: 12/22/2017 CLINICAL DATA:  84 year old male dialysis patient with altered mental status, weakness, and slurred speech. EXAM: MRI HEAD WITHOUT CONTRAST TECHNIQUE: Multiplanar, multiecho pulse sequences of the brain and surrounding structures were obtained without intravenous contrast. COMPARISON:  None. FINDINGS: Brain: Generalized cerebral volume loss with ex vacuo appearing ventricular enlargement. No restricted diffusion to  suggest acute infarction. No midline shift, mass effect, evidence of mass lesion, extra-axial collection or acute intracranial hemorrhage. Cervicomedullary junction and pituitary are within normal limits. Pearline Cables and white matter signal is within normal limits for age throughout the brain. No cortical encephalomalacia or definite chronic cerebral blood products. Signal in the deep gray matter nuclei, brainstem, and cerebellum is normal for age. Vascular: Major intracranial vascular flow voids are preserved. Skull and upper cervical spine: Negative for age visible cervical spine. Normal bone marrow signal. Sinuses/Orbits: Normal orbits soft tissues. Paranasal sinuses and mastoids are well pneumatized. Other: Visible internal auditory structures appear normal. Scalp and face soft tissues appear negative. IMPRESSION: No acute intracranial abnormality. Negative for age noncontrast MRI appearance of the brain. Electronically Signed   By: Genevie Ann M.D.   On: 01/08/2018 14:27   Nm Gi Blood Loss  Result Date: 2018/01/19 CLINICAL DATA:  Lower GI bleeding EXAM: NUCLEAR MEDICINE GASTROINTESTINAL BLEEDING SCAN TECHNIQUE: Sequential abdominal images were obtained following intravenous administration of Tc-26m labeled red blood cells. RADIOPHARMACEUTICALS:  21.96 mCi Tc-74m pertechnetate in-vitro labeled red cells. COMPARISON:  Radiography from 2 days ago FINDINGS: Normal blood pool.  No evidence of GI bleeding at 2 hours. IMPRESSION: 2 hours of imaging are negative for active GI bleeding. Electronically Signed   By: Monte Fantasia M.D.   On: January 19, 2018 08:27   Dg Chest Port 1 View  Result Date: 01/13/2018 CLINICAL DATA:  Pneumonia. History of prostate cancer, hypertension and mitral valve disorder. EXAM: PORTABLE CHEST 1 VIEW COMPARISON:  Radiographs 12/19/2017 and 12/26/2017. FINDINGS: 1452 hours. Right IJ hemodialysis catheters are unchanged near the superior cavoatrial junction. The heart size and mediastinal contours are  stable. There are increased pleural effusions and right-greater-than-left basilar airspace opacities. There is probable mild edema. No pneumothorax. The bones appear unchanged. IMPRESSION: Worsening pleural effusions, bibasilar pulmonary opacities and probable edema. Electronically Signed   By: Richardean Sale M.D.   On: 01/13/2018 15:15   Dg Chest Port 1 View  Result Date: 01/08/2018 CLINICAL DATA:  Syncope. EXAM: PORTABLE CHEST 1 VIEW COMPARISON:  12/26/2017. FINDINGS: Dual-lumen catheter with tip over superior vena cava. Cardiomegaly. Progressive right lower lobe infiltrate. Right pleural effusion again noted. No pneumothorax. IMPRESSION: 1.  Dual-lumen catheter with tip over superior vena cava. 2. Progressive right lower lobe infiltrate. Right pleural effusion again noted. Electronically Signed   By: Marcello Moores  Register   On: 01/04/2018 09:04   Dg Abd Portable 1v  Result Date: 01/16/2018 CLINICAL DATA:  NG tube placement EXAM: PORTABLE ABDOMEN - 1 VIEW COMPARISON:  01/16/2018 at 1605 hours FINDINGS: Enteric tube terminates in the proximal gastric body. Gaseous distention of multiple loops of small bowel in the central abdomen, raising concern for small bowel obstruction. Right groin catheter. IMPRESSION: Enteric tube terminates in the  proximal gastric body. Electronically Signed   By: Julian Hy M.D.   On: 01/16/2018 17:14     CBC Recent Labs  Lab 01/13/18 1011 01/14/18 0609  01/16/18 0536 01/17/18 1017 01/17/18 2007 02/09/18 0131 2018-02-09 0902  WBC 16.9* 15.6*   < > 11.4* 14.4* 13.0* 10.8* 14.4*  HGB 9.5* 8.8*   < > 9.2* 10.2* 8.3* 6.0* 8.7*  HCT 30.0* 27.2*   < > 28.8* 31.9* 26.5* 18.6* 26.9*  PLT 211 165   < > 146* 135* 99* 73* 80*  MCV 76.0* 76.1*   < > 77.7* 77.8* 79.7* 78.1* 82.5  MCH 24.1* 24.5*   < > 24.9* 24.9* 25.1* 25.1* 26.6  MCHC 31.7* 32.3   < > 32.1 32.0 31.5* 32.1 32.3  RDW 37.0* 36.9*   < > 36.7* 37.0* 37.0* 36.3* 27.4*  LYMPHSABS 0.7* 1.2  --  0.6*  --  1.8  --   0.7*  MONOABS 0.8 0.9  --  0.4  --  0.6  --  0.4  EOSABS 0.0 0.1  --  0.0  --  0.0  --  0.0  BASOSABS 0.0 0.0  --  0.0  --  0.0  --  0.0   < > = values in this interval not displayed.    Chemistries  Recent Labs  Lab 01/13/18 1719  01/17/18 1427 01/17/18 1824 01/17/18 2007 09-Feb-2018 0131 02/09/18 0902 2018-02-09 0903  NA  --    < > 136 135 137 138  --  137  K  --    < > 4.0 4.0 4.6 3.9  --  3.8  CL  --    < > 103 102 103 102  --  101  CO2  --    < > 24 23 22  20*  --  17*  GLUCOSE  --    < > 131* 135* 121* 168*  --  226*  BUN  --    < > 21* 19 19 18   --  21*  CREATININE  --    < > 1.46* 1.42* 1.40* 1.49*  --  1.60*  CALCIUM  --    < > 7.6* 7.7* 7.6* 7.2*  --  7.1*  MG  --    < > 1.7 2.2 2.2 1.9 2.0  --   AST 186*  --   --   --   --   --   --   --   ALT 152*  --   --   --   --   --   --   --   ALKPHOS 102  --   --   --   --   --   --   --   BILITOT 3.4*  --   --   --   --   --   --   --    < > = values in this interval not displayed.   ------------------------------------------------------------------------------------------------------------------ estimated creatinine clearance is 33.7 mL/min (A) (by C-G formula based on SCr of 1.6 mg/dL (H)). ------------------------------------------------------------------------------------------------------------------ No results for input(s): HGBA1C in the last 72 hours. ------------------------------------------------------------------------------------------------------------------ No results for input(s): CHOL, HDL, LDLCALC, TRIG, CHOLHDL, LDLDIRECT in the last 72 hours. ------------------------------------------------------------------------------------------------------------------ Recent Labs    01/15/18 1622  TSH 8.760*  T4TOTAL 5.8   ------------------------------------------------------------------------------------------------------------------ No results for input(s): VITAMINB12, FOLATE, FERRITIN, TIBC, IRON, RETICCTPCT in  the last 72 hours.  Coagulation profile Recent Labs  Lab 01/13/18 1719 01/17/18 2007 2018-02-09 0902  INR  1.19 1.62 1.82    No results for input(s): DDIMER in the last 72 hours.  Cardiac Enzymes No results for input(s): CKMB, TROPONINI, MYOGLOBIN in the last 168 hours.  Invalid input(s): CK ------------------------------------------------------------------------------------------------------------------ Invalid input(s): [place]   1.  Hypotension - due to sepsis and pneumonia and progressive continue 2 pressors 2.  Healthcare associated pneumonia- sepsis, leukocytosis.    Continue Unasyn  3.  Acute on chronic chronic hypoxic respiratory failure on oxygen. now intubated. 4.  Hypoglycemia this is related to his sepsis poor p.o. intake blood sugars now increased 5.  History of congestive heart failure with mid range ejection fraction.  Dialysis to manage fluid. 6.  End-stage renal disease. Dialysis per nephrology 7.  History of gout 8.  Anemia of chronic disease 9.  Paroxysmal atrial fibrillation on amiodarone and aspirin 10.  Weakness physical therapy evaluation  11. GI bleed- intubated, bleedign scan negative for active bleed. Received transfusions, Plt overnight.      GI had discussed with family about poor prognosis.  Patient's prognosis is very poor high mortality he has failed to improve After discussion today with intensivist- family agreed on terminal extubation and palliative care.  Code Status History    Date Active Date Inactive Code Status Order ID Comments User Context   12/26/2017 2019 12/29/2017 1914 Full Code [id number]  Demetrios Loll, MD Inpatient   12/20/2017 0645 12/21/2017 1922 Full Code [id number]  Arta Silence, MD Inpatient   12/20/2017 0308 12/20/2017 0645 DNR [id number]  Amelia Jo, MD ED   12/02/2017 1128 12/17/2017 1611 DNR [id number]  Max Sane, MD Inpatient   12/01/2017 1522 12/02/2017 1128 Full Code [id number]  Demetrios Loll, MD  Inpatient   10/30/2017 2256 11/02/2017 1709 Full Code [id number]  Lance Coon, MD Inpatient   07/26/2017 1728 07/28/2017 1620 Full Code [id number]  Saundra Shelling, MD Inpatient   04/08/2017 1555 04/10/2017 1526 Full Code [id number]  Dustin Flock, MD Inpatient        Consults  nephrolgy  DVT Prophylaxis  heparin  Lab Results  Component Value Date   PLT 80 (L) 01-21-18     Time Spent in minutes  6min spent Vaughan Basta M.D on 2018-01-21 at 1:28 PM  Between 7am to 6pm - Pager - [phone]  After 6pm go to [email or web address] - Proofreader  [employer]   Office  [phone]

## 2018-02-10 NOTE — Progress Notes (Signed)
Nuclear Med Technician on call, Andrew [name], contacted for ordered bleeding scan.  ETA 1 hour.

## 2018-02-10 NOTE — Death Summary Note (Signed)
DEATH SUMMARY   Patient Details  Name: JOESEPH [name] MRN: [id number] DOB: Nov 26, 1932  Admission/Discharge Information   Admit Date:  February 08, 2018  Date of Death: Date of Death: 16-Feb-2018  Time of Death: Time of Death: 1302-12-08  Length of Stay: 5  Referring Physician: [name], MD   Reason(s) for Hospitalization  [name]  is a 84 y.o. male was over at dialysis and ended up getting dizzy.  The patient states that his speech has not been clear for 4 days.  He has had some intermittent nausea and vomiting and some belching.  At dialysis today he was choking and then had decreased responsiveness.  He was given oxygen and and came back.  Patient feels weak.  In the ER, the ER physician was concerned about pneumonia and started aggressive antibiotics.  The patient was recently discharged from the hospital and was treated on that stay for pneumonia in the same spot with Levaquin p.o. patient does not complain of chest pain or shortness of breath forward no cough. Upon admission to the intensive care unit patient was started on pressors after central line was placed. He eventually was started on CRRT and remained encephalopathic. He was felt to be in septic shock most likely secondary to pneumonia.unfortunately on February 17, 2023 the patient's status deteriorated. He developed a large amount of bright blood per rectum. He was transfused after hemoglobin of 6 noted, was also given DDAVP and fresh frozen plasma. Patient was also emergently intubated secondary to progressive respiratory failure.family discussion and goals of care outlined. Patient with multisystem organ failure, refractory sepsis,persisting gastrointestinal bleeding, respiratory failure and end-stage renal disease. Patient wishes to proceed with palliative/comfort care, extubation. Patient subsequently deceased 02-16-2018, 1304, pronounced by nurse.    Diagnoses  Preliminary cause of death: Septic shock/gastrointestinal bleeding Secondary Diagnoses  (including complications and co-morbidities):  Active Problems:   Pneumonia   Protein-calorie malnutrition, severe   Physical deconditioning   PNA (pneumonia)   HCAP (healthcare-associated pneumonia)   Failure to thrive in adult   Palliative care by specialist   Goals of care, counseling/discussion   Brief Hospital Course (including significant findings, care, treatment, and services provided and events leading to death)   Andrew [name] is an 84 year old gentleman with a past medical history remarkable for hypertension, coronary artery disease, atrial fibrillation, hyperlipidemia, prostate cancer, valvular disease, end-stage renal disease on hemodialysis, was initially admitted after having difficulties over hemodialysis. He had nausea, vomiting, belching, change in mental status with choking and possible aspiration. He was subsequently admitted to the hospital. Unfortunately today hemodialysis was stopped after 2.5 hours secondary to hypotension, hypoglycemia and altered mental status. Patient has arrived in the intensive care unit, is encephalopathic, thus far blood pressures have remained stable, has been repeatedly hypoglycemic, given an amp of D50 and started on a D10 drip.     Pertinent Labs and Studies  Significant Diagnostic Studies Dg Chest 2 View  Result Date: 12/26/2017 CLINICAL DATA:  Shortness of breath and weakness for several hours EXAM: CHEST - 2 VIEW COMPARISON:  12/20/2017 FINDINGS: Dialysis catheter is again identified. Stable cardiomegaly is seen. Small pleural effusions and bilateral lower lobe infiltrate/atelectasis is noted slightly greater on the right than the left. The overall appearance is similar to that seen on the prior exam. No new bony abnormality is seen. IMPRESSION: Overall stable appearance of the chest with bilateral pleural effusions and lower lobe infiltrate/atelectasis. Electronically Signed   By: Inez Catalina M.D.   On: 12/26/2017 16:21  Dg Abd 1  View  Result Date: 01/16/2018 CLINICAL DATA:  NG tube placement EXAM: ABDOMEN - 1 VIEW COMPARISON:  Chest x-ray 01/13/2018 FINDINGS: Nasogastric tube tip is identified within the RIGHT LOWER lobe bronchus. Significant artifact overlies the LOWER chest. There is persistent opacity in the LEFT LOWER lobe. There is gaseous distension of the stomach. Large and small bowel loops appear normal caliber. A RIGHT femoral line overlies L5. IMPRESSION: 1. Nasogastric tube to the RIGHT LOWER lobe bronchus. 2. Gaseous distension of the stomach. 3. Increased opacity in the LEFT LOWER lobe. 4. These results were called by telephone at the time of interpretation on 01/16/2018 at 4:38 pm to Dr. Merton Border , who verbally acknowledged these results. Electronically Signed   By: Nolon Nations M.D.   On: 01/16/2018 16:39   Mr Brain Wo Contrast  Result Date: 12/29/2017 CLINICAL DATA:  84 year old male dialysis patient with altered mental status, weakness, and slurred speech. EXAM: MRI HEAD WITHOUT CONTRAST TECHNIQUE: Multiplanar, multiecho pulse sequences of the brain and surrounding structures were obtained without intravenous contrast. COMPARISON:  None. FINDINGS: Brain: Generalized cerebral volume loss with ex vacuo appearing ventricular enlargement. No restricted diffusion to suggest acute infarction. No midline shift, mass effect, evidence of mass lesion, extra-axial collection or acute intracranial hemorrhage. Cervicomedullary junction and pituitary are within normal limits. Pearline Cables and white matter signal is within normal limits for age throughout the brain. No cortical encephalomalacia or definite chronic cerebral blood products. Signal in the deep gray matter nuclei, brainstem, and cerebellum is normal for age. Vascular: Major intracranial vascular flow voids are preserved. Skull and upper cervical spine: Negative for age visible cervical spine. Normal bone marrow signal. Sinuses/Orbits: Normal orbits soft tissues.  Paranasal sinuses and mastoids are well pneumatized. Other: Visible internal auditory structures appear normal. Scalp and face soft tissues appear negative. IMPRESSION: No acute intracranial abnormality. Negative for age noncontrast MRI appearance of the brain. Electronically Signed   By: Genevie Ann M.D.   On: 12/13/2017 14:27   Nm Gi Blood Loss  Result Date: 2018-01-21 CLINICAL DATA:  Lower GI bleeding EXAM: NUCLEAR MEDICINE GASTROINTESTINAL BLEEDING SCAN TECHNIQUE: Sequential abdominal images were obtained following intravenous administration of Tc-65m labeled red blood cells. RADIOPHARMACEUTICALS:  21.96 mCi Tc-67m pertechnetate in-vitro labeled red cells. COMPARISON:  Radiography from 2 days ago FINDINGS: Normal blood pool.  No evidence of GI bleeding at 2 hours. IMPRESSION: 2 hours of imaging are negative for active GI bleeding. Electronically Signed   By: Monte Fantasia M.D.   On: 01/21/2018 08:27   Dg Chest Port 1 View  Result Date: 01/13/2018 CLINICAL DATA:  Pneumonia. History of prostate cancer, hypertension and mitral valve disorder. EXAM: PORTABLE CHEST 1 VIEW COMPARISON:  Radiographs 12/22/2017 and 12/26/2017. FINDINGS: 1452 hours. Right IJ hemodialysis catheters are unchanged near the superior cavoatrial junction. The heart size and mediastinal contours are stable. There are increased pleural effusions and right-greater-than-left basilar airspace opacities. There is probable mild edema. No pneumothorax. The bones appear unchanged. IMPRESSION: Worsening pleural effusions, bibasilar pulmonary opacities and probable edema. Electronically Signed   By: Richardean Sale M.D.   On: 01/13/2018 15:15   Dg Chest Port 1 View  Result Date: 12/13/2017 CLINICAL DATA:  Syncope. EXAM: PORTABLE CHEST 1 VIEW COMPARISON:  12/26/2017. FINDINGS: Dual-lumen catheter with tip over superior vena cava. Cardiomegaly. Progressive right lower lobe infiltrate. Right pleural effusion again noted. No pneumothorax. IMPRESSION:  1.  Dual-lumen catheter with tip over superior vena cava. 2. Progressive right lower lobe  infiltrate. Right pleural effusion again noted. Electronically Signed   By: Marcello Moores  Register   On: 01/05/2018 09:04   Dg Abd Portable 1v  Result Date: 01/16/2018 CLINICAL DATA:  NG tube placement EXAM: PORTABLE ABDOMEN - 1 VIEW COMPARISON:  01/16/2018 at 1605 hours FINDINGS: Enteric tube terminates in the proximal gastric body. Gaseous distention of multiple loops of small bowel in the central abdomen, raising concern for small bowel obstruction. Right groin catheter. IMPRESSION: Enteric tube terminates in the proximal gastric body. Electronically Signed   By: Julian Hy M.D.   On: 01/16/2018 17:14    Microbiology Recent Results (from the past 240 hour(s))  CULTURE, BLOOD (ROUTINE X 2) w Reflex to ID Panel     Status: None   Collection Time: 01/15/18  4:23 PM  Result Value Ref Range Status   Specimen Description BLOOD A-LINE DRAW  Final   Special Requests   Final    BOTTLES DRAWN AEROBIC AND ANAEROBIC Blood Culture adequate volume   Culture   Final    NO GROWTH 5 DAYS Performed at [facility], [address] [place], [place] [address]    Report Status 01/20/2018 FINAL  Final  CULTURE, BLOOD (ROUTINE X 2) w Reflex to ID Panel     Status: None   Collection Time: 01/15/18  5:38 PM  Result Value Ref Range Status   Specimen Description BLOOD BLOOD LEFT WRIST  Final   Special Requests   Final    BOTTLES DRAWN AEROBIC AND ANAEROBIC Blood Culture results may not be optimal due to an inadequate volume of blood received in culture bottles   Culture   Final    NO GROWTH 5 DAYS Performed at [facility], [address] [place], [place] [address]    Report Status 01/20/2018 FINAL  Final  MRSA PCR Screening     Status: None   Collection Time: 01/15/18  6:44 PM  Result Value Ref Range Status   MRSA by PCR NEGATIVE NEGATIVE Final    Comment:        The GeneXpert MRSA Assay  (FDA approved for NASAL specimens only), is one component of a comprehensive MRSA colonization surveillance program. It is not intended to diagnose MRSA infection nor to guide or monitor treatment for MRSA infections. Performed at [facility], [address] [place], [place] [address]     Lab Basic Metabolic Panel: Recent Labs  Lab 01/17/18 1427 01/17/18 1824 01/17/18 2007 2018/01/31 0131 01-31-2018 0902 01-31-18 0903  NA 136 135 137 138  --  137  K 4.0 4.0 4.6 3.9  --  3.8  CL 103 102 103 102  --  101  CO2 24 23 22  20*  --  17*  GLUCOSE 131* 135* 121* 168*  --  226*  BUN 21* 19 19 18   --  21*  CREATININE 1.46* 1.42* 1.40* 1.49*  --  1.60*  CALCIUM 7.6* 7.7* 7.6* 7.2*  --  7.1*  MG 1.7 2.2 2.2 1.9 2.0  --   PHOS 2.7 2.7 2.8 3.0  --  4.2   Liver Function Tests: Recent Labs  Lab 01/17/18 1427 01/17/18 1824 01/17/18 2007 2018/01/31 0131 31-Jan-2018 0903  ALBUMIN 1.7* 1.6* 1.4* 1.5* 1.6*   No results for input(s): LIPASE, AMYLASE in the last 168 hours. No results for input(s): AMMONIA in the last 168 hours. CBC: Recent Labs  Lab 01/17/18 1017 01/17/18 2007 2018/01/31 0131 January 31, 2018 0902  WBC 14.4* 13.0* 10.8* 14.4*  NEUTROABS  --  10.7*  --  13.3*  HGB 10.2* 8.3* 6.0* 8.7*  HCT 31.9* 26.5* 18.6* 26.9*  MCV 77.8* 79.7* 78.1* 82.5  PLT 135* 99* 73* 80*   Cardiac Enzymes: No results for input(s): CKTOTAL, CKMB, CKMBINDEX, TROPONINI in the last 168 hours. Sepsis Labs: Recent Labs  Lab 01/17/18 1017 01/17/18 2007 02/05/18 0131 February 05, 2018 0902  WBC 14.4* 13.0* 10.8* 14.4*    Procedures/Operations  CRRT Central line placement Nuclear medicine bleeding scan   Serafina Topham 01/24/2018, 8:35 AM

## 2018-02-10 NOTE — Progress Notes (Signed)
Chaplain was rounding and was referred to Pt. Chaplain let family know that he would be available when need. There was no current needs.   02-07-18 1000  Clinical Encounter Type  Visited With Patient and family together  Visit Type Spiritual support  Referral From Nurse  Spiritual Encounters  Spiritual Needs Emotional

## 2018-02-10 NOTE — Procedures (Signed)
Extubation Procedure Note  Patient Details:   Name: Andrew [name] DOB: [birth year] MRN: [id number]   Airway Documentation:    Vent end date: February 03, 2018 Vent end time: 1240   Evaluation  O2 sats: stable throughout Complications: No apparent complications Patient did tolerate procedure well. Bilateral Breath Sounds: Clear, Diminished   No   Extubated to room air for comfort care.  Rayne Du Alvan Culpepper 02-03-2018, 12:42 PM

## 2018-02-10 NOTE — Consult Note (Signed)
[facility] GI Inpatient Consult Note   Kathline Magic, M.D.  Reason for Consult: Massive lower gastrointestinal bleeding, septic shock   Attending Requesting Consult: Hermelinda Dellen, D.O.  Outpatient Primary Physician: [name], M.D.  History of Present Illness: Andrew [name] is a 84 y.o. malewith a history of end-stage renal disea osteoarthritis,coronary artery disease, atrial fibrillation and hypertension who had an episode of altered mental status and presumed aspiration of secretions resulting in a severe case of pneumonia. Patient suffered from hypotensive episode and was rapidly transferred to the intensive care unit where he has been placed on 3 pressor agents. Yesterday after given subcutaneous heparin as always, the patient had significant passage of red clots per rectum. This continued to occur throughout the evening. I spoke with the nurse practitioner, Hinton Dyer RN, on the phone last evening and we ordered a bleeding scan which was unfortunately negative. The patient continued to bleed at that significant rate and continued to be hypotensive even on pressors. GI was called to give an opinion based upon continuing lower GI bleeding. The patient continued to have problemswith ventilator dependency and inability to wean due to hemodynamic instability. Spoken today with the patient's daughter as well as a nephew who along with another relative have decided to extubate the patient and withdrawal artificial support given the poor prognosis.   Past Medical History:  Past Medical History:  Diagnosis Date  . Arrhythmia    atrial fibrillation  . Arteriosclerosis of coronary artery 01/27/2014  . Benign essential HTN 12/11/2013  . Carpal tunnel syndrome   . Chronic kidney disease   . Coronary atherosclerosis of native coronary artery   . Disorder of mitral valve 12/11/2013  . Encounter for long-term (current) use of antiplatelets/antithrombotics   . Gout   . Hyperlipidemia   .  Hypertension   . Lung nodule    found on xray in Aug 2018  . Mitral valve disorder   . Osteoarthritis   . Prostate cancer [facility])     Problem List: Patient Active Problem List   Diagnosis Date Noted  . HCAP (healthcare-associated pneumonia)   . Failure to thrive in adult   . Palliative care by specialist   . Goals of care, counseling/discussion   . PNA (pneumonia) 01/13/2018  . Pneumonia 12/31/2017  . Protein-calorie malnutrition, severe 12/18/2017  . Physical deconditioning   . CHF (congestive heart failure) ([place]) 12/27/2017  . Fluid overload 12/26/2017  . NSTEMI (non-ST elevated myocardial infarction) ([place]) 12/20/2017  . ESRD (end stage renal disease) ([place]) 12/19/2017  . Atrial fibrillation ([place]) 12/19/2017  . Pressure injury of skin 12/02/2017  . Renal failure (ARF), acute on chronic (HCC) 12/01/2017  . Unstable angina ([place]) 10/31/2017  . Elevated troponin 10/30/2017  . Chronic systolic CHF (congestive heart failure) ([place]) 10/30/2017  . Acute posthemorrhagic anemia   . GERD (gastroesophageal reflux disease)   . PAD (peripheral artery disease) ([place]) 06/04/2016  . Colon polyp 06/29/2015  . Malignant neoplasm of prostate ([place]) 11/14/2014  . HLD (hyperlipidemia) 07/29/2014  . Long term current use of antithrombotics/antiplatelets 01/27/2014  . Arteriosclerosis of coronary artery 01/27/2014  . Carpal tunnel syndrome 12/11/2013  . Benign essential HTN 12/11/2013  . Lesion of ulnar nerve 12/11/2013  . Disorder of mitral valve 12/11/2013  . Arthritis, degenerative 12/11/2013  . Hereditary and idiopathic neuropathy 12/11/2013    Past Surgical History: Past Surgical History:  Procedure Laterality Date  . COLONOSCOPY    . COLONOSCOPY WITH PROPOFOL N/A 07/23/2016   Procedure:  COLONOSCOPY WITH PROPOFOL;  Surgeon: Manya Silvas, MD;  Location: [facility] ENDOSCOPY;  Service: Endoscopy;  Laterality: N/A;  . CORONARY ANGIOPLASTY WITH STENT PLACEMENT    . DIALYSIS/PERMA CATHETER  INSERTION N/A 12/09/2017   Procedure: DIALYSIS/PERMA CATHETER INSERTION;  Surgeon: Algernon Huxley, MD;  Location: [facility] CV LAB;  Service: Cardiovascular;  Laterality: N/A;  . DIALYSIS/PERMA CATHETER INSERTION N/A 12/16/2017   Procedure: DIALYSIS/PERMA CATHETER INSERTION;  Surgeon: Algernon Huxley, MD;  Location: [facility] CV LAB;  Service: Cardiovascular;  Laterality: N/A;  . ESOPHAGOGASTRODUODENOSCOPY Left 07/28/2017   Procedure: ESOPHAGOGASTRODUODENOSCOPY (EGD);  Surgeon: Virgel Manifold, MD;  Location[facility] ENDOSCOPY;  Service: Endoscopy;  Laterality: Left;  . ESOPHAGOGASTRODUODENOSCOPY (EGD) WITH PROPOFOL N/A 07/23/2016   Procedure: ESOPHAGOGASTRODUODENOSCOPY (EGD) WITH PROPOFOL;  Surgeon: Manya Silvas, MD;  Location: [facility] ENDOSCOPY;  Service: Endoscopy;  Laterality: N/A;  . PROSTATE BIOPSY      Allergies: No Known Allergies  Home Medications: Medications Prior to Admission  Medication Sig Dispense Refill Last Dose  . amiodarone (PACERONE) 400 MG tablet Take 1 tablet (400 mg total) by mouth daily. 30 tablet 0 01/09/2018 at Unknown time  . aspirin EC 81 MG tablet Take 1 tablet (81 mg total) by mouth daily. 30 tablet 2 01/09/2018 at Unknown time  . atorvastatin (LIPITOR) 40 MG tablet Take 40 mg by mouth daily.  6 01/09/2018 at Unknown time  . Diphenhyd-Hydrocort-Nystatin (FIRST-DUKES MOUTHWASH MT) Take 5 mLs by mouth 4 (four) times daily.  0 01/09/2018 at Unknown time  . finasteride (PROSCAR) 5 MG tablet Take 5 mg by mouth daily.  11 01/09/2018 at Unknown time  . megestrol (MEGACE) 40 MG/ML suspension Take 5 mLs by mouth at bedtime.  11 01/09/2018 at Unknown time  . midodrine (PROAMATINE) 10 MG tablet Take 1 tablet (10 mg total) by mouth 3 (three) times daily with meals. 90 tablet 0 12/21/2017 at Unknown time  . multivitamin (RENA-VIT) TABS tablet Take 1 tablet by mouth at bedtime. 30 tablet 0 01/09/2018 at Unknown time  . multivitamin-lutein (OCUVITE-LUTEIN) CAPS capsule Take 1 capsule  by mouth daily. 30 capsule 0 01/09/2018 at Unknown time  . omeprazole (PRILOSEC) 20 MG capsule Take 1 capsule (20 mg total) by mouth 2 (two) times daily. 60 capsule 1 01/09/2018 at Unknown time  . acetaminophen (TYLENOL) 325 MG tablet Take 2 tablets (650 mg total) by mouth every 6 (six) hours as needed for mild pain (or Fever >/= 101).   PRN at PRN  . benzonatate (TESSALON) 200 MG capsule Take 1 capsule (200 mg total) by mouth 3 (three) times daily as needed for cough. 20 capsule 0 PRN at PRN  . fluticasone (FLONASE) 50 MCG/ACT nasal spray Place 2 sprays into both nostrils daily. 1 g 0 PRN at PRN  . guaiFENesin (MUCINEX) 600 MG 12 hr tablet Take 1 tablet (600 mg total) by mouth 2 (two) times daily as needed. 30 tablet 0 PRN at PRN  . HYDROcodone-acetaminophen (NORCO/VICODIN) 5-325 MG tablet Take 1-2 tablets by mouth every 4 (four) hours as needed for moderate pain. (Patient taking differently: Take 1 tablet by mouth every 4 (four) hours as needed for moderate pain. ) 20 tablet 0 PRN at PRN  . [EXPIRED] Nutritional Supplements (FEEDING SUPPLEMENT, NEPRO CARB STEADY,) LIQD Take 237 mLs by mouth 3 (three) times daily between meals. 21330 mL 0 12/19/2017 at Unknown time  . senna-docusate (SENOKOT-S) 8.6-50 MG tablet Take 1 tablet by mouth at bedtime as needed for mild constipation.  PRN at PRN  . sodium chloride (OCEAN) 0.65 % SOLN nasal spray Place 1 spray into both nostrils as needed for congestion. 1 Bottle 0 PRN at PRN   Home medication reconciliation was completed with the patient.   Scheduled Inpatient Medications:   . amiodarone  200 mg Oral Daily  . aspirin EC  81 mg Oral Daily  . atorvastatin  40 mg Oral q1800  . B-complex with vitamin C  1 tablet Per Tube Daily  . chlorhexidine  15 mL Mouth Rinse BID  . Chlorhexidine Gluconate Cloth  6 each Topical Q0600  . collagenase   Topical Daily  . finasteride  5 mg Oral Daily  . fluticasone  2 spray Each Nare Daily  . levothyroxine  25 mcg Oral QAC  breakfast  . lidocaine  15 mL Mouth/Throat Q6H  . magic mouthwash  15 mL Oral QID  . mouth rinse  15 mL Mouth Rinse q12n4p  . mouth rinse  15 mL Mouth Rinse q12n4p  . megestrol  200 mg Oral QHS  . midodrine  20 mg Oral TID WC  . mirtazapine  7.5 mg Oral QHS  . multivitamin-lutein  1 capsule Per Tube Daily  . nutrition supplement (JUVEN)  1 packet Per Tube BID BM  . [START ON 01/21/2018] pantoprazole  40 mg Intravenous Q12H  . sodium bicarbonate  100 mEq Intravenous Once  . sodium chloride flush  10-40 mL Intracatheter Q12H    Continuous Inpatient Infusions:   . ampicillin-sulbactam (UNASYN) IV Stopped (2018-02-04 0101)  . dexmedetomidine (PRECEDEX) IV infusion Stopped (2018/02/04 0353)  . dextrose 30 mL/hr at 01/17/18 2156  . norepinephrine 40 mcg/min (2018/02/04 0305)  . pantoprozole (PROTONIX) infusion 8 mg/hr (01/17/18 2242)  . phenylephrine (NEO-SYNEPHRINE) Adult infusion 400 mcg/min (04-Feb-2018 0356)  . pureflow 2,000 mL/hr at 01/17/18 1345  .  sodium bicarbonate  infusion 1000 mL    . vancomycin    . vasopressin (PITRESSIN) infusion - *FOR SHOCK* 0.04 Units/min (02/04/2018 0316)    PRN Inpatient Medications:  acetaminophen, benzonatate, fentaNYL (SUBLIMAZE) injection, fentaNYL (SUBLIMAZE) injection, heparin, midazolam, midazolam, morphine injection, senna-docusate, sodium chloride, sodium chloride flush  Family History: family history includes Cancer in his mother and sister.   GI Family History: Negative.  Social History:   reports that he has never smoked. He has never used smokeless tobacco. He reports that he does not drink alcohol or use drugs. The patient denies ETOH, tobacco, or drug use.    Review of Systems: Review of Systems - unobtainable  Physical Examination: BP 103/63   Pulse (!) 102   Temp (!) 96.8 F (36 C)   Resp (!) 23   Ht 5\' 9"  (1.753 m)   Wt 67 kg (147 lb 11.3 oz)   SpO2 97%   BMI 21.81 kg/m  Physical Exam  HENT:  Head: Normocephalic and  atraumatic.  Intubated, on ventilator  Eyes: Pupils are equal, round, and reactive to light. Lids are normal.  Neck: Trachea normal and full passive range of motion without pain. Decreased carotid pulses present. Carotid bruit is not present. No thyromegaly present.  Pulmonary/Chest:  Intubated, with bronchophony.  Abdominal: Soft. Normal appearance and bowel sounds are normal. He exhibits no shifting dullness and no abdominal bruit. There is no tenderness.  Lymphadenopathy:    He has no cervical adenopathy.    He has no axillary adenopathy.  Neurological: He is unresponsive. He displays normal reflexes. GCS eye subscore is 1. GCS verbal subscore is 1. GCS  motor subscore is 1.  Patient unresponsive, flaccid.  Skin: He is not diaphoretic.    Data: Lab Results  Component Value Date   WBC 10.8 (H) 17-Feb-2018   HGB 6.0 (L) February 17, 2018   HCT 18.6 (L) February 17, 2018   MCV 78.1 (L) 17-Feb-2018   PLT 73 (L) 2018-02-17   Recent Labs  Lab 01/17/18 1017 01/17/18 2007 Feb 17, 2018 0131  HGB 10.2* 8.3* 6.0*   Lab Results  Component Value Date   NA 137 02-17-2018   K 3.8 02/17/18   CL 101 02-17-2018   CO2 17 (L) 02-17-2018   BUN 21 (H) 2018-02-17   CREATININE 1.60 (H) 02/17/2018   Lab Results  Component Value Date   ALT 152 (H) 01/13/2018   AST 186 (H) 01/13/2018   ALKPHOS 102 01/13/2018   BILITOT 3.4 (H) 01/13/2018   Recent Labs  Lab 2018/02/17 0131 17-Feb-2018 0902  APTT 45*  --   INR  --  1.82   CBC Latest Ref Rng & Units February 17, 2018 01/17/2018 01/17/2018  WBC 3.8 - 10.6 K/uL 10.8(H) 13.0(H) 14.4(H)  Hemoglobin 13.0 - 18.0 g/dL 6.0(L) 8.3(L) 10.2(L)  Hematocrit 40.0 - 52.0 % 18.6(L) 26.5(L) 31.9(L)  Platelets 150 - 440 K/uL 73(L) 99(L) 135(L)    STUDIES: Dg Abd 1 View  Result Date: 01/16/2018 CLINICAL DATA:  NG tube placement EXAM: ABDOMEN - 1 VIEW COMPARISON:  Chest x-ray 01/13/2018 FINDINGS: Nasogastric tube tip is identified within the RIGHT LOWER lobe bronchus. Significant  artifact overlies the LOWER chest. There is persistent opacity in the LEFT LOWER lobe. There is gaseous distension of the stomach. Large and small bowel loops appear normal caliber. A RIGHT femoral line overlies L5. IMPRESSION: 1. Nasogastric tube to the RIGHT LOWER lobe bronchus. 2. Gaseous distension of the stomach. 3. Increased opacity in the LEFT LOWER lobe. 4. These results were called by telephone at the time of interpretation on 01/16/2018 at 4:38 pm to Dr. Merton Border , who verbally acknowledged these results. Electronically Signed   By: Nolon Nations M.D.   On: 01/16/2018 16:39   Nm Gi Blood Loss  Result Date: 02/17/18 CLINICAL DATA:  Lower GI bleeding EXAM: NUCLEAR MEDICINE GASTROINTESTINAL BLEEDING SCAN TECHNIQUE: Sequential abdominal images were obtained following intravenous administration of Tc-85m labeled red blood cells. RADIOPHARMACEUTICALS:  21.96 mCi Tc-76m pertechnetate in-vitro labeled red cells. COMPARISON:  Radiography from 2 days ago FINDINGS: Normal blood pool.  No evidence of GI bleeding at 2 hours. IMPRESSION: 2 hours of imaging are negative for active GI bleeding. Electronically Signed   By: Monte Fantasia M.D.   On: 02-17-18 08:27   Dg Abd Portable 1v  Result Date: 01/16/2018 CLINICAL DATA:  NG tube placement EXAM: PORTABLE ABDOMEN - 1 VIEW COMPARISON:  01/16/2018 at 1605 hours FINDINGS: Enteric tube terminates in the proximal gastric body. Gaseous distention of multiple loops of small bowel in the central abdomen, raising concern for small bowel obstruction. Right groin catheter. IMPRESSION: Enteric tube terminates in the proximal gastric body. Electronically Signed   By: Julian Hy M.D.   On: 01/16/2018 17:14   @IMAGES @  Assessment: 1. Septic shock - on IV anbx, IV pressors.  2. End Stage Renal Disease -on Hemodialysis, latter stopped due to hypotension. 3. Lower GI bleed, continuous - Likely ischemic colitis vs. Diverticular. Concern for former given hx of  vasculopathy. Tagged rbc scan negative. 4. DNR status  Recommendations: 1. I had a long discussion with the family including one of the patient's daughters and patient's nephew,  who is a Marine scientist at [employer]. I informed them of my opinion that in the setting of a negative bleeding scan and hypotension and ongoing bleeding, I doubted the ability to locate the site of bleeding and stop it. Unfortunately, I also shared that even if bleeding is stopped, other medical issues/comorbid health problems would not be reversed. Patient's family has decided this AM to extubate the patient for comfort care only.  At this time, I will follow only peripherally.  Thank you for the consult. Please call with questions or concerns.  Olean Ree, "Lanny Hurst MD [facility] [place], [place] [address] [phone]  Feb 02, 2018 10:55 AM

## 2018-02-10 NOTE — Progress Notes (Addendum)
Pt's family members at bedside, visibly upset, tearful, and angry at times.   Chaplin at bedside to speak with family.

## 2018-02-10 NOTE — Progress Notes (Signed)
Pt with Asystole on monitor.  Zero Resp at this time.  No audible heartbeat.  Verified by this RN and Durene Fruits RN.  MD Conforti aware.

## 2018-02-10 NOTE — Progress Notes (Signed)
[facility]  ROUNDING NOTE   Subjective:   CVVHD 4K bath Norepinephrine, phenylephrine and vasopressin  Intubated last night  Bleeding per rectum - hemoglobin droppsed to 6 - PRBC 2 units, FFP 2 units, cryoglobulin 1 unit transfused overnight.   Bleeding scan this morning  Partial code status obtained last night  Objective:  Vital signs in last 24 hours:  Temp:  [96.1 F (35.6 C)-99 F (37.2 C)] 96.4 F (35.8 C) (06/08 0830) Pulse Rate:  [71-109] 100 (06/08 0830) Resp:  [11-28] 20 (06/08 0830) BP: (61-131)/(44-116) 104/65 (06/08 0830) SpO2:  [86 %-100 %] 100 % (06/08 0833) FiO2 (%):  [40 %] 40 % (06/08 0833)  Weight change:  Filed Weights   01/13/18 1251 01/16/18 1706 01/17/18 0500  Weight: 70.8 kg (156 lb 1.4 oz) 66 kg (145 lb 8.1 oz) 67 kg (147 lb 11.3 oz)    Intake/Output: I/O last 3 completed shifts: In: 5083.4 [I.V.:2865.4; Blood:1138; NG/GT:30; IV Piggyback:1050] Out: 436 [Emesis/NG output:50; Other:386]   Intake/Output this shift:  Total I/O In: 300 [Blood:300] Out: -   Physical Exam: General: Critically ill  Head: ETT OGT  Eyes: Anicteric, PERRL  Neck:  trachea midline  Lungs:  Crackles bilaterally, PRBC Fio2 40%  Heart: regular  Abdomen:  Hypoactive bowel sounds  Extremities: ++ peripheral edema.   Neurologic: Sedated, intubated  Skin: Warm, dry, no rash  Access:  Right internal jugular permcath    Basic Metabolic Panel: Recent Labs  Lab 01/17/18 1017 01/17/18 1427 01/17/18 1824 01/17/18 2007 2018-01-31 0131  NA 136 136 135 137 138  K 4.3 4.0 4.0 4.6 3.9  CL 102 103 102 103 102  CO2 22 24 23 22  20*  GLUCOSE 108* 131* 135* 121* 168*  BUN 22* 21* 19 19 18   CREATININE 1.84* 1.46* 1.42* 1.40* 1.49*  CALCIUM 7.8* 7.6* 7.7* 7.6* 7.2*  MG 1.7 1.7 2.2 2.2 1.9  PHOS 3.1 2.7 2.7 2.8 3.0    Liver Function Tests: Recent Labs  Lab 01/13/18 1719  01/17/18 1017 01/17/18 1427 01/17/18 1824 01/17/18 2007 2018-01-31 0131  AST  186*  --   --   --   --   --   --   ALT 152*  --   --   --   --   --   --   ALKPHOS 102  --   --   --   --   --   --   BILITOT 3.4*  --   --   --   --   --   --   PROT 4.7*  --   --   --   --   --   --   ALBUMIN 2.2*   < > 1.9* 1.7* 1.6* 1.4* 1.5*   < > = values in this interval not displayed.   No results for input(s): LIPASE, AMYLASE in the last 168 hours. No results for input(s): AMMONIA in the last 168 hours.  CBC: Recent Labs  Lab 01/13/18 1011 01/14/18 0609 01/15/18 0932 01/16/18 0536 01/17/18 1017 01/17/18 2007 2018-01-31 0131  WBC 16.9* 15.6* 13.7* 11.4* 14.4* 13.0* 10.8*  NEUTROABS 15.4* 13.4*  --  10.4*  --  10.7*  --   HGB 9.5* 8.8* 9.0* 9.2* 10.2* 8.3* 6.0*  HCT 30.0* 27.2* 28.2* 28.8* 31.9* 26.5* 18.6*  MCV 76.0* 76.1* 76.6* 77.7* 77.8* 79.7* 78.1*  PLT 211 165 158 146* 135* 99* 73*    Cardiac Enzymes: No results for input(s): CKTOTAL,  CKMB, CKMBINDEX, TROPONINI in the last 168 hours.  BNP: Invalid input(s): POCBNP  CBG: Recent Labs  Lab 01/17/18 2027 01/17/18 2202 01/17/18 2344 01-30-2018 0200 30-Jan-2018 0416  GLUCAP 50* 121* 131* 129* 104*    Microbiology: Results for orders placed or performed during the hospital encounter of 01/09/2018  Blood Culture (routine x 2)     Status: None   Collection Time: 12/14/2017 10:06 AM  Result Value Ref Range Status   Specimen Description BLOOD LEFT ARM  Final   Special Requests   Final    BOTTLES DRAWN AEROBIC AND ANAEROBIC Blood Culture results may not be optimal due to an inadequate volume of blood received in culture bottles   Culture   Final    NO GROWTH 5 DAYS Performed at [facility], [address] [place], [place] [address]    Report Status 01/15/2018 FINAL  Final  Blood Culture (routine x 2)     Status: None   Collection Time: 01/05/2018 10:11 AM  Result Value Ref Range Status   Specimen Description BLOOD LEFT ARM  Final   Special Requests   Final    BOTTLES DRAWN AEROBIC AND ANAEROBIC Blood  Culture results may not be optimal due to an inadequate volume of blood received in culture bottles   Culture   Final    NO GROWTH 5 DAYS Performed at [facility], [address] [place], [place] [address]    Report Status 01/15/2018 FINAL  Final  MRSA PCR Screening     Status: None   Collection Time: 01/02/2018  2:30 PM  Result Value Ref Range Status   MRSA by PCR NEGATIVE NEGATIVE Final    Comment:        The GeneXpert MRSA Assay (FDA approved for NASAL specimens only), is one component of a comprehensive MRSA colonization surveillance program. It is not intended to diagnose MRSA infection nor to guide or monitor treatment for MRSA infections. Performed at [facility], [address] [place], [place] [address]   CULTURE, BLOOD (ROUTINE X 2) w Reflex to ID Panel     Status: None (Preliminary result)   Collection Time: 01/15/18  4:23 PM  Result Value Ref Range Status   Specimen Description BLOOD A-LINE DRAW  Final   Special Requests   Final    BOTTLES DRAWN AEROBIC AND ANAEROBIC Blood Culture adequate volume   Culture   Final    NO GROWTH 3 DAYS Performed at [facility], [address], [place], [place] [address]    Report Status PENDING  Incomplete  CULTURE, BLOOD (ROUTINE X 2) w Reflex to ID Panel     Status: None (Preliminary result)   Collection Time: 01/15/18  5:38 PM  Result Value Ref Range Status   Specimen Description BLOOD BLOOD LEFT WRIST  Final   Special Requests   Final    BOTTLES DRAWN AEROBIC AND ANAEROBIC Blood Culture results may not be optimal due to an inadequate volume of blood received in culture bottles   Culture   Final    NO GROWTH 3 DAYS Performed at [facility], [address] [place], [place] [address]    Report Status PENDING  Incomplete  MRSA PCR Screening     Status: None   Collection Time: 01/15/18  6:44 PM  Result Value Ref Range Status   MRSA by PCR NEGATIVE NEGATIVE Final    Comment:         The GeneXpert MRSA Assay (FDA  approved for NASAL specimens only), is one component of a comprehensive MRSA colonization surveillance program. It is not intended to diagnose MRSA infection nor to guide or monitor treatment for MRSA infections. Performed at [facility], [address] [place], [place] [address]     Coagulation Studies: Recent Labs    01/17/18 12-03-2005  LABPROT 19.1*  INR 1.62    Urinalysis: No results for input(s): COLORURINE, LABSPEC, PHURINE, GLUCOSEU, HGBUR, BILIRUBINUR, KETONESUR, PROTEINUR, UROBILINOGEN, NITRITE, LEUKOCYTESUR in the last 72 hours.  Invalid input(s): APPERANCEUR    Imaging: Dg Abd 1 View  Result Date: 01/16/2018 CLINICAL DATA:  NG tube placement EXAM: ABDOMEN - 1 VIEW COMPARISON:  Chest x-ray 01/13/2018 FINDINGS: Nasogastric tube tip is identified within the RIGHT LOWER lobe bronchus. Significant artifact overlies the LOWER chest. There is persistent opacity in the LEFT LOWER lobe. There is gaseous distension of the stomach. Large and small bowel loops appear normal caliber. A RIGHT femoral line overlies L5. IMPRESSION: 1. Nasogastric tube to the RIGHT LOWER lobe bronchus. 2. Gaseous distension of the stomach. 3. Increased opacity in the LEFT LOWER lobe. 4. These results were called by telephone at the time of interpretation on 01/16/2018 at 4:38 pm to Dr. Merton Border , who verbally acknowledged these results. Electronically Signed   By: Nolon Nations M.D.   On: 01/16/2018 16:39   Nm Gi Blood Loss  Result Date: 02/12/2018 CLINICAL DATA:  Lower GI bleeding EXAM: NUCLEAR MEDICINE GASTROINTESTINAL BLEEDING SCAN TECHNIQUE: Sequential abdominal images were obtained following intravenous administration of Tc-87m labeled red blood cells. RADIOPHARMACEUTICALS:  21.96 mCi Tc-49m pertechnetate in-vitro labeled red cells. COMPARISON:  Radiography from 2 days ago FINDINGS: Normal blood pool.  No evidence of GI bleeding at 2 hours. IMPRESSION: 2  hours of imaging are negative for active GI bleeding. Electronically Signed   By: Monte Fantasia M.D.   On: Feb 12, 2018 08:27   Dg Abd Portable 1v  Result Date: 01/16/2018 CLINICAL DATA:  NG tube placement EXAM: PORTABLE ABDOMEN - 1 VIEW COMPARISON:  01/16/2018 at 1605 hours FINDINGS: Enteric tube terminates in the proximal gastric body. Gaseous distention of multiple loops of small bowel in the central abdomen, raising concern for small bowel obstruction. Right groin catheter. IMPRESSION: Enteric tube terminates in the proximal gastric body. Electronically Signed   By: Julian Hy M.D.   On: 01/16/2018 17:14     Medications:   . ampicillin-sulbactam (UNASYN) IV Stopped (02-12-2018 0101)  . dexmedetomidine (PRECEDEX) IV infusion Stopped (12-Feb-2018 0353)  . dextrose 30 mL/hr at 01/17/18 12-04-54  . norepinephrine 40 mcg/min (2018/02/12 0305)  . pantoprozole (PROTONIX) infusion 8 mg/hr (01/17/18 2242)  . phenylephrine (NEO-SYNEPHRINE) Adult infusion 400 mcg/min (02/12/18 0356)  . pureflow 2,000 mL/hr at 01/17/18 1345  .  sodium bicarbonate  infusion 1000 mL    . vancomycin    . vasopressin (PITRESSIN) infusion - *FOR SHOCK* 0.04 Units/min (02/12/18 0316)   . amiodarone  200 mg Oral Daily  . aspirin EC  81 mg Oral Daily  . atorvastatin  40 mg Oral q1800  . B-complex with vitamin C  1 tablet Per Tube Daily  . chlorhexidine  15 mL Mouth Rinse BID  . Chlorhexidine Gluconate Cloth  6 each Topical Q0600  . collagenase   Topical Daily  . fentaNYL      . finasteride  5 mg Oral Daily  . fluticasone  2 spray Each Nare Daily  . levothyroxine  25 mcg Oral QAC breakfast  . lidocaine  15 mL Mouth/Throat Q6H  . magic mouthwash  15 mL Oral QID  . mouth rinse  15 mL Mouth Rinse q12n4p  . mouth rinse  15 mL Mouth Rinse q12n4p  . megestrol  200 mg Oral QHS  . midazolam      . midodrine  20 mg Oral TID WC  . mirtazapine  7.5 mg Oral QHS  . multivitamin-lutein  1 capsule Per Tube Daily  . nutrition  supplement (JUVEN)  1 packet Per Tube BID BM  . [START ON 01/21/2018] pantoprazole  40 mg Intravenous Q12H  . sodium bicarbonate  100 mEq Intravenous Once  . sodium chloride flush  10-40 mL Intracatheter Q12H   acetaminophen, benzonatate, fentaNYL (SUBLIMAZE) injection, fentaNYL (SUBLIMAZE) injection, heparin, midazolam, midazolam, morphine injection, senna-docusate, sodium chloride, sodium chloride flush  Assessment/ Plan:  [name] is a 84 y.o. black male with hypertension, hyperlipidemia, gout, ? history of prostate cancer, coronary artery disease, who was admitted to [facility] 12/30/2017 for hypoxia, AMS, pneumonia.   [facility] MWF RIJ permcath  1. End Stage Renal disease: acute renal failure with limited recovery.  Hemodynamically unstable requiring three vasopressors.  - CVVHD 4K bath. UF off  - discontinue sodium bicarb gtt  2.  Right lower lobe pneumonia. Concern for progression to septic shock. Requiring vasopressors.  - Norepinephrine, vasopressin and norepinephrine - Empiric Unasyn and vanco  3. Hypotension: with acute exacerbation of systolic and diastolic congestive heart failure. With anasarca on examination.  Septic shock and cardiogenic shock Requiring vasopressors.   4.  Anemia of chronic kidney disease: with acute blood loss anemia. Actively bleeding. Bleeding scan this morning Status post 2 units PRBC, 2 units FFP and 1 unit cryoglobulin overnight.  5. Acute respiratory failure: intubated and sedated.    Prognosis is very poor at this point. Have discussed prognosis in detail with Daughter yesterday.    LOS: 5 Charli Liberatore 2019-06-168:53 AM

## 2018-02-10 NOTE — Progress Notes (Signed)
Pt terminally extubated.  NG tube removed at this time at family request.  Pts Morphine gtt increased to 6mg /hr.  Pt appears comfortable at this time.  All other IV drips discontinued at this time.

## 2018-02-10 NOTE — Progress Notes (Signed)
Patient ID: Andrew [name], male   DOB: 07/31/1933, 84 y.o.   MRN: [id number] Pulmonary / Critical Care  Discussed with family multiple system organ failure, refractory sepsis, GI bleed, respiratory failure and ESRD. Wish to proceed with palliative extubation and comfort care approach. Will place in order set, IV morphine and as need ativan.   Hermelinda Dellen, DO

## 2018-02-10 NOTE — Progress Notes (Signed)
Shift report given to Quita Skye, rn.  Pt remains in nuclear med for bleeding scan.  Fentanyl, versed push handed off to Quita Skye, rn for sedation if needed.

## 2018-02-10 DEATH — deceased

## 2018-02-11 ENCOUNTER — Ambulatory Visit: Payer: Medicare Other | Admitting: Urology

## 2018-02-20 ENCOUNTER — Telehealth: Payer: Self-pay | Admitting: Urology

## 2018-02-20 NOTE — Telephone Encounter (Signed)
error 

## 2018-08-29 NOTE — Telephone Encounter (Signed)
error 

## 2019-04-13 IMAGING — MR MR FOOT*L* W/O CM
5 series · 30 of 40 positions shown · non-contrast
Comparison: Same day great toe radiographs.

CLINICAL DATA: Left foot and toe pain over the past few weeks.

EXAM:
MRI OF THE LEFT FOOT WITHOUT CONTRAST
TECHNIQUE: Multiplanar, multisequence MR imaging of the left mid and forefoot
was performed. No intravenous contrast was administered.

[Series 6: T1 · coronal · 3.0mm · 0.62mm/px · 8 of 51 slices shown (1 of 2)]
[im 1/51]
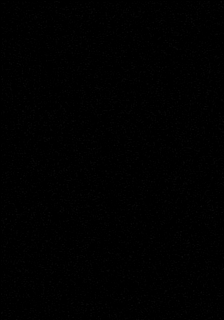
[im 6/51]
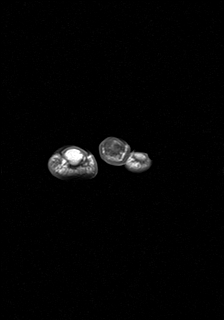
[im 17/51]
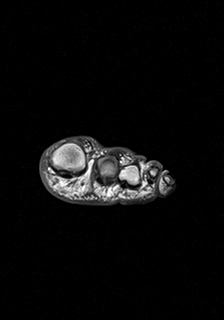
[im 23/51]
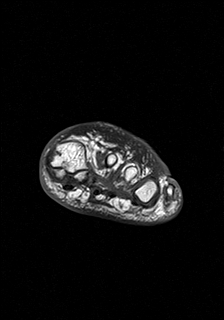
[im 28/51]
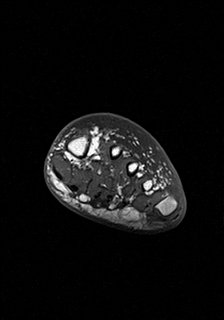
[im 34/51]
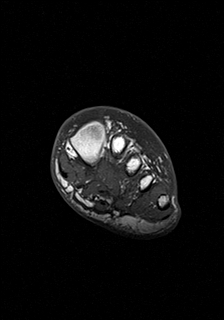
[im 45/51]
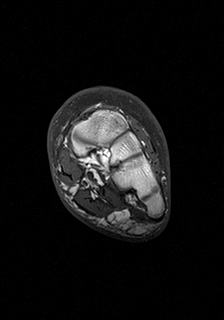
[im 51/51]
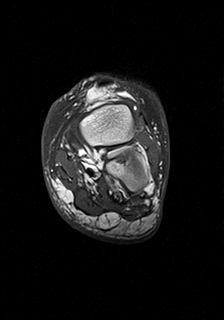

[Series 8: T1 · axial · 3.0mm · 0.62mm/px · z∈[-105,+3]mm · 7 of 35 slices shown (2 of 2)]
[im 1/35]
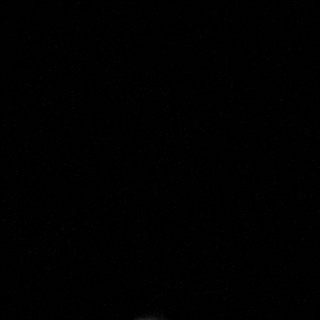
[im 6/35]
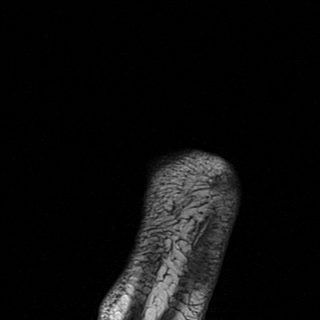
[im 12/35]
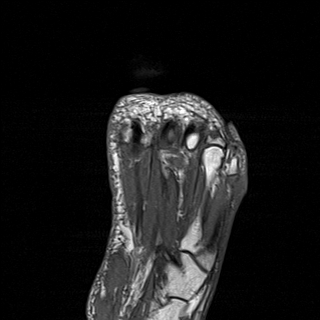
[im 18/35]
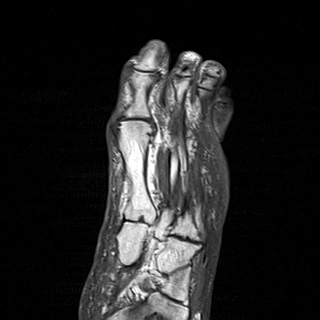
[im 23/35]
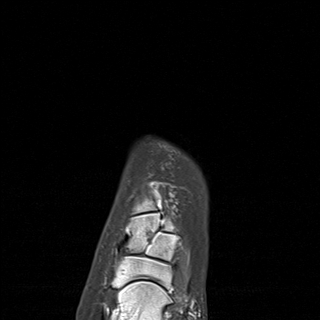
[im 29/35]
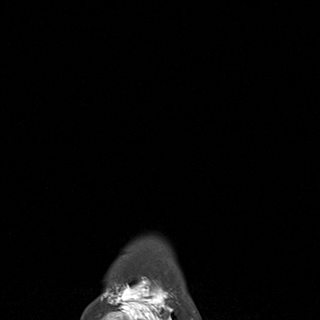
[im 35/35]
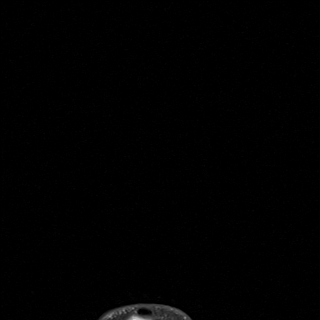

[Series 10: STIR · sagittal · 3.0mm · 0.39mm/px · 6 of 31 slices shown (1 of 2)]
[im 1/31]
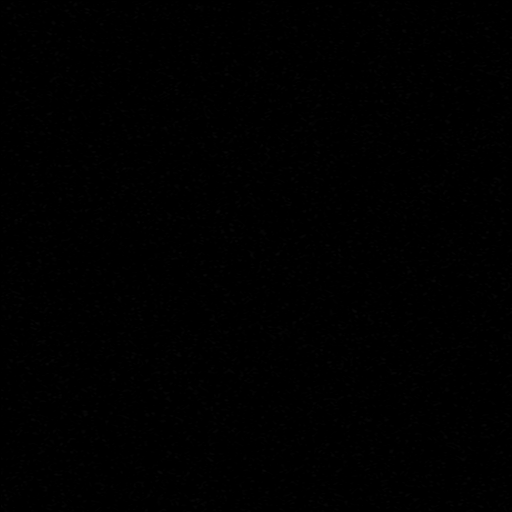
[im 7/31]
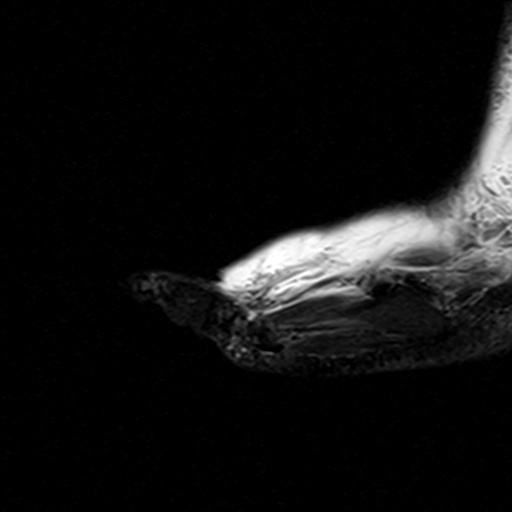
[im 13/31]
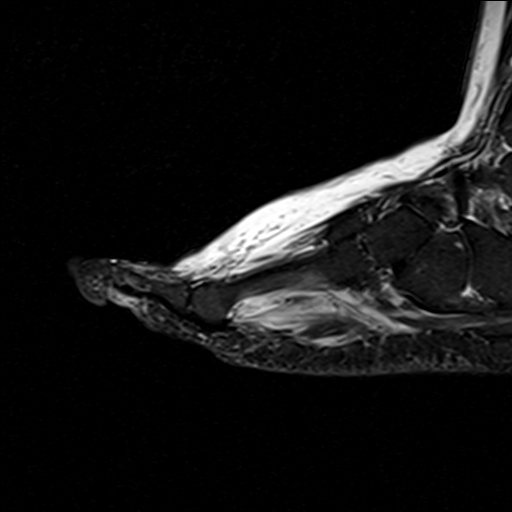
[im 19/31]
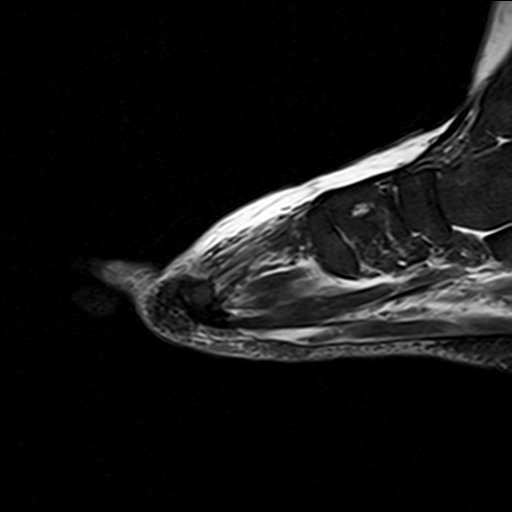
[im 25/31]
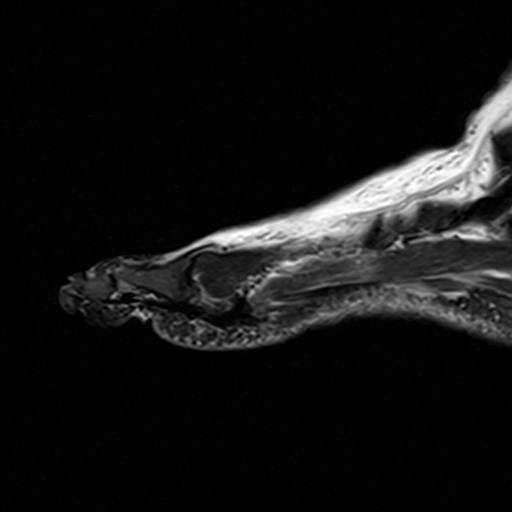
[im 31/31]
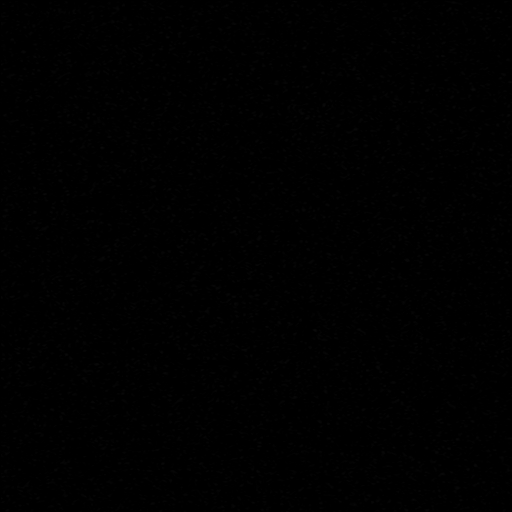

[Series 11: T2 fat-sat · coronal · 3.0mm · 0.39mm/px · 8 of 51 slices shown]
[im 1/51]
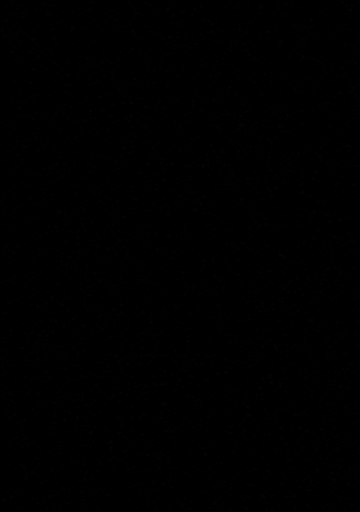
[im 6/51]
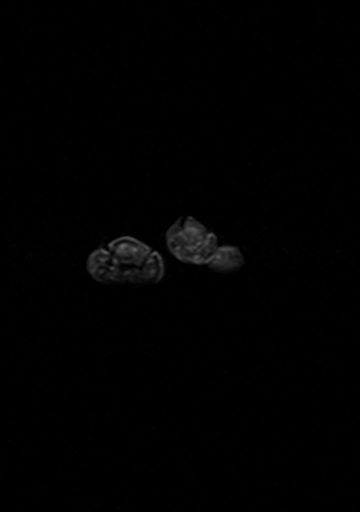
[im 17/51]
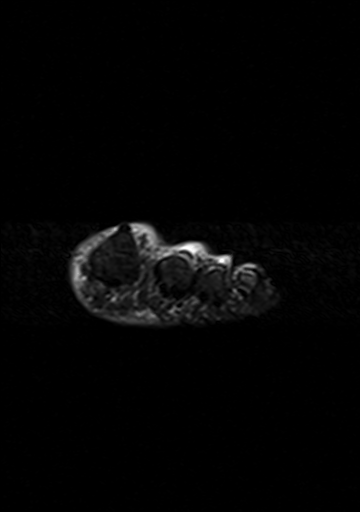
[im 23/51]
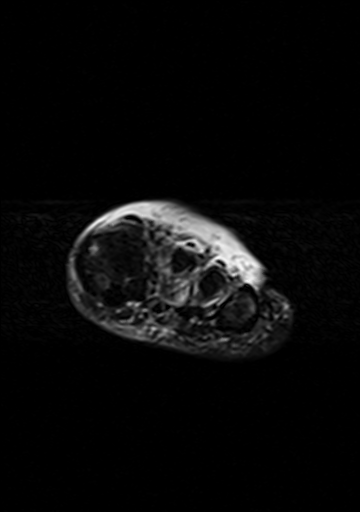
[im 28/51]
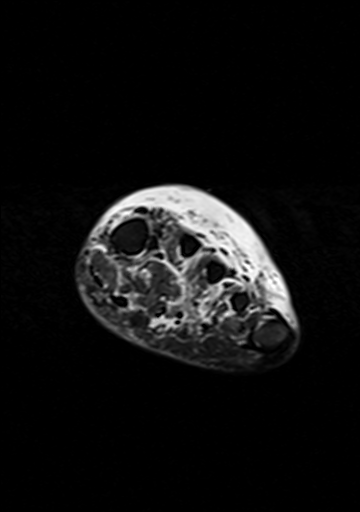
[im 34/51]
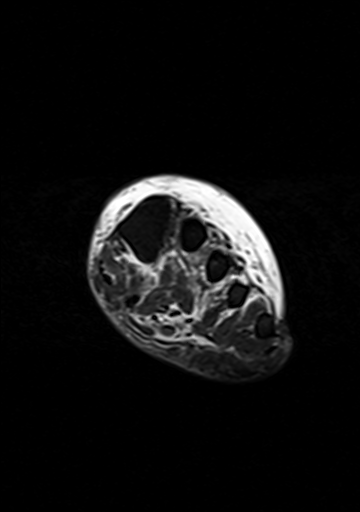
[im 45/51]
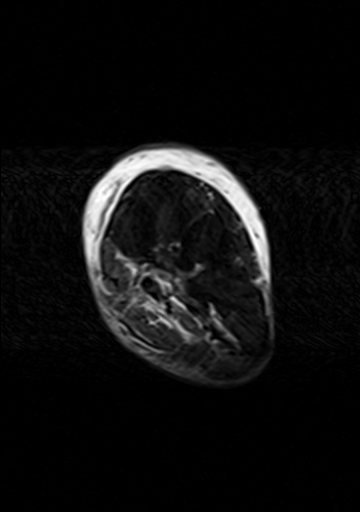
[im 51/51]
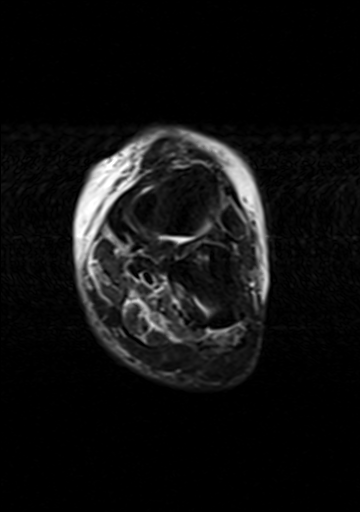

[Series 12: STIR · axial · 3.0mm · 0.39mm/px · 1 of 35 slices shown (2 of 2)]
[im 1/35]
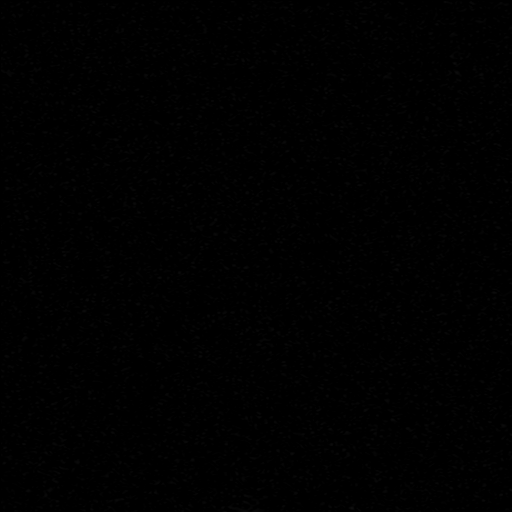

[30 of 40 positions shown; findings below may reference images not displayed]

FINDINGS: Bones/Joint/Cartilage

Osteoarthritis of the interphalangeal, first MTP and TMT joints of
the great toe. Associated subchondral degenerative cystic change is
noted involving the base and head of the first metatarsal and across
the interphalangeal joint of the great toe. Lesser degrees of
degenerative joint space narrowing of the DIP and PIP joints of the
second through fifth digits. Areas of minimal edema involving the
tuft of the great toe and cortical destruction of the middle phalanx
of the second toe with minimal edema raise concern for early changes
of osteomyelitis versus erosive changes possibly from an
inflammatory arthropathy.

Ligaments

Noncontributory

Muscles and Tendons

No intramuscular fluid collection or abscess. No significant muscle
atrophy. No evidence of a tenosynovitis. The tarsal

Soft tissues

Soft tissue edema and swelling is noted along the dorsum of the mid
and forefoot. No focal fluid collections are identified.
IMPRESSION: 1. Osteoarthritis of the forefoot as above more notably involving
the interphalangeal, first MTP and TMT joints with joint space
narrowing and subchondral cystic change.
2. Lesser degrees of osteoarthritic joint space narrowing is seen of
the DIP and PIP joints of the second through fifth digits and fourth
TMT joints.
3. Faint marrow edema involving the tuft of the great toe cannot
exclude posttraumatic or reactive edema versus early changes of an
osteomyelitis.
4. Cortical irregularity of the second middle phalangeal head with
slight marrow edema also raise concern for changes of osteomyelitis
although changes from inflammatory arthropathy may also have a
similar appearance. Correlate with dedicated foot and/or toe
radiographs.
5. Soft tissue swelling over the dorsum of the included foot.

## 2019-05-01 IMAGING — CR DG CHEST 2V
2 series · 2 of 2 positions shown · non-contrast
Comparison: 12/01/2017

CLINICAL DATA: Chest tightness with mild dyspnea.

EXAM:
CHEST - 2 VIEW

[chest lat]
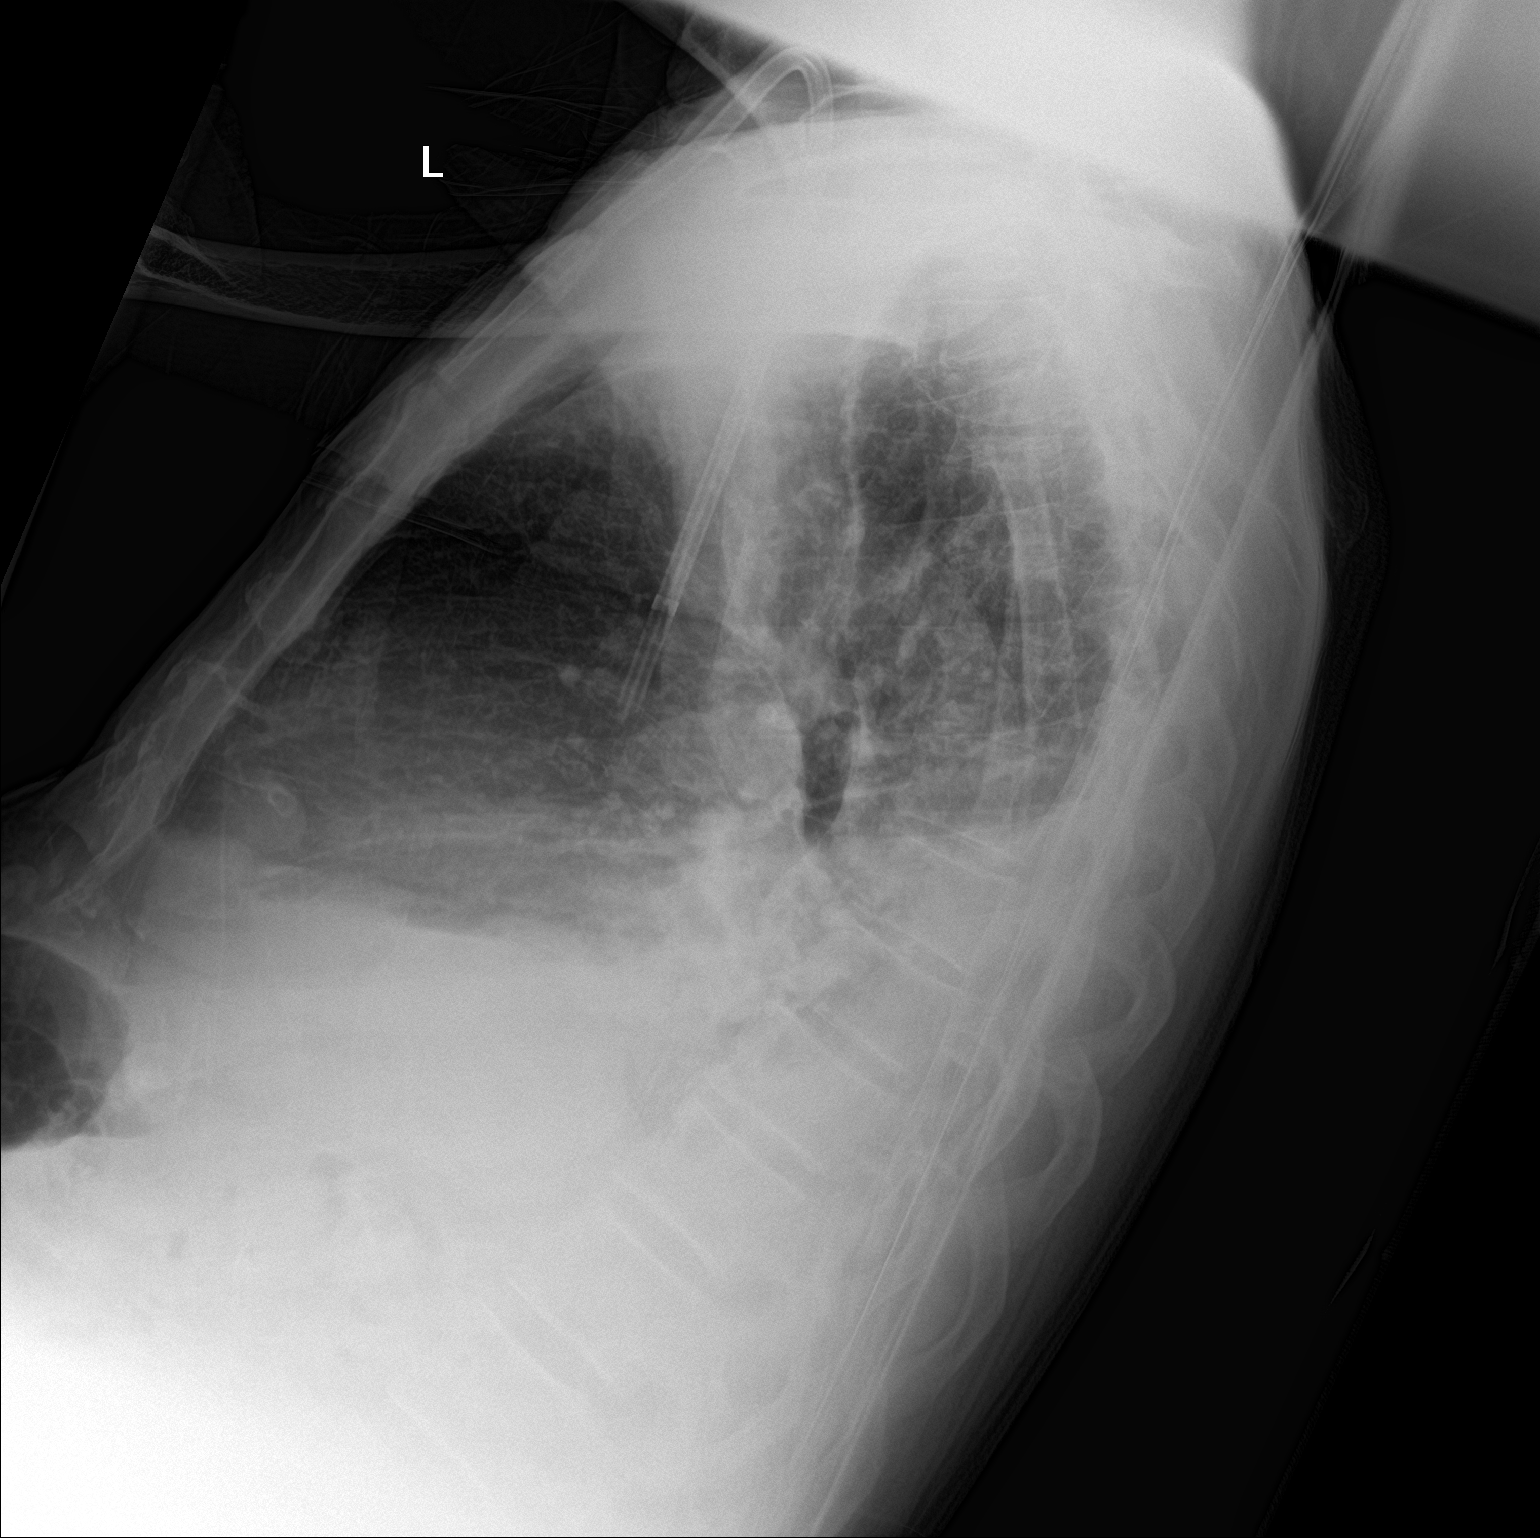

[chest ap]
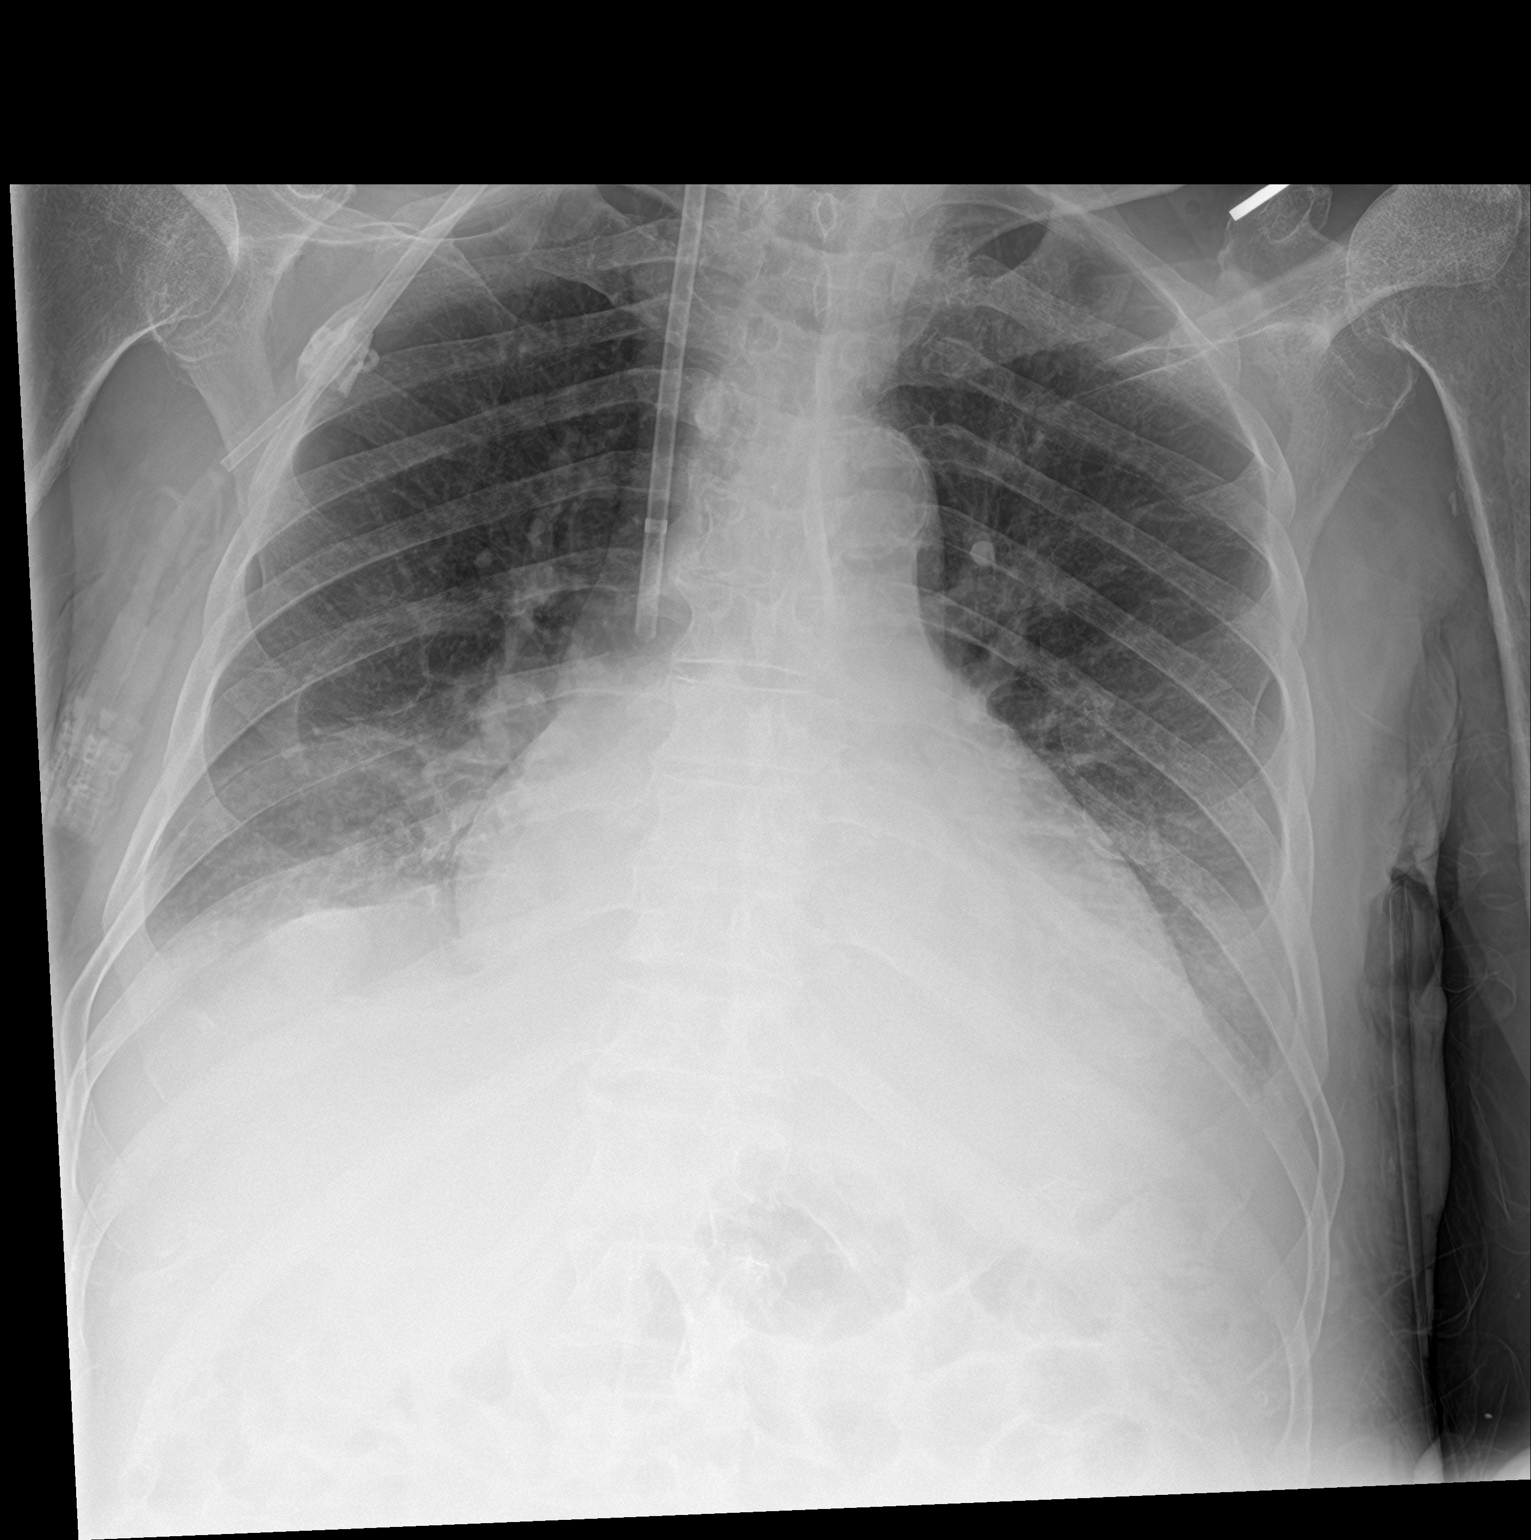

[2 of 2 positions shown; findings below may reference images not displayed]

FINDINGS: Stable cardiomegaly with moderate aortic atherosclerosis. New
bilateral small pleural effusions obscuring the diaphragms and
spanning approximately of 1-1/2 to 2 vertebral body heights on the
lateral view. New right IJ dialysis catheter tip terminates in the
distal SVC.
IMPRESSION: Cardiomegaly with new small bilateral pleural effusions. No overt
pulmonary edema. New right IJ dialysis catheter is noted without
apparent complication.

## 2019-05-28 IMAGING — DX DG ABDOMEN 1V
2 series · 2 of 2 positions shown · non-contrast
Comparison: Chest x-ray 01/13/2018

CLINICAL DATA: NG tube placement

EXAM:
ABDOMEN - 1 VIEW

[abdomen supine (1 of 2)]
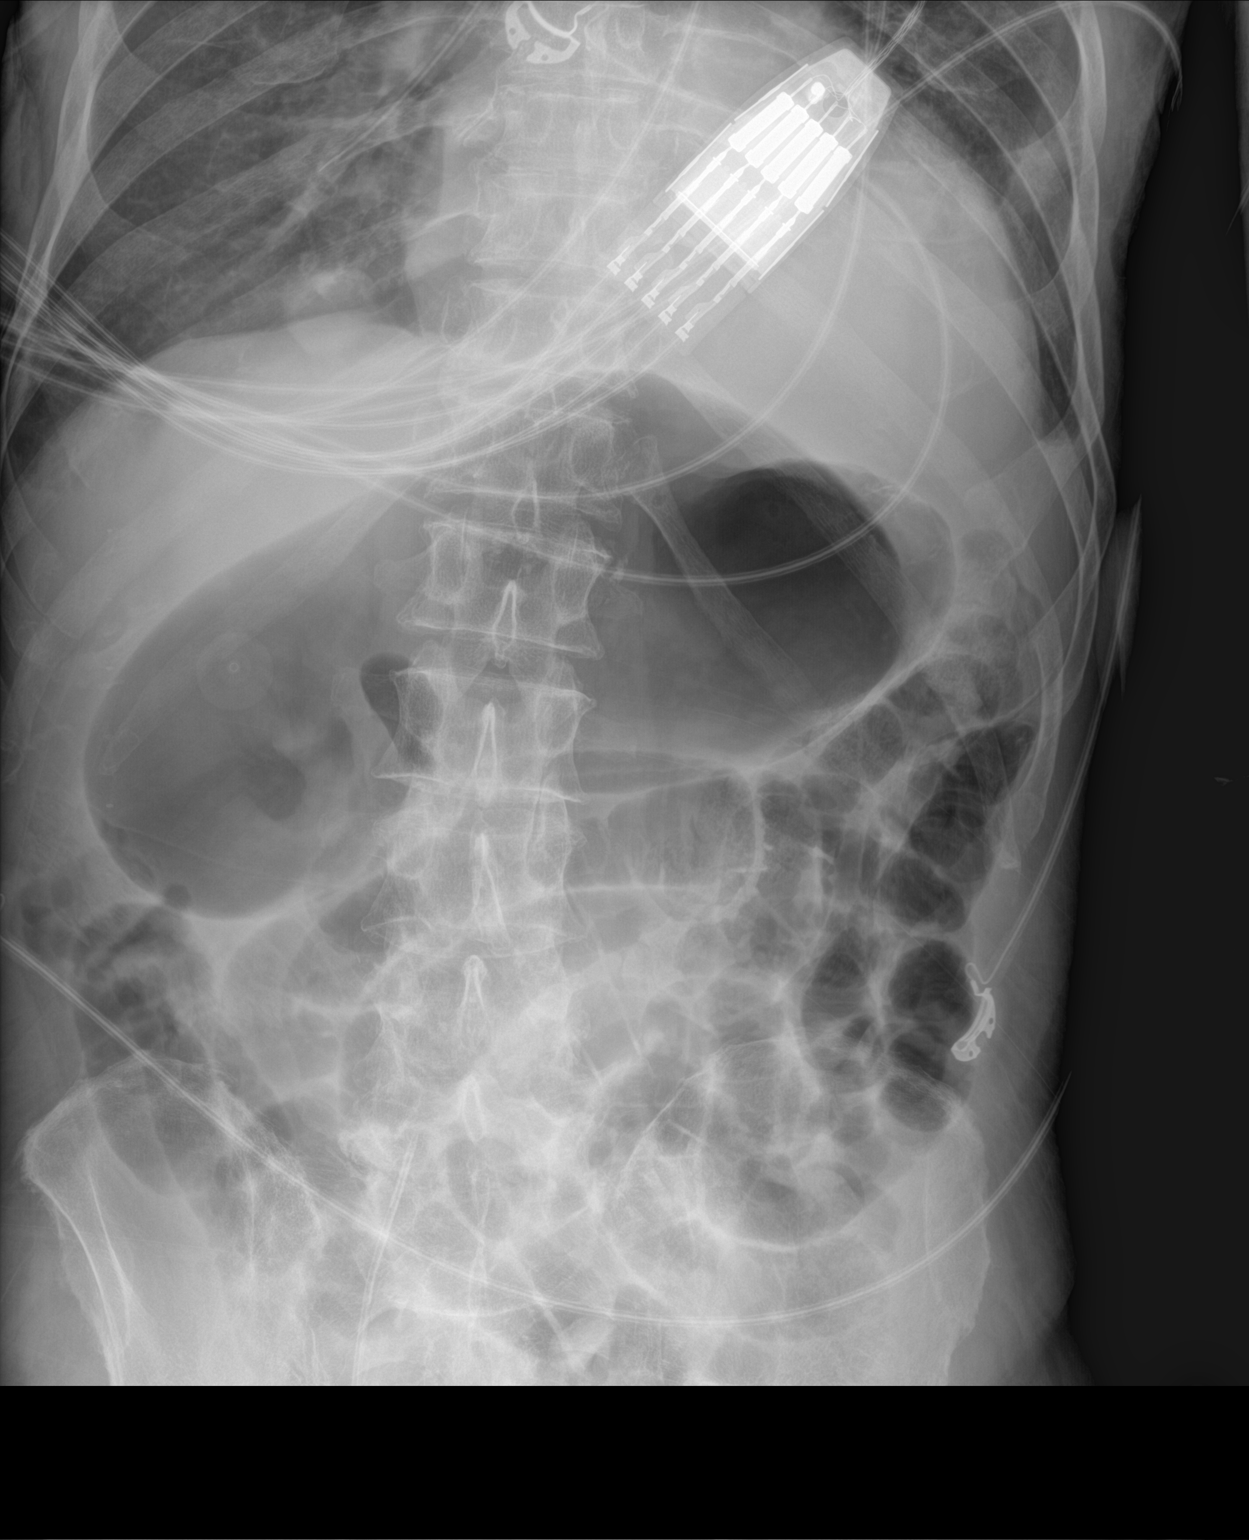

[abdomen supine (2 of 2)]
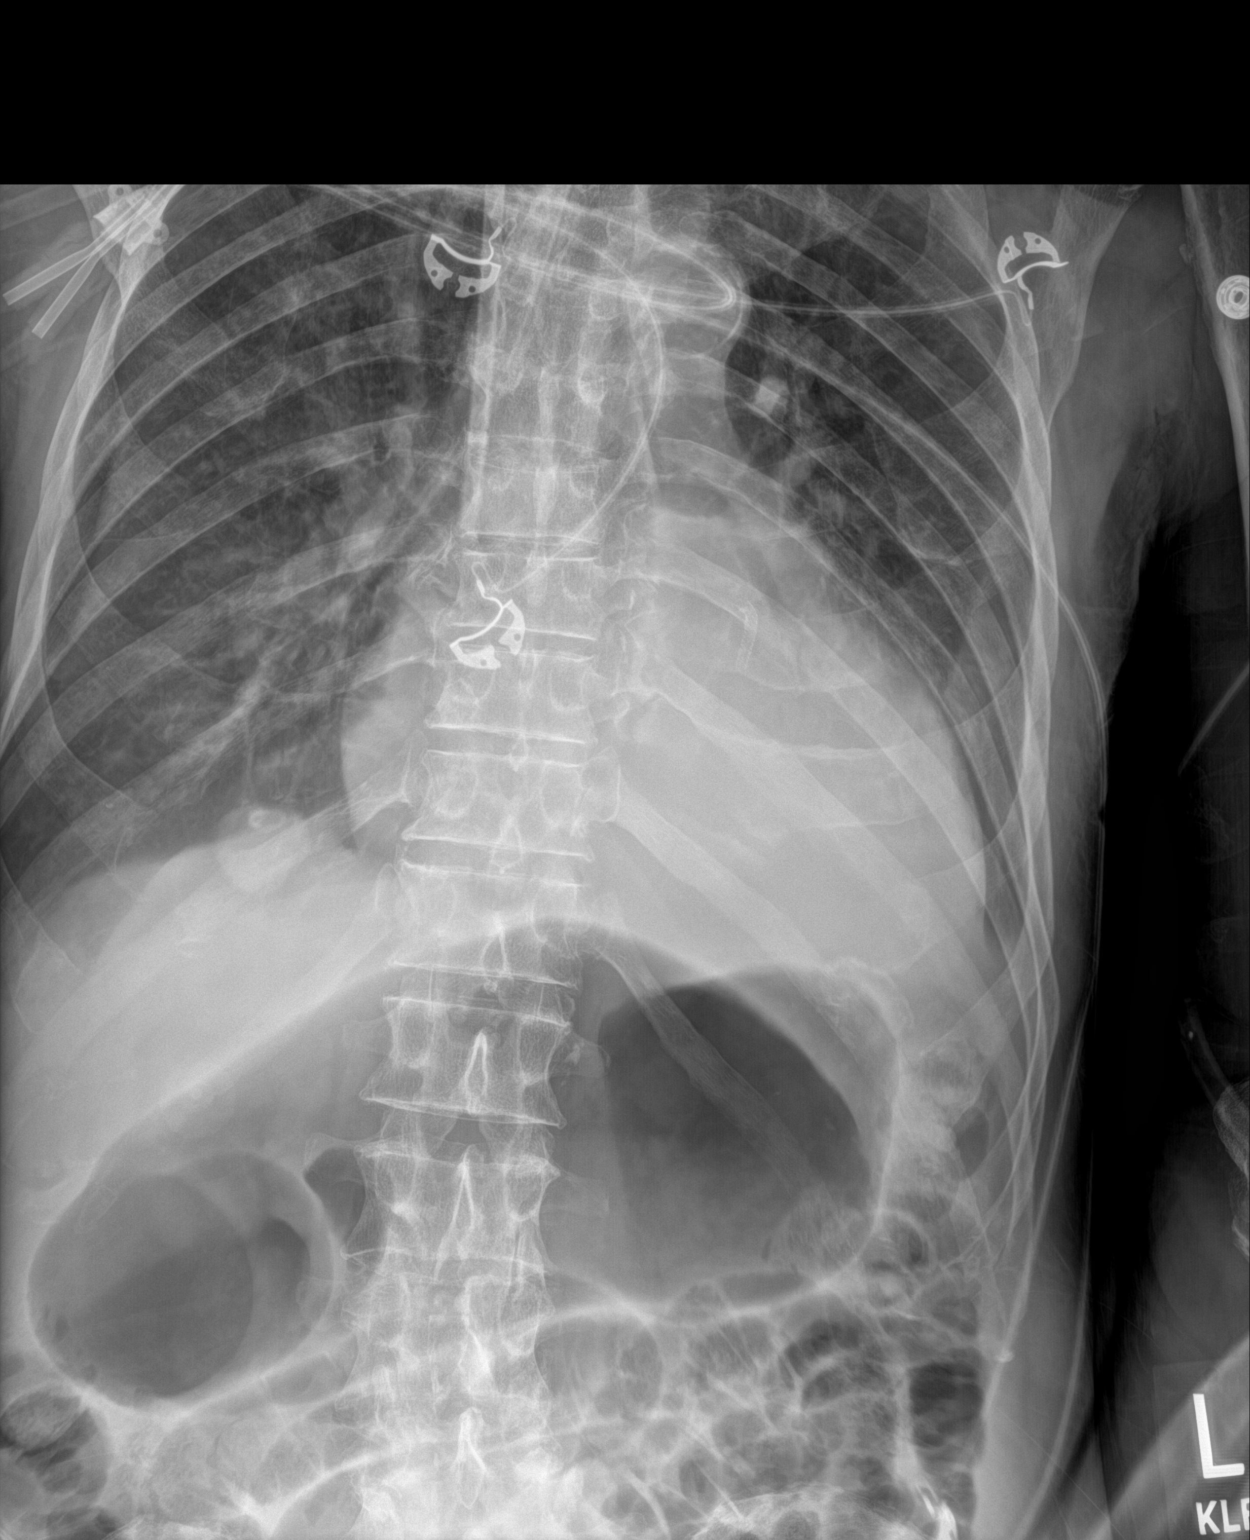

[2 of 2 positions shown; findings below may reference images not displayed]

FINDINGS: Nasogastric tube tip is identified within the RIGHT LOWER lobe
bronchus. Significant artifact overlies the LOWER chest.

There is persistent opacity in the LEFT LOWER lobe. There is gaseous
distension of the stomach. Large and small bowel loops appear normal
caliber. A RIGHT femoral line overlies L5.
IMPRESSION: 1. Nasogastric tube to the RIGHT LOWER lobe bronchus.
2. Gaseous distension of the stomach.
3. Increased opacity in the LEFT LOWER lobe.
4. These results were called by telephone at the time of
interpretation on 01/16/2018 at [DATE] to Dr. DEANNE TIGER , who
verbally acknowledged these results.
